# Patient Record
Sex: Male | Born: 1950
Health system: Southern US, Community
[De-identification: ages and names within clinical notes are randomized; demographics above are authoritative.]

## PROBLEM LIST (undated history)

## (undated) DIAGNOSIS — I639 Cerebral infarction, unspecified: Secondary | ICD-10-CM

## (undated) DIAGNOSIS — K635 Polyp of colon: Secondary | ICD-10-CM

## (undated) DIAGNOSIS — J439 Emphysema, unspecified: Secondary | ICD-10-CM

## (undated) DIAGNOSIS — E785 Hyperlipidemia, unspecified: Secondary | ICD-10-CM

## (undated) DIAGNOSIS — T7840XA Allergy, unspecified, initial encounter: Secondary | ICD-10-CM

## (undated) DIAGNOSIS — I10 Essential (primary) hypertension: Secondary | ICD-10-CM

## (undated) HISTORY — DX: Allergy, unspecified, initial encounter: T78.40XA

## (undated) HISTORY — DX: Hyperlipidemia, unspecified: E78.5

## (undated) HISTORY — DX: Cerebral infarction, unspecified: I63.9

## (undated) HISTORY — DX: Emphysema, unspecified: J43.9

## (undated) HISTORY — DX: Polyp of colon: K63.5

---

## 2001-02-10 ENCOUNTER — Emergency Department (HOSPITAL_COMMUNITY): Admission: EM | Admit: 2001-02-10 | Discharge: 2001-02-10 | Payer: Self-pay | Admitting: *Deleted

## 2007-02-08 ENCOUNTER — Emergency Department (HOSPITAL_COMMUNITY): Admission: EM | Admit: 2007-02-08 | Discharge: 2007-02-08 | Payer: Self-pay | Admitting: Emergency Medicine
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
## 2008-06-28 HISTORY — PX: COLONOSCOPY: SHX174

## 2008-10-14 ENCOUNTER — Ambulatory Visit: Payer: Self-pay | Admitting: Gastroenterology

## 2012-02-28 ENCOUNTER — Encounter (HOSPITAL_COMMUNITY): Payer: Self-pay | Admitting: *Deleted

## 2012-02-28 ENCOUNTER — Emergency Department (HOSPITAL_COMMUNITY)
Admission: EM | Admit: 2012-02-28 | Discharge: 2012-02-29 | Disposition: A | Discharge status: Home / Self Care | Payer: 59 | Attending: Emergency Medicine | Admitting: Emergency Medicine

## 2012-02-28 DIAGNOSIS — I10 Essential (primary) hypertension: Secondary | ICD-10-CM | POA: Insufficient documentation

## 2012-02-28 DIAGNOSIS — R040 Epistaxis: Secondary | ICD-10-CM | POA: Insufficient documentation

## 2012-02-28 DIAGNOSIS — F17200 Nicotine dependence, unspecified, uncomplicated: Secondary | ICD-10-CM | POA: Insufficient documentation

## 2012-02-28 DIAGNOSIS — E119 Type 2 diabetes mellitus without complications: Secondary | ICD-10-CM | POA: Insufficient documentation

## 2012-02-28 HISTORY — DX: Essential (primary) hypertension: I10

## 2012-02-28 LAB — COMPREHENSIVE METABOLIC PANEL
ALT: 67 U/L — ABNORMAL HIGH (ref 0–53)
AST: 43 U/L — ABNORMAL HIGH (ref 0–37)
Albumin: 3.7 g/dL (ref 3.5–5.2)
Alkaline Phosphatase: 86 U/L (ref 39–117)
BUN: 16 mg/dL (ref 6–23)
CO2: 25 mEq/L (ref 19–32)
Calcium: 9.2 mg/dL (ref 8.4–10.5)
Chloride: 102 mEq/L (ref 96–112)
Creatinine, Ser: 1.02 mg/dL (ref 0.50–1.35)
GFR calc Af Amer: 90 mL/min — ABNORMAL LOW (ref >90)
GFR calc non Af Amer: 77 mL/min — ABNORMAL LOW (ref >90)
Glucose, Bld: 119 mg/dL — ABNORMAL HIGH (ref 70–99)
Potassium: 4.3 mEq/L (ref 3.5–5.1)
Sodium: 135 mEq/L (ref 135–145)
Total Bilirubin: 0.4 mg/dL (ref 0.3–1.2)
Total Protein: 7.2 g/dL (ref 6.0–8.3)

## 2012-02-28 LAB — CBC WITH DIFFERENTIAL/PLATELET
Basophils Absolute: 0 10*3/uL (ref 0.0–0.1)
Basophils Relative: 0 % (ref 0–1)
Eosinophils Absolute: 0.1 10*3/uL (ref 0.0–0.7)
Eosinophils Relative: 2 % (ref 0–5)
HCT: 45.1 % (ref 39.0–52.0)
Hemoglobin: 14.7 g/dL (ref 13.0–17.0)
Lymphocytes Relative: 32 % (ref 12–46)
Lymphs Abs: 2.2 10*3/uL (ref 0.7–4.0)
MCH: 28.5 pg (ref 26.0–34.0)
MCHC: 32.6 g/dL (ref 30.0–36.0)
MCV: 87.4 fL (ref 78.0–100.0)
Monocytes Absolute: 0.6 10*3/uL (ref 0.1–1.0)
Monocytes Relative: 9 % (ref 3–12)
Neutro Abs: 3.8 10*3/uL (ref 1.7–7.7)
Neutrophils Relative %: 57 % (ref 43–77)
Platelets: 154 10*3/uL (ref 150–400)
RBC: 5.16 MIL/uL (ref 4.22–5.81)
RDW: 13.3 % (ref 11.5–15.5)
WBC: 6.7 10*3/uL (ref 4.0–10.5)

## 2012-02-28 NOTE — ED Notes (Signed)
No epistaxis at this time.

## 2012-02-28 NOTE — ED Notes (Signed)
The pt has had a high a1c in the past.  His blood has not been checked in a long time

## 2012-02-28 NOTE — ED Notes (Signed)
The pt has had intermittent nose bleeds since February.  No bleeding now.

## 2012-02-29 MED ORDER — SALINE NASAL SPRAY 0.65 % NA SOLN: 1.0000 | NASAL | Status: DC | PRN

## 2012-02-29 NOTE — ED Notes (Signed)
Pt c/o intermittent nosebleeds x 2 months.  States he usually gets them 1-2 x week.  Tonight, lasted for 30 minutes and blood was draining down throat, causing him to cough.  No bleeding at this time.  Pt states he does have HTN, not on meds at this time, does not regularly check BP.  Family concerned patient's nosebleeds are related to stress.

## 2012-02-29 NOTE — ED Provider Notes (Signed)
History     CSN: [id number]  Arrival date & time 02/28/12  2150   First MD Initiated Contact with Patient 02/28/12 2328      Chief Complaint  Patient presents with  . Epistaxis    (Consider location/radiation/quality/duration/timing/severity/associated sxs/prior treatment) HPI The patient presents due to concerns of recurrent epistaxis.  He states that over the past 7 months he is to multiple episodes of epistaxis.  Episodes typically last for minutes to hours, resolving spontaneously or with pressure.  Throughout that time.  He notes that these seem to occur frequently while he is traveling, for work, though they occur in all locations. He denies any other events during his, including syncope, chest pain, dyspnea, fever, chills, confusion, visual changes. He has a history of hypertension, for which is not taking medication currently, denies other significant medical issues.  The patient presents today 2 to a prolonged episode that was otherwise characteristically the same.  This episode began in the hours prior to arrival, stopped before my evaluation. Past Medical History  Diagnosis Date  . Hypertension   . Diabetes mellitus     History reviewed. No pertinent past surgical history.  No family history on file.  History  Substance Use Topics  . Smoking status: Current Everyday Smoker  . Smokeless tobacco: Not on file  . Alcohol Use: Yes      Review of Systems  Constitutional:       Per HPI, otherwise negative  HENT:       Per HPI, otherwise negative  Eyes: Negative.   Respiratory:       Per HPI, otherwise negative  Cardiovascular:       Per HPI, otherwise negative  Gastrointestinal: Negative for vomiting.  Genitourinary: Negative.   Musculoskeletal:       Per HPI, otherwise negative  Skin: Negative.   Neurological: Negative for syncope.    Allergies  Review of patient's allergies indicates no known allergies.  Home Medications   Current Outpatient Rx    Name Route Sig Dispense Refill  . ASPIRIN 325 MG PO TABS Oral Take 325 mg by mouth daily.    . OCUVITE PO TABS Oral Take 1 tablet by mouth daily.    [name] KRILL OIL 1000 MG PO CAPS Oral Take 1 capsule by mouth daily.    [name] PLANT STEROLS AND STANOLS 450 MG PO TABS Oral Take 1 tablet by mouth 2 (two) times daily.    [name] SALINE NASAL SPRAY 0.65 % NA SOLN Nasal Place 1 spray into the nose as needed for congestion. 30 mL 12    BP 139/83  Pulse 82  Temp 98.3 F (36.8 C) (Oral)  Resp 18  SpO2 100%  Physical Exam  Nursing note and vitals reviewed. Constitutional: He is oriented to person, place, and time. He appears well-developed. No distress.  HENT:  Head: Normocephalic and atraumatic.  Nose: No mucosal edema, rhinorrhea, sinus tenderness, nasal deformity, septal deviation or nasal septal hematoma. No epistaxis.       Within the nostrils there is no active bleeding.  On the left on the medial distal most visible aspect there is a scabbed over lesion.  On the right on the medial anterior, there is a scant amount of dried blood.  Eyes: Conjunctivae and EOM are normal.  Cardiovascular: Normal rate and regular rhythm.   Pulmonary/Chest: Effort normal. No stridor. No respiratory distress.  Abdominal: He exhibits no distension.  Musculoskeletal: He exhibits no edema.  Neurological: He is alert and  oriented to person, place, and time.  Skin: Skin is warm and dry.  Psychiatric: He has a normal mood and affect.    ED Course  Procedures (including critical care time)  Labs Reviewed  COMPREHENSIVE METABOLIC PANEL - Abnormal; Notable for the following:    Glucose, Bld 119 (*)     AST 43 (*)     ALT 67 (*)     GFR calc non Af Amer 77 (*)     GFR calc Af Amer 90 (*)     All other components within normal limits  CBC WITH DIFFERENTIAL  PROTIME-INR   No results found.   1. Epistaxis       MDM  This generally well-appearing male presents with concern of months of nosebleed, and a new,  prolonged episode just prior to arrival.  On arrival the patient's bleeding has stopped, and on exam he is no active bleeding.  Given the patient's description of chronic bleeds, there is some suspicion of hypertension or coagulopathy, though the patient's labs are largely reassuring.  We discussed at length the necessity for continued management with ENT, preventative methods for epistaxis control, and the patient was discharged in stable condition as he was traveling later this morning.  He was provided saline drops for prophylaxis while flying.     Gerhard Munch, MD 02/29/12 [phone]

## 2012-02-29 NOTE — ED Notes (Signed)
Rx x 1.  Pt voiced understanding to f/u with PCP and ENT and return for worsening condition.

## 2013-01-11 ENCOUNTER — Emergency Department (HOSPITAL_COMMUNITY)
Admission: EM | Admit: 2013-01-11 | Discharge: 2013-01-11 | Disposition: A | Discharge status: Home / Self Care | Payer: 59 | Attending: Emergency Medicine | Admitting: Emergency Medicine

## 2013-01-11 ENCOUNTER — Encounter (HOSPITAL_COMMUNITY): Payer: Self-pay

## 2013-01-11 ENCOUNTER — Ambulatory Visit (INDEPENDENT_AMBULATORY_CARE_PROVIDER_SITE_OTHER): Payer: 59 | Admitting: Emergency Medicine

## 2013-01-11 VITALS — BP 156/100 | HR 94 | Temp 97.8°F | Resp 20 | Ht 68.0 in | Wt 215.4 lb

## 2013-01-11 DIAGNOSIS — R51 Headache: Secondary | ICD-10-CM | POA: Insufficient documentation

## 2013-01-11 DIAGNOSIS — I10 Essential (primary) hypertension: Secondary | ICD-10-CM | POA: Insufficient documentation

## 2013-01-11 DIAGNOSIS — Z79899 Other long term (current) drug therapy: Secondary | ICD-10-CM | POA: Insufficient documentation

## 2013-01-11 DIAGNOSIS — H538 Other visual disturbances: Secondary | ICD-10-CM | POA: Insufficient documentation

## 2013-01-11 DIAGNOSIS — R040 Epistaxis: Secondary | ICD-10-CM

## 2013-01-11 DIAGNOSIS — E669 Obesity, unspecified: Secondary | ICD-10-CM

## 2013-01-11 DIAGNOSIS — F17200 Nicotine dependence, unspecified, uncomplicated: Secondary | ICD-10-CM | POA: Insufficient documentation

## 2013-01-11 DIAGNOSIS — E1169 Type 2 diabetes mellitus with other specified complication: Secondary | ICD-10-CM | POA: Insufficient documentation

## 2013-01-11 DIAGNOSIS — E119 Type 2 diabetes mellitus without complications: Secondary | ICD-10-CM

## 2013-01-11 DIAGNOSIS — Z7982 Long term (current) use of aspirin: Secondary | ICD-10-CM | POA: Insufficient documentation

## 2013-01-11 DIAGNOSIS — E291 Testicular hypofunction: Secondary | ICD-10-CM

## 2013-01-11 DIAGNOSIS — E782 Mixed hyperlipidemia: Secondary | ICD-10-CM

## 2013-01-11 DIAGNOSIS — M7090 Unspecified soft tissue disorder related to use, overuse and pressure of unspecified site: Secondary | ICD-10-CM

## 2013-01-11 DIAGNOSIS — R739 Hyperglycemia, unspecified: Secondary | ICD-10-CM

## 2013-01-11 LAB — LIPID PANEL
Cholesterol: 236 mg/dL — ABNORMAL HIGH (ref 0–200)
HDL: 41 mg/dL (ref >39)
Total CHOL/HDL Ratio: 5.8 Ratio
Triglycerides: 134 mg/dL (ref <150)

## 2013-01-11 LAB — COMPREHENSIVE METABOLIC PANEL
AST: 64 U/L — ABNORMAL HIGH (ref 0–37)
BUN: 12 mg/dL (ref 6–23)
Calcium: 9.5 mg/dL (ref 8.4–10.5)
Chloride: 100 mEq/L (ref 96–112)
Creat: 0.96 mg/dL (ref 0.50–1.35)
Glucose, Bld: 167 mg/dL — ABNORMAL HIGH (ref 70–99)

## 2013-01-11 LAB — POCT CBC
Lymph, poc: 2 (ref 0.6–3.4)
MCH, POC: 28.4 pg (ref 27–31.2)
MCHC: 31.3 g/dL — AB (ref 31.8–35.4)
MID (cbc): 0.5 (ref 0–0.9)
MPV: 11.7 fL (ref 0–99.8)
POC MID %: 8.9 %M (ref 0–12)
Platelet Count, POC: 140 10*3/uL — AB (ref 142–424)
WBC: 6.1 10*3/uL (ref 4.6–10.2)

## 2013-01-11 LAB — POCT URINALYSIS DIPSTICK
Bilirubin, UA: NEGATIVE
Nitrite, UA: NEGATIVE
Spec Grav, UA: <=1.005
Urobilinogen, UA: 1
pH, UA: 5.5

## 2013-01-11 LAB — TESTOSTERONE: Testosterone: 250 ng/dL — ABNORMAL LOW (ref 300–890)

## 2013-01-11 LAB — GLUCOSE, CAPILLARY

## 2013-01-11 MED ORDER — TESTOSTERONE 20.25 MG/1.25GM (1.62%) TD GEL: 4.0000 | Freq: Every day | TRANSDERMAL | Status: DC

## 2013-01-11 MED ORDER — ROSUVASTATIN CALCIUM 20 MG PO TABS: 20.0000 mg | ORAL_TABLET | Freq: Every day | ORAL | Status: DC

## 2013-01-11 MED ORDER — METFORMIN HCL ER 500 MG PO TB24: ORAL_TABLET | ORAL | Status: DC

## 2013-01-11 MED ORDER — LISINOPRIL 20 MG PO TABS: 20.0000 mg | ORAL_TABLET | Freq: Every day | ORAL | Status: DC

## 2013-01-11 NOTE — ED Provider Notes (Signed)
History    CSN: [id number] Arrival date & time 01/11/13  1225  First MD Initiated Contact with Patient 01/11/13 1244     Chief Complaint  Patient presents with  . Dehydration   (Consider location/radiation/quality/duration/timing/severity/associated sxs/prior Treatment) HPI Pt seen in urgent care for epistaxis and started on lisinopril due to elevated BP. Pt states he does have PMD but has not followed up with him in several years and has gained weight. Pt get intermittent R sided HA with blurred vision. Staes HA are mild and had one this afternoon that is all but resolved at this time. No dizziness, N/V, fever chills, CP, sob, focal weakness. Pt took BP med earlier today. Urinating normally.  Past Medical History  Diagnosis Date  . Hypertension   . Diabetes mellitus   . Allergy    History reviewed. No pertinent past surgical history. No family history on file. History  Substance Use Topics  . Smoking status: Current Every Day Smoker -- 0.50 packs/day for 40 years    Types: Cigarettes  . Smokeless tobacco: Not on file  . Alcohol Use: Yes    Review of Systems  Constitutional: Negative for fever and chills.  HENT: Negative for neck pain.   Eyes: Positive for visual disturbance.  Respiratory: Negative for chest tightness and shortness of breath.   Cardiovascular: Negative for chest pain.  Gastrointestinal: Negative for nausea, vomiting, abdominal pain, diarrhea and abdominal distention.  Genitourinary: Negative for dysuria, frequency and difficulty urinating.  Musculoskeletal: Negative for back pain.  Skin: Negative for rash and wound.  Neurological: Positive for headaches. Negative for dizziness, syncope, weakness, light-headedness and numbness.  All other systems reviewed and are negative.    Allergies  Review of patient's allergies indicates no known allergies.  Home Medications   Current Outpatient Rx  Name  Route  Sig  Dispense  Refill  . aspirin 81 MG tablet   Oral   Take 81 mg by mouth daily.         . beta carotene w/minerals (OCUVITE) tablet   Oral   Take 1 tablet by mouth daily.         [name] KRILL OIL 1000 MG CAPS   Oral   Take 1 capsule by mouth daily.         [name] lisinopril (PRINIVIL,ZESTRIL) 20 MG tablet   Oral   Take 1 tablet (20 mg total) by mouth daily.   30 tablet   1   . Plant Sterols and Stanols (CHOLEST OFF) 450 MG TABS   Oral   Take 1 tablet by mouth 2 (two) times daily.         . sodium chloride (OCEAN NASAL SPRAY) 0.65 % nasal spray   Nasal   Place 1 spray into the nose as needed for congestion.   30 mL   12    BP 170/100  Pulse 115  Temp(Src) 98.1 F (36.7 C) (Oral)  Resp 20  SpO2 97% Physical Exam  Nursing note and vitals reviewed. Constitutional: He is oriented to person, place, and time. He appears well-developed and well-nourished. No distress.  HENT:  Head: Normocephalic and atraumatic.  Mouth/Throat: Oropharynx is clear and moist.  Eyes: EOM are normal. Pupils are equal, round, and reactive to light.  Neck: Normal range of motion. Neck supple.  Cardiovascular: Normal rate and regular rhythm.   Pulmonary/Chest: Effort normal and breath sounds normal. No respiratory distress. He has no wheezes. He has no rales.  Abdominal: Soft. Bowel sounds  are normal. He exhibits no distension and no mass. There is no tenderness. There is no rebound and no guarding.  Musculoskeletal: Normal range of motion. He exhibits no edema and no tenderness.  Neurological: He is alert and oriented to person, place, and time.  5/5 motor in all ext, sensation intact, normal gait.   Skin: Skin is warm and dry. No rash noted. No erythema.  Psychiatric: He has a normal mood and affect. His behavior is normal.    ED Course  Procedures (including critical care time) Labs Reviewed  GLUCOSE, CAPILLARY - Abnormal; Notable for the following:    Glucose-Capillary 269 (*)    All other components within normal limits   No  results found. 1. Hypertension   2. Hyperglycemia   3. Headache     MDM  Stressed to pt need for outpt follow up with PMD for blood pressure and blood glucose management. Pt has been given return precautions. SBP in 140's and decreased HR below 100. Pt does not appear to be clinically dehydrated. Encouraged staying well hydrated drinking plenty of fluids, weight loss and exercise. Return precautions given.   Loren Racer, MD 01/11/13 1324

## 2013-01-11 NOTE — Progress Notes (Signed)
  Subjective:    Patient ID: Robert Robertson, male    DOB: May 25, 1951, 62 y.o.   MRN: [id number]  HPI    Review of Systems     Objective:   Physical Exam        Assessment & Plan:

## 2013-01-11 NOTE — Patient Instructions (Signed)
Nosebleed  Nosebleeds can be caused by many conditions including trauma, infections, polyps, foreign bodies, dry mucous membranes or climate, medications and air conditioning. Most nosebleeds occur in the front of the nose. It is because of this location that most nosebleeds can be controlled by pinching the nostrils gently and continuously. Do this for at least 10 to 20 minutes. The reason for this long continuous pressure is that you must hold it long enough for the blood to clot. If during that 10 to 20 minute time period, pressure is released, the process may have to be started again. The nosebleed may stop by itself, quit with pressure, need concentrated heating (cautery) or stop with pressure from packing.  HOME CARE INSTRUCTIONS    If your nose was packed, try to maintain the pack inside until your caregiver removes it. If a gauze pack was used and it starts to fall out, gently replace or cut the end off. Do not cut if a balloon catheter was used to pack the nose. Otherwise, do not remove unless instructed.   Avoid blowing your nose for 12 hours after treatment. This could dislodge the pack or clot and start bleeding again.   If the bleeding starts again, sit up and bending forward, gently pinch the front half of your nose continuously for 20 minutes.   If bleeding was caused by dry mucous membranes, cover the inside of your nose every morning with a petroleum or antibiotic ointment. Use your little fingertip as an applicator. Do this as needed during dry weather. This will keep the mucous membranes moist and allow them to heal.   Maintain humidity in your home by using less air conditioning or using a humidifier.   Do not use aspirin or medications which make bleeding more likely. Your caregiver can give you recommendations on this.   Resume normal activities as able but try to avoid straining, lifting or bending at the waist for several days.   If the nosebleeds become recurrent and the cause is  unknown, your caregiver may suggest laboratory tests.  SEEK IMMEDIATE MEDICAL CARE IF:    Bleeding recurs and cannot be controlled.   There is unusual bleeding from or bruising on other parts of the body.   You have a fever.   Nosebleeds continue.   There is any worsening of the condition which originally brought you in.   You become lightheaded, feel faint, become sweaty or vomit blood.  MAKE SURE YOU:    Understand these instructions.   Will watch your condition.   Will get help right away if you are not doing well or get worse.  Document Released: 03/24/2005 Document Revised: 09/06/2011 Document Reviewed: 05/16/2009  Exit[employer] Patient Information 2014 [employer]  Hypertension  As your heart beats, it forces blood through your arteries. This force is your blood pressure. If the pressure is too high, it is called hypertension (HTN) or high blood pressure. HTN is dangerous because you may have it and not know it. High blood pressure may mean that your heart has to work harder to pump blood. Your arteries may be narrow or stiff. The extra work puts you at risk for heart disease, stroke, and other problems.   Blood pressure consists of two numbers, a higher number over a lower, 110/72, for example. It is stated as "110 over 72." The ideal is below 120 for the top number (systolic) and under 80 for the bottom (diastolic). Write down your blood pressure today.  You   should pay close attention to your blood pressure if you have certain conditions such as:   Heart failure.   Prior heart attack.   Diabetes   Chronic kidney disease.   Prior stroke.   Multiple risk factors for heart disease.  To see if you have HTN, your blood pressure should be measured while you are seated with your arm held at the level of the heart. It should be measured at least twice. A one-time elevated blood pressure reading (especially in the Emergency Department) does not mean that you need treatment. There may be conditions in  which the blood pressure is different between your right and left arms. It is important to see your caregiver soon for a recheck.  Most people have essential hypertension which means that there is not a specific cause. This type of high blood pressure may be lowered by changing lifestyle factors such as:   Stress.   Smoking.   Lack of exercise.   Excessive weight.   Drug/tobacco/alcohol use.   Eating less salt.  Most people do not have symptoms from high blood pressure until it has caused damage to the body. Effective treatment can often prevent, delay or reduce that damage.  TREATMENT   When a cause has been identified, treatment for high blood pressure is directed at the cause. There are a large number of medications to treat HTN. These fall into several categories, and your caregiver will help you select the medicines that are best for you. Medications may have side effects. You should review side effects with your caregiver.  If your blood pressure stays high after you have made lifestyle changes or started on medicines,    Your medication(s) may need to be changed.   Other problems may need to be addressed.   Be certain you understand your prescriptions, and know how and when to take your medicine.   Be sure to follow up with your caregiver within the time frame advised (usually within two weeks) to have your blood pressure rechecked and to review your medications.   If you are taking more than one medicine to lower your blood pressure, make sure you know how and at what times they should be taken. Taking two medicines at the same time can result in blood pressure that is too low.  SEEK IMMEDIATE MEDICAL CARE IF:   You develop a severe headache, blurred or changing vision, or confusion.   You have unusual weakness or numbness, or a faint feeling.   You have severe chest or abdominal pain, vomiting, or breathing problems.  MAKE SURE YOU:    Understand these instructions.   Will watch your  condition.   Will get help right away if you are not doing well or get worse.  Document Released: 06/14/2005 Document Revised: 09/06/2011 Document Reviewed: 02/02/2008  [employer] Patient Information 2014 [employer]

## 2013-01-11 NOTE — ED Notes (Signed)
Pt reports went to urgent care for c/o intermittent h/a, blurred vision, nosebleeds & was Dx with HTN. States took BP med at [facility]. Here in ED because h/a & vision problems continue but are improving

## 2013-01-11 NOTE — ED Notes (Signed)
Pt. Went to Urgent Care this am for nose bleed, which is controlled.  Pt. Was also placed on BP meds today.   When pt. Got home he began having  Rt. Side headache and blurred vision.  Pt. Reports that these symptoms begin when he is dry and if he is rehydrated he feels better.  Pt. Denies any n/v/d.   No weakness noted or facail droop. Speech is clear.

## 2013-01-11 NOTE — Progress Notes (Addendum)
[facility] [address], [place] [address] [phone]  Date:  01/11/2013   Name:  Robert Robertson   DOB:  08/20/1950   MRN:  [id number]  PCP:  Alan Mulder, MD    Chief Complaint: Epistaxis   History of Present Illness:  Robert Robertson is a 62 y.o. very pleasant male patient who presents with the following:  1-2 year long history of recurrent epistaxis on LEFT side predominantly.  Seen at least twice in ER and once by an ENT specialist for same over last year.  Had most bleed today and another earlier in the week.  Most recent two bleeds not associated with manipulation.   Not taking medications and no history of recent coryza, fever or chills.  Smokes.  No history of trauma.  No improvement with over the counter medications or other home remedies. Denies other complaint or health concern today.  Was under treatment for DM, hypertension, and hyperlipidemia in past.  No recent treatment over past two years.    There are no active problems to display for this patient.   Past Medical History  Diagnosis Date  . Hypertension   . Diabetes mellitus   . Allergy     History reviewed. No pertinent past surgical history.  History  Substance Use Topics  . Smoking status: Current Every Day Smoker -- 0.50 packs/day for 40 years    Types: Cigarettes  . Smokeless tobacco: Not on file  . Alcohol Use: Yes    History reviewed. No pertinent family history.  No Known Allergies  Medication list has been reviewed and updated.  Current Outpatient Prescriptions on File Prior to Visit  Medication Sig Dispense Refill  . beta carotene w/minerals (OCUVITE) tablet Take 1 tablet by mouth daily.      [name] KRILL OIL 1000 MG CAPS Take 1 capsule by mouth daily.      . Plant Sterols and Stanols (CHOLEST OFF) 450 MG TABS Take 1 tablet by mouth 2 (two) times daily.      . sodium chloride (OCEAN NASAL SPRAY) 0.65 % nasal spray Place 1 spray into the nose as needed for congestion.   30 mL  12   No current facility-administered medications on file prior to visit.    Review of Systems: As per HPI, otherwise negative.    Physical Examination: Filed Vitals:   01/11/13 0917  BP: 156/100  Pulse: 94  Temp: 97.8 F (36.6 C)  Resp: 20   Filed Vitals:   01/11/13 0917  Height: 5\' 8"  (1.727 m)  Weight: 215 lb 6.4 oz (97.705 kg)   Body mass index is 32.76 kg/(m^2). Ideal Body Weight: Weight in (lb) to have BMI = 25: 164.1   GEN: WDWN, NAD, Non-toxic, A & O x 3 HEENT: Atraumatic, Normocephalic. Neck supple. No masses, No LAD. Ears and Nose: No external deformity.  Right clots.  No active bleeding CV: RRR, No M/G/R. No JVD. No thrill. No extra heart sounds. PULM: CTA B, no wheezes, crackles, rhonchi. No retractions. No resp. distress. No accessory muscle use. ABD: S, NT, ND, +BS. No rebound. No HSM. EXTR: No c/c/e NEURO Normal gait.  PSYCH: Normally interactive. Conversant. Not depressed or anxious appearing.  Calm demeanor.    Assessment and Plan: Hypertension Obesity Nose bleed Labs ENT referral Lipids high Sugar high Testosterone low Meds Follow up in three months   Signed,  Phillips Odor, MD   Results for orders placed in visit on 01/11/13  COMPREHENSIVE METABOLIC PANEL      Result Value Range   Sodium 135  135 - 145 mEq/L   Potassium 4.2  3.5 - 5.3 mEq/L   Chloride 100  96 - 112 mEq/L   CO2 26  19 - 32 mEq/L   Glucose, Bld 167 (*) 70 - 99 mg/dL   BUN 12  6 - 23 mg/dL   Creat 1.61  0.96 - 0.45 mg/dL   Total Bilirubin 0.9  0.3 - 1.2 mg/dL   Alkaline Phosphatase 107  39 - 117 U/L   AST 64 (*) 0 - 37 U/L   ALT 121 (*) 0 - 53 U/L   Total Protein 7.2  6.0 - 8.3 g/dL   Albumin 4.3  3.5 - 5.2 g/dL   Calcium 9.5  8.4 - 40.9 mg/dL  LIPID PANEL      Result Value Range   Cholesterol 236 (*) 0 - 200 mg/dL   Triglycerides 811  <914 mg/dL   HDL 41  >78 mg/dL   Total CHOL/HDL Ratio 5.8     VLDL 27  0 - 40 mg/dL   LDL Cholesterol 295 (*) 0  - 99 mg/dL  TESTOSTERONE      Result Value Range   Testosterone 250 (*) 300 - 890 ng/dL  POCT CBC      Result Value Range   WBC 6.1  4.6 - 10.2 K/uL   Lymph, poc 2.0  0.6 - 3.4   POC LYMPH PERCENT 33.5  10 - 50 %L   MID (cbc) 0.5  0 - 0.9   POC MID % 8.9  0 - 12 %M   POC Granulocyte 3.5  2 - 6.9   Granulocyte percent 57.6  37 - 80 %G   RBC 5.28  4.69 - 6.13 M/uL   Hemoglobin 15.0  14.1 - 18.1 g/dL   HCT, POC 62.1  30.8 - 53.7 %   MCV 90.9  80 - 97 fL   MCH, POC 28.4  27 - 31.2 pg   MCHC 31.3 (*) 31.8 - 35.4 g/dL   RDW, POC 65.7     Platelet Count, POC 140 (*) 142 - 424 K/uL   MPV 11.7  0 - 99.8 fL  POCT URINALYSIS DIPSTICK      Result Value Range   Color, UA yellow     Clarity, UA clear     Glucose, UA neg     Bilirubin, UA neg     Ketones, UA neg     Spec Grav, UA <=1.005     Blood, UA trace-intact     pH, UA 5.5     Protein, UA neg     Urobilinogen, UA 1.0     Nitrite, UA neg     Leukocytes, UA Trace

## 2013-01-11 NOTE — Addendum Note (Signed)
Addended by: Carmelina Dane on: 01/11/2013 03:54 PM   Modules accepted: Orders

## 2013-01-12 ENCOUNTER — Telehealth: Payer: Self-pay | Admitting: Radiology

## 2013-01-12 MED ORDER — ROSUVASTATIN CALCIUM 20 MG PO TABS: 20.0000 mg | ORAL_TABLET | Freq: Every day | ORAL | Status: DC

## 2013-01-12 MED ORDER — LISINOPRIL 20 MG PO TABS: 20.0000 mg | ORAL_TABLET | Freq: Every day | ORAL | Status: DC

## 2013-01-12 MED ORDER — METFORMIN HCL ER 500 MG PO TB24: ORAL_TABLET | ORAL | Status: DC

## 2013-01-12 NOTE — Telephone Encounter (Signed)
Patients BP meds cholesterol meds and diabetes meds have been resubmitted, Dr Dareen Piano DEA is expired, pharmacy has advised. Is it okay for me to call in the Androgel with one of our PA name attached to it, want to ask since this is controlled. Please advise.

## 2013-01-12 NOTE — Telephone Encounter (Signed)
Ok to call in Androgel - can use my name.  Pt will need to follow-up with Dr. Dareen Piano for further refills though.

## 2013-01-13 NOTE — Telephone Encounter (Signed)
Called in new RX with Lanora Manis as the pre scriber. Pt aware.

## 2013-05-08 ENCOUNTER — Encounter: Payer: Self-pay | Admitting: Family Medicine

## 2013-07-04 ENCOUNTER — Telehealth: Payer: Self-pay | Admitting: Radiology

## 2013-07-04 MED ORDER — LISINOPRIL 20 MG PO TABS: 20.0000 mg | ORAL_TABLET | Freq: Every day | ORAL | Status: DC

## 2013-07-04 NOTE — Telephone Encounter (Signed)
Phone call from pharmacy. They can not send request electronically for Lisinopril, I have sent, patient needs follow up

## 2014-04-01 ENCOUNTER — Telehealth: Payer: Self-pay

## 2014-04-01 NOTE — Telephone Encounter (Signed)
The patient called stating his wife is a patient and he is hoping to be a new patient of Dr.Tullo's  Is this okay?

## 2014-04-02 NOTE — Telephone Encounter (Signed)
Yes,  Please be advised that family members of established patients are always accepted by me.

## 2014-05-08 ENCOUNTER — Encounter: Payer: Self-pay | Admitting: Internal Medicine

## 2014-05-08 ENCOUNTER — Ambulatory Visit (INDEPENDENT_AMBULATORY_CARE_PROVIDER_SITE_OTHER): Admitting: *Deleted

## 2014-05-08 ENCOUNTER — Ambulatory Visit (INDEPENDENT_AMBULATORY_CARE_PROVIDER_SITE_OTHER): Admitting: Internal Medicine

## 2014-05-08 VITALS — BP 150/100 | HR 96 | Temp 99.0°F | Resp 16 | Ht 68.25 in | Wt 196.2 lb

## 2014-05-08 DIAGNOSIS — E119 Type 2 diabetes mellitus without complications: Secondary | ICD-10-CM

## 2014-05-08 DIAGNOSIS — Z716 Tobacco abuse counseling: Secondary | ICD-10-CM

## 2014-05-08 DIAGNOSIS — Z23 Encounter for immunization: Secondary | ICD-10-CM

## 2014-05-08 DIAGNOSIS — I10 Essential (primary) hypertension: Secondary | ICD-10-CM

## 2014-05-08 DIAGNOSIS — Z720 Tobacco use: Secondary | ICD-10-CM

## 2014-05-08 DIAGNOSIS — Z125 Encounter for screening for malignant neoplasm of prostate: Secondary | ICD-10-CM

## 2014-05-08 DIAGNOSIS — D126 Benign neoplasm of colon, unspecified: Secondary | ICD-10-CM

## 2014-05-08 NOTE — Progress Notes (Signed)
Patient ID: Robert Robertson, male   DOB: Jul 02, 1950, 62 y.o.   MRN: [id number]   Patient Active Problem List   Diagnosis Date Noted  . Essential hypertension 05/11/2014  . Diabetes mellitus type 2 in nonobese 05/11/2014  . Tubular adenoma of colon 05/11/2014  . Tobacco abuse 05/11/2014  . Tobacco abuse Robertson 05/11/2014    Subjective:  CC:   Chief Complaint  Patient presents with  . Establish Care    HPI:   Robert Robertson a 63 y.o. male who presents for establishment of care and management of the following medical conditions:  Diabetes mellitus type 2. Treated with diet historically.   never used the metfomrin   Diagnosed when he presented with History of dehydration,  Blurred vision /a1c checked at urgent care nov 2014.   Has run out of all meds for months,  Not checking blood sugars.   Last Robertson exam over 2 years ago .  Referral to Robert Robertson    Has L4 AND L5 DISK DISEASE .  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  Discussed referral locally,  Recommended Robert Robertson  Needs PSA with next visit   Foot exam normal except for toe nail fungus .     '     Past Medical History  Diagnosis Date  . Hypertension   . Diabetes mellitus   . Allergy        @ALL @  History reviewed. No pertinent past surgical history.  History   Social History  . Marital Status: Married    Spouse Name: N/A    Number of Children: N/A  . Years of Education: N/A   Occupational History  . Not on file.   Social History Main Topics  . Smoking status: Current Every Day Smoker -- 0.50 packs/day for 40 years    Types: Cigarettes  . Smokeless tobacco: Not on file  . Alcohol Use: Yes  . Drug Use: No  . Sexual Activity: Not on file   Other Topics Concern   . Not on file   Social History Narrative    @FAMH @       Review of Systems:   The rest of the review of systems was negative except those addressed in the HPI.      Objective:  BP 150/100 mmHg  Pulse 96  Temp(Src) 99 F (37.2 C) (Oral)  Resp 16  Ht 5' 8.25" (1.734 m)  Wt 196 lb 4 oz (89.018 kg)  BMI 29.61 kg/m2  SpO2 98%  General appearance: alert, cooperative and appears stated age Ears: normal TM's and external ear canals both ears Throat: lips, mucosa, and tongue normal; teeth and gums normal Neck: no adenopathy, no carotid bruit, supple, symmetrical, trachea midline and thyroid not enlarged, symmetric, no tenderness/mass/nodules Back: symmetric, no curvature. ROM normal. No CVA tenderness. Lungs: clear to auscultation bilaterally Heart: regular rate and rhythm, S1, S2 normal, no murmur, click, rub or gallop Abdomen: soft, non-tender; bowel sounds normal; no masses,  no organomegaly Pulses: 2+ and symmetric Skin: Skin color, texture, turgor normal. No rashes or lesions Lymph nodes: Cervical, supraclavicular, and axillary nodes normal.  Assessment and Plan:  Essential hypertension Given his history of DM, will  resume lisinopril pending evaluation of renal function, potassium and urine.   Tubular adenoma of colon Presumed, based on patient's recall of need for 3 year follow up which was due in 2014.  Referral to Robert Robertson  Diabetes mellitus type 2 in nonobese Historically well-controlled on diet alone .  hemoglobin A1c has not been checked in a year.   Patient is NOT up-to-date on Robertson exams and foot exam is notable for onychomycosis, only.  Patient is due for urine microalbuminuria to creatinine ratio , ACE/ARB for management of concurrent hypertension and mitigation of any anticipated proteinuria.  Current labs are pending  Referral to Robert Robertson Smoking cessation instruction/Robertson given:  counseled patient on the  dangers of tobacco use, advised patient to stop smoking, and reviewed strategies to maximize success    Updated Medication List Outpatient Encounter Prescriptions as of 05/08/2014  Medication Sig  . aspirin 81 MG tablet Take 81 mg by mouth daily.  . beta carotene w/minerals (OCUVITE) tablet Take 1 tablet by mouth daily.  . Garlic 6283 MG TABS Take 1 tablet by mouth daily.  . Ginkgo Biloba 40 MG TABS Take 120 mg by mouth 2 (two) times daily.  [name] KRILL OIL 1000 MG CAPS Take 1 capsule by mouth daily.  . Selenium 200 MCG TABS Take 200 mcg by mouth daily.  [name] lisinopril (PRINIVIL,ZESTRIL) 10 MG tablet Take 1 tablet (10 mg total) by mouth daily.  . Plant Sterols and Stanols (CHOLEST OFF) 450 MG TABS Take 2 tablets by mouth every morning.   . [DISCONTINUED] lisinopril (PRINIVIL,ZESTRIL) 20 MG tablet Take 1 tablet (20 mg total) by mouth daily.  . [DISCONTINUED] metFORMIN (GLUCOPHAGE XR) 500 MG 24 hr tablet 1 tab with evening meal for one week.  Increase weekly until 4 daily  . [DISCONTINUED] rosuvastatin (CRESTOR) 20 MG tablet Take 1 tablet (20 mg total) by mouth daily.  . [DISCONTINUED] Testosterone (ANDROGEL) 20.25 MG/1.25GM (1.62%) GEL Place 4 Squirts onto the skin daily.     Orders Placed This Encounter  Procedures  . Pneumococcal polysaccharide vaccine 23-valent greater than or equal to 2yo subcutaneous/IM  . Comprehensive metabolic panel  . Hemoglobin A1c  . Lipid panel  . Microalbumin / creatinine urine ratio  . PSA  . PSA  . Ambulatory referral to Ophthalmology  . Ambulatory referral to General Surgery    Return in about 3 months (around 08/08/2014) for follow up diabetes.

## 2014-05-08 NOTE — Patient Instructions (Signed)
It was a pleasure meeting  You  Please return for fasting  labs as soon as possible.  Please keep a log  of the next  week of blood sugars .  Check it once daily.  I am interested in fastings and 2 hour post prandials (after a meal)along with a brief description of the meal content related to the particular blood sugars.  You received the flu and the pneumonia vaccines today   Referral to Dr Job Founds for colonoscopy is in process  Referral to [facility] is in process as well

## 2014-05-11 ENCOUNTER — Encounter: Payer: Self-pay | Admitting: Internal Medicine

## 2014-05-11 DIAGNOSIS — Z87891 Personal history of nicotine dependence: Secondary | ICD-10-CM | POA: Insufficient documentation

## 2014-05-11 DIAGNOSIS — E782 Mixed hyperlipidemia: Secondary | ICD-10-CM | POA: Insufficient documentation

## 2014-05-11 DIAGNOSIS — E119 Type 2 diabetes mellitus without complications: Secondary | ICD-10-CM | POA: Insufficient documentation

## 2014-05-11 DIAGNOSIS — I10 Essential (primary) hypertension: Secondary | ICD-10-CM | POA: Insufficient documentation

## 2014-05-11 DIAGNOSIS — D126 Benign neoplasm of colon, unspecified: Secondary | ICD-10-CM | POA: Insufficient documentation

## 2014-05-11 DIAGNOSIS — Z716 Tobacco abuse counseling: Secondary | ICD-10-CM | POA: Insufficient documentation

## 2014-05-11 DIAGNOSIS — E1169 Type 2 diabetes mellitus with other specified complication: Secondary | ICD-10-CM | POA: Insufficient documentation

## 2014-05-11 MED ORDER — LISINOPRIL 10 MG PO TABS: 10.0000 mg | ORAL_TABLET | Freq: Every day | ORAL | Status: DC

## 2014-05-11 NOTE — Assessment & Plan Note (Signed)
Historically well-controlled on diet alone .  hemoglobin A1c has not been checked in a year.   Patient is NOT up-to-date on eye exams and foot exam is notable for onychomycosis, only.  Patient is due for urine microalbuminuria to creatinine ratio , ACE/ARB for management of concurrent hypertension and mitigation of any anticipated proteinuria.  Current labs are pending  Referral to [facility]

## 2014-05-11 NOTE — Assessment & Plan Note (Signed)
Presumed, based on patient's recall of need for 3 year follow up which was due in 2014.  Referral to [facility]

## 2014-05-11 NOTE — Assessment & Plan Note (Signed)
Smoking cessation instruction/counseling given:  counseled patient on the dangers of tobacco use, advised patient to stop smoking, and reviewed strategies to maximize success 

## 2014-05-11 NOTE — Assessment & Plan Note (Signed)
Given his history of DM, will resume lisinopril pending evaluation of renal function, potassium and urine.

## 2014-05-13 ENCOUNTER — Encounter: Payer: Self-pay | Admitting: *Deleted

## 2014-05-14 ENCOUNTER — Other Ambulatory Visit (INDEPENDENT_AMBULATORY_CARE_PROVIDER_SITE_OTHER)

## 2014-05-14 ENCOUNTER — Telehealth: Payer: Self-pay | Admitting: Internal Medicine

## 2014-05-14 DIAGNOSIS — Z125 Encounter for screening for malignant neoplasm of prostate: Secondary | ICD-10-CM

## 2014-05-14 DIAGNOSIS — E119 Type 2 diabetes mellitus without complications: Secondary | ICD-10-CM

## 2014-05-14 LAB — LIPID PANEL
CHOLESTEROL: 248 mg/dL — AB (ref 0–200)
HDL: 43.9 mg/dL (ref >39.00)
LDL CALC: 182 mg/dL — AB (ref 0–99)
NonHDL: 204.1
TRIGLYCERIDES: 109 mg/dL (ref 0.0–149.0)
Total CHOL/HDL Ratio: 6
VLDL: 21.8 mg/dL (ref 0.0–40.0)

## 2014-05-14 LAB — COMPREHENSIVE METABOLIC PANEL
ALK PHOS: 85 U/L (ref 39–117)
ALT: 74 U/L — ABNORMAL HIGH (ref 0–53)
AST: 67 U/L — AB (ref 0–37)
Albumin: 4.1 g/dL (ref 3.5–5.2)
BILIRUBIN TOTAL: 0.5 mg/dL (ref 0.2–1.2)
BUN: 14 mg/dL (ref 6–23)
CO2: 21 mEq/L (ref 19–32)
CREATININE: 0.9 mg/dL (ref 0.4–1.5)
Calcium: 9.1 mg/dL (ref 8.4–10.5)
Chloride: 107 mEq/L (ref 96–112)
GFR: 109.43 mL/min (ref >60.00)
Glucose, Bld: 172 mg/dL — ABNORMAL HIGH (ref 70–99)
Potassium: 4.5 mEq/L (ref 3.5–5.1)
Sodium: 139 mEq/L (ref 135–145)
Total Protein: 7.1 g/dL (ref 6.0–8.3)

## 2014-05-14 LAB — HEMOGLOBIN A1C: Hgb A1c MFr Bld: 10.1 % — ABNORMAL HIGH (ref 4.6–6.5)

## 2014-05-14 LAB — PSA: PSA: 2 ng/mL (ref 0.10–4.00)

## 2014-05-14 LAB — MICROALBUMIN / CREATININE URINE RATIO
CREATININE, U: 198.7 mg/dL
Microalb Creat Ratio: 1.2 mg/g (ref 0.0–30.0)
Microalb, Ur: 2.3 mg/dL — ABNORMAL HIGH (ref 0.0–1.9)

## 2014-05-14 NOTE — Telephone Encounter (Signed)
emmi emailed °

## 2014-05-16 NOTE — Addendum Note (Signed)
Addended by: [name] on: 05/16/2014 08:01 PM   Modules accepted: Orders

## 2014-05-21 ENCOUNTER — Encounter: Payer: Self-pay | Admitting: Endocrinology

## 2014-05-21 ENCOUNTER — Ambulatory Visit (INDEPENDENT_AMBULATORY_CARE_PROVIDER_SITE_OTHER): Admitting: Endocrinology

## 2014-05-21 VITALS — BP 140/92 | HR 89 | Wt 194.8 lb

## 2014-05-21 DIAGNOSIS — I10 Essential (primary) hypertension: Secondary | ICD-10-CM

## 2014-05-21 DIAGNOSIS — E119 Type 2 diabetes mellitus without complications: Secondary | ICD-10-CM

## 2014-05-21 DIAGNOSIS — E785 Hyperlipidemia, unspecified: Secondary | ICD-10-CM | POA: Insufficient documentation

## 2014-05-21 NOTE — Assessment & Plan Note (Addendum)
Discussed at length that he meets criteria for diabetes given his A1c and sugars. He seems to continue to be in denial as of now, but is willing to accept this diagnosis.   We discussed about ideal sugars and goal A1c.  Discussed at length regarding dietary modifications and carbohydrate and portion control.  We discussed referral to a DM educator and he is agreeable- refer to [facility].   Discussed exercise. Discussed that while Selenium could hep in Dm control - it is not enough given his sugars. Discussed long term risks for uncontrolled diabetes at length and encouraged yearly eye exams.  Foot care discussed.   Discussed that per guidelines- he would need atleast 1 if not 2 medications to control his sugars. He is very reluctant to try any medications at this time, and wants to control his sugars with diet and exercise alone.  He will continue to monitor his sugars 2x daily and return in 2 months to assess the effect of the changes that he has made.   RTC 2 months

## 2014-05-21 NOTE — Patient Instructions (Signed)
Check sugars 2 x daily ( before breakfast and before supper).  Record them in a log book and bring that/meter to next appointment.   Refer to dm education at [facility].  Declining any treatment as of now.  Wants to work on diet and exercise   Please come back for a follow-up appointment in 2 months.

## 2014-05-21 NOTE — Assessment & Plan Note (Addendum)
Lipids not controlled. Has been on statin therapy in the past and tolerated them well. Ran out of RF due to irregular medical follow up. Now wants to improve lipids with diet and exercise for now and reassess in a few months the need for statin therapy. Continue fish oil for now.  Of note, has mild elevations in liver tests as well. Lab Results  Component Value Date   AST 67* 05/14/2014   ALT 74* 05/14/2014

## 2014-05-21 NOTE — Progress Notes (Signed)
Reason for visit-  Robert Robertson is a 63 y.o.-year-old male, referred by his PCP,  [name], MD for management of Type 2 diabetes, uncontrolled, without complications. Recently, established care with Dr Derrel Nip. Was previously seeing Dr Ronnald Collum irregularly for his medical care.    HPI- Patient has been diagnosed with pre-diabetes in ~ 2010. Last year had an ER visit for nose bleed and  hyperglycemia was detected. He was prescribed metformin, which he never took.  Has been on lifestyle modifications so far.   he has not been on insulin before.    Pt is currently on a regimen of: -Selenium 200 mcg tablets    Last hemoglobin A1c was: Lab Results  Component Value Date   HGBA1C 10.1* 05/14/2014     Pt checks his sugars 2 a day . Uses One Touch verio glucometer. By meter download they are: 6 readings  PREMEAL Breakfast Lunch Dinner Bedtime Overall  Glucose range: 143-154--225-205   181-179   Mean/median:        POST-MEAL PC Breakfast PC Lunch PC Dinner  Glucose range:     Mean/median:       Hypoglycemia-  No lows or symptoms thereof recently.   Dietary habits- eats three times daily. Not following any particular diet. Has been having more desserts recently.  Not seen nutrition in the past. Exercise- not doing anything. Is considering starting gym at [facility]. Weight - down since last year, denies symptoms of hyperglycemia Wt Readings from Last 3 Encounters:  05/21/14 194 lb 12 oz (88.338 kg)  05/08/14 196 lb 4 oz (89.018 kg)  01/11/13 215 lb 6.4 oz (97.705 kg)    Diabetes Complications-  Nephropathy- No  CKD, last BUN/creatinine-  Lab Results  Component Value Date   BUN 14 05/14/2014   CREATININE 0.9 05/14/2014   Lab Results  Component Value Date   GFR 109.43 05/14/2014   MICRALBCREAT 1.2 05/14/2014    Retinopathy- No, prior going every 2 years, DEE has been scheduled by Dr. Derrel Nip Neuropathy- no numbness and tingling in his feet. No known neuropathy. Has 2  degenerative disks, causing pain occasionally. Associated history - No CAD . No prior stroke. No hypothyroidism. his last TSH was No results found for: TSH  Hyperlipidemia-  his last set of lipids were- Currently on dietary therapy and garlic and OTC supplements. Has been on Crestor and Lipitor in the past for his lipids on and off over the years.  Lab Results  Component Value Date   CHOL 248* 05/14/2014   HDL 43.90 05/14/2014   LDLCALC 182* 05/14/2014   TRIG 109.0 05/14/2014   CHOLHDL 6 05/14/2014    Blood Pressure/HTN- Patient's blood pressure is not well controlled today on current regimen that includes ACE-I ( Lisinopril). Hasn't taken it today. Recently started by PCP.   Pt has FH of DM in Mother and brother, MGF, MGM. He is a smoker.   I have reviewed the patient's past medical history, family and social history, surgical history, medications and allergies.  Past Medical History  Diagnosis Date  . Hypertension   . Diabetes mellitus   . Allergy    No past surgical history on file. Family History  Problem Relation Age of Onset  . Diabetes Mother   . Stroke Mother   . Diabetes Brother   . Diabetes Maternal Grandfather   . Diabetes Maternal Grandmother    History   Social History  . Marital Status: Married    Spouse Name: N/A  Number of Children: N/A  . Years of Education: N/A   Occupational History  . Not on file.   Social History Main Topics  . Smoking status: Current Every Day Smoker -- 0.50 packs/day for 40 years    Types: Cigarettes  . Smokeless tobacco: Not on file  . Alcohol Use: Yes  . Drug Use: No  . Sexual Activity: Not on file   Other Topics Concern  . Not on file   Social History Narrative   Current Outpatient Prescriptions on File Prior to Visit  Medication Sig Dispense Refill  . aspirin 81 MG tablet Take 81 mg by mouth daily.    . beta carotene w/minerals (OCUVITE) tablet Take 1 tablet by mouth daily.    . Garlic 6294 MG TABS Take 1  tablet by mouth daily.    . Ginkgo Biloba 40 MG TABS Take 120 mg by mouth 2 (two) times daily.    [name] KRILL OIL 1000 MG CAPS Take 1 capsule by mouth daily.    [name] lisinopril (PRINIVIL,ZESTRIL) 10 MG tablet Take 1 tablet (10 mg total) by mouth daily. 90 tablet 3  . Plant Sterols and Stanols (CHOLEST OFF) 450 MG TABS Take 2 tablets by mouth every morning.     . Selenium 200 MCG TABS Take 200 mcg by mouth daily.     No current facility-administered medications on file prior to visit.   Allergies  Allergen Reactions  . Pollen Extract Other (See Comments)    Hay-fever. Reaction: Sneezing     Review of Systems: [x]  complains of  [  ] denies General:   [  ] Recent weight change [  ] Fatigue  [  ] Loss of appetite Eyes: [  ]  Vision Difficulty [  ]  Eye pain ENT: [  ]  Hearing difficulty [  ]  Difficulty Swallowing [facility]: [  ] Chest pain [  ]  Palpitations/Irregular Heart beat [  ]  Shortness of breath lying flat [  ] Swelling of legs Resp: [  ] Frequent Cough [  ] Shortness of Breath  [  ]  Wheezing GI: [  ] Heartburn  [  ] Nausea or Vomiting  [  ] Diarrhea [  ] Constipation  [  ] Abdominal Pain GU: [  ]  Polyuria  [  ]  nocturia Bones/joints:  [  ]  Muscle aches  [  ] Joint Pain  [  ] Bone pain Skin/Hair/Nails: [  ]  Rash  [  ] New stretch marks [  ]  Itching [  ] Hair loss [  ]  Excessive hair growth Reproduction: [  ] Low sexual desire , [  ]  Women: Menstrual cycle problems [  ]  Women: Breast Discharge [  ] Men: Difficulty with erections [  ]  Men: Enlarged Breasts CNS: [  ] Frequent Headaches [  ] Blurry vision [  ] Tremors [  ] Seizures [  ] Loss of consciousness [  ] Localized weakness Endocrine: [  ]  Excess thirst [  ]  Feeling excessively hot [  ]  Feeling excessively cold Heme: [  ]  Easy bruising [  ]  Enlarged glands or lumps in neck Allergy: [  ]  Food allergies [  ] Environmental allergies  PE: BP 140/92 mmHg  Pulse 89  Wt 194 lb 12 oz (88.338 kg)  SpO2 97% Wt Readings from  Last 3 Encounters:  05/21/14  194 lb 12 oz (88.338 kg)  05/08/14 196 lb 4 oz (89.018 kg)  01/11/13 215 lb 6.4 oz (97.705 kg)   GENERAL: No acute distress, well developed HEENT:  Eye exam shows normal external appearance. Oral exam shows normal mucosa .  NECK:   Neck exam shows no lymphadenopathy. No Carotids bruits. Thyroid is not enlarged and no nodules felt.  min acanthosis nigricans LUNGS:         Chest is symmetrical. Lungs are clear to auscultation.[name]   HEART:         Heart sounds:  S1 and S2 are normal. No murmurs or clicks heard. ABDOMEN:  No Distention present. Liver and spleen are not palpable. No other mass or tenderness present.  EXTREMITIES:     There is no edema. 2+ DP pulses  NEUROLOGICAL:     Grossly intact.            Diabetic foot exam deferred as recently done by PCP.  MUSCULOSKELETAL:       There is no enlargement or gross deformity of the joints.  SKIN:       No rash  ASSESSMENT AND PLAN: Problem List Items Addressed This Visit      Cardiovascular and Mediastinum   Essential hypertension    BP not well controlled today. Not taken his medications as yet, which were recently started by his PCP.  Asked him to take it daily.  Recent urine MA normal Nov 2015.     Relevant Orders      Amb Referral to Nutrition and Diabetic E     Endocrine   Diabetes mellitus type 2 in nonobese - Primary    Discussed at length that he meets criteria for diabetes given his A1c and sugars. He seems to continue to be in denial as of now, but is willing to accept this diagnosis.   We discussed about ideal sugars and goal A1c.  Discussed at length regarding dietary modifications and carbohydrate and portion control.  We discussed referral to a DM educator and he is agreeable- refer to [facility].   Discussed exercise. Discussed that while Selenium could hep in Dm control - it is not enough given his sugars. Discussed long term risks for uncontrolled diabetes at length and  encouraged yearly eye exams.  Foot care discussed.   Discussed that per guidelines- he would need atleast 1 if not 2 medications to control his sugars. He is very reluctant to try any medications at this time, and wants to control his sugars with diet and exercise alone.  He will continue to monitor his sugars 2x daily and return in 2 months to assess the effect of the changes that he has made.   RTC 2 months        Relevant Orders      Amb Referral to Nutrition and Diabetic E     Other   Hyperlipidemia    Lipids not controlled. Has been on statin therapy in the past and tolerated them well. Ran out of RF due to irregular medical follow up. Now wants to improve lipids with diet and exercise for now and reassess in a few months the need for statin therapy. Continue fish oil for now.  Of note, has mild elevations in liver tests as well. Lab Results  Component Value Date   AST 67* 05/14/2014   ALT 74* 05/14/2014        Relevant Orders      Amb Referral to Nutrition and  Diabetic E         - Return to clinic in 2 mo with sugar log/meter.  Alric Geise [facility] 05/21/2014 10:33 AM

## 2014-05-21 NOTE — Assessment & Plan Note (Signed)
BP not well controlled today. Not taken his medications as yet, which were recently started by his PCP.  Asked him to take it daily.  Recent urine MA normal Nov 2015.

## 2014-05-21 NOTE — Progress Notes (Signed)
Pre visit review using our clinic review tool, if applicable. No additional management support is needed unless otherwise documented below in the visit note. 

## 2014-05-28 ENCOUNTER — Ambulatory Visit: Payer: 59 | Admitting: General Surgery

## 2014-05-29 ENCOUNTER — Other Ambulatory Visit: Payer: Self-pay | Admitting: *Deleted

## 2014-05-29 ENCOUNTER — Telehealth: Payer: Self-pay | Admitting: *Deleted

## 2014-05-29 MED ORDER — GLUCOSE BLOOD VI STRP: ORAL_STRIP | Status: DC

## 2014-05-29 NOTE — Telephone Encounter (Signed)
Pt left VM, needing refill for Verio test strips. Rx sent to pharmacy by escript

## 2014-05-29 NOTE — Telephone Encounter (Signed)
Fax from [facility], needing PA for Verio test strips. Spoke with pt, advised to call his insurance and see what brand glucometer his insurance prefers and to call us back so that we may send in a new Rx.  verbalized understanding

## 2014-06-03 ENCOUNTER — Other Ambulatory Visit: Payer: Self-pay | Admitting: General Surgery

## 2014-06-03 ENCOUNTER — Encounter: Payer: Self-pay | Admitting: General Surgery

## 2014-06-03 ENCOUNTER — Ambulatory Visit (INDEPENDENT_AMBULATORY_CARE_PROVIDER_SITE_OTHER): Admitting: General Surgery

## 2014-06-03 VITALS — BP 140/90 | HR 80 | Resp 14 | Ht 68.0 in | Wt 193.0 lb

## 2014-06-03 DIAGNOSIS — Z1211 Encounter for screening for malignant neoplasm of colon: Secondary | ICD-10-CM

## 2014-06-03 MED ORDER — POLYETHYLENE GLYCOL 3350 17 GM/SCOOP PO POWD
ORAL | Status: DC
Start: 2014-06-03 — End: 2014-07-23

## 2014-06-03 NOTE — Patient Instructions (Addendum)
Colonoscopy A colonoscopy is an exam to look at the entire large intestine (colon). This exam can help find problems such as tumors, polyps, inflammation, and areas of bleeding. The exam takes about 1 hour.  LET [facility] CARE PROVIDER KNOW ABOUT:   Any allergies you have.  All medicines you are taking, including vitamins, herbs, eye drops, creams, and over-the-counter medicines.  Previous problems you or members of your family have had with the use of anesthetics.  Any blood disorders you have.  Previous surgeries you have had.  Medical conditions you have. RISKS AND COMPLICATIONS  Generally, this is a safe procedure. However, as with any procedure, complications can occur. Possible complications include:  Bleeding.  Tearing or rupture of the colon wall.  Reaction to medicines given during the exam.  Infection (rare). BEFORE THE PROCEDURE   Ask your health care provider about changing or stopping your regular medicines.  You may be prescribed an oral bowel prep. This involves drinking a large amount of medicated liquid, starting the day before your procedure. The liquid will cause you to have multiple loose stools until your stool is almost clear or light green. This cleans out your colon in preparation for the procedure.  Do not eat or drink anything else once you have started the bowel prep, unless your health care provider tells you it is safe to do so.  Arrange for someone to drive you home after the procedure. PROCEDURE   You will be given medicine to help you relax (sedative).  You will lie on your side with your knees bent.  A long, flexible tube with a light and camera on the end (colonoscope) will be inserted through the rectum and into the colon. The camera sends video back to a computer screen as it moves through the colon. The colonoscope also releases carbon dioxide gas to inflate the colon. This helps your health care provider see the area better.  During  the exam, your health care provider may take a small tissue sample (biopsy) to be examined under a microscope if any abnormalities are found.  The exam is finished when the entire colon has been viewed. AFTER THE PROCEDURE   Do not drive for 24 hours after the exam.  You may have a small amount of blood in your stool.  You may pass moderate amounts of gas and have mild abdominal cramping or bloating. This is caused by the gas used to inflate your colon during the exam.  Ask when your test results will be ready and how you will get your results. Make sure you get your test results. Document Released: 06/11/2000 Document Revised: 04/04/2013 Document Reviewed: 02/19/2013 [facility] Patient Information 2015 [place], [place]. This information is not intended to replace advice given to you by your health care provider. Make sure you discuss any questions you have with your health care provider.  Patient has been scheduled for a colonoscopy on 06-26-14 at [facility]. This patient has been asked to discontinue fish oil one week prior to procedure.

## 2014-06-03 NOTE — Progress Notes (Signed)
Patient ID: Robert Robertson, male   DOB: March 08, 1951, 63 y.o.   MRN: [id number]  Chief Complaint  Patient presents with  . Other    Tubular adenoma-Colonoscopy discussion    HPI Robert Robertson is a 63 y.o. male who presents for an evaluation of a tubular adenoma of the colon. The patient's last colonoscopy was performed in 2010. One polyp was removed at this time. The patient denies any problems with his GI system.   HPI  Past Medical History  Diagnosis Date  . Hypertension   . Diabetes mellitus   . Allergy   . Hyperlipidemia   . Colon polyp     Past Surgical History  Procedure Laterality Date  . Colonoscopy  2010    10 mm adenomatous polyp in the descending colon without atypia, [name], MD    Family History  Problem Relation Age of Onset  . Diabetes Mother   . Stroke Mother   . Diabetes Brother   . Diabetes Maternal Grandfather   . Diabetes Maternal Grandmother   . Cancer Sister     brain tumor    Social History History  Substance Use Topics  . Smoking status: Current Every Day Smoker -- 1.00 packs/day for 30 years    Types: Cigarettes  . Smokeless tobacco: Never Used  . Alcohol Use: 0.0 oz/week    0 Not specified per week    Allergies  Allergen Reactions  . Pollen Extract Other (See Comments)    Hay-fever. Reaction: Sneezing    Current Outpatient Prescriptions  Medication Sig Dispense Refill  . aspirin 81 MG tablet Take 81 mg by mouth daily.    . beta carotene w/minerals (OCUVITE) tablet Take 1 tablet by mouth daily.    . Garlic 3299 MG TABS Take 1 tablet by mouth daily.    . Ginkgo Biloba 40 MG TABS Take 120 mg by mouth 2 (two) times daily.    Javier Docker Oil 300 MG CAPS Take 1 capsule by mouth daily.    [name] lisinopril (PRINIVIL,ZESTRIL) 10 MG tablet Take 1 tablet (10 mg total) by mouth daily. 90 tablet 3  . Plant Sterols and Stanols (CHOLEST OFF) 450 MG TABS Take 2 tablets by mouth every morning.     . Selenium 200 MCG TABS Take 200 mcg by mouth daily.    .  polyethylene glycol powder (GLYCOLAX/MIRALAX) powder 255 grams one bottle for colonoscopy prep 255 g 0   No current facility-administered medications for this visit.    Review of Systems Review of Systems  Constitutional: Negative.   Respiratory: Negative.   Cardiovascular: Negative.   Gastrointestinal: Negative.     Blood pressure 140/90, pulse 80, resp. rate 14, height 5\' 8"  (1.727 m), weight 193 lb (87.544 kg).  Physical Exam Physical Exam  Constitutional: He is oriented to person, place, and time. He appears well-developed and well-nourished.  Cardiovascular: Normal rate, regular rhythm and normal heart sounds.   No murmur heard. Pulmonary/Chest: Effort normal and breath sounds normal.  Abdominal: Soft. Bowel sounds are normal. There is no tenderness.  Neurological: He is alert and oriented to person, place, and time.  Skin: Skin is warm and dry.    Data Reviewed 2010 endoscopy report and pathology  Assessment    Candidate for follow-up colonoscopy.    Plan    Patient has been scheduled for a colonoscopy on 06-26-14 at [facility]. This patient has been asked to discontinue fish oil one week prior to procedure.  The risk and benefits  of the procedure been reviewed.     PCP:  Wende Mott 06/03/2014, 2:41 PM

## 2014-06-04 NOTE — Telephone Encounter (Signed)
Called in script for  Test strips and gave patient a meter.

## 2014-06-04 NOTE — Telephone Encounter (Signed)
The patient called and stated his insurance supports the Freestyle freedom light.  He is hoping for an rx for test strips for this meter.   Callback - [phone]

## 2014-06-05 ENCOUNTER — Other Ambulatory Visit: Payer: Self-pay | Admitting: *Deleted

## 2014-06-05 DIAGNOSIS — I10 Essential (primary) hypertension: Secondary | ICD-10-CM

## 2014-06-05 MED ORDER — LISINOPRIL 10 MG PO TABS: 10.0000 mg | ORAL_TABLET | Freq: Every day | ORAL | Status: DC

## 2014-06-14 LAB — HM DIABETES EYE EXAM

## 2014-06-17 ENCOUNTER — Other Ambulatory Visit: Payer: Self-pay | Admitting: *Deleted

## 2014-06-17 MED ORDER — GLUCOSE BLOOD VI STRP: ORAL_STRIP | Status: DC

## 2014-06-17 MED ORDER — FREESTYLE LANCETS MISC: Status: DC

## 2014-06-20 ENCOUNTER — Encounter: Payer: Self-pay | Admitting: Internal Medicine

## 2014-06-26 ENCOUNTER — Ambulatory Visit: Payer: Self-pay | Admitting: General Surgery

## 2014-06-26 DIAGNOSIS — D126 Benign neoplasm of colon, unspecified: Secondary | ICD-10-CM

## 2014-07-02 ENCOUNTER — Encounter: Payer: Self-pay | Admitting: General Surgery

## 2014-07-03 ENCOUNTER — Encounter: Payer: Self-pay | Admitting: Internal Medicine

## 2014-07-23 ENCOUNTER — Encounter: Payer: Self-pay | Admitting: Endocrinology

## 2014-07-23 ENCOUNTER — Ambulatory Visit (INDEPENDENT_AMBULATORY_CARE_PROVIDER_SITE_OTHER): Admitting: Endocrinology

## 2014-07-23 VITALS — BP 130/82 | HR 93 | Ht 68.25 in | Wt 194.2 lb

## 2014-07-23 DIAGNOSIS — E785 Hyperlipidemia, unspecified: Secondary | ICD-10-CM

## 2014-07-23 DIAGNOSIS — I10 Essential (primary) hypertension: Secondary | ICD-10-CM

## 2014-07-23 DIAGNOSIS — E119 Type 2 diabetes mellitus without complications: Secondary | ICD-10-CM

## 2014-07-23 NOTE — Assessment & Plan Note (Signed)
BP  well controlled today.  Recent urine MA normal Nov 2015.

## 2014-07-23 NOTE — Progress Notes (Signed)
Pre visit review using our clinic review tool, if applicable. No additional management support is needed unless otherwise documented below in the visit note. 

## 2014-07-23 NOTE — Assessment & Plan Note (Signed)
He has done quite well since last visit, with modifying his diet. His sugars are mostly at goal. Encouraged him to check sugars 2 x daily.   We discussed about ideal sugars and goal A1c. He will have his A1c tested at follow up visit with Dr Derrel Nip in Feb.  We discussed that if his A1c is close to goal of 7% or under, then wouldn't recommend starting any therapy at this time.  If A1c >8% then would recommend starting metformin. If between 7-8% then will discuss starting metformin. His goal is to control his DM, without any medications.   Okay to hold off DM nutrition classes at this stage.   Discussed exercise. He is going to start an exercise program.   He will continue to monitor his sugars 2x daily.  RTC 3-4 months

## 2014-07-23 NOTE — Progress Notes (Signed)
Reason for visit-  Robert Robertson. is a 64 y.o.-year-old male, here for follow up management of Type 2 diabetes, uncontrolled, without complications.    HPI- Patient has been diagnosed with pre-diabetes in ~ 2010. 2015, had an ER visit for nose bleed and  hyperglycemia was detected. He was prescribed metformin, which he never took.  Has been on lifestyle modifications so far.   he has not been on insulin before.    Pt is currently on a regimen of: -Selenium 200 mcg tablets>> now taking Centrum silver daily  Has follow up appt with Dr Derrel Nip 07/29/13  Last hemoglobin A1c was: Lab Results  Component Value Date   HGBA1C 10.1* 05/14/2014     Pt checks his sugars 2 a day . Uses freestyle style glucometer. By meter download they are:   PREMEAL Breakfast Lunch Dinner Bedtime Overall  Glucose range: 114-145 98-119 102-124 123-127 117  Mean/median:        POST-MEAL PC Breakfast PC Lunch PC Dinner  Glucose range:     Mean/median:       Hypoglycemia-  No lows or symptoms thereof recently.   Dietary habits- eats three times daily. Now really watching his diet and controlling portion sizes. Didn't keep follow up with nutrition, and we feel that he doesn't need one at this stage.  Exercise- not doing anything. Is considering starting gym at [facility]. Weight - down since last year, denies symptoms of hyperglycemia-stable since last visit Wt Readings from Last 3 Encounters:  07/23/14 194 lb 4 oz (88.111 kg)  06/03/14 193 lb (87.544 kg)  05/21/14 194 lb 12 oz (88.338 kg)    Diabetes Complications-  Nephropathy- No  CKD, last BUN/creatinine-  Lab Results  Component Value Date   BUN 14 05/14/2014   CREATININE 0.9 05/14/2014   Lab Results  Component Value Date   GFR 109.43 05/14/2014   MICRALBCREAT 1.2 05/14/2014    Retinopathy- No, prior going every 2 years, DEE done Dec 2015- no DR per pt report, but glaucoma suspect Neuropathy- no numbness and tingling in his feet. No known  neuropathy. Has 2 degenerative disks, causing pain occasionally. Associated history - No CAD . No prior stroke. No hypothyroidism. his last TSH was No results found for: TSH  Hyperlipidemia-  his last set of lipids were- Currently on dietary therapy and garlic and OTC supplements. Has been on Crestor and Lipitor in the past for his lipids on and off over the years.  Lab Results  Component Value Date   CHOL 248* 05/14/2014   HDL 43.90 05/14/2014   LDLCALC 182* 05/14/2014   TRIG 109.0 05/14/2014   CHOLHDL 6 05/14/2014    Blood Pressure/HTN- Patient's blood pressure is well controlled today on current regimen that includes ACE-I ( Lisinopril).     I have reviewed the patient's past medical history, medications and allergies.   Current Outpatient Prescriptions on File Prior to Visit  Medication Sig Dispense Refill  . aspirin 81 MG tablet Take 81 mg by mouth daily.    . Garlic 7893 MG TABS Take 1 tablet by mouth daily.    [name] glucose blood (FREESTYLE LITE) test strip Check sugars bid 200 each 1  . Krill Oil 300 MG CAPS Take 1 capsule by mouth daily.    . Lancets (FREESTYLE) lancets Check sugar bid 200 each 1  . lisinopril (PRINIVIL,ZESTRIL) 10 MG tablet Take 1 tablet (10 mg total) by mouth daily. 90 tablet 3  . Plant Sterols and Stanols (  CHOLEST OFF) 450 MG TABS Take 2 tablets by mouth every morning.     . Selenium 200 MCG TABS Take 200 mcg by mouth daily.     No current facility-administered medications on file prior to visit.   Allergies  Allergen Reactions  . Pollen Extract Other (See Comments)    Hay-fever. Reaction: Sneezing    Review of Systems- [ x ]  Complains of    [  ]  denies [  ] Recent weight change [  ]  Fatigue [  ] polydipsia [  ] polyuria [  ]  nocturia [  ]  vision difficulty [  ] chest pain [  ] shortness of breath [  ] leg swelling [  ] cough [  ] nausea/vomiting [  ] diarrhea [  ] constipation [  ] abdominal pain [  ]  tingling/numbness in extremities [  ]   concern with feet ( wounds/sores)   PE: BP 130/82 mmHg  Pulse 93  Ht 5' 8.25" (1.734 m)  Wt 194 lb 4 oz (88.111 kg)  BMI 29.30 kg/m2  SpO2 98% Wt Readings from Last 3 Encounters:  07/23/14 194 lb 4 oz (88.111 kg)  06/03/14 193 lb (87.544 kg)  05/21/14 194 lb 12 oz (88.338 kg)   Exam: deferred  ASSESSMENT AND PLAN: Problem List Items Addressed This Visit      Cardiovascular and Mediastinum   Essential hypertension    BP  well controlled today.  Recent urine MA normal Nov 2015.           Endocrine   Diabetes mellitus type 2 in nonobese - Primary    He has done quite well since last visit, with modifying his diet. His sugars are mostly at goal. Encouraged him to check sugars 2 x daily.   We discussed about ideal sugars and goal A1c. He will have his A1c tested at follow up visit with Dr Derrel Nip in Feb.  We discussed that if his A1c is close to goal of 7% or under, then wouldn't recommend starting any therapy at this time.  If A1c >8% then would recommend starting metformin. If between 7-8% then will discuss starting metformin. His goal is to control his DM, without any medications.   Okay to hold off DM nutrition classes at this stage.   Discussed exercise. He is going to start an exercise program.   He will continue to monitor his sugars 2x daily.  RTC 3-4 months              Other   Hyperlipidemia    Lipids not controlled. Has been on statin therapy in the past and tolerated them well. Ran out of RF due to irregular medical follow up. Now wants to improve lipids with diet and exercise for now and reassess in a few months the need for statin therapy. Continue fish oil for now.  Of note, has mild elevations in liver tests as well. Lab Results  Component Value Date   AST 67* 05/14/2014   ALT 74* 05/14/2014   Has follow up appt with Dr Derrel Nip coming up.                 - Return to clinic in 3-4 mo with sugar log/meter.  Robert Robertson  [facility] 07/23/2014 8:52 AM

## 2014-07-23 NOTE — Patient Instructions (Addendum)
Check sugars 2 x daily ( before breakfast and before supper).  Record them in a log book and bring that/meter to next appointment.   Labs with Dr Derrel Nip at follow up appt in Feb   Further treatment plan based on sugars.   Start exercise program in an effort to lose weight.   Please come back for a follow-up appointment in 3-4 months

## 2014-07-23 NOTE — Assessment & Plan Note (Signed)
Lipids not controlled. Has been on statin therapy in the past and tolerated them well. Ran out of RF due to irregular medical follow up. Now wants to improve lipids with diet and exercise for now and reassess in a few months the need for statin therapy. Continue fish oil for now.  Of note, has mild elevations in liver tests as well. Lab Results  Component Value Date   AST 67* 05/14/2014   ALT 74* 05/14/2014   Has follow up appt with Dr Derrel Nip coming up.

## 2014-08-07 ENCOUNTER — Encounter: Payer: Self-pay | Admitting: Internal Medicine

## 2014-08-07 ENCOUNTER — Ambulatory Visit (INDEPENDENT_AMBULATORY_CARE_PROVIDER_SITE_OTHER): Admitting: Internal Medicine

## 2014-08-07 VITALS — BP 130/78 | HR 98 | Temp 98.7°F | Resp 16 | Ht 68.0 in | Wt 195.0 lb

## 2014-08-07 DIAGNOSIS — Z23 Encounter for immunization: Secondary | ICD-10-CM

## 2014-08-07 DIAGNOSIS — E785 Hyperlipidemia, unspecified: Secondary | ICD-10-CM

## 2014-08-07 DIAGNOSIS — Z716 Tobacco abuse counseling: Secondary | ICD-10-CM

## 2014-08-07 DIAGNOSIS — E663 Overweight: Secondary | ICD-10-CM

## 2014-08-07 DIAGNOSIS — K5730 Diverticulosis of large intestine without perforation or abscess without bleeding: Secondary | ICD-10-CM

## 2014-08-07 DIAGNOSIS — I10 Essential (primary) hypertension: Secondary | ICD-10-CM

## 2014-08-07 DIAGNOSIS — E119 Type 2 diabetes mellitus without complications: Secondary | ICD-10-CM

## 2014-08-07 NOTE — Patient Instructions (Signed)
Return  on or after Feb 18th for fasting labs.   You received the TdaP vaccine today

## 2014-08-07 NOTE — Assessment & Plan Note (Addendum)
Historically balanced by high HDL as a young man , but now requiring  statin therapy.   He will return for fasting labs now that his DM is better controlled.   Lab Results  Component Value Date   CHOL 248* 05/14/2014   HDL 43.90 05/14/2014   LDLCALC 182* 05/14/2014   TRIG 109.0 05/14/2014   CHOLHDL 6 05/14/2014

## 2014-08-07 NOTE — Progress Notes (Signed)
Pre-visit discussion using our clinic review tool. No additional management support is needed unless otherwise documented below in the visit note.  

## 2014-08-07 NOTE — Progress Notes (Signed)
Patient ID: Robert Lat., male   DOB: 1950/11/19, 64 y.o.   MRN: [id number]  Patient Active Problem List   Diagnosis Date Noted  . Diverticulosis of colon without hemorrhage 08/07/2014  . Encounter for screening colonoscopy 06/03/2014  . Hyperlipidemia 05/21/2014  . Essential hypertension 05/11/2014  . Diabetes mellitus type 2 in nonobese 05/11/2014  . Tubular adenoma of colon 05/11/2014  . Tobacco abuse 05/11/2014  . Tobacco abuse counseling 05/11/2014    Subjective:  CC:   Chief Complaint  Patient presents with  . Follow-up    Smoking cessation and cholesterol medication  . Hypertension  . Diabetes    HPI:   Robert Robertson. is a 64 y.o. male who presents for  Follow up on hypertension, hyperlipidemia,  and tobacco abuse .  His DM has been managed by [facility] .  He feels generally well, is exercising several times per week and checking blood sugars once daily at variable times.  BS have been under 130 fasting and < 150 post prandially.  Denies any recent hypoglyemic events.  Taking his medications as directed. Following a carbohydrate modified diet 6 days per week. Denies numbness, burning and tingling of extremities. Appetite is good.    He is motivated to quit smoking and would like to discuss the available therapies.    Past Medical History  Diagnosis Date  . Hypertension   . Diabetes mellitus   . Allergy   . Hyperlipidemia   . Colon polyp     Past Surgical History  Procedure Laterality Date  . Colonoscopy  2010    10 mm adenomatous polyp in the descending colon without atypia, Robert Lame, MD       The following portions of the patient's history were reviewed and updated as appropriate: Allergies, current medications, and problem list.    Review of Systems:   Patient denies headache, fevers, malaise, unintentional weight loss, skin rash, eye pain, sinus congestion and sinus pain, sore throat, dysphagia,  hemoptysis , cough, dyspnea,  wheezing, chest pain, palpitations, orthopnea, edema, abdominal pain, nausea, melena, diarrhea, constipation, flank pain, dysuria, hematuria, urinary  Frequency, nocturia, numbness, tingling, seizures,  Focal weakness, Loss of consciousness,  Tremor, insomnia, depression, anxiety, and suicidal ideation.     History   Social History  . Marital Status: Married    Spouse Name: N/A  . Number of Children: N/A  . Years of Education: N/A   Occupational History  . Not on file.   Social History Main Topics  . Smoking status: Current Every Day Smoker -- 1.00 packs/day for 30 years    Types: Cigarettes  . Smokeless tobacco: Never Used  . Alcohol Use: 0.0 oz/week    0 Standard drinks or equivalent per week  . Drug Use: No  . Sexual Activity: Not on file   Other Topics Concern  . Not on file   Social History Narrative    Objective:  Filed Vitals:   08/07/14 1506  BP: 130/78  Pulse: 98  Temp: 98.7 F (37.1 C)  Resp: 16     General appearance: alert, cooperative and appears stated age Ears: normal TM's and external ear canals both ears Throat: lips, mucosa, and tongue normal; teeth and gums normal Neck: no adenopathy, no carotid bruit, supple, symmetrical, trachea midline and thyroid not enlarged, symmetric, no tenderness/mass/nodules Back: symmetric, no curvature. ROM normal. No CVA tenderness. Lungs: clear to auscultation bilaterally Heart: regular rate and rhythm, S1, S2 normal, no murmur, click, rub  or gallop Abdomen: soft, non-tender; bowel sounds normal; no masses,  no organomegaly Pulses: 2+ and symmetric Skin: Skin color, texture, turgor normal. No rashes or lesions Lymph nodes: Cervical, supraclavicular, and axillary nodes normal.  Assessment and Plan:   Problem List Items Addressed This Visit    Tobacco abuse counseling    The patient was counseled on the dangers of tobacco use, and was advised to quit.  Reviewed strategies to maximize success, including removing  cigarettes and smoking materials from environment.      Patient overweight    I have addressed  BMI and recommended wt loss of 10% of body weigh over the next 6 months using a low glycemic index diet and regular exercise a minimum of 5 days per week.        Hyperlipidemia - Primary    Historically balanced by high HDL as a young man , but now requiring  statin therapy.   He will return for fasting labs now that his DM is better controlled.   Lab Results  Component Value Date   CHOL 248* 05/14/2014   HDL 43.90 05/14/2014   LDLCALC 182* 05/14/2014   TRIG 109.0 05/14/2014   CHOLHDL 6 05/14/2014        Relevant Orders   Lipid panel   Essential hypertension    Well controlled on current regimen. Renal function is due, no changes today.      Diverticulosis of colon without hemorrhage    Other Visit Diagnoses    Diabetes mellitus without complication        Relevant Orders    Comprehensive metabolic panel    Hemoglobin A1c    Need for prophylactic vaccination with combined diphtheria-tetanus-pertussis (DTP) vaccine        Relevant Orders    Tdap vaccine greater than or equal to 7yo IM (Completed)      A total of 25 minutes of face to face time was spent with patient more than half of which was spent in counselling about the above mentioned conditions  and coordination of care

## 2014-08-09 ENCOUNTER — Encounter: Payer: Self-pay | Admitting: Internal Medicine

## 2014-08-09 DIAGNOSIS — E669 Obesity, unspecified: Secondary | ICD-10-CM | POA: Insufficient documentation

## 2014-08-09 NOTE — Assessment & Plan Note (Signed)
Well controlled on current regimen. Renal function is due, no changes today. °

## 2014-08-09 NOTE — Assessment & Plan Note (Signed)
I have addressed  BMI and recommended wt loss of 10% of body weigh over the next 6 months using a low glycemic index diet and regular exercise a minimum of 5 days per week.   

## 2014-08-09 NOTE — Assessment & Plan Note (Signed)
The patient was counseled on the dangers of tobacco use, and was advised to quit.  Reviewed strategies to maximize success, including removing cigarettes and smoking materials from environment. 

## 2014-08-15 ENCOUNTER — Other Ambulatory Visit

## 2014-08-22 ENCOUNTER — Other Ambulatory Visit

## 2014-08-29 ENCOUNTER — Encounter: Payer: Self-pay | Admitting: *Deleted

## 2014-08-29 ENCOUNTER — Other Ambulatory Visit (INDEPENDENT_AMBULATORY_CARE_PROVIDER_SITE_OTHER)

## 2014-08-29 DIAGNOSIS — E119 Type 2 diabetes mellitus without complications: Secondary | ICD-10-CM

## 2014-08-29 DIAGNOSIS — E785 Hyperlipidemia, unspecified: Secondary | ICD-10-CM

## 2014-08-29 LAB — COMPREHENSIVE METABOLIC PANEL
ALT: 47 U/L (ref 0–53)
AST: 40 U/L — AB (ref 0–37)
Albumin: 4.3 g/dL (ref 3.5–5.2)
Alkaline Phosphatase: 71 U/L (ref 39–117)
BILIRUBIN TOTAL: 0.6 mg/dL (ref 0.2–1.2)
BUN: 17 mg/dL (ref 6–23)
CHLORIDE: 104 meq/L (ref 96–112)
CO2: 28 mEq/L (ref 19–32)
CREATININE: 0.99 mg/dL (ref 0.40–1.50)
Calcium: 9.3 mg/dL (ref 8.4–10.5)
GFR: 97.94 mL/min (ref >60.00)
Glucose, Bld: 136 mg/dL — ABNORMAL HIGH (ref 70–99)
Potassium: 4.2 mEq/L (ref 3.5–5.1)
Sodium: 136 mEq/L (ref 135–145)
Total Protein: 7.5 g/dL (ref 6.0–8.3)

## 2014-08-29 LAB — HEMOGLOBIN A1C: Hgb A1c MFr Bld: 7.5 % — ABNORMAL HIGH (ref 4.6–6.5)

## 2014-08-29 LAB — LIPID PANEL
CHOL/HDL RATIO: 5
Cholesterol: 236 mg/dL — ABNORMAL HIGH (ref 0–200)
HDL: 52.4 mg/dL (ref >39.00)
LDL Cholesterol: 167 mg/dL — ABNORMAL HIGH (ref 0–99)
NonHDL: 183.6
Triglycerides: 84 mg/dL (ref 0.0–149.0)
VLDL: 16.8 mg/dL (ref 0.0–40.0)

## 2014-11-05 ENCOUNTER — Ambulatory Visit: Admitting: Endocrinology

## 2014-11-12 ENCOUNTER — Encounter: Payer: Self-pay | Admitting: Endocrinology

## 2014-11-12 ENCOUNTER — Ambulatory Visit (INDEPENDENT_AMBULATORY_CARE_PROVIDER_SITE_OTHER): Admitting: Endocrinology

## 2014-11-12 ENCOUNTER — Other Ambulatory Visit: Payer: Self-pay | Admitting: Internal Medicine

## 2014-11-12 VITALS — BP 128/76 | HR 103 | Resp 14 | Ht 68.0 in | Wt 193.0 lb

## 2014-11-12 DIAGNOSIS — E785 Hyperlipidemia, unspecified: Secondary | ICD-10-CM | POA: Diagnosis not present

## 2014-11-12 DIAGNOSIS — E119 Type 2 diabetes mellitus without complications: Secondary | ICD-10-CM

## 2014-11-12 DIAGNOSIS — I10 Essential (primary) hypertension: Secondary | ICD-10-CM | POA: Diagnosis not present

## 2014-11-12 NOTE — Progress Notes (Signed)
Pre visit review using our clinic review tool, if applicable. No additional management support is needed unless otherwise documented below in the visit note. 

## 2014-11-12 NOTE — Assessment & Plan Note (Signed)
He has done quite well since last visit, with modifying his diet. His sugars are mostly at goal, except few times in the morning. Encouraged him to check sugars 1 x daily.   We discussed about ideal sugars and goal A1c. He will have his A1c tested at follow up visit with Dr Derrel Nip in August.  We discussed that if his A1c is close to goal of 7% or under, then wouldn't recommend starting any therapy at this time.  If A1c >8% then would recommend starting metformin. If between 7-8% then will discuss starting metformin. His goal is to control his DM, without any medications.   Discussed exercise. He is going to start an exercise program.   He will continue to monitor his sugars 1x daily at varying times of the day.

## 2014-11-12 NOTE — Progress Notes (Signed)
Reason for visit-  Robert Robertson. is a 64 y.o.-year-old male, here for follow up management of Type 2 diabetes, uncontrolled, without complications.  Last visit Jan 2016.  HPI- Patient has been diagnosed with pre-diabetes in ~ 2010. 2015, had an ER visit for nose bleed and  hyperglycemia was detected. He was prescribed metformin, which he never took.  Has been on lifestyle modifications so far.   he has not been on insulin before.    Pt is currently on a regimen of: -Selenium 200 mcg tablets>> now taking Centrum silver daily   Last hemoglobin A1c was: Lab Results  Component Value Date   HGBA1C 7.5* 08/29/2014   HGBA1C 10.1* 05/14/2014     Pt checks his sugars 2 a day lately . Uses freestyle style glucometer. By meter download they are:   PREMEAL Breakfast Lunch Dinner Bedtime Overall  Glucose range: 100-186  111-157  144  Mean/median:        POST-MEAL PC Breakfast PC Lunch PC Dinner  Glucose range:     Mean/median:       Hypoglycemia-  No lows or symptoms thereof recently.   Dietary habits- eats three times daily. Now really watching his diet and controlling portion sizes. Didn't keep follow up with nutrition, and we feel that he doesn't need one at this stage.>> Not as good as first but doing okay Exercise- not doing anything. Is considering starting gym at [employer].>>didn't do that last time Weight - down since last year, denies symptoms of hyperglycemia Wt Readings from Last 3 Encounters:  11/12/14 193 lb (87.544 kg)  08/07/14 195 lb (88.451 kg)  07/23/14 194 lb 4 oz (88.111 kg)    Diabetes Complications-  Nephropathy- No  CKD, last BUN/creatinine-  Lab Results  Component Value Date   BUN 17 08/29/2014   CREATININE 0.99 08/29/2014   Lab Results  Component Value Date   GFR 97.94 08/29/2014   MICRALBCREAT 1.2 05/14/2014    Retinopathy- No, prior going every 2 years, DEE done Dec 2015- no DR per pt report, but glaucoma suspect Neuropathy- no numbness  and tingling in his feet. No known neuropathy. Has 2 degenerative disks, causing pain occasionally. Associated history - No CAD . No prior stroke. No hypothyroidism. his last TSH was No results found for: TSH  Hyperlipidemia-  his last set of lipids were- Currently on dietary therapy and garlic and OTC supplements. Has been on Crestor and Lipitor in the past for his lipids on and off over the years. Wants to control it with diet. Lab Results  Component Value Date   CHOL 236* 08/29/2014   HDL 52.40 08/29/2014   LDLCALC 167* 08/29/2014   TRIG 84.0 08/29/2014   CHOLHDL 5 08/29/2014    Blood Pressure/HTN- Patient's blood pressure is well controlled today on current regimen that includes ACE-I ( Lisinopril).     I have reviewed the patient's past medical history, medications and allergies.   Current Outpatient Prescriptions on File Prior to Visit  Medication Sig Dispense Refill  . aspirin 81 MG tablet Take 81 mg by mouth daily.    [name] glucose blood (FREESTYLE LITE) test strip Check sugars bid 200 each 1  . Krill Oil 300 MG CAPS Take 1 capsule by mouth daily.    . Lancets (FREESTYLE) lancets Check sugar bid 200 each 1  . lisinopril (PRINIVIL,ZESTRIL) 10 MG tablet Take 1 tablet (10 mg total) by mouth daily. 90 tablet 3  . Multiple Vitamins-Minerals (CENTRUM SILVER ADULT 50+  PO) Take 1 tablet by mouth daily.     No current facility-administered medications on file prior to visit.   Allergies  Allergen Reactions  . Pollen Extract Other (See Comments)    Hay-fever. Reaction: Sneezing    Review of Systems- [ x ]  Complains of    [  ]  denies [  ] Recent weight change [  ]  Fatigue [  ] polydipsia [  ] polyuria [  ]  nocturia [  ]  vision difficulty [  ] chest pain [  ] shortness of breath [  ] leg swelling [  ] cough [  ] nausea/vomiting [  ] diarrhea [  ] constipation [  ] abdominal pain [  ]  tingling/numbness in extremities [  ]  concern with feet ( wounds/sores)   PE: BP 128/76 mmHg   Pulse 103  Resp 14  Ht 5\' 8"  (1.727 m)  Wt 193 lb (87.544 kg)  BMI 29.35 kg/m2  SpO2 97% Wt Readings from Last 3 Encounters:  11/12/14 193 lb (87.544 kg)  08/07/14 195 lb (88.451 kg)  07/23/14 194 lb 4 oz (88.111 kg)   Exam: deferred  ASSESSMENT AND PLAN: Problem List Items Addressed This Visit      Cardiovascular and Mediastinum   Essential hypertension    BP  well controlled today.  Recent urine MA normal Nov 2015.             Endocrine   Diabetes mellitus type 2 in nonobese - Primary    He has done quite well since last visit, with modifying his diet. His sugars are mostly at goal, except few times in the morning. Encouraged him to check sugars 1 x daily.   We discussed about ideal sugars and goal A1c. He will have his A1c tested at follow up visit with Dr Derrel Nip in August.  We discussed that if his A1c is close to goal of 7% or under, then wouldn't recommend starting any therapy at this time.  If A1c >8% then would recommend starting metformin. If between 7-8% then will discuss starting metformin. His goal is to control his DM, without any medications.   Discussed exercise. He is going to start an exercise program.   He will continue to monitor his sugars 1x daily at varying times of the day.                  Other   Hyperlipidemia    Lipids not controlled. Has been on statin therapy in the past and tolerated them well. Ran out of RF due to irregular medical follow up. Now wants to improve lipids with diet and exercise for now and reassess in a few months the need for statin therapy. Continue fish oil for now.  Of note, has mild elevations in liver tests as well. Lab Results  Component Value Date   AST 40* 08/29/2014   ALT 47 08/29/2014   Has follow up appt with Dr Derrel Nip coming up.                   - Return to clinic in 2 mo with sugar log/meter. Also, keep august appt with Dr Derrel Nip and fasting labs 1 week prior  [facility]  [facility] 11/12/2014 8:49 AM

## 2014-11-12 NOTE — Assessment & Plan Note (Signed)
Lipids not controlled. Has been on statin therapy in the past and tolerated them well. Ran out of RF due to irregular medical follow up. Now wants to improve lipids with diet and exercise for now and reassess in a few months the need for statin therapy. Continue fish oil for now.  Of note, has mild elevations in liver tests as well. Lab Results  Component Value Date   AST 40* 08/29/2014   ALT 47 08/29/2014   Has follow up appt with Dr Derrel Nip coming up.

## 2014-11-12 NOTE — Assessment & Plan Note (Signed)
BP  well controlled today.  Recent urine MA normal Nov 2015.

## 2014-11-12 NOTE — Patient Instructions (Signed)
Check sugars atleast once daily ( please rotate times of the day when you are checking).  Record them in a log book and bring that/meter to next appointment.   Start exercising and watch your diet.   Please come back for a follow-up appointment in 2 months.

## 2014-12-04 ENCOUNTER — Other Ambulatory Visit: Payer: Self-pay | Admitting: Internal Medicine

## 2015-01-09 ENCOUNTER — Other Ambulatory Visit

## 2015-01-13 ENCOUNTER — Ambulatory Visit: Admitting: Internal Medicine

## 2015-01-23 ENCOUNTER — Other Ambulatory Visit (INDEPENDENT_AMBULATORY_CARE_PROVIDER_SITE_OTHER)

## 2015-01-23 DIAGNOSIS — E119 Type 2 diabetes mellitus without complications: Secondary | ICD-10-CM

## 2015-01-23 LAB — COMPREHENSIVE METABOLIC PANEL
ALBUMIN: 4.2 g/dL (ref 3.5–5.2)
ALT: 42 U/L (ref 0–53)
AST: 38 U/L — ABNORMAL HIGH (ref 0–37)
Alkaline Phosphatase: 70 U/L (ref 39–117)
BUN: 11 mg/dL (ref 6–23)
CALCIUM: 9.3 mg/dL (ref 8.4–10.5)
CHLORIDE: 103 meq/L (ref 96–112)
CO2: 26 mEq/L (ref 19–32)
CREATININE: 0.96 mg/dL (ref 0.40–1.50)
GFR: 101.36 mL/min (ref >60.00)
GLUCOSE: 135 mg/dL — AB (ref 70–99)
POTASSIUM: 4.2 meq/L (ref 3.5–5.1)
SODIUM: 136 meq/L (ref 135–145)
Total Bilirubin: 0.9 mg/dL (ref 0.2–1.2)
Total Protein: 7.3 g/dL (ref 6.0–8.3)

## 2015-01-23 LAB — MICROALBUMIN / CREATININE URINE RATIO
Creatinine,U: 236.2 mg/dL
MICROALB/CREAT RATIO: 0.6 mg/g (ref 0.0–30.0)
Microalb, Ur: 1.3 mg/dL (ref 0.0–1.9)

## 2015-01-23 LAB — LIPID PANEL
Cholesterol: 220 mg/dL — ABNORMAL HIGH (ref 0–200)
HDL: 49.4 mg/dL (ref >39.00)
LDL Cholesterol: 155 mg/dL — ABNORMAL HIGH (ref 0–99)
NONHDL: 170.78
TRIGLYCERIDES: 80 mg/dL (ref 0.0–149.0)
Total CHOL/HDL Ratio: 4
VLDL: 16 mg/dL (ref 0.0–40.0)

## 2015-01-23 LAB — HEMOGLOBIN A1C: HEMOGLOBIN A1C: 6.9 % — AB (ref 4.6–6.5)

## 2015-01-27 ENCOUNTER — Encounter: Payer: Self-pay | Admitting: Internal Medicine

## 2015-01-27 ENCOUNTER — Ambulatory Visit (INDEPENDENT_AMBULATORY_CARE_PROVIDER_SITE_OTHER): Admitting: Internal Medicine

## 2015-01-27 VITALS — BP 130/82 | HR 84 | Temp 98.2°F | Resp 14 | Ht 68.0 in | Wt 197.5 lb

## 2015-01-27 DIAGNOSIS — R740 Nonspecific elevation of levels of transaminase and lactic acid dehydrogenase [LDH]: Secondary | ICD-10-CM

## 2015-01-27 DIAGNOSIS — E1169 Type 2 diabetes mellitus with other specified complication: Secondary | ICD-10-CM

## 2015-01-27 DIAGNOSIS — R748 Abnormal levels of other serum enzymes: Secondary | ICD-10-CM

## 2015-01-27 DIAGNOSIS — Z716 Tobacco abuse counseling: Secondary | ICD-10-CM | POA: Diagnosis not present

## 2015-01-27 DIAGNOSIS — E663 Overweight: Secondary | ICD-10-CM

## 2015-01-27 DIAGNOSIS — I10 Essential (primary) hypertension: Secondary | ICD-10-CM

## 2015-01-27 DIAGNOSIS — E785 Hyperlipidemia, unspecified: Secondary | ICD-10-CM | POA: Diagnosis not present

## 2015-01-27 DIAGNOSIS — R7401 Elevation of levels of liver transaminase levels: Secondary | ICD-10-CM

## 2015-01-27 DIAGNOSIS — E669 Obesity, unspecified: Secondary | ICD-10-CM

## 2015-01-27 DIAGNOSIS — E119 Type 2 diabetes mellitus without complications: Secondary | ICD-10-CM

## 2015-01-27 MED ORDER — ROSUVASTATIN CALCIUM 20 MG PO TABS: 20.0000 mg | ORAL_TABLET | Freq: Every day | ORAL | Status: DC

## 2015-01-27 NOTE — Progress Notes (Signed)
Pre-visit discussion using our clinic review tool. No additional management support is needed unless otherwise documented below in the visit note.  

## 2015-01-27 NOTE — Progress Notes (Signed)
Subjective:  Patient ID: Robert Robertson., male    DOB: Feb 03, 1951  Age: 64 y.o. MRN: [id number]  CC: The primary encounter diagnosis was Elevated liver enzymes. Diagnoses of Tobacco abuse counseling, Hyperlipidemia, Essential hypertension, Patient overweight, Elevated AST (SGOT), and Diabetes mellitus type 2 in obese were also pertinent to this visit.  HPI Robert Robertson. presents for folow up on type 2 DM.,  Hyperlipidemia  And tobacco abuse.  He feels generally well, is not exercising or checking blood sugars.  Taking  his medications as directed. Following a carbohydrate modified diet 6 days per week. Denies burning and tingling of extremities. Appetite is good.     Last eye exam Eec 2015 [place]fil[place]  Glaucoma suspect Not exercising.  Drinking > 2 glasses wine per night . Avoiding starches and refined sugar,  Two hypoglycemic episodes ,  Diet controlled DM   Reduced tobacco abuse to 2 cigs per day,   No claudication symptoms with walking but not exercising.  Robert Robertson on legs noted.  Having some chronic neuropathy with numbness whic has not progressed.  Has not seen podiatry for dystrophic toenail vs onychomycosis. Told he had onycho ,  Has had nail changes for 18 years   framingham  Used to calculate CAD risk 63%  Statins advised .    Outpatient Prescriptions Prior to Visit  Medication Sig Dispense Refill  . aspirin 81 MG tablet Take 81 mg by mouth daily.    [name] FREESTYLE LITE test strip CHECK SUGARS TWICE A DAY 200 each 1  . Krill Oil 300 MG CAPS Take 1 capsule by mouth daily.    . Lancets (FREESTYLE) lancets Check sugar bid 200 each 1  . lisinopril (PRINIVIL,ZESTRIL) 10 MG tablet Take 1 tablet (10 mg total) by mouth daily. 90 tablet 3  . Multiple Vitamins-Minerals (CENTRUM SILVER ADULT 50+ PO) Take 1 tablet by mouth daily.     No facility-administered medications prior to visit.    Review of Systems;  Patient denies headache, fevers, malaise, unintentional weight  Robertson, skin rash, eye pain, sinus congestion and sinus pain, sore throat, dysphagia,  hemoptysis , cough, dyspnea, wheezing, chest pain, palpitations, orthopnea, edema, abdominal pain, nausea, melena, diarrhea, constipation, flank pain, dysuria, hematuria, urinary  Frequency, nocturia, numbness, tingling, seizures,  Focal weakness, Robertson of consciousness,  Tremor, insomnia, depression, anxiety, and suicidal ideation.      Objective:  BP 130/82 mmHg  Pulse 84  Temp(Src) 98.2 F (36.8 C) (Oral)  Resp 14  Ht 5\' 8"  (1.727 m)  Wt 197 lb 8 oz (89.585 kg)  BMI 30.04 kg/m2  SpO2 97%  BP Readings from Last 3 Encounters:  01/27/15 130/82  11/12/14 128/76  08/07/14 130/78    Wt Readings from Last 3 Encounters:  01/27/15 197 lb 8 oz (89.585 kg)  11/12/14 193 lb (87.544 kg)  08/07/14 195 lb (88.451 kg)    General appearance: alert, cooperative and appears stated age Ears: normal TM's and external ear canals both ears Throat: lips, mucosa, and tongue normal; teeth and gums normal Neck: no adenopathy, no carotid bruit, supple, symmetrical, trachea midline and thyroid not enlarged, symmetric, no tenderness/mass/nodules Back: symmetric, no curvature. ROM normal. No CVA tenderness. Lungs: clear to auscultation bilaterally Heart: regular rate and rhythm, S1, S2 normal, no murmur, click, rub or gallop Abdomen: soft, non-tender; bowel sounds normal; no masses,  no organomegaly Pulses: 2+ and symmetric. Cap refill sluggish, comparaively  on right foot but < 3 sec  only.  Skin: Skin color, texture, turgor normal. No rashes or lesions.  Legs are hairlesst Lymph nodes: Cervical, supraclavicular, and axillary nodes normal.  Lab Results  Component Value Date   HGBA1C 6.9* 01/23/2015   HGBA1C 7.5* 08/29/2014   HGBA1C 10.1* 05/14/2014    Lab Results  Component Value Date   CREATININE 0.96 01/23/2015   CREATININE 0.99 08/29/2014   CREATININE 0.9 05/14/2014    Lab Results  Component Value Date     WBC 6.1 01/11/2013   HGB 15.0 01/11/2013   HCT 48.0 01/11/2013   PLT 154 02/28/2012   GLUCOSE 135* 01/23/2015   CHOL 220* 01/23/2015   TRIG 80.0 01/23/2015   HDL 49.40 01/23/2015   LDLCALC 155* 01/23/2015   ALT 42 01/23/2015   AST 38* 01/23/2015   NA 136 01/23/2015   K 4.2 01/23/2015   CL 103 01/23/2015   CREATININE 0.96 01/23/2015   BUN 11 01/23/2015   CO2 26 01/23/2015   PSA 2.00 05/14/2014   INR 0.99 02/29/2012   HGBA1C 6.9* 01/23/2015   MICROALBUR 1.3 01/23/2015    No results found.  Assessment & Plan:   Problem List Items Addressed This Visit      Unprioritized   Essential hypertension    Well controlled on current regimen. Renal function stable, no changes today.      Relevant Medications   rosuvastatin (CRESTOR) 20 MG tablet   Diabetes mellitus type 2 in obese    Currently well-controlled on diet alone .  hemoglobin A1c is  At  goal of  of less than 7.0 . Patient is reminded to schedule an annual eye exam and foot exam is uncanged today. Patient has no microalbuminuria. Patient is willing to resume statiin therapy for CAD risk reduction and on ACE/ARB for renal protection and hypertension  .  Lab Results  Component Value Date   HGBA1C 6.9* 01/23/2015   Lab Results  Component Value Date   MICROALBUR 1.3 01/23/2015   Lab Results  Component Value Date   CHOL 220* 01/23/2015   HDL 49.40 01/23/2015   LDLCALC 155* 01/23/2015   TRIG 80.0 01/23/2015   CHOLHDL 4 01/23/2015         Relevant Medications   rosuvastatin (CRESTOR) 20 MG tablet   Tobacco abuse counseling    Smoking cessation instruction/counseling given:  counseled patient on the dangers of tobacco use, advised patient to stop smoking, and reviewed strategies to maximize success      Hyperlipidemia    Based on current lipid profile, the risk of clinically significant CAD is 63% over the next 10 years, using the Framingham risk calculator. The [employer] recommends  starting patients aged 50 or higher on moderate intensity statin therapy for LDL between 70-189 and 10 yr risk of CAD > 7.5% ;  and high intensity therapy for anyone with LDL > 190.   He is willing to start therapy.       Relevant Medications   rosuvastatin (CRESTOR) 20 MG tablet   Patient overweight    Wt Readings from Last 3 Encounters:  01/27/15 197 lb 8 oz (89.585 kg)  11/12/14 193 lb (87.544 kg)  08/07/14 195 lb (88.451 kg)   I have addressed  BMI and recommended wt Robertson of 10% of body weight over the next 6 months using a low glycemic index diet and regular exercise a minimum of 5 days per week.        Elevated AST (SGOT)    Advised  to reduce alcohol to 2 glasses of wine per night.   Will initiate workup for other causes indluign fatty liver if no resolution       Other Visit Diagnoses    Elevated liver enzymes    -  Primary    Relevant Orders    Hepatic function panel      A total of 40 minutes was spent with patient more than half of which was spent in counseling patient on the above mentioned issues , reviewing and explaining recent labs and imaging studies done, and coordination of care.  I am having Mr. Drumwright start on rosuvastatin. I am also having him maintain his aspirin, Krill Oil, lisinopril, freestyle, Multiple Vitamins-Minerals (CENTRUM SILVER ADULT 50+ PO), FREESTYLE LITE, and Cinnamon.  Meds ordered this encounter  Medications  . Cinnamon 500 MG capsule    Sig: Take 500 mg by mouth daily.  . rosuvastatin (CRESTOR) 20 MG tablet    Sig: Take 1 tablet (20 mg total) by mouth daily.    Dispense:  90 tablet    Refill:  1    There are no discontinued medications.  Follow-up: Return in about 3 months (around 04/29/2015) for follow up diabetes.   [name], MD

## 2015-01-27 NOTE — Patient Instructions (Signed)
Your diabetes is under  under excellent control currently on Diet alone.  To continue this control without medications,  I recommend  adding 30 minutes of walking daily .   Based on your fasting cholesterol and your concurrent history of diabetes,  Tobacco abuse  And hypertension, your 10 year risk of having some type of coronary event (including heart attack) is 63% , meaning that 2o out of every 3 men  with the same medical statistics will have a heart attack in the next 10 years.     The [employer] recommends starting patients on moderate intensity statin therapy for people with this risk  to lower your risks of these events.  I would like you to consider starting  crestor  to lower your risk of heart attacks and strokes. We will need to repeat your liver tests in 6 weeks   Referral to podiatry is also in process   Limit alcohol to 2 drinks per night  Quit smoking as soon as possible   .Please return in 3 months for follow up on diabetes and make sure you are seeing your eye doctor  ever December

## 2015-01-28 DIAGNOSIS — K760 Fatty (change of) liver, not elsewhere classified: Secondary | ICD-10-CM | POA: Insufficient documentation

## 2015-01-28 NOTE — Assessment & Plan Note (Signed)
Wt Readings from Last 3 Encounters:  01/27/15 197 lb 8 oz (89.585 kg)  11/12/14 193 lb (87.544 kg)  08/07/14 195 lb (88.451 kg)   I have addressed  BMI and recommended wt loss of 10% of body weight over the next 6 months using a low glycemic index diet and regular exercise a minimum of 5 days per week.

## 2015-01-28 NOTE — Assessment & Plan Note (Addendum)
Currently well-controlled on diet alone .  hemoglobin A1c is  At  goal of  of less than 7.0 . Patient is reminded to schedule an annual eye exam and foot exam is uncanged today. Patient has no microalbuminuria. Patient is willing to resume statiin therapy for CAD risk reduction and on ACE/ARB for renal protection and hypertension  .  Lab Results  Component Value Date   HGBA1C 6.9* 01/23/2015   Lab Results  Component Value Date   MICROALBUR 1.3 01/23/2015   Lab Results  Component Value Date   CHOL 220* 01/23/2015   HDL 49.40 01/23/2015   LDLCALC 155* 01/23/2015   TRIG 80.0 01/23/2015   CHOLHDL 4 01/23/2015

## 2015-01-28 NOTE — Assessment & Plan Note (Addendum)
Advised to reduce alcohol to 2 glasses of wine per night.   Will initiate workup for other causes indluign fatty liver if no resolution

## 2015-01-28 NOTE — Assessment & Plan Note (Signed)
Smoking cessation instruction/counseling given:  counseled patient on the dangers of tobacco use, advised patient to stop smoking, and reviewed strategies to maximize success 

## 2015-01-28 NOTE — Assessment & Plan Note (Signed)
Based on current lipid profile, the risk of clinically significant CAD is 63% over the next 10 years, using the Framingham risk calculator. The [employer] recommends starting patients aged 64 or higher on moderate intensity statin therapy for LDL between 70-189 and 10 yr risk of CAD > 7.5% ;  and high intensity therapy for anyone with LDL > 190.   He is willing to start therapy.

## 2015-01-28 NOTE — Assessment & Plan Note (Signed)
Well controlled on current regimen. Renal function stable, no changes today. 

## 2015-01-29 ENCOUNTER — Telehealth: Payer: Self-pay

## 2015-01-29 NOTE — Telephone Encounter (Signed)
Received PA for Crestor, completed and faxed back.

## 2015-02-18 ENCOUNTER — Telehealth: Payer: Self-pay | Admitting: Internal Medicine

## 2015-02-18 MED ORDER — ATORVASTATIN CALCIUM 80 MG PO TABS: 80.0000 mg | ORAL_TABLET | Freq: Every day | ORAL | Status: DC

## 2015-02-18 NOTE — Telephone Encounter (Signed)
If he would like to try atorvastatin 80 mg I will print and you can call rx to pharmacy

## 2015-02-18 NOTE — Telephone Encounter (Signed)
Patient came into the office stated he had talked to someone here concerning Crestor which has been denied by insurance but will provide alternative of atorvastatin 40 mg or greater , simvastatin 80 mg, or Caduet of 40 mg or more. PA was filed for Crestor on 01/29/15 but no response has been received by office.

## 2015-02-19 NOTE — Telephone Encounter (Signed)
Patient prefers to talk with Insurance company before trying atorvastatin and see where process, is with the PA for Crestor.

## 2015-02-26 NOTE — Telephone Encounter (Signed)
Patient has discussed medication with his insurance and is now in the appeals process and would like to wait until his appeal is processed.

## 2015-03-04 ENCOUNTER — Other Ambulatory Visit (INDEPENDENT_AMBULATORY_CARE_PROVIDER_SITE_OTHER)

## 2015-03-04 DIAGNOSIS — R748 Abnormal levels of other serum enzymes: Secondary | ICD-10-CM

## 2015-03-04 LAB — HEPATIC FUNCTION PANEL
ALT: 47 U/L (ref 0–53)
AST: 42 U/L — ABNORMAL HIGH (ref 0–37)
Albumin: 4.5 g/dL (ref 3.5–5.2)
Alkaline Phosphatase: 63 U/L (ref 39–117)
BILIRUBIN DIRECT: 0.2 mg/dL (ref 0.0–0.3)
BILIRUBIN TOTAL: 0.8 mg/dL (ref 0.2–1.2)
TOTAL PROTEIN: 7.1 g/dL (ref 6.0–8.3)

## 2015-03-05 ENCOUNTER — Encounter: Payer: Self-pay | Admitting: Internal Medicine

## 2015-03-05 NOTE — Addendum Note (Signed)
Addended by: [name] on: 03/05/2015 05:01 PM   Modules accepted: Orders

## 2015-03-07 ENCOUNTER — Telehealth: Payer: Self-pay | Admitting: Internal Medicine

## 2015-03-07 NOTE — Telephone Encounter (Signed)
Printed [place] order, signed by MD and faxing

## 2015-03-07 NOTE — Telephone Encounter (Signed)
Caryl Pina called from  Ultrasound pt has a appt on 09/12 @8m . The order was changed and it needs to be signed off on by Monday. Thank You!

## 2015-03-10 ENCOUNTER — Ambulatory Visit
Admission: RE | Admit: 2015-03-10 | Discharge: 2015-03-10 | Disposition: A | Discharge status: Home / Self Care | Source: Ambulatory Visit | Attending: Internal Medicine | Admitting: Internal Medicine

## 2015-03-10 DIAGNOSIS — R748 Abnormal levels of other serum enzymes: Secondary | ICD-10-CM | POA: Diagnosis not present

## 2015-03-10 DIAGNOSIS — K760 Fatty (change of) liver, not elsewhere classified: Secondary | ICD-10-CM | POA: Diagnosis not present

## 2015-03-11 ENCOUNTER — Encounter: Payer: Self-pay | Admitting: Internal Medicine

## 2015-03-19 NOTE — Telephone Encounter (Signed)
Mailed unread mychart message  To patient

## 2015-03-25 NOTE — Telephone Encounter (Signed)
Mailed unread message to patient.  

## 2015-03-31 ENCOUNTER — Telehealth: Payer: Self-pay | Admitting: Internal Medicine

## 2015-03-31 DIAGNOSIS — K760 Fatty (change of) liver, not elsewhere classified: Secondary | ICD-10-CM

## 2015-03-31 NOTE — Telephone Encounter (Signed)
Referral is in process as requested.  I would still like him to get the labs I ordered.

## 2015-03-31 NOTE — Telephone Encounter (Signed)
Patient came into office after reading My chart message and would like to schedule an appointment [facility] for fatty liver. Please advise .

## 2015-04-01 NOTE — Telephone Encounter (Signed)
Left message for patient to call office.  

## 2015-04-02 ENCOUNTER — Encounter: Payer: Self-pay | Admitting: Internal Medicine

## 2015-04-02 DIAGNOSIS — E119 Type 2 diabetes mellitus without complications: Secondary | ICD-10-CM

## 2015-04-29 ENCOUNTER — Other Ambulatory Visit

## 2015-05-01 ENCOUNTER — Ambulatory Visit: Admitting: Internal Medicine

## 2015-05-05 ENCOUNTER — Other Ambulatory Visit (INDEPENDENT_AMBULATORY_CARE_PROVIDER_SITE_OTHER)

## 2015-05-05 DIAGNOSIS — R748 Abnormal levels of other serum enzymes: Secondary | ICD-10-CM | POA: Diagnosis not present

## 2015-05-05 LAB — FERRITIN: Ferritin: 77.4 ng/mL (ref 22.0–322.0)

## 2015-05-05 LAB — HEPATITIS B SURFACE ANTIBODY,QUALITATIVE: Hep B S Ab: NEGATIVE

## 2015-05-05 LAB — HEPATITIS B SURFACE ANTIGEN: HEP B S AG: NEGATIVE

## 2015-05-05 LAB — HEPATITIS C ANTIBODY: HCV AB: NEGATIVE

## 2015-05-05 LAB — HEPATITIS B CORE ANTIBODY, TOTAL: Hep B Core Total Ab: NONREACTIVE

## 2015-05-06 LAB — IRON AND TIBC
%SAT: 32 % (ref 15–60)
IRON: 129 ug/dL (ref 50–180)
TIBC: 407 ug/dL (ref 250–425)
UIBC: 278 ug/dL (ref 125–400)

## 2015-05-06 LAB — ANA: Anti Nuclear Antibody(ANA): NEGATIVE

## 2015-05-06 LAB — ANTI-SMITH ANTIBODY: ENA SM AB SER-ACNC: NEGATIVE

## 2015-05-07 ENCOUNTER — Encounter: Payer: Self-pay | Admitting: Internal Medicine

## 2015-05-07 ENCOUNTER — Ambulatory Visit (INDEPENDENT_AMBULATORY_CARE_PROVIDER_SITE_OTHER): Admitting: Internal Medicine

## 2015-05-07 VITALS — BP 140/80 | HR 88 | Temp 98.0°F | Resp 12 | Wt 198.0 lb

## 2015-05-07 DIAGNOSIS — E663 Overweight: Secondary | ICD-10-CM

## 2015-05-07 DIAGNOSIS — K760 Fatty (change of) liver, not elsewhere classified: Secondary | ICD-10-CM | POA: Diagnosis not present

## 2015-05-07 DIAGNOSIS — Z716 Tobacco abuse counseling: Secondary | ICD-10-CM | POA: Diagnosis not present

## 2015-05-07 DIAGNOSIS — E669 Obesity, unspecified: Secondary | ICD-10-CM

## 2015-05-07 DIAGNOSIS — E119 Type 2 diabetes mellitus without complications: Secondary | ICD-10-CM | POA: Diagnosis not present

## 2015-05-07 DIAGNOSIS — Z23 Encounter for immunization: Secondary | ICD-10-CM

## 2015-05-07 DIAGNOSIS — E1169 Type 2 diabetes mellitus with other specified complication: Secondary | ICD-10-CM

## 2015-05-07 LAB — COMPREHENSIVE METABOLIC PANEL
ALK PHOS: 77 U/L (ref 39–117)
ALT: 48 U/L (ref 0–53)
AST: 36 U/L (ref 0–37)
Albumin: 4.5 g/dL (ref 3.5–5.2)
BILIRUBIN TOTAL: 0.8 mg/dL (ref 0.2–1.2)
BUN: 12 mg/dL (ref 6–23)
CALCIUM: 9.8 mg/dL (ref 8.4–10.5)
CO2: 26 mEq/L (ref 19–32)
Chloride: 102 mEq/L (ref 96–112)
Creatinine, Ser: 0.96 mg/dL (ref 0.40–1.50)
GFR: 101.26 mL/min (ref >60.00)
GLUCOSE: 116 mg/dL — AB (ref 70–99)
Potassium: 4.2 mEq/L (ref 3.5–5.1)
Sodium: 136 mEq/L (ref 135–145)
TOTAL PROTEIN: 7.6 g/dL (ref 6.0–8.3)

## 2015-05-07 LAB — HEMOGLOBIN A1C: Hgb A1c MFr Bld: 7.1 % — ABNORMAL HIGH (ref 4.6–6.5)

## 2015-05-07 NOTE — Patient Instructions (Signed)
You received the first of 3 vaccines to protect your from Hepatitis A and B  Your second  dose is due in one month (Dec 9th ,  Around)   Final dose 6 months from today  (May 9)   Nonalcoholic Fatty Liver Disease Diet Nonalcoholic fatty liver disease is a condition that causes fat to accumulate in and around the liver. The disease makes it harder for the liver to work the way that it should. Following a healthy diet can help to keep nonalcoholic fatty liver disease under control. It can also help to prevent or improve conditions that are associated with the disease, such as heart disease, diabetes, high blood pressure, and abnormal cholesterol levels. Along with regular exercise, this diet:  Promotes weight loss.  Helps to control blood sugar levels.  Helps to improve the way that the body uses insulin. WHAT DO I NEED TO KNOW ABOUT THIS DIET?  Use the glycemic index (GI) to plan your meals. The index tells you how quickly a food will raise your blood sugar. Choose low-GI foods. These foods take a longer time to raise blood sugar.  Keep track of how many calories you take in. Eating the right amount of calories will help you to achieve a healthy weight.  You may want to follow a Mediterranean diet. This diet includes a lot of vegetables, lean meats or fish, whole grains, fruits, and healthy oils and fats. WHAT FOODS CAN I EAT? Grains Whole grains, such as whole-wheat or whole-grain breads, crackers, tortillas, cereals, and pasta. Stone-ground whole wheat. Pumpernickel bread. Unsweetened oatmeal. Bulgur. Barley. Quinoa. Brown or wild rice. Corn or whole-wheat flour tortillas. Vegetables Lettuce. Spinach. Peas. Beets. Cauliflower. Cabbage. Broccoli. Carrots. Tomatoes. Squash. Eggplant. Herbs. Peppers. Onions. Cucumbers. Brussels sprouts. Yams and sweet potatoes. Beans. Lentils. Fruits Bananas. Apples. Oranges. Grapes. Papaya. Mango. Pomegranate. Kiwi. Grapefruit. Cherries. Meats and Other  Protein Sources Seafood and shellfish. Lean meats. Poultry. Tofu. Dairy Low-fat or fat-free dairy products, such as yogurt, cottage cheese, and cheese. Beverages Water. Sugar-free drinks. Tea. Coffee. Low-fat or skim milk. Milk alternatives, such as soy or almond milk. Real fruit juice. Condiments Mustard. Relish. Low-fat, low-sugar ketchup and barbecue sauce. Low-fat or fat-free mayonnaise. Sweets and Desserts Sugar-free sweets. Fats and Oils Avocado. Canola or olive oil. Nuts and nut butters. Seeds. The items listed above may not be a complete list of recommended foods or beverages. Contact your dietitian for more options.  WHAT FOODS ARE NOT RECOMMENDED? Palm oil and coconut oil. Processed foods. Fried foods. Sweetened drinks, such as sweet tea, milkshakes, snow cones, iced sweet drinks, and sodas. Alcohol. Sweets. Foods that contain a lot of salt or sodium. The items listed above may not be a complete list of foods and beverages to avoid. Contact your dietitian for more information.   This information is not intended to replace advice given to you by your health care provider. Make sure you discuss any questions you have with your health care provider.   Document Released: 10/29/2014 Document Reviewed: 10/29/2014 Elsevier Interactive Patient Education [employer].

## 2015-05-07 NOTE — Progress Notes (Signed)
Subjective:  Patient ID: Robert Lat., male    DOB: [birth year]  Age: 64 y.o. MRN: [id number]  CC: The primary encounter diagnosis was Encounter for immunization. Diagnoses of Diabetes mellitus without complication ([place]), Need for hepatitis A and B vaccination, Diabetes mellitus type 2 in obese ([place]), Hepatic steatosis, Tobacco abuse counseling, and Patient overweight were also pertinent to this visit.  HPI Robert Robertson. presents for follow up on htn, lipids,  Type 2 DM and fatty liver  Has been taking a statin since his last visit.  No side effects experienced.   Fatty Liver: suspected given ultrasound and other non diagnostic labs.  [facility] with [facility].  Hep A and B vaccines discussed. He has reduced his alcohol intake to 2 libations per night  Type 2 DM:  .[name] He has been checking his BS once daily   7 day average 140,.  He is exercising regularly and avoiding high glycemic index foods.    Tobacco abuse:  He reports that he quit smoking a week ago.  Doing fine  ,  No cravings.  Quit cold [place].    Outpatient Prescriptions Prior to Visit  Medication Sig Dispense Refill  . aspirin 81 MG tablet Take 81 mg by mouth daily.    [name] atorvastatin (LIPITOR) 80 MG tablet Take 1 tablet (80 mg total) by mouth daily. 90 tablet 3  . Cinnamon 500 MG capsule Take 1,000 mg by mouth daily.     [name] FREESTYLE LITE test strip CHECK SUGARS TWICE A DAY 200 each 1  . Krill Oil 300 MG CAPS Take 1 capsule by mouth daily.    . Lancets (FREESTYLE) lancets Check sugar bid 200 each 1  . lisinopril (PRINIVIL,ZESTRIL) 10 MG tablet Take 1 tablet (10 mg total) by mouth daily. 90 tablet 3  . Multiple Vitamins-Minerals (CENTRUM SILVER ADULT 50+ PO) Take 1 tablet by mouth daily.     No facility-administered medications prior to visit.    Review of Systems;  Patient denies headache, fevers, malaise, unintentional weight loss, skin rash, eye pain, sinus congestion and sinus pain, sore throat,  dysphagia,  hemoptysis , cough, dyspnea, wheezing, chest pain, palpitations, orthopnea, edema, abdominal pain, nausea, melena, diarrhea, constipation, flank pain, dysuria, hematuria, urinary  Frequency, nocturia, numbness, tingling, seizures,  Focal weakness, Loss of consciousness,  Tremor, insomnia, depression, anxiety, and suicidal ideation.      Objective:  BP 140/80 mmHg  Pulse 88  Temp(Src) 98 F (36.7 C)  Resp 12  Wt 198 lb (89.812 kg)  SpO2 96%  BP Readings from Last 3 Encounters:  05/07/15 140/80  01/27/15 130/82  11/12/14 128/76    Wt Readings from Last 3 Encounters:  05/07/15 198 lb (89.812 kg)  01/27/15 197 lb 8 oz (89.585 kg)  11/12/14 193 lb (87.544 kg)    General appearance: alert, cooperative and appears stated age Ears: normal TM's and external ear canals both ears Throat: lips, mucosa, and tongue normal; teeth and gums normal Neck: no adenopathy, no carotid bruit, supple, symmetrical, trachea midline and thyroid not enlarged, symmetric, no tenderness/mass/nodules Back: symmetric, no curvature. ROM normal. No CVA tenderness. Lungs: clear to auscultation bilaterally Heart: regular rate and rhythm, S1, S2 normal, no murmur, click, rub or gallop Abdomen: soft, non-tender; bowel sounds normal; no masses,  no organomegaly Pulses: 2+ and symmetric Skin: Skin color, texture, turgor normal. No rashes or lesions Lymph nodes: Cervical, supraclavicular, and axillary nodes normal.  Lab Results  Component Value  Date   HGBA1C 7.1* 05/07/2015   HGBA1C 6.9* 01/23/2015   HGBA1C 7.5* 08/29/2014    Lab Results  Component Value Date   CREATININE 0.96 05/07/2015   CREATININE 0.96 01/23/2015   CREATININE 0.99 08/29/2014    Lab Results  Component Value Date   WBC 6.1 01/11/2013   HGB 15.0 01/11/2013   HCT 48.0 01/11/2013   PLT 154 02/28/2012   GLUCOSE 116* 05/07/2015   CHOL 220* 01/23/2015   TRIG 80.0 01/23/2015   HDL 49.40 01/23/2015   LDLCALC 155* 01/23/2015    ALT 48 05/07/2015   AST 36 05/07/2015   NA 136 05/07/2015   K 4.2 05/07/2015   CL 102 05/07/2015   CREATININE 0.96 05/07/2015   BUN 12 05/07/2015   CO2 26 05/07/2015   PSA 2.00 05/14/2014   INR 0.99 02/29/2012   HGBA1C 7.1* 05/07/2015   MICROALBUR 1.3 01/23/2015    US Abdomen Limited Ruq  03/10/2015  CLINICAL DATA:  Elevated liver enzymes EXAM: US ABDOMEN LIMITED - RIGHT UPPER QUADRANT COMPARISON:  None. FINDINGS: Gallbladder: No gallstones or wall thickening visualized. There is no pericholecystic fluid. No sonographic Murphy sign noted. Common bile duct: Diameter: 4 mm. There is no intrahepatic or extrahepatic biliary duct dilatation. Liver: No focal lesion identified. Liver echogenicity is diffusely increased. IMPRESSION: Liver echogenicity is diffusely increased. This finding is most likely indicative of hepatic steatosis. While no focal liver lesions are identified, it must be cautioned that sensitive ultrasound for focal liver lesions is diminished in this circumstance. Study otherwise unremarkable. Electronically Signed   By: Lowella Grip III M.D.   On: 03/10/2015 08:34    Assessment & Plan:   Problem List Items Addressed This Visit    Diabetes mellitus type 2 in obese [facility])    Currently well-controlled on diet alone, although hemoglobin A1c has risen slightly to 7.1. Patient is reminded to schedule an annual eye exam and foot exam is unchanged today. Patient has no microalbuminuria. Patient has resumed statin therapy for CAD risk reduction and on ACE/ARB for renal protection and hypertension  .  Lab Results  Component Value Date   HGBA1C 7.1* 05/07/2015   Lab Results  Component Value Date   MICROALBUR 1.3 01/23/2015   Lab Results  Component Value Date   CHOL 220* 01/23/2015   HDL 49.40 01/23/2015   LDLCALC 155* 01/23/2015   TRIG 80.0 01/23/2015   CHOLHDL 4 01/23/2015           Tobacco abuse counseling    Smoking cessation instruction/counseling given: spent  5 minutes counseling patient;   commended patient for quitting and reviewed strategies for preventing relapses      Patient overweight    I have addressed  BMI and recommended wt loss of 10% of body weight over the next 6 months using a low glycemic index diet and regular exercise a minimum of 5 days per week.        Hepatic steatosis    Suggested by ultrasound in September.  Serologies for iron overload ,  autoimmune and viral infections were  negative. Reminded to limit  alcohol to 2 glasses of wine per night.   Referral to Liver clinic is in process.  Vaccinations for Hepatitis A and B are underway and statin therapy was resumed after last visit,  Current enzymes are normal.   Lab Results  Component Value Date   ALT 48 05/07/2015   AST 36 05/07/2015   ALKPHOS 77 05/07/2015   BILITOT  0.8 05/07/2015            Other Visit Diagnoses    Encounter for immunization    -  Primary    Diabetes mellitus without complication ([place])        Relevant Orders    Comprehensive metabolic panel (Completed)    Hemoglobin A1c (Completed)    Need for hepatitis A and B vaccination        Relevant Orders    Hepatitis A hepatitis B combined vaccine IM (Completed)       I am having Robert Robertson maintain his aspirin, Krill Oil, lisinopril, freestyle, Multiple Vitamins-Minerals (CENTRUM SILVER ADULT 50+ PO), FREESTYLE LITE, Cinnamon, and atorvastatin.  No orders of the defined types were placed in this encounter.    There are no discontinued medications.  Follow-up: Return in about 3 months (around 08/07/2015) for follow up diabetes.   [name], MD

## 2015-05-07 NOTE — Progress Notes (Signed)
Pre visit review using our clinic review tool, if applicable. No additional management support is needed unless otherwise documented below in the visit note. 

## 2015-05-08 ENCOUNTER — Encounter: Payer: Self-pay | Admitting: Internal Medicine

## 2015-05-08 NOTE — Assessment & Plan Note (Signed)
Currently well-controlled on diet alone, although hemoglobin A1c has risen slightly to 7.1. Patient is reminded to schedule an annual eye exam and foot exam is unchanged today. Patient has no microalbuminuria. Patient has resumed statin therapy for CAD risk reduction and on ACE/ARB for renal protection and hypertension  .  Lab Results  Component Value Date   HGBA1C 7.1* 05/07/2015   Lab Results  Component Value Date   MICROALBUR 1.3 01/23/2015   Lab Results  Component Value Date   CHOL 220* 01/23/2015   HDL 49.40 01/23/2015   LDLCALC 155* 01/23/2015   TRIG 80.0 01/23/2015   CHOLHDL 4 01/23/2015

## 2015-05-08 NOTE — Assessment & Plan Note (Addendum)
Smoking cessation instruction/counseling given: spent 5 minutes counseling patient;   commended patient for quitting and reviewed strategies for preventing relapses

## 2015-05-08 NOTE — Assessment & Plan Note (Addendum)
Suggested by ultrasound in September.  Serologies for iron overload ,  autoimmune and viral infections were  negative. Reminded to limit  alcohol to 2 glasses of wine per night.   Referral to Liver clinic is in process.  Vaccinations for Hepatitis A and B are underway and statin therapy was resumed after last visit,  Current enzymes are normal.   Lab Results  Component Value Date   ALT 48 05/07/2015   AST 36 05/07/2015   ALKPHOS 77 05/07/2015   BILITOT 0.8 05/07/2015

## 2015-05-08 NOTE — Assessment & Plan Note (Signed)
I have addressed  BMI and recommended wt loss of 10% of body weight over the next 6 months using a low glycemic index diet and regular exercise a minimum of 5 days per week.   

## 2015-05-09 ENCOUNTER — Encounter: Payer: Self-pay | Admitting: Internal Medicine

## 2015-05-24 IMAGING — US US ABDOMEN LIMITED
1 series · 14 of 25 positions shown · non-contrast
Comparison: None.

CLINICAL DATA: Elevated liver enzymes

EXAM:
US ABDOMEN LIMITED - RIGHT UPPER QUADRANT

[Series 1: us abdomen limited · 0.25mm/px · 14 of 64 slices shown]
[im 1/64]
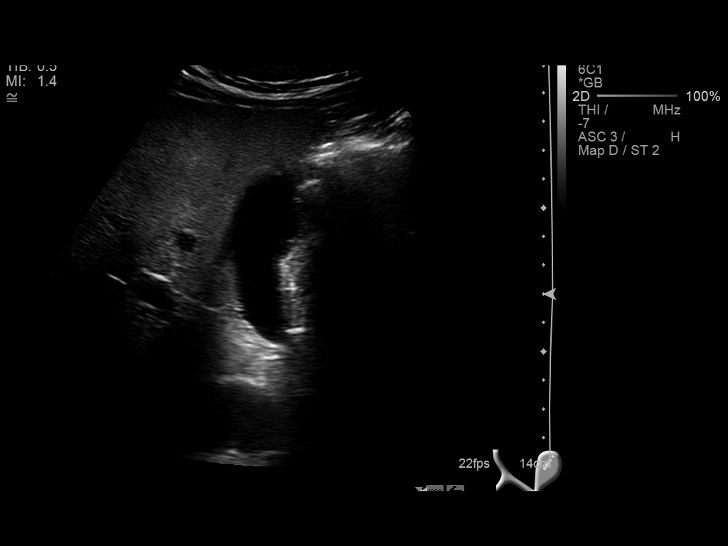
[im 6/64]
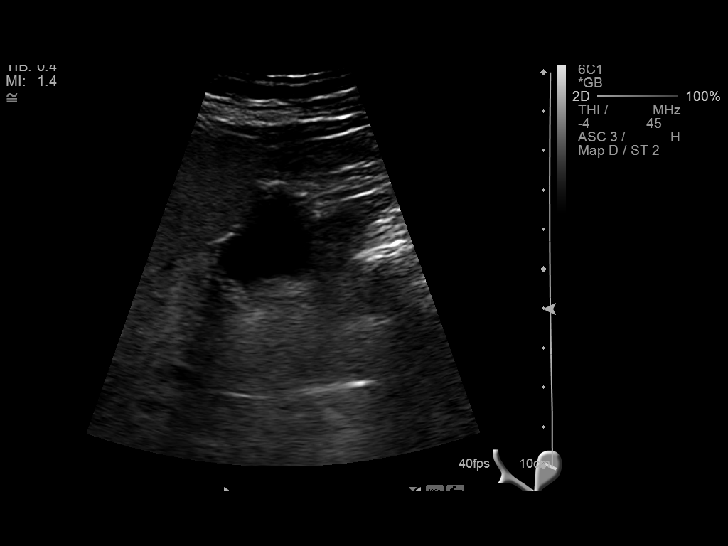
[im 11/64]
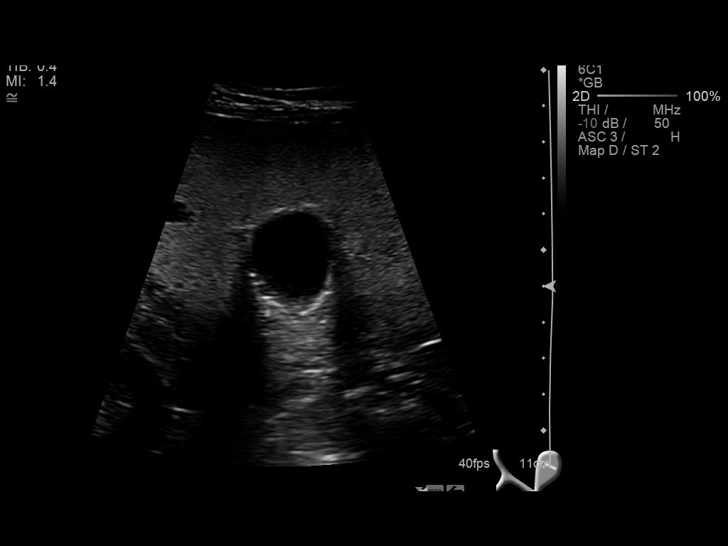
[im 16/64]
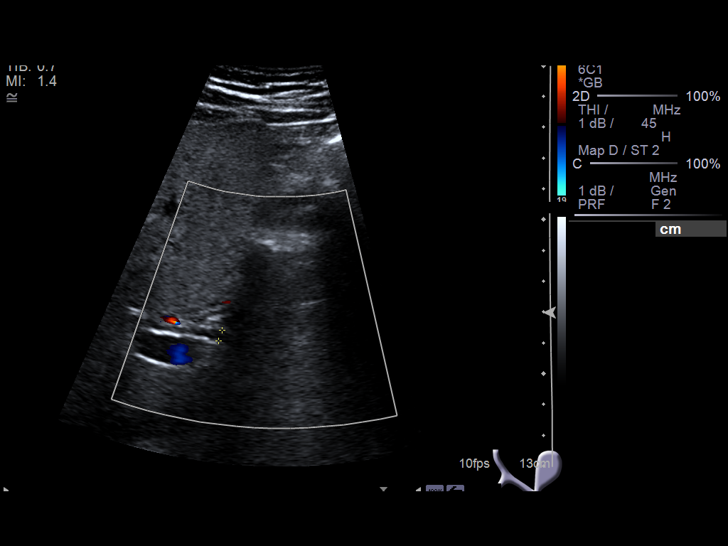
[im 22/64]
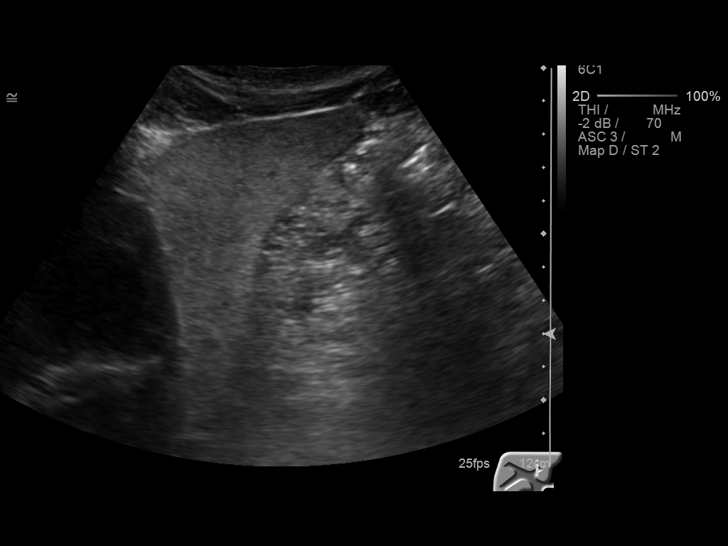
[im 24/64]
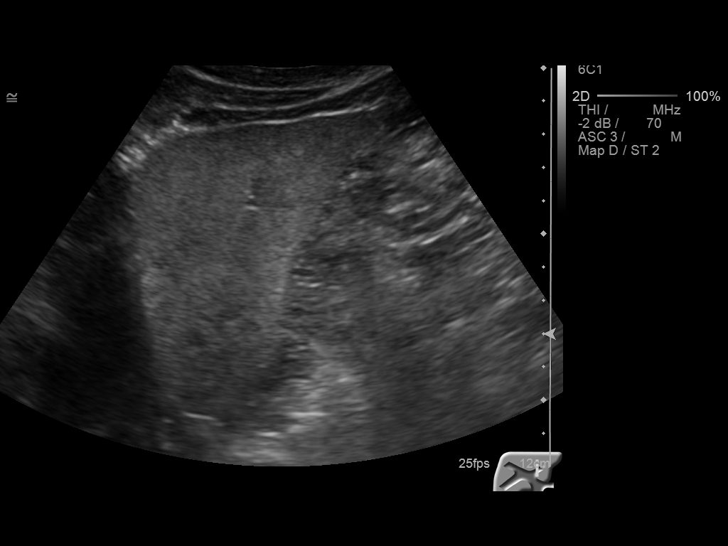
[im 29/64]
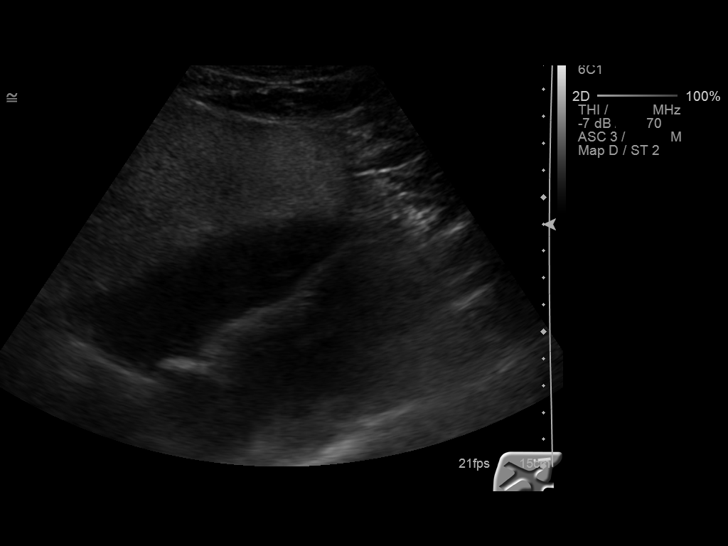
[im 35/64]
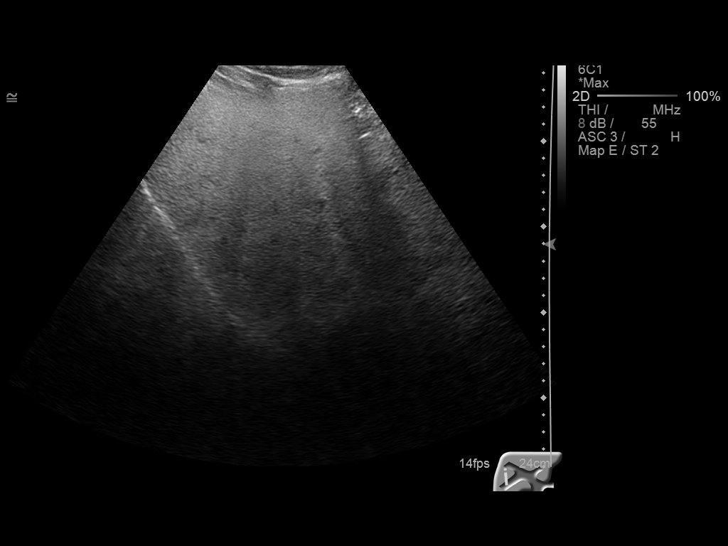
[im 40/64]
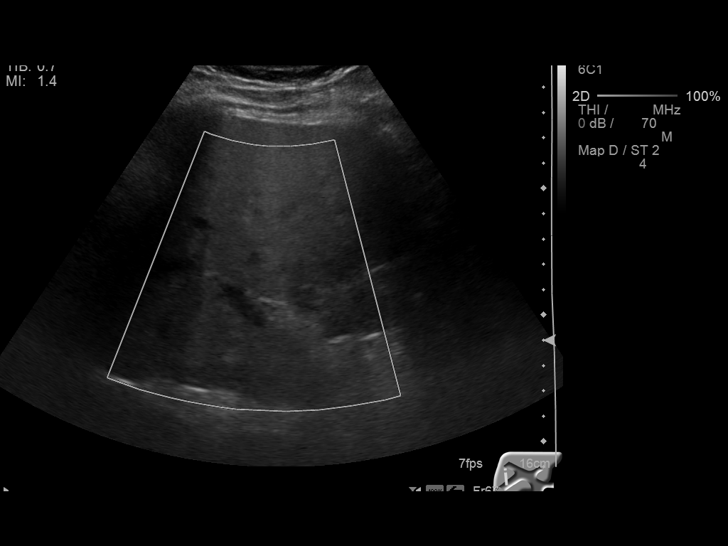
[im 43/64]
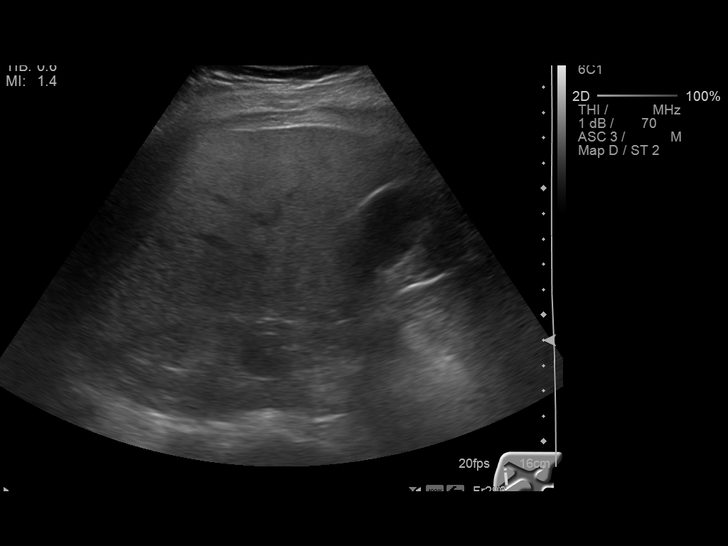
[im 48/64]
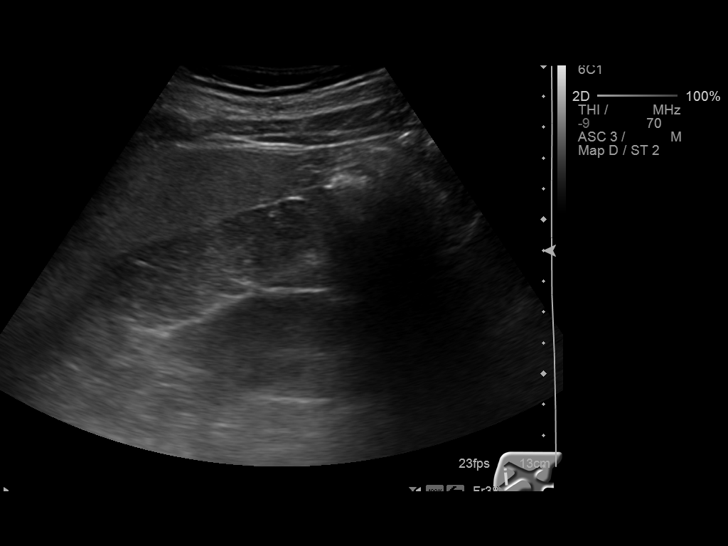
[im 53/64]
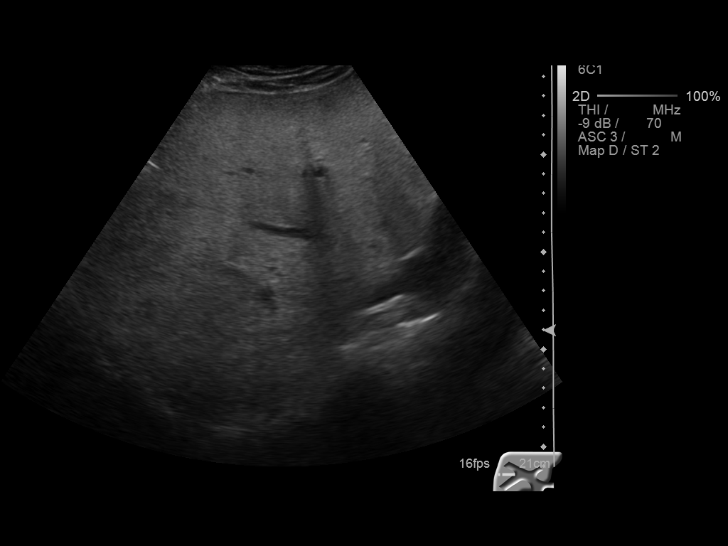
[im 58/64]
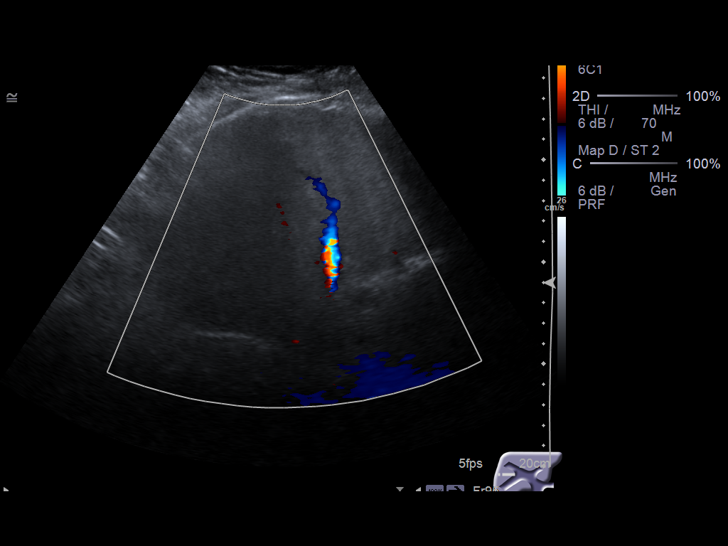
[im 64/64]
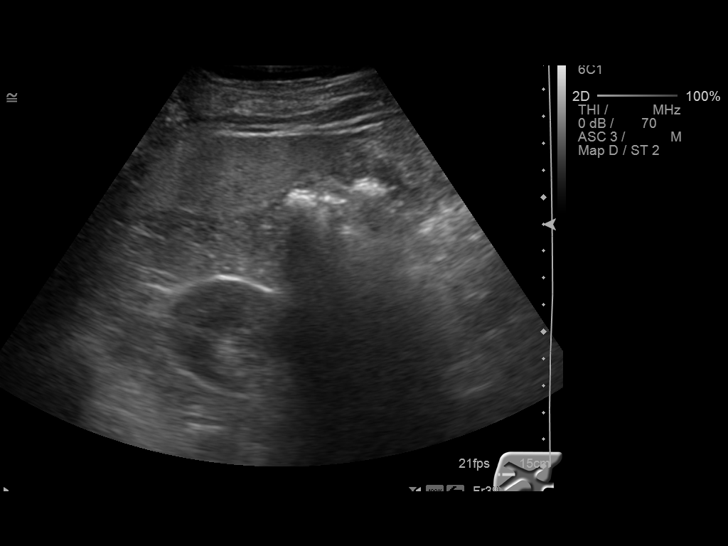

[14 of 25 positions shown; findings below may reference images not displayed]

FINDINGS: Gallbladder:

No gallstones or wall thickening visualized. There is no
pericholecystic fluid. No sonographic Murphy sign noted.

Common bile duct:

Diameter: 4 mm. There is no intrahepatic or extrahepatic biliary
duct dilatation.

Liver:

No focal lesion identified. Liver echogenicity is diffusely
increased.
IMPRESSION: Liver echogenicity is diffusely increased. This finding is most
likely indicative of hepatic steatosis. While no focal liver lesions
are identified, it must be cautioned that sensitive ultrasound for
focal liver lesions is diminished in this circumstance. Study
otherwise unremarkable.

## 2015-06-09 ENCOUNTER — Other Ambulatory Visit: Payer: Self-pay | Admitting: Internal Medicine

## 2015-06-10 ENCOUNTER — Ambulatory Visit (INDEPENDENT_AMBULATORY_CARE_PROVIDER_SITE_OTHER): Admitting: *Deleted

## 2015-06-10 DIAGNOSIS — Z23 Encounter for immunization: Secondary | ICD-10-CM

## 2015-08-01 ENCOUNTER — Other Ambulatory Visit

## 2015-08-04 ENCOUNTER — Ambulatory Visit: Admitting: Internal Medicine

## 2015-08-07 ENCOUNTER — Ambulatory Visit: Admitting: Internal Medicine

## 2015-08-08 ENCOUNTER — Telehealth: Payer: Self-pay | Admitting: *Deleted

## 2015-08-08 ENCOUNTER — Other Ambulatory Visit (INDEPENDENT_AMBULATORY_CARE_PROVIDER_SITE_OTHER)

## 2015-08-08 DIAGNOSIS — E785 Hyperlipidemia, unspecified: Secondary | ICD-10-CM

## 2015-08-08 DIAGNOSIS — E1169 Type 2 diabetes mellitus with other specified complication: Secondary | ICD-10-CM

## 2015-08-08 DIAGNOSIS — E119 Type 2 diabetes mellitus without complications: Secondary | ICD-10-CM | POA: Diagnosis not present

## 2015-08-08 DIAGNOSIS — E669 Obesity, unspecified: Secondary | ICD-10-CM | POA: Diagnosis not present

## 2015-08-08 DIAGNOSIS — D126 Benign neoplasm of colon, unspecified: Secondary | ICD-10-CM

## 2015-08-08 LAB — LIPID PANEL
CHOLESTEROL: 158 mg/dL (ref 0–200)
HDL: 54.5 mg/dL (ref >39.00)
LDL CALC: 93 mg/dL (ref 0–99)
NonHDL: 103.33
TRIGLYCERIDES: 52 mg/dL (ref 0.0–149.0)
Total CHOL/HDL Ratio: 3
VLDL: 10.4 mg/dL (ref 0.0–40.0)

## 2015-08-08 LAB — COMPREHENSIVE METABOLIC PANEL
ALK PHOS: 74 U/L (ref 39–117)
ALT: 44 U/L (ref 0–53)
AST: 35 U/L (ref 0–37)
Albumin: 4.3 g/dL (ref 3.5–5.2)
BILIRUBIN TOTAL: 1.1 mg/dL (ref 0.2–1.2)
BUN: 14 mg/dL (ref 6–23)
CO2: 30 mEq/L (ref 19–32)
CREATININE: 0.87 mg/dL (ref 0.40–1.50)
Calcium: 9.3 mg/dL (ref 8.4–10.5)
Chloride: 103 mEq/L (ref 96–112)
GFR: 113.36 mL/min (ref >60.00)
GLUCOSE: 126 mg/dL — AB (ref 70–99)
Potassium: 4.2 mEq/L (ref 3.5–5.1)
SODIUM: 138 meq/L (ref 135–145)
TOTAL PROTEIN: 7.1 g/dL (ref 6.0–8.3)

## 2015-08-08 LAB — MICROALBUMIN / CREATININE URINE RATIO
Creatinine,U: 149.1 mg/dL
Microalb Creat Ratio: 0.5 mg/g (ref 0.0–30.0)
Microalb, Ur: 0.7 mg/dL (ref 0.0–1.9)

## 2015-08-08 LAB — HEMOGLOBIN A1C: Hgb A1c MFr Bld: 7.5 % — ABNORMAL HIGH (ref 4.6–6.5)

## 2015-08-08 LAB — LDL CHOLESTEROL, DIRECT: LDL DIRECT: 91 mg/dL

## 2015-08-08 NOTE — Telephone Encounter (Signed)
Labs and dx?  

## 2015-08-08 NOTE — Telephone Encounter (Signed)
Thank you :)

## 2015-08-08 NOTE — Telephone Encounter (Signed)
Got a urine sample

## 2015-08-15 ENCOUNTER — Ambulatory Visit (INDEPENDENT_AMBULATORY_CARE_PROVIDER_SITE_OTHER): Admitting: Internal Medicine

## 2015-08-15 ENCOUNTER — Telehealth: Payer: Self-pay | Admitting: *Deleted

## 2015-08-15 ENCOUNTER — Encounter: Payer: Self-pay | Admitting: Internal Medicine

## 2015-08-15 VITALS — BP 130/80 | HR 96 | Temp 98.8°F | Ht 68.0 in | Wt 197.2 lb

## 2015-08-15 DIAGNOSIS — E119 Type 2 diabetes mellitus without complications: Secondary | ICD-10-CM

## 2015-08-15 DIAGNOSIS — Z23 Encounter for immunization: Secondary | ICD-10-CM | POA: Diagnosis not present

## 2015-08-15 DIAGNOSIS — Z716 Tobacco abuse counseling: Secondary | ICD-10-CM | POA: Diagnosis not present

## 2015-08-15 DIAGNOSIS — I10 Essential (primary) hypertension: Secondary | ICD-10-CM

## 2015-08-15 DIAGNOSIS — E785 Hyperlipidemia, unspecified: Secondary | ICD-10-CM

## 2015-08-15 DIAGNOSIS — E1169 Type 2 diabetes mellitus with other specified complication: Secondary | ICD-10-CM

## 2015-08-15 DIAGNOSIS — E669 Obesity, unspecified: Secondary | ICD-10-CM

## 2015-08-15 NOTE — Telephone Encounter (Signed)
That is fine. Thanks 

## 2015-08-15 NOTE — Progress Notes (Signed)
Pre visit review using our clinic review tool, if applicable. No additional management support is needed unless otherwise documented below in the visit note. 

## 2015-08-15 NOTE — Progress Notes (Signed)
Subjective:  Patient ID: Robert Robertson., male    DOB: 1950-07-21  Age: 65 y.o. MRN: [id number]  CC: The primary encounter diagnosis was Need for prophylactic vaccination against Streptococcus pneumoniae (pneumococcus). Diagnoses of Diabetes mellitus type 2 in obese (Robert Robertson), Hyperlipidemia, Essential hypertension, and Tobacco abuse counseling were also pertinent to this visit.  HPI [name]. presents for .3 month follow up on diabetes.  Patient has no complaints today.  Patient is following a low glycemic index diet and taking all prescribed medications regularly without side effects.  Fasting sugars have been under less than 140 most of the time and post prandials have been under 160 except on rare occasions. Patient is exercising about 3 times per week and intentionally trying to lose weight .  Patient has had an eye exam in the last 12 months and checks feet regularly for signs of infection.  Patient does not walk barefoot outside,  And denies an numbness tingling or burning in feet. Patient is up to date on all recommended vaccinations   He quit smoking recently.  His wife continues to smoke..   bs random chekcd high 179,  Low 112   Lab Results  Component Value Date   HGBA1C 7.5* 08/08/2015    Plans to join [employer]   Outpatient Prescriptions Prior to Visit  Medication Sig Dispense Refill  . aspirin 81 MG tablet Take 81 mg by mouth daily.    [name] atorvastatin (LIPITOR) 80 MG tablet Take 1 tablet (80 mg total) by mouth daily. 90 tablet 3  . Cinnamon 500 MG capsule Take 1,000 mg by mouth daily.     [name] FREESTYLE LITE test strip CHECK SUGARS TWICE A DAY 200 each 1  . Krill Oil 300 MG CAPS Take 1 capsule by mouth daily.    . Lancets (FREESTYLE) lancets Check sugar bid 200 each 1  . lisinopril (PRINIVIL,ZESTRIL) 10 MG tablet TAKE 1 TABLET DAILY 90 tablet 3  . Multiple Vitamins-Minerals (CENTRUM SILVER ADULT 50+ PO) Take 1 tablet by mouth daily.     No facility-administered  medications prior to visit.    Review of Systems;  Patient denies headache, fevers, malaise, unintentional weight loss, skin rash, eye pain, sinus congestion and sinus pain, sore throat, dysphagia,  hemoptysis , cough, dyspnea, wheezing, chest pain, palpitations, orthopnea, edema, abdominal pain, nausea, melena, diarrhea, constipation, flank pain, dysuria, hematuria, urinary  Frequency, nocturia, numbness, tingling, seizures,  Focal weakness, Loss of consciousness,  Tremor, insomnia, depression, anxiety, and suicidal ideation.      Objective:  BP 130/80 mmHg  Pulse 96  Temp(Src) 98.8 F (37.1 C) (Oral)  Ht 5\' 8"  (1.727 m)  Wt 197 lb 4 oz (89.472 kg)  BMI 30.00 kg/m2  SpO2 96%  BP Readings from Last 3 Encounters:  08/15/15 130/80  05/07/15 140/80  01/27/15 130/82    Wt Readings from Last 3 Encounters:  08/15/15 197 lb 4 oz (89.472 kg)  05/07/15 198 lb (89.812 kg)  01/27/15 197 lb 8 oz (89.585 kg)    General appearance: alert, cooperative and appears stated age Ears: normal TM's and external ear canals both ears Throat: lips, mucosa, and tongue normal; teeth and gums normal Neck: no adenopathy, no carotid bruit, supple, symmetrical, trachea midline and thyroid not enlarged, symmetric, no tenderness/mass/nodules Back: symmetric, no curvature. ROM normal. No CVA tenderness. Lungs: clear to auscultation bilaterally Heart: regular rate and rhythm, S1, S2 normal, no murmur, click, rub or gallop Abdomen: soft, non-tender; bowel  sounds normal; no masses,  no organomegaly Pulses: 2+ and symmetric Skin: Skin color, texture, turgor normal. No rashes or lesions Lymph nodes: Cervical, supraclavicular, and axillary nodes normal.  Lab Results  Component Value Date   HGBA1C 7.5* 08/08/2015   HGBA1C 7.1* 05/07/2015   HGBA1C 6.9* 01/23/2015    Lab Results  Component Value Date   CREATININE 0.87 08/08/2015   CREATININE 0.96 05/07/2015   CREATININE 0.96 01/23/2015    Lab Results    Component Value Date   WBC 6.1 01/11/2013   HGB 15.0 01/11/2013   HCT 48.0 01/11/2013   PLT 154 02/28/2012   GLUCOSE 126* 08/08/2015   CHOL 158 08/08/2015   TRIG 52.0 08/08/2015   HDL 54.50 08/08/2015   LDLDIRECT 91.0 08/08/2015   LDLCALC 93 08/08/2015   ALT 44 08/08/2015   AST 35 08/08/2015   NA 138 08/08/2015   K 4.2 08/08/2015   CL 103 08/08/2015   CREATININE 0.87 08/08/2015   BUN 14 08/08/2015   CO2 30 08/08/2015   PSA 2.00 05/14/2014   INR 0.99 02/29/2012   HGBA1C 7.5* 08/08/2015   MICROALBUR 0.7 08/08/2015    US Abdomen Limited Ruq  03/10/2015  CLINICAL DATA:  Elevated liver enzymes EXAM: US ABDOMEN LIMITED - RIGHT UPPER QUADRANT COMPARISON:  None. FINDINGS: Gallbladder: No gallstones or wall thickening visualized. There is no pericholecystic fluid. No sonographic Murphy sign noted. Common bile duct: Diameter: 4 mm. There is no intrahepatic or extrahepatic biliary duct dilatation. Liver: No focal lesion identified. Liver echogenicity is diffusely increased. IMPRESSION: Liver echogenicity is diffusely increased. This finding is most likely indicative of hepatic steatosis. While no focal liver lesions are identified, it must be cautioned that sensitive ultrasound for focal liver lesions is diminished in this circumstance. Study otherwise unremarkable. Electronically Signed   By: Lowella Grip III M.D.   On: 03/10/2015 08:34    Assessment & Plan:   Problem List Items Addressed This Visit    Essential hypertension    Well controlled on current regimen. Renal function stable, no changes today.  Lab Results  Component Value Date   CREATININE 0.87 08/08/2015   Lab Results  Component Value Date   NA 138 08/08/2015   K 4.2 08/08/2015   CL 103 08/08/2015   CO2 30 08/08/2015         Diabetes mellitus type 2 in obese (Robert Robertson)    Continued  loss of control on diet alone,  hemoglobin A1c has risen  to 7.5. Patient is referred to [facility] for  an annual eye exam and  foot exam is unchanged today. Patient has no microalbuminuria. Patient has resumed statin therapy for CAD risk reduction and on ACE/ARB for renal protection and hypertension . Dietary changes recommended .  Lab Results  Component Value Date   HGBA1C 7.5* 08/08/2015   Lab Results  Component Value Date   MICROALBUR 0.7 08/08/2015   Lab Results  Component Value Date   CHOL 158 08/08/2015   HDL 54.50 08/08/2015   LDLCALC 93 08/08/2015   LDLDIRECT 91.0 08/08/2015   TRIG 52.0 08/08/2015   CHOLHDL 3 08/08/2015             Tobacco abuse counseling    Smoking cessation instruction/counseling given:  commended patient for quitting and reviewed strategies for preventing relapses      Hyperlipidemia    LDL and triglycerides are at goal on current medications. He has no side effects and liver enzymes are normal. No changes  today  Lab Results  Component Value Date   CHOL 158 08/08/2015   HDL 54.50 08/08/2015   LDLCALC 93 08/08/2015   LDLDIRECT 91.0 08/08/2015   TRIG 52.0 08/08/2015   CHOLHDL 3 08/08/2015   Lab Results  Component Value Date   ALT 44 08/08/2015   AST 35 08/08/2015   ALKPHOS 74 08/08/2015   BILITOT 1.1 08/08/2015          Other Visit Diagnoses    Need for prophylactic vaccination against Streptococcus pneumoniae (pneumococcus)    -  Primary    Relevant Orders    Pneumococcal conjugate vaccine 13-valent (Completed)       I am having Mr. Scheiber maintain his aspirin, Krill Oil, freestyle, Multiple Vitamins-Minerals (CENTRUM SILVER ADULT 50+ PO), FREESTYLE LITE, Cinnamon, atorvastatin, and lisinopril.  No orders of the defined types were placed in this encounter.    There are no discontinued medications.  Follow-up: Return in about 3 months (around 11/12/2015) for follow up diabetes.   [name], MD

## 2015-08-15 NOTE — Telephone Encounter (Signed)
Patient will; be seen on 11/14/15 for a follow up appt. He stated that he will need fasting labs before appt. I scheduled him for 11/07/15 for labs. Please advise if this appt will be appropriate.

## 2015-08-15 NOTE — Patient Instructions (Addendum)
CONGRATULATIONS ON QUITTING SMOKING!!    2) Regarding your diabetes management :  We have decent tasting  substitutions for your potatoes and for rice !!  Try the mashed cauliflower and riced cauliflower dishes instead of rice and mashed potatoes  Mashed turnips are also very low carb!    Instead of ice cream: Try Oikos Triple Zero [place] Yogurt in the salted caramel, and the coffee flavors .  Or the Dannon Lt n fit grekk yogurt in [place] and strawberry cheesecake flavors. Top with  With Whipped Cream for dessert   Use bananas, pineapple and watermelon  in moderation  Because of the sugar.   Berries are better choices for snacking and smoothies bc they are lower carb.   For low carb crunchy chips /crackers:  Try baking the Joseph's pita bread  And Lavash bread at 225 for 90 minutes.  brush with olive oil and spices . Makes  great low carb pita chips  Exercising after your biggest meal will help lower your blood sugars   Eye referral is under way to [place]   2)  You have a viral syndrome .      post nasal drip may also  be contributing to  your  Cough and yoru blood streaked sputum.  If the sputum continues to have blood specks in it after your other symptoms resolve, you should allow me to order a chest  ray.  II also advise use of the following OTC meds to help with your other symptoms.   Take generic OTC benadryl 25 mg  At bedtime for the drainage,,  Delsym for daytime cough. And Milta Deiters Med;s sinus rinse once or twice daily to flush your sinuses  If the coughing has not impoved in  48 hours  OR  if you develop T > 100.4,  Green nasal discharge,  Or facial pain, start the antibiotic.  Please take a probiotic ( Align, Flora[place] or Culturelle) while you are on the antibiotic to prevent a serious antibiotic associated diarrhea  Called clostridium dificile colitis

## 2015-08-17 NOTE — Assessment & Plan Note (Addendum)
Continued  loss of control on diet alone,  hemoglobin A1c has risen  to 7.5. Patient is referred to [facility] for  an annual eye exam and foot exam is unchanged today. Patient has no microalbuminuria. Patient has resumed statin therapy for CAD risk reduction and on ACE/ARB for renal protection and hypertension . Dietary changes recommended .  Lab Results  Component Value Date   HGBA1C 7.5* 08/08/2015   Lab Results  Component Value Date   MICROALBUR 0.7 08/08/2015   Lab Results  Component Value Date   CHOL 158 08/08/2015   HDL 54.50 08/08/2015   LDLCALC 93 08/08/2015   LDLDIRECT 91.0 08/08/2015   TRIG 52.0 08/08/2015   CHOLHDL 3 08/08/2015

## 2015-08-17 NOTE — Assessment & Plan Note (Signed)
Well controlled on current regimen. Renal function stable, no changes today.  Lab Results  Component Value Date   CREATININE 0.87 08/08/2015   Lab Results  Component Value Date   NA 138 08/08/2015   K 4.2 08/08/2015   CL 103 08/08/2015   CO2 30 08/08/2015

## 2015-08-17 NOTE — Assessment & Plan Note (Signed)
LDL and triglycerides are at goal on current medications. He has no side effects and liver enzymes are normal. No changes today  Lab Results  Component Value Date   CHOL 158 08/08/2015   HDL 54.50 08/08/2015   LDLCALC 93 08/08/2015   LDLDIRECT 91.0 08/08/2015   TRIG 52.0 08/08/2015   CHOLHDL 3 08/08/2015   Lab Results  Component Value Date   ALT 44 08/08/2015   AST 35 08/08/2015   ALKPHOS 74 08/08/2015   BILITOT 1.1 08/08/2015

## 2015-08-17 NOTE — Assessment & Plan Note (Signed)
Smoking cessation instruction/counseling given:  commended patient for quitting and reviewed strategies for preventing relapses 

## 2015-11-04 ENCOUNTER — Ambulatory Visit

## 2015-11-04 ENCOUNTER — Ambulatory Visit (INDEPENDENT_AMBULATORY_CARE_PROVIDER_SITE_OTHER): Payer: Medicare Other

## 2015-11-04 DIAGNOSIS — Z23 Encounter for immunization: Secondary | ICD-10-CM | POA: Diagnosis not present

## 2015-11-04 NOTE — Progress Notes (Signed)
Patient came in for 3rd and final HepA/B immunization.  Received in Left deltoid.  Patient tolerated well.

## 2015-11-07 ENCOUNTER — Other Ambulatory Visit (INDEPENDENT_AMBULATORY_CARE_PROVIDER_SITE_OTHER): Payer: Medicare Other

## 2015-11-07 ENCOUNTER — Telehealth: Payer: Self-pay | Admitting: *Deleted

## 2015-11-07 DIAGNOSIS — E669 Obesity, unspecified: Secondary | ICD-10-CM

## 2015-11-07 DIAGNOSIS — E1169 Type 2 diabetes mellitus with other specified complication: Secondary | ICD-10-CM

## 2015-11-07 DIAGNOSIS — E119 Type 2 diabetes mellitus without complications: Secondary | ICD-10-CM | POA: Diagnosis not present

## 2015-11-07 DIAGNOSIS — E785 Hyperlipidemia, unspecified: Secondary | ICD-10-CM | POA: Diagnosis not present

## 2015-11-07 DIAGNOSIS — I10 Essential (primary) hypertension: Secondary | ICD-10-CM

## 2015-11-07 DIAGNOSIS — K760 Fatty (change of) liver, not elsewhere classified: Secondary | ICD-10-CM

## 2015-11-07 LAB — COMPREHENSIVE METABOLIC PANEL
ALT: 43 U/L (ref 0–53)
AST: 29 U/L (ref 0–37)
Albumin: 4.4 g/dL (ref 3.5–5.2)
Alkaline Phosphatase: 74 U/L (ref 39–117)
BILIRUBIN TOTAL: 0.9 mg/dL (ref 0.2–1.2)
BUN: 17 mg/dL (ref 6–23)
CO2: 25 mEq/L (ref 19–32)
CREATININE: 0.87 mg/dL (ref 0.40–1.50)
Calcium: 9.2 mg/dL (ref 8.4–10.5)
Chloride: 103 mEq/L (ref 96–112)
GFR: 113.27 mL/min (ref >60.00)
GLUCOSE: 137 mg/dL — AB (ref 70–99)
Potassium: 4.4 mEq/L (ref 3.5–5.1)
SODIUM: 138 meq/L (ref 135–145)
TOTAL PROTEIN: 7 g/dL (ref 6.0–8.3)

## 2015-11-07 LAB — HEMOGLOBIN A1C: Hgb A1c MFr Bld: 7.5 % — ABNORMAL HIGH (ref 4.6–6.5)

## 2015-11-07 LAB — LIPID PANEL
CHOL/HDL RATIO: 3
Cholesterol: 145 mg/dL (ref 0–200)
HDL: 47.1 mg/dL (ref >39.00)
LDL CALC: 85 mg/dL (ref 0–99)
NonHDL: 98.06
TRIGLYCERIDES: 67 mg/dL (ref 0.0–149.0)
VLDL: 13.4 mg/dL (ref 0.0–40.0)

## 2015-11-07 LAB — LDL CHOLESTEROL, DIRECT: Direct LDL: 81 mg/dL

## 2015-11-07 NOTE — Telephone Encounter (Signed)
Labs and dx?  

## 2015-11-09 ENCOUNTER — Encounter: Payer: Self-pay | Admitting: Internal Medicine

## 2015-11-14 ENCOUNTER — Encounter: Payer: Self-pay | Admitting: Internal Medicine

## 2015-11-14 ENCOUNTER — Ambulatory Visit (INDEPENDENT_AMBULATORY_CARE_PROVIDER_SITE_OTHER): Payer: Medicare Other | Admitting: Internal Medicine

## 2015-11-14 VITALS — BP 120/80 | HR 80 | Temp 98.7°F | Wt 199.0 lb

## 2015-11-14 DIAGNOSIS — E119 Type 2 diabetes mellitus without complications: Secondary | ICD-10-CM | POA: Diagnosis not present

## 2015-11-14 DIAGNOSIS — Z811 Family history of alcohol abuse and dependence: Secondary | ICD-10-CM

## 2015-11-14 DIAGNOSIS — R040 Epistaxis: Secondary | ICD-10-CM | POA: Insufficient documentation

## 2015-11-14 DIAGNOSIS — E669 Obesity, unspecified: Secondary | ICD-10-CM

## 2015-11-14 DIAGNOSIS — E785 Hyperlipidemia, unspecified: Secondary | ICD-10-CM

## 2015-11-14 DIAGNOSIS — Z6372 Alcoholism and drug addiction in family: Secondary | ICD-10-CM

## 2015-11-14 DIAGNOSIS — E1169 Type 2 diabetes mellitus with other specified complication: Secondary | ICD-10-CM

## 2015-11-14 MED ORDER — BACITRACIN-NEOMYCIN-POLYMYXIN 400-5-5000 EX OINT: TOPICAL_OINTMENT | CUTANEOUS | Status: DC

## 2015-11-14 NOTE — Progress Notes (Signed)
Subjective:  Patient ID: Robert Lat., male    DOB: 04/12/1951  Age: 65 y.o. MRN: [id number]  CC: The primary encounter diagnosis was Diabetes mellitus without complication ([place]). Diagnoses of Epistaxis, Obesity (BMI 30-39.9), Hyperlipidemia, Diabetes mellitus type 2 in obese ([place]), and Spouse of alcoholic were also pertinent to this visit.  HPI Robert Robertson. presents for 3 month follow up on diet controlled diabetes.  Patient has no complaints today.  Patient is following a low glycemic index diet and taking all prescribed medications regularly without side effects.  Fasting sugars have been under less than 130 most of the time and post prandials have been under 160 except on rare occasions., one episode of 250.    7 day average is 140 per machine.  30 day is slightly lower.     Patient has not been  Exercising regularly and has gained several pounds but states that he is  jJoining a gym next Monday with intention of going 5 days per week  To intentionally trying to lose weight .  Patient has had an eye exam in the last 12 months and checks feet regularly for signs of infection.  Patient does not walk barefoot outside,  And denies an numbness tingling or burning in feet. Patient is up to date on all recommended vaccinations  2) epistaxis :  He has had several minor spontaneous nose bleeds over the winter has prevented them by applying triple antibiotic ointment to his nares. He has non history of nasal sores or ulcers.  Requesting refill,    Lab Results  Component Value Date   HGBA1C 7.5* 11/07/2015   Lab Results  Component Value Date   MICROALBUR 0.7 08/08/2015    3) worried about wife Robert Robertson.  Per patient his wife has been abusing alcohol for years, by putting vodka in her coffee and he and his daughter have noticed a cognitive decline and loss of short term memory over the last 6 months. . He states that she consumes a  1/4 gallon of liquor per week.  He is  requesting help in getting her help. He has not confronted her yet.  He tried to make her an appointment with me but she cancelled it,    Outpatient Prescriptions Prior to Visit  Medication Sig Dispense Refill  . aspirin 81 MG tablet Take 81 mg by mouth daily.    [name] atorvastatin (LIPITOR) 80 MG tablet Take 1 tablet (80 mg total) by mouth daily. 90 tablet 3  . Cinnamon 500 MG capsule Take 1,000 mg by mouth daily.     [name] FREESTYLE LITE test strip CHECK SUGARS TWICE A DAY (Patient taking differently: CHECK SUGARS TWICE A) 200 each 1  . Krill Oil 300 MG CAPS Take 1 capsule by mouth daily.    . Lancets (FREESTYLE) lancets Check sugar bid 200 each 1  . lisinopril (PRINIVIL,ZESTRIL) 10 MG tablet TAKE 1 TABLET DAILY 90 tablet 3  . Multiple Vitamins-Minerals (CENTRUM SILVER ADULT 50+ PO) Take 1 tablet by mouth daily.     No facility-administered medications prior to visit.    Review of Systems;  Patient denies headache, fevers, malaise, unintentional weight loss, skin rash, eye pain, sinus congestion and sinus pain, sore throat, dysphagia,  hemoptysis , cough, dyspnea, wheezing, chest pain, palpitations, orthopnea, edema, abdominal pain, nausea, melena, diarrhea, constipation, flank pain, dysuria, hematuria, urinary  Frequency, nocturia, numbness, tingling, seizures,  Focal weakness, Loss of consciousness,  Tremor, insomnia, depression, anxiety,  and suicidal ideation.      Objective:  BP 120/80 mmHg  Pulse 80  Temp(Src) 98.7 F (37.1 C) (Oral)  Wt 199 lb (90.266 kg)  BP Readings from Last 3 Encounters:  11/14/15 120/80  08/15/15 130/80  05/07/15 140/80    Wt Readings from Last 3 Encounters:  11/14/15 199 lb (90.266 kg)  08/15/15 197 lb 4 oz (89.472 kg)  05/07/15 198 lb (89.812 kg)    General appearance: alert, cooperative and appears stated age Ears: normal TM's and external ear canals both ears Throat: lips, mucosa, and tongue normal; teeth and gums normal Neck: no adenopathy, no  carotid bruit, supple, symmetrical, trachea midline and thyroid not enlarged, symmetric, no tenderness/mass/nodules Back: symmetric, no curvature. ROM normal. No CVA tenderness. Lungs: clear to auscultation bilaterally Heart: regular rate and rhythm, S1, S2 normal, no murmur, click, rub or gallop Abdomen: soft, non-tender; bowel sounds normal; no masses,  no organomegaly Pulses: 2+ and symmetric Skin: Skin color, texture, turgor normal. No rashes or lesions Lymph nodes: Cervical, supraclavicular, and axillary nodes normal.  Lab Results  Component Value Date   HGBA1C 7.5* 11/07/2015   HGBA1C 7.5* 08/08/2015   HGBA1C 7.1* 05/07/2015    Lab Results  Component Value Date   CREATININE 0.87 11/07/2015   CREATININE 0.87 08/08/2015   CREATININE 0.96 05/07/2015    Lab Results  Component Value Date   WBC 6.1 01/11/2013   HGB 15.0 01/11/2013   HCT 48.0 01/11/2013   PLT 154 02/28/2012   GLUCOSE 137* 11/07/2015   CHOL 145 11/07/2015   TRIG 67.0 11/07/2015   HDL 47.10 11/07/2015   LDLDIRECT 81.0 11/07/2015   LDLCALC 85 11/07/2015   ALT 43 11/07/2015   AST 29 11/07/2015   NA 138 11/07/2015   K 4.4 11/07/2015   CL 103 11/07/2015   CREATININE 0.87 11/07/2015   BUN 17 11/07/2015   CO2 25 11/07/2015   PSA 2.00 05/14/2014   INR 0.99 02/29/2012   HGBA1C 7.5* 11/07/2015   MICROALBUR 0.7 08/08/2015    US Abdomen Limited Ruq  03/10/2015  CLINICAL DATA:  Elevated liver enzymes EXAM: US ABDOMEN LIMITED - RIGHT UPPER QUADRANT COMPARISON:  None. FINDINGS: Gallbladder: No gallstones or wall thickening visualized. There is no pericholecystic fluid. No sonographic Murphy sign noted. Common bile duct: Diameter: 4 mm. There is no intrahepatic or extrahepatic biliary duct dilatation. Liver: No focal lesion identified. Liver echogenicity is diffusely increased. IMPRESSION: Liver echogenicity is diffusely increased. This finding is most likely indicative of hepatic steatosis. While no focal liver  lesions are identified, it must be cautioned that sensitive ultrasound for focal liver lesions is diminished in this circumstance. Study otherwise unremarkable. Electronically Signed   By: Lowella Grip III M.D.   On: 03/10/2015 08:34    Assessment & Plan:   Problem List Items Addressed This Visit    Diabetes mellitus type 2 in obese ([place])    Continued  loss of control on diet alone,  hemoglobin A1c has remained  7.5. Patient is [facility] to [facility] for  an annual eye exam and foot exam is unchanged today. Patient has no microalbuminuria. Patient is tolerating statin therapy for CAD risk reduction and on ACE/ARB for renal protection and hypertension . Dietary changes dicussed and need for regular exercise .  He is wiling to start medication in  3 months if he has not been ablt to lower a1c < 7.0 by next visit. .  Lab Results  Component Value Date  HGBA1C 7.5* 11/07/2015   Lab Results  Component Value Date   MICROALBUR 0.7 08/08/2015   Lab Results  Component Value Date   CHOL 145 11/07/2015   HDL 47.10 11/07/2015   LDLCALC 85 11/07/2015   LDLDIRECT 81.0 11/07/2015   TRIG 67.0 11/07/2015   CHOLHDL 3 11/07/2015               Hyperlipidemia    LDL and triglycerides are at goal on current medications. He has no side effects and liver enzymes are normal. No changes today  Lab Results  Component Value Date   CHOL 145 11/07/2015   HDL 47.10 11/07/2015   LDLCALC 85 11/07/2015   LDLDIRECT 81.0 11/07/2015   TRIG 67.0 11/07/2015   CHOLHDL 3 11/07/2015   Lab Results  Component Value Date   ALT 43 11/07/2015   AST 29 11/07/2015   ALKPHOS 74 11/07/2015   BILITOT 0.9 11/07/2015           Obesity (BMI 30-39.9)    I have addressed  BMI and recommended a low glycemic index diet utilizing smaller more frequent meals to increase metabolism.  I have also recommended that patient start exercising with a goal of 30 minutes of aerobic exercise a minimum of 5 days per week.        Epistaxis    He has no ulcers or crusting to suggest MRSA.  Trial of vaseline to moisturize nares.       Spouse of alcoholic    Need for family/intervention discussed and encouraged  As first step, along with regular attendance to [employer].         Other Visit Diagnoses    Diabetes mellitus without complication ([place])    -  Primary    Relevant Orders    Ambulatory referral to Ophthalmology      A total of 25 minutes of face to face time was spent with patient more than half of which was spent in counselling and coordination of care    I am having Mr. Filak start on neomycin-bacitracin-polymyxin. I am also having him maintain his aspirin, Krill Oil, freestyle, Multiple Vitamins-Minerals (CENTRUM SILVER ADULT 50+ PO), FREESTYLE LITE, Cinnamon, atorvastatin, and lisinopril.  Meds ordered this encounter  Medications  . neomycin-bacitracin-polymyxin (NEOSPORIN) ointment    Sig: apply to inside of nose twice daily as needed    Dispense:  15 g    Refill:  0    There are no discontinued medications.  Follow-up: Return in about 3 months (around 02/14/2016) for follow up diabetes.   [name], MD

## 2015-11-14 NOTE — Patient Instructions (Addendum)
Your diabetes cotrol is about the same as last time not quite at goal  We wil give exercise 3 months to make a difference, and if a1c is still > 7.0, we'll discuss medication next visit   You can try using Vaseline to line the  inside of your nose and prevent nosebleeds.  I have given you a prescription for the antibiotic ointment in case this doesn't work.    For your allergies ,  You can use Benadryl but you should also consider using  one of these newer second generation antihistamines that are longer acting, non sedating and  available OTC:  Generic  Zyrtec, which is cetirizine.    generic Allegra , available generically as fexofenadine ; comes in 60 mg and 180 mg once daily strengths.    Generic Claritin :  also available as loratidine .

## 2015-11-14 NOTE — Progress Notes (Signed)
Pre visit review using our clinic review tool, if applicable. No additional management support is needed unless otherwise documented below in the visit note. 

## 2015-11-15 DIAGNOSIS — Z6372 Alcoholism and drug addiction in family: Secondary | ICD-10-CM | POA: Insufficient documentation

## 2015-11-15 NOTE — Assessment & Plan Note (Signed)
Need for family/intervention discussed and encouraged  As first step, along with regular attendance to [employer].

## 2015-11-15 NOTE — Assessment & Plan Note (Signed)
I have addressed  BMI and recommended a low glycemic index diet utilizing smaller more frequent meals to increase metabolism.  I have also recommended that patient start exercising with a goal of 30 minutes of aerobic exercise a minimum of 5 days per week.  

## 2015-11-15 NOTE — Assessment & Plan Note (Signed)
He has no ulcers or crusting to suggest MRSA.  Trial of vaseline to moisturize nares.

## 2015-11-15 NOTE — Assessment & Plan Note (Addendum)
Continued  loss of control on diet alone,  hemoglobin A1c has remained  7.5. Patient is referred to [facility] for  an annual eye exam and foot exam is unchanged today. Patient has no microalbuminuria. Patient is tolerating statin therapy for CAD risk reduction and on ACE/ARB for renal protection and hypertension . Dietary changes dicussed and need for regular exercise .  He is wiling to start medication in  3 months if he has not been ablt to lower a1c < 7.0 by next visit. .  Lab Results  Component Value Date   HGBA1C 7.5* 11/07/2015   Lab Results  Component Value Date   MICROALBUR 0.7 08/08/2015   Lab Results  Component Value Date   CHOL 145 11/07/2015   HDL 47.10 11/07/2015   LDLCALC 85 11/07/2015   LDLDIRECT 81.0 11/07/2015   TRIG 67.0 11/07/2015   CHOLHDL 3 11/07/2015

## 2015-11-15 NOTE — Assessment & Plan Note (Signed)
LDL and triglycerides are at goal on current medications. He has no side effects and liver enzymes are normal. No changes today  Lab Results  Component Value Date   CHOL 145 11/07/2015   HDL 47.10 11/07/2015   LDLCALC 85 11/07/2015   LDLDIRECT 81.0 11/07/2015   TRIG 67.0 11/07/2015   CHOLHDL 3 11/07/2015   Lab Results  Component Value Date   ALT 43 11/07/2015   AST 29 11/07/2015   ALKPHOS 74 11/07/2015   BILITOT 0.9 11/07/2015

## 2015-11-26 DIAGNOSIS — K760 Fatty (change of) liver, not elsewhere classified: Secondary | ICD-10-CM | POA: Diagnosis not present

## 2015-11-28 ENCOUNTER — Other Ambulatory Visit: Payer: Self-pay | Admitting: Nurse Practitioner

## 2015-11-28 DIAGNOSIS — K760 Fatty (change of) liver, not elsewhere classified: Secondary | ICD-10-CM

## 2015-12-05 ENCOUNTER — Ambulatory Visit
Admission: RE | Admit: 2015-12-05 | Discharge: 2015-12-05 | Disposition: A | Discharge status: Home / Self Care | Payer: Medicare Other | Source: Ambulatory Visit | Attending: Nurse Practitioner | Admitting: Nurse Practitioner

## 2015-12-05 DIAGNOSIS — K760 Fatty (change of) liver, not elsewhere classified: Secondary | ICD-10-CM | POA: Diagnosis not present

## 2015-12-05 IMAGING — US US ABDOMEN LIMITED
1 series · 14 of 25 positions shown · non-contrast
Comparison: Ultrasound of the liver [DATE]

CLINICAL DATA: Follow-up hepatic steatosis. No pain or other
symptoms

EXAM:
US ABDOMEN LIMITED - RIGHT UPPER QUADRANT

[Series 1: us abdomen limited · 0.22mm/px · 14 of 51 slices shown]
[im 1/51]
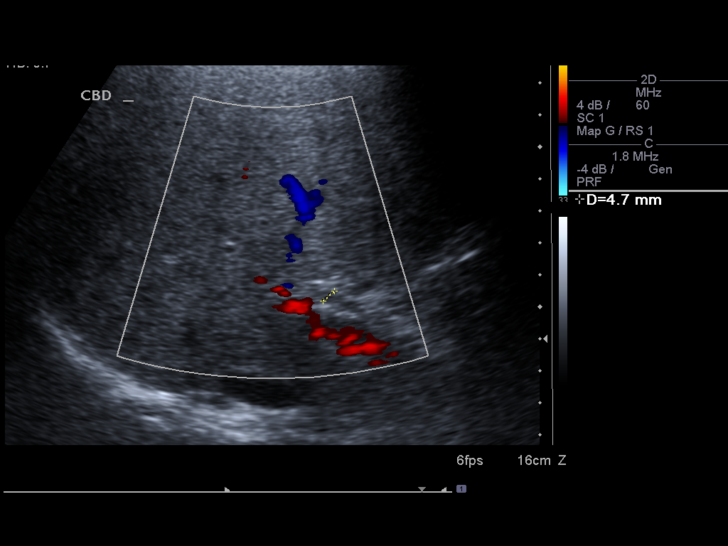
[im 5/51]
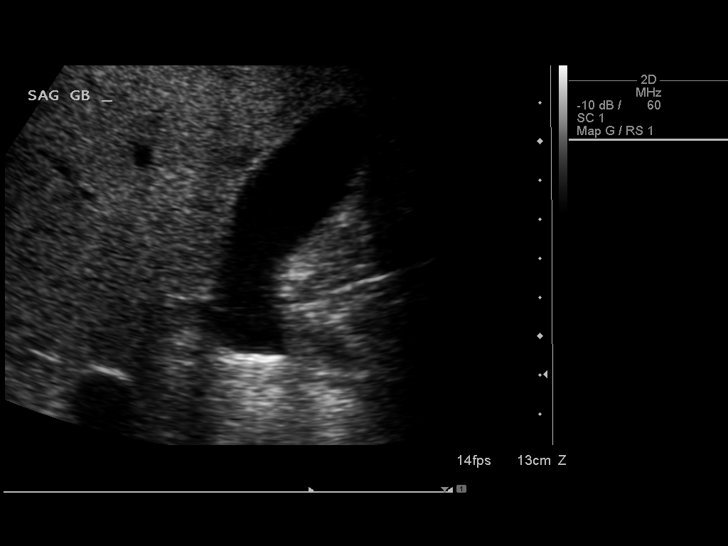
[im 9/51]
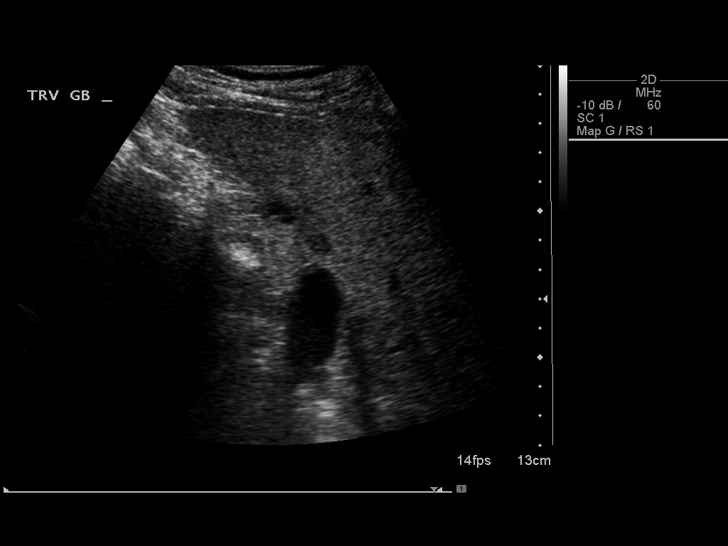
[im 13/51]
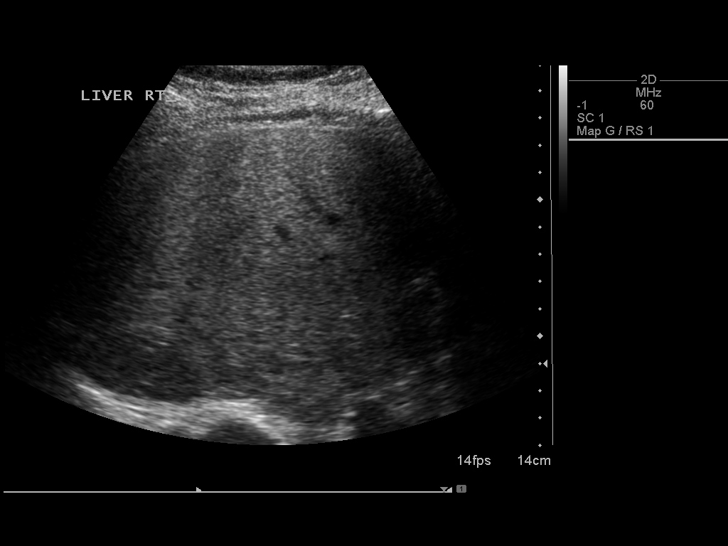
[im 17/51]
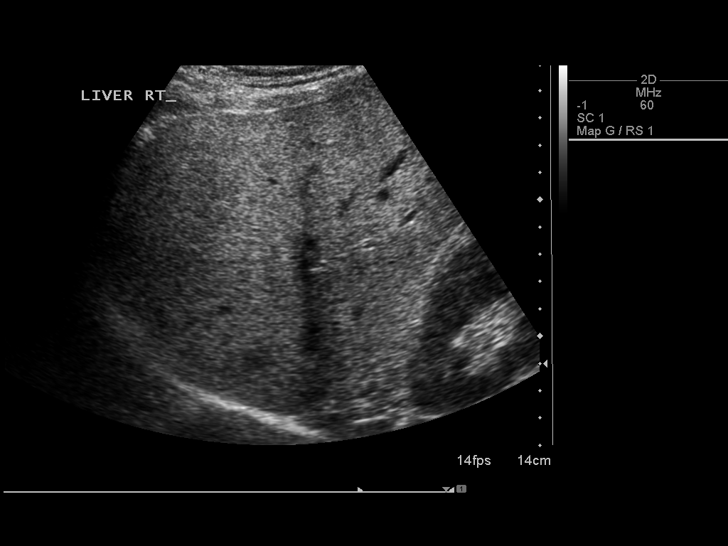
[im 19/51]
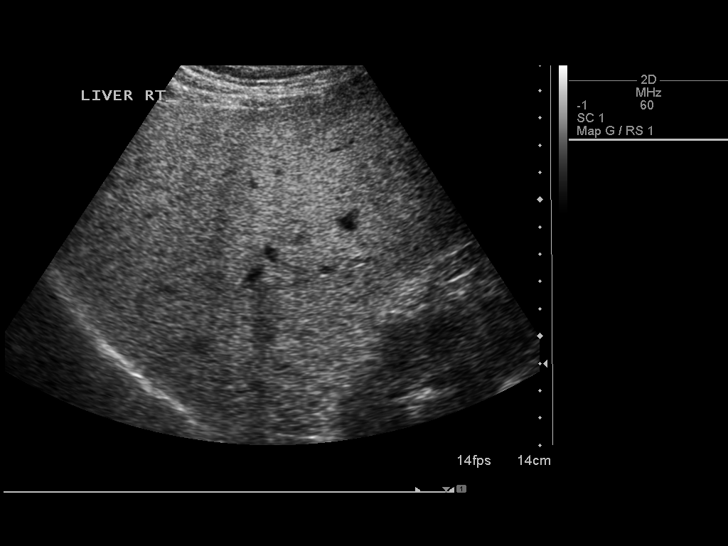
[im 23/51]
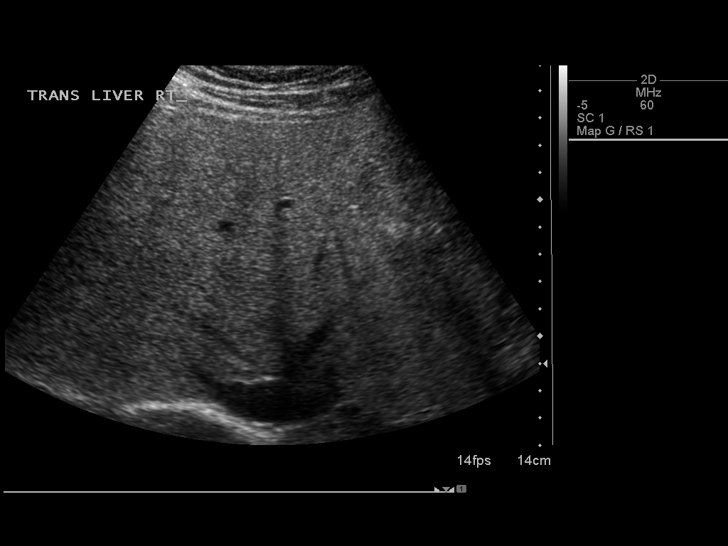
[im 28/51]
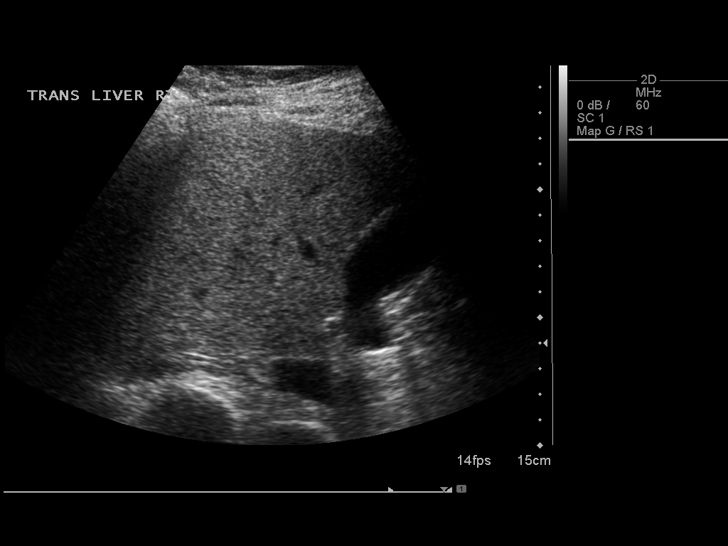
[im 32/51]
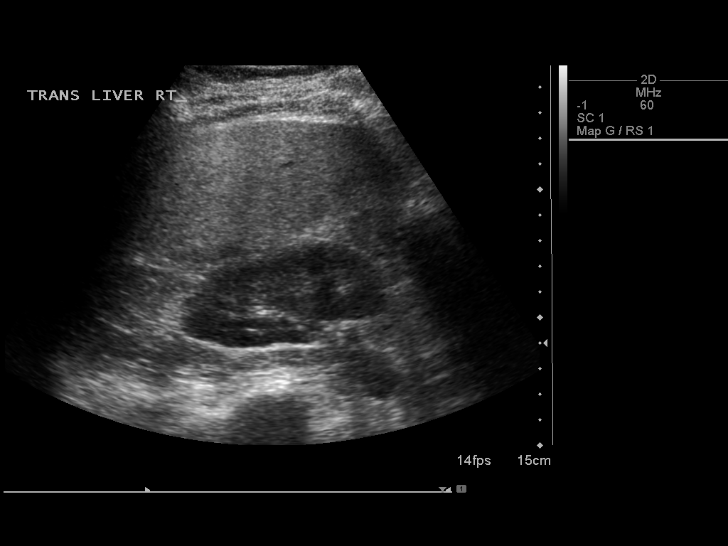
[im 34/51]
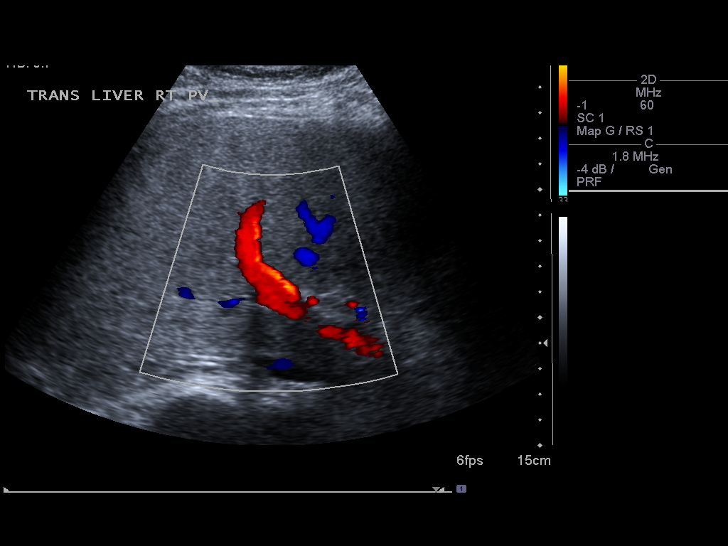
[im 38/51]
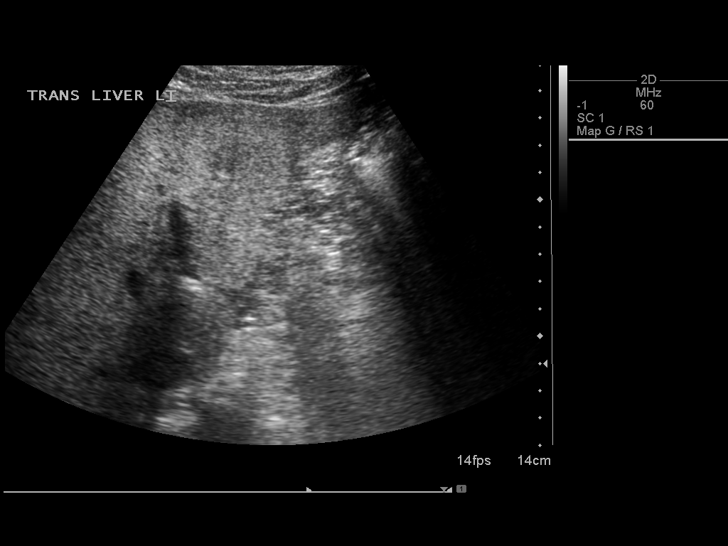
[im 42/51]
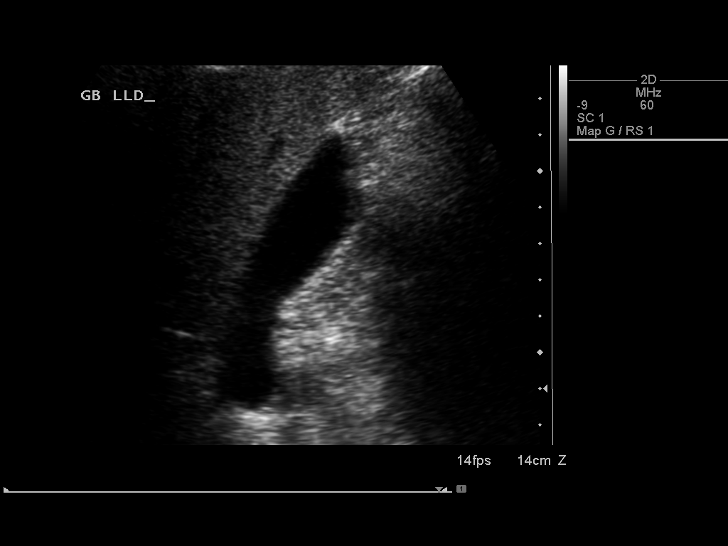
[im 46/51]
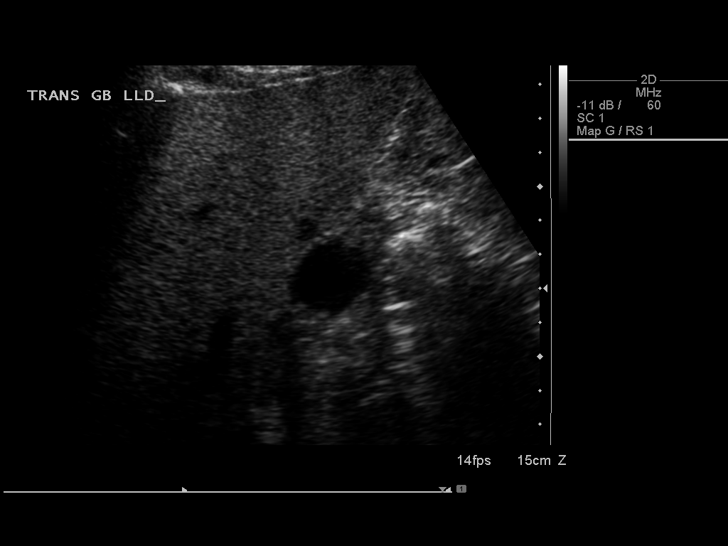
[im 51/51]
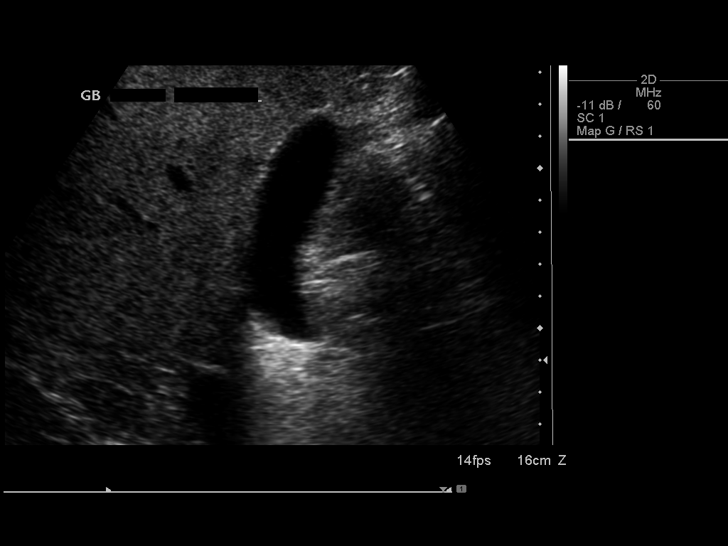

[14 of 25 positions shown; findings below may reference images not displayed]

FINDINGS: Gallbladder:

No gallstones or wall thickening visualized. No sonographic Murphy
sign noted by sonographer.

Common bile duct:

Diameter: 4.7 mm

Liver:

The hepatic echotexture is heterogeneously increased. There is no
focal mass nor ductal dilation. The surface contour of the liver is
normal.
IMPRESSION: Heterogeneously increased hepatic echotexture likely reflecting
fatty infiltrative change. No gallstones or other acute
hepatobiliary abnormality.

## 2015-12-12 ENCOUNTER — Ambulatory Visit: Payer: Medicare Other

## 2015-12-12 ENCOUNTER — Ambulatory Visit (INDEPENDENT_AMBULATORY_CARE_PROVIDER_SITE_OTHER): Payer: Medicare Other | Admitting: Urgent Care

## 2015-12-12 ENCOUNTER — Ambulatory Visit (INDEPENDENT_AMBULATORY_CARE_PROVIDER_SITE_OTHER): Payer: Medicare Other

## 2015-12-12 VITALS — BP 132/82 | HR 96 | Temp 99.6°F | Resp 17 | Ht 68.5 in | Wt 196.0 lb

## 2015-12-12 DIAGNOSIS — M25569 Pain in unspecified knee: Secondary | ICD-10-CM | POA: Diagnosis not present

## 2015-12-12 DIAGNOSIS — M7989 Other specified soft tissue disorders: Secondary | ICD-10-CM

## 2015-12-12 DIAGNOSIS — M79644 Pain in right finger(s): Secondary | ICD-10-CM | POA: Diagnosis not present

## 2015-12-12 DIAGNOSIS — M25561 Pain in right knee: Secondary | ICD-10-CM

## 2015-12-12 DIAGNOSIS — M79641 Pain in right hand: Secondary | ICD-10-CM | POA: Diagnosis not present

## 2015-12-12 DIAGNOSIS — M79609 Pain in unspecified limb: Secondary | ICD-10-CM | POA: Diagnosis not present

## 2015-12-12 DIAGNOSIS — L539 Erythematous condition, unspecified: Secondary | ICD-10-CM | POA: Diagnosis not present

## 2015-12-12 LAB — POCT CBC
Granulocyte percent: 70.5 %G (ref 37–80)
HEMATOCRIT: 41.7 % — AB (ref 43.5–53.7)
Hemoglobin: 14.6 g/dL (ref 14.1–18.1)
LYMPH, POC: 1.4 (ref 0.6–3.4)
MCH, POC: 28.8 pg (ref 27–31.2)
MCHC: 35 g/dL (ref 31.8–35.4)
MCV: 82.4 fL (ref 80–97)
MID (cbc): 0.4 (ref 0–0.9)
MPV: 8.4 fL (ref 0–99.8)
POC GRANULOCYTE: 4.4 (ref 2–6.9)
POC LYMPH PERCENT: 22.7 %L (ref 10–50)
POC MID %: 6.8 %M (ref 0–12)
Platelet Count, POC: 118 10*3/uL — AB (ref 142–424)
RBC: 5.05 M/uL (ref 4.69–6.13)
RDW, POC: 13.4 %
WBC: 6.2 10*3/uL (ref 4.6–10.2)

## 2015-12-12 IMAGING — DX DG HAND COMPLETE 3+V*R*
3 series · 3 of 3 positions shown · non-contrast
Comparison: None.

CLINICAL DATA: Patient with right hand and pointer finger pain for
3 days. No known injury. Initial encounter.

EXAM:
RIGHT HAND - COMPLETE 3+ VIEW

[hand pa]
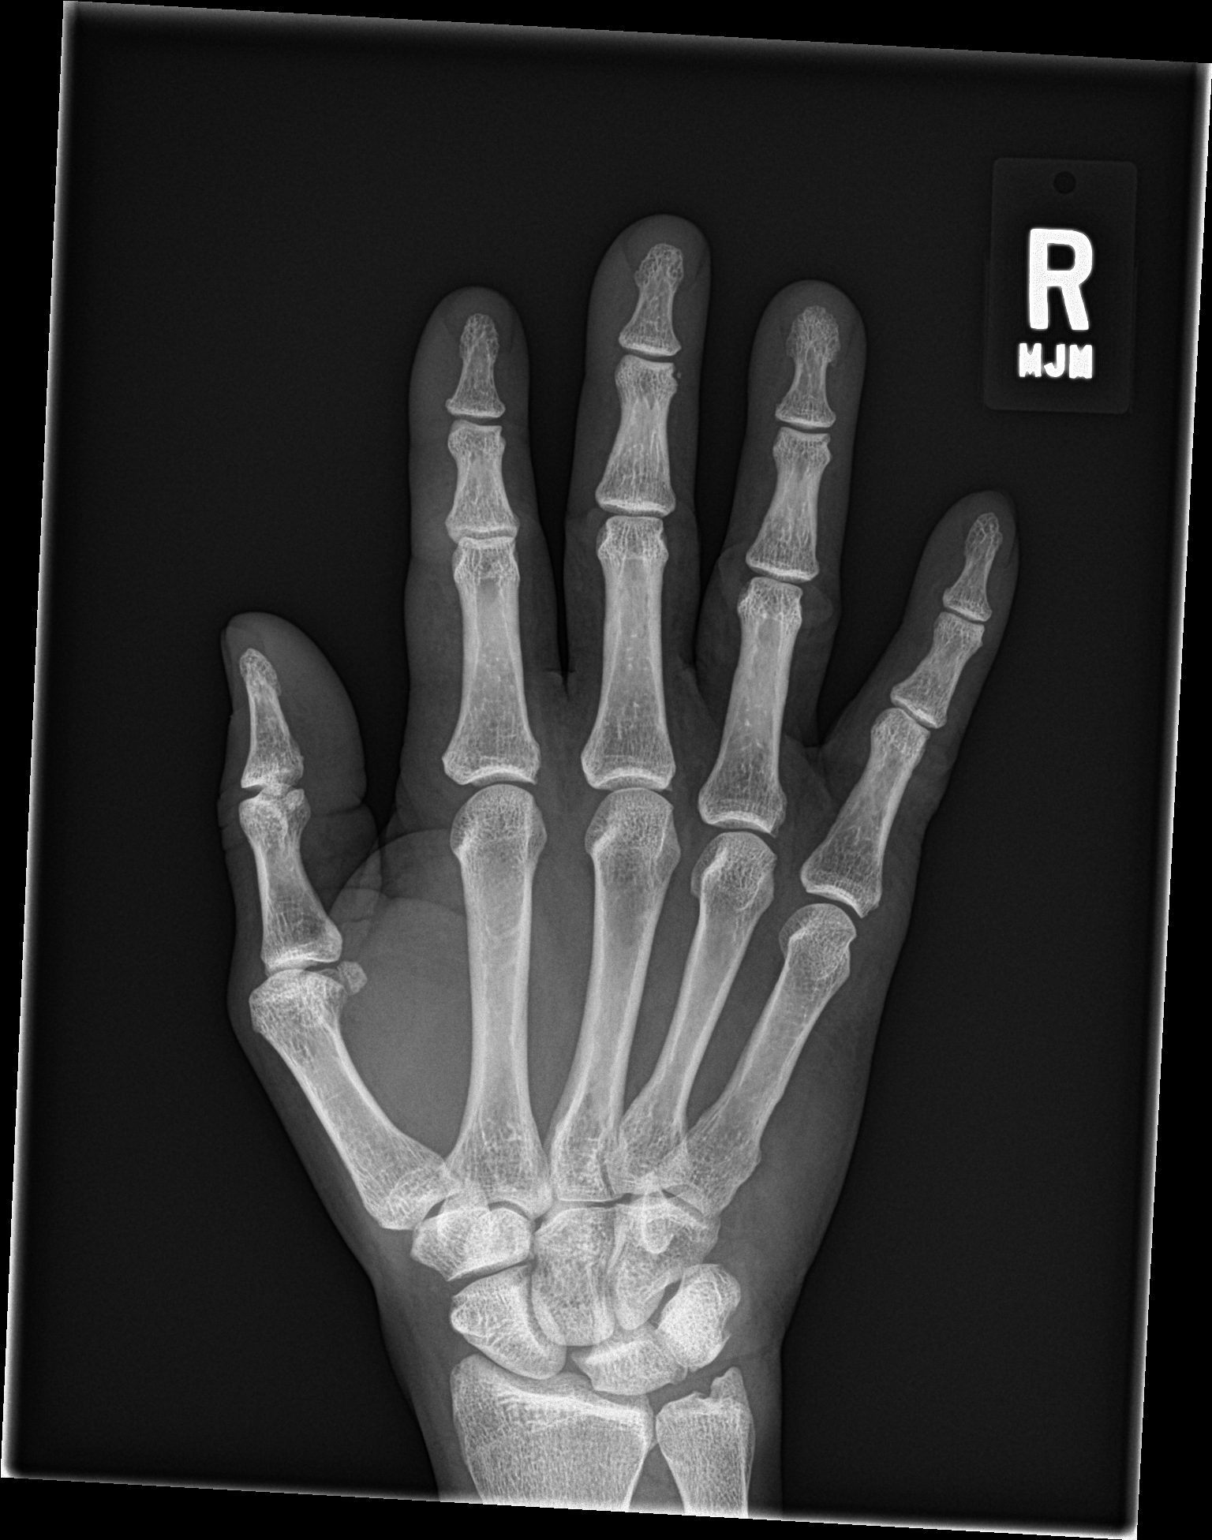

[hand obl]
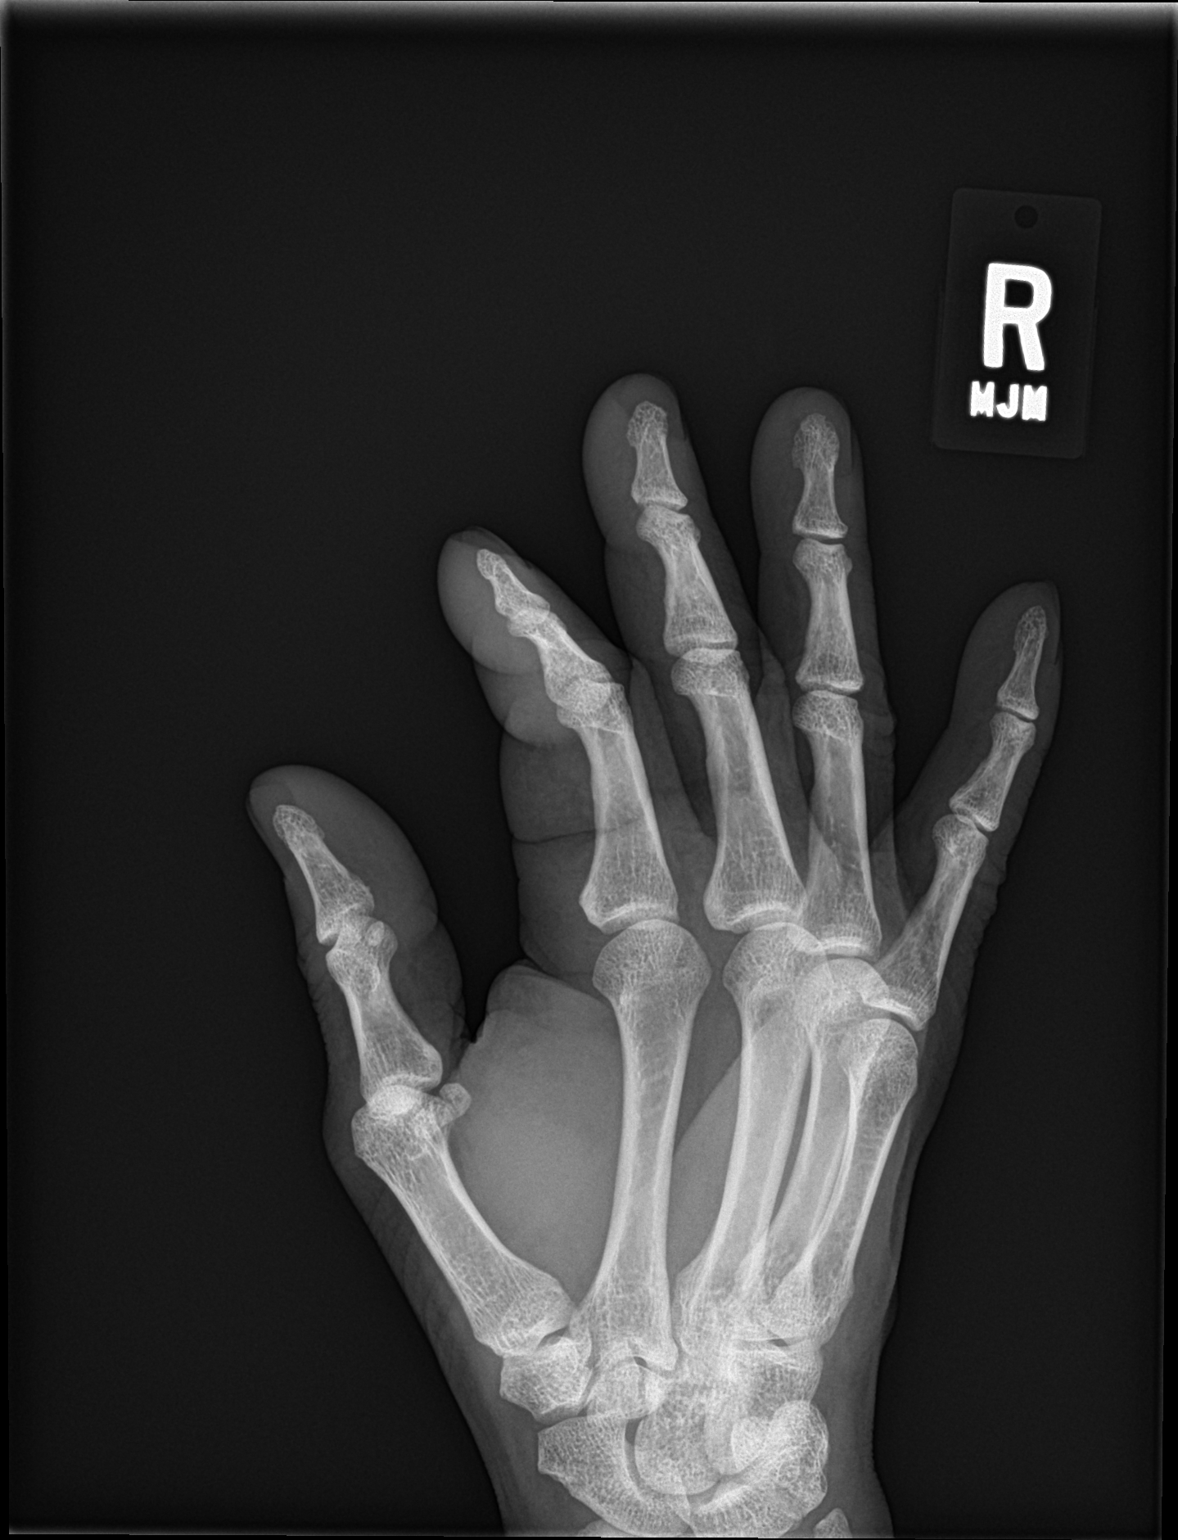

[hand lat]
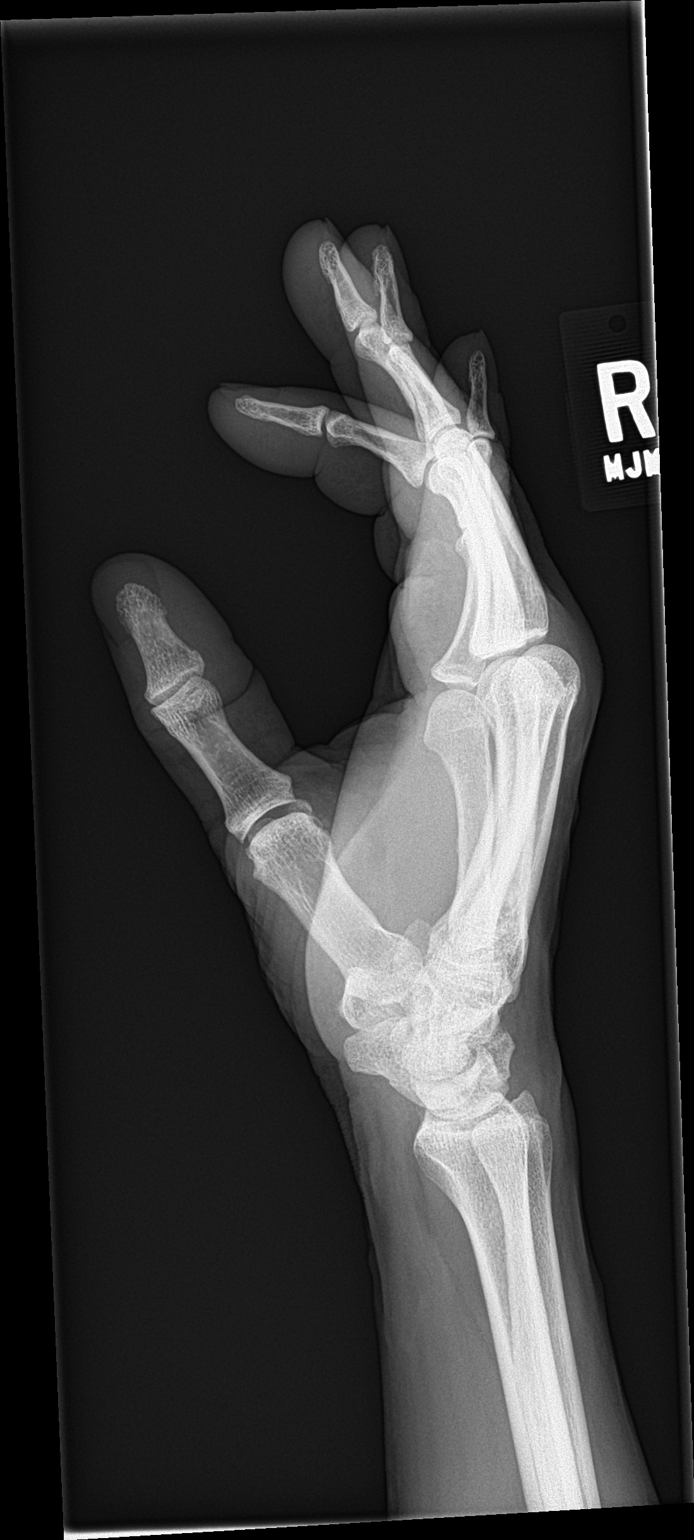

[3 of 3 positions shown; findings below may reference images not displayed]

FINDINGS: There is no evidence of fracture or dislocation. There is no
evidence of arthropathy or other focal bone abnormality. Soft
tissues are unremarkable.
IMPRESSION: Negative.

## 2015-12-12 IMAGING — DX DG KNEE COMPLETE 4+V*R*
4 series · 4 of 4 positions shown · non-contrast
Comparison: None.

CLINICAL DATA: Patient with lateral right knee pain for 3 days.

EXAM:
RIGHT KNEE - COMPLETE 4+ VIEW

[knee ap]
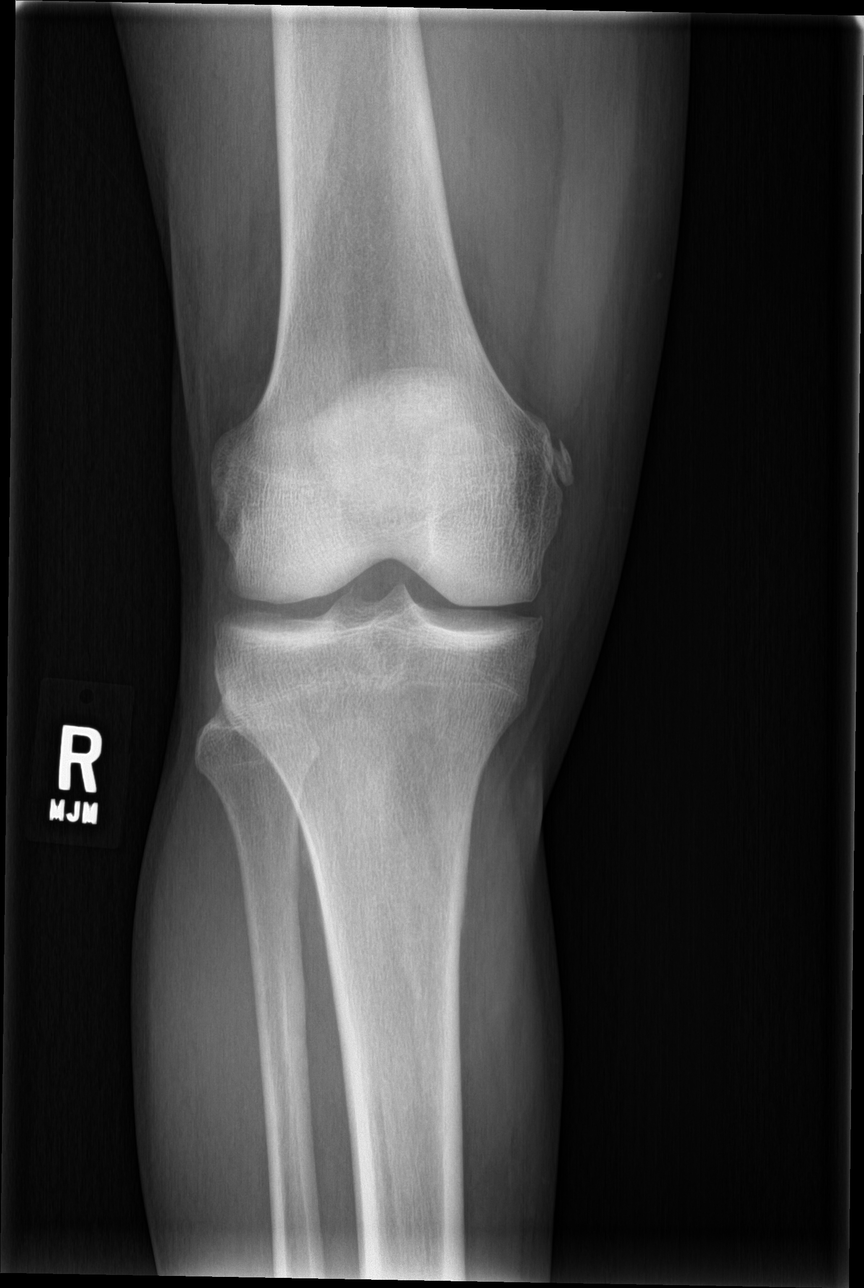

[knee obl (1 of 2)]
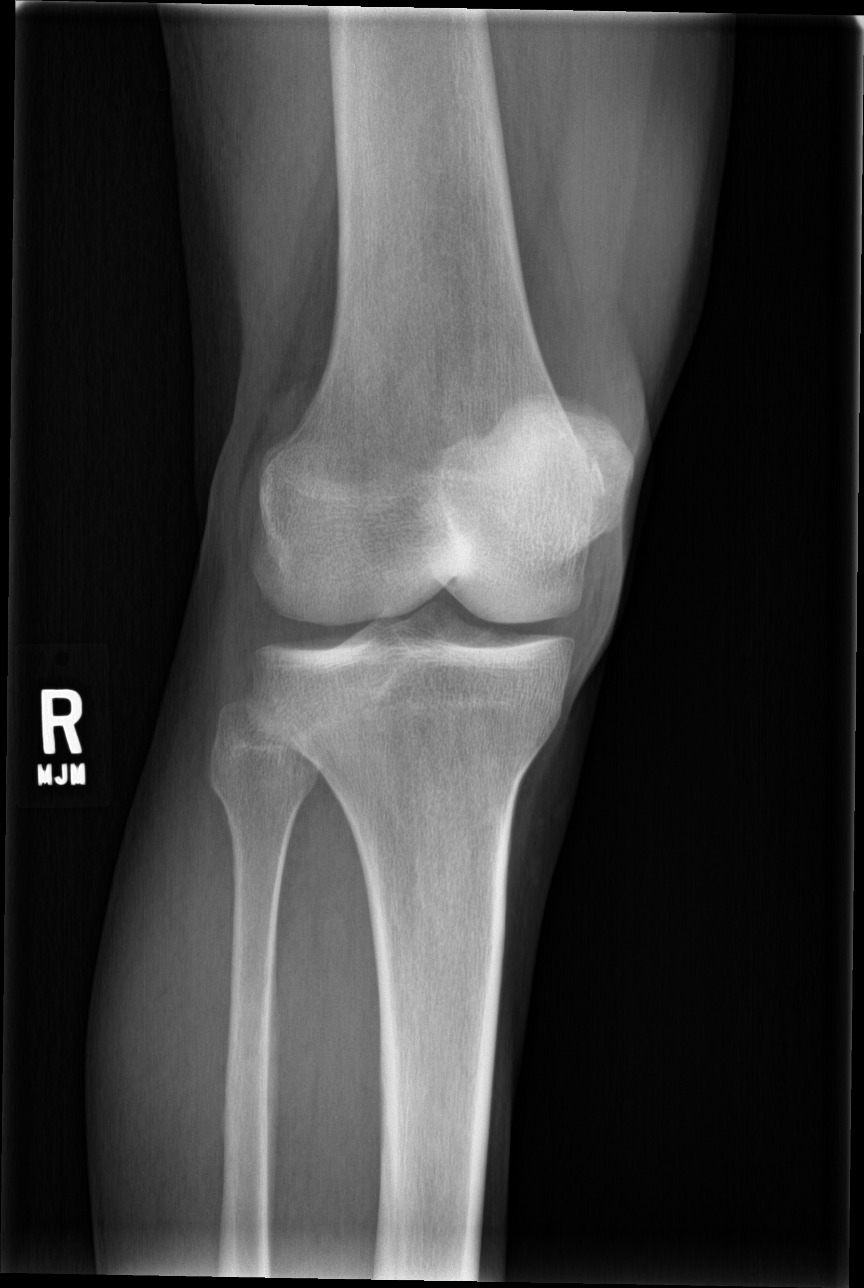

[knee obl (2 of 2)]
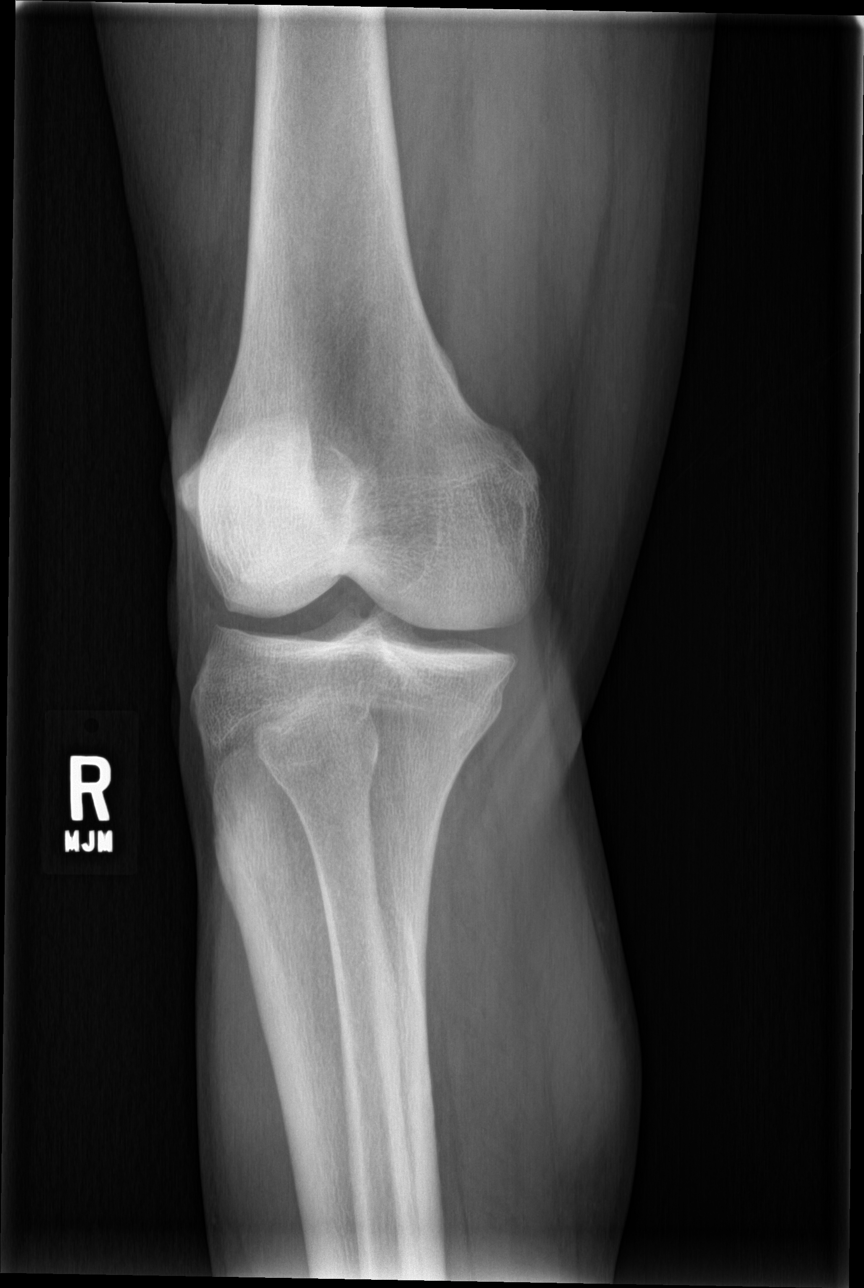

[knee lat]
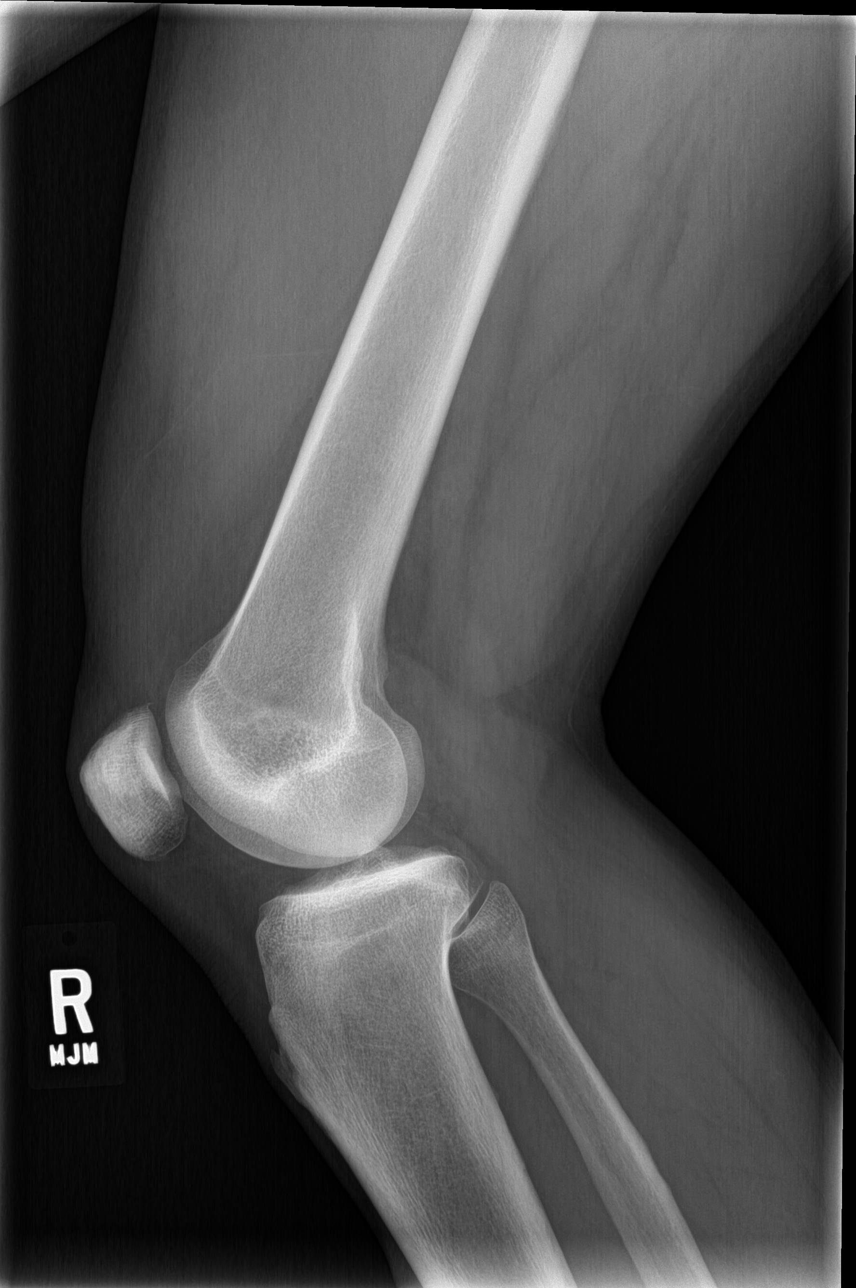

[4 of 4 positions shown; findings below may reference images not displayed]

FINDINGS: Normal anatomic alignment. No evidence for acute fracture or
dislocation. No joint effusion. Ossification near the medial femoral
collateral ligament, adjacent to the medial femoral condyle.
IMPRESSION: No acute osseous abnormality.

Pellegrini-Stieda lesion, potentially sequelae of prior avulsion
injury.

## 2015-12-12 MED ORDER — PREDNISONE 20 MG PO TABS: ORAL_TABLET | ORAL | Status: DC

## 2015-12-12 MED ORDER — PREDNISONE 10 MG PO TABS: ORAL_TABLET | ORAL | Status: DC

## 2015-12-12 NOTE — Progress Notes (Signed)
MRN: [id number] DOB: Apr 02, 1951  Subjective:   Robert Robertson. is a 65 y.o. male presenting for chief complaint of Knee Pain  Reports 2 day history of right knee pain, right hand swelling, redness and pain. Also admits 2 week history of rash over his forearms from coming into contact with poison ivy. This resolved with otc creams. Denies fever, cough, chest pain, trauma to knee or hand, swelling or redness of knee, headache, red eyes, n/v, abdominal pain. Denies history of gout.   Margarita has a current medication list which includes the following prescription(s): aspirin, atorvastatin, cinnamon, freestyle lite, krill oil, freestyle, lisinopril, and multiple vitamins-minerals. Also is allergic to pollen extract.  Caroline  has a past medical history of Hypertension; Diabetes mellitus; Allergy; Hyperlipidemia; and Colon polyp. Also  has past surgical history that includes Colonoscopy (2010).  His family history includes Cancer in his sister; Diabetes in his brother, maternal grandfather, maternal grandmother, and mother; Stroke in his mother.   Objective:   Vitals: BP 132/82 mmHg  Pulse 111  Temp(Src) 101.9 F (38.8 C) (Oral)  Resp 17  Ht 5' 8.5" (1.74 m)  Wt 196 lb (88.905 kg)  BMI 29.36 kg/m2  SpO2 95%  Temp was 100.27F on recheck by PA-Sourish Allender.  Physical Exam  Constitutional: He is oriented to person, place, and time. He appears well-developed and well-nourished.  HENT:  Mouth/Throat: Oropharynx is clear and moist.  Eyes: Right eye exhibits no discharge. Left eye exhibits no discharge. No scleral icterus.  Neck: Normal range of motion. Neck supple.  Cardiovascular: Normal rate, regular rhythm and intact distal pulses.  Exam reveals no gallop and no friction rub.   No murmur heard. Pulmonary/Chest: No respiratory distress. He has no wheezes. He has no rales.  Musculoskeletal:       Right knee: He exhibits normal range of motion, no swelling, no effusion, no ecchymosis, no  deformity, no laceration, no erythema and normal patellar mobility. No tenderness found.       Right hand: He exhibits decreased range of motion (flexion, extension of right index finger), tenderness (right index finger as well as DIP, PIP and MCP of index finger) and swelling (over area outlined with associated erythema). He exhibits normal two-point discrimination, normal capillary refill and no deformity. Normal sensation noted. Normal strength noted.       Hands: Lymphadenopathy:    He has no cervical adenopathy.  Neurological: He is alert and oriented to person, place, and time.  Skin: Skin is warm and dry. Rash (multiple erythematous exoriations in liner distribution over his forearms bilaterally; also has ~0.5cm macular lesions over his torso and back) noted.   Results for orders placed or performed in visit on 12/12/15 (from the past 24 hour(s))  POCT CBC     Status: Abnormal   Collection Time: 12/12/15  6:26 PM  Result Value Ref Range   WBC 6.2 4.6 - 10.2 K/uL   Lymph, poc 1.4 0.6 - 3.4   POC LYMPH PERCENT 22.7 10 - 50 %L   MID (cbc) 0.4 0 - 0.9   POC MID % 6.8 0 - 12 %M   POC Granulocyte 4.4 2 - 6.9   Granulocyte percent 70.5 37 - 80 %G   RBC 5.05 4.69 - 6.13 M/uL   Hemoglobin 14.6 14.1 - 18.1 g/dL   HCT, POC 41.7 (A) 43.5 - 53.7 %   MCV 82.4 80 - 97 fL   MCH, POC 28.8 27 - 31.2 pg  MCHC 35.0 31.8 - 35.4 g/dL   RDW, POC 13.4 %   Platelet Count, POC 118 (A) 142 - 424 K/uL   MPV 8.4 0 - 99.8 fL   Dg Knee Complete 4 Views Right  12/12/2015  CLINICAL DATA:  Patient with lateral right knee pain for 3 days. EXAM: RIGHT KNEE - COMPLETE 4+ VIEW COMPARISON:  None. FINDINGS: Normal anatomic alignment. No evidence for acute fracture or dislocation. No joint effusion. Ossification near the medial femoral collateral ligament, adjacent to the medial femoral condyle. IMPRESSION: No acute osseous abnormality. Pellegrini-Stieda lesion, potentially sequelae of prior avulsion injury.  Electronically Signed   By: Lovey Newcomer M.D.   On: 12/12/2015 18:48   Dg Hand Complete Right  12/12/2015  CLINICAL DATA:  Patient with right hand and pointer finger pain for 3 days. No known injury. Initial encounter. EXAM: RIGHT HAND - COMPLETE 3+ VIEW COMPARISON:  None. FINDINGS: There is no evidence of fracture or dislocation. There is no evidence of arthropathy or other focal bone abnormality. Soft tissues are unremarkable. IMPRESSION: Negative. Electronically Signed   By: Lovey Newcomer M.D.   On: 12/12/2015 18:44   Assessment and Plan :   1. Finger pain, right 2. Swelling of right hand 3. Erythema of hand 4. Right knee pain - Will manage as inflammatory process possibly related to poison ivy contact. Patient is to use short steroid course. He will f/u in clinic on 12/15/2015.  Jaynee Eagles, PA-C [facility] [phone] 12/12/2015 6:05 PM

## 2015-12-12 NOTE — Patient Instructions (Signed)
Days 1-5: Take 2 tablets (40mg ) daily with breakfast. Days 6-10: Take 1 tablet (20mg ) daily with breakfast. Days 11-15: Take 1 tablet (10mg ) daily with breakfast.

## 2015-12-13 LAB — COMPREHENSIVE METABOLIC PANEL
ALK PHOS: 79 U/L (ref 40–115)
ALT: 62 U/L — AB (ref 9–46)
AST: 45 U/L — AB (ref 10–35)
Albumin: 4.3 g/dL (ref 3.6–5.1)
BILIRUBIN TOTAL: 1.1 mg/dL (ref 0.2–1.2)
BUN: 16 mg/dL (ref 7–25)
CALCIUM: 9.7 mg/dL (ref 8.6–10.3)
CO2: 21 mmol/L (ref 20–31)
Chloride: 103 mmol/L (ref 98–110)
Creat: 1.1 mg/dL (ref 0.70–1.25)
GLUCOSE: 129 mg/dL — AB (ref 65–99)
Potassium: 4.2 mmol/L (ref 3.5–5.3)
Sodium: 137 mmol/L (ref 135–146)
TOTAL PROTEIN: 7.3 g/dL (ref 6.1–8.1)

## 2015-12-13 LAB — URIC ACID: URIC ACID, SERUM: 5.7 mg/dL (ref 4.0–8.0)

## 2015-12-15 ENCOUNTER — Ambulatory Visit (INDEPENDENT_AMBULATORY_CARE_PROVIDER_SITE_OTHER): Payer: Medicare Other | Admitting: Urgent Care

## 2015-12-15 VITALS — BP 116/70 | HR 89 | Temp 99.0°F | Resp 18 | Ht 68.5 in | Wt 196.0 lb

## 2015-12-15 DIAGNOSIS — M7989 Other specified soft tissue disorders: Secondary | ICD-10-CM | POA: Diagnosis not present

## 2015-12-15 DIAGNOSIS — E119 Type 2 diabetes mellitus without complications: Secondary | ICD-10-CM | POA: Diagnosis not present

## 2015-12-15 DIAGNOSIS — K760 Fatty (change of) liver, not elsewhere classified: Secondary | ICD-10-CM | POA: Diagnosis not present

## 2015-12-15 DIAGNOSIS — L539 Erythematous condition, unspecified: Secondary | ICD-10-CM | POA: Diagnosis not present

## 2015-12-15 DIAGNOSIS — I10 Essential (primary) hypertension: Secondary | ICD-10-CM | POA: Diagnosis not present

## 2015-12-15 DIAGNOSIS — L255 Unspecified contact dermatitis due to plants, except food: Secondary | ICD-10-CM | POA: Diagnosis not present

## 2015-12-15 NOTE — Patient Instructions (Addendum)
Poison [employer] ivy is a inflammation of the skin (contact dermatitis) caused by touching the allergens on the leaves of the ivy plant following previous exposure to the plant. The rash usually appears 48 hours after exposure. The rash is usually bumps (papules) or blisters (vesicles) in a linear pattern. Depending on your own sensitivity, the rash may simply cause redness and itching, or it may also progress to blisters which may break open. These must be well cared for to prevent secondary bacterial (germ) infection, followed by scarring. Keep any open areas dry, clean, dressed, and covered with an antibacterial ointment if needed. The eyes may also get puffy. The puffiness is worst in the morning and gets better as the day progresses. This dermatitis usually heals without scarring, within 2 to 3 weeks without treatment. HOME CARE INSTRUCTIONS  Thoroughly wash with soap and water as soon as you have been exposed to poison ivy. You have about one half hour to remove the plant resin before it will cause the rash. This washing will destroy the oil or antigen on the skin that is causing, or will cause, the rash. Be sure to wash under your fingernails as any plant resin there will continue to spread the rash. Do not rub skin vigorously when washing affected area. Poison ivy cannot spread if no oil from the plant remains on your body. A rash that has progressed to weeping sores will not spread the rash unless you have not washed thoroughly. It is also important to wash any clothes you have been wearing as these may carry active allergens. The rash will return if you wear the unwashed clothing, even several days later. Avoidance of the plant in the future is the best measure. Poison ivy plant can be recognized by the number of leaves. Generally, poison ivy has three leaves with flowering branches on a single stem. Diphenhydramine may be purchased over the counter and used as needed for itching. Do not drive with  this medication if it makes you drowsy.Ask your caregiver about medication for children. SEEK MEDICAL CARE IF:  Open sores develop.  Redness spreads beyond area of rash.  You notice purulent (pus-like) discharge.  You have increased pain.  Other signs of infection develop (such as fever).   This information is not intended to replace advice given to you by your health care provider. Make sure you discuss any questions you have with your health care provider.   Document Released: 06/11/2000 Document Revised: 09/06/2011 Document Reviewed: 11/20/2014 Elsevier Interactive Patient Education 2016 [employer].     IF you received an x-ray today, you will receive an invoice from [facility]. Please contact [facility] at [phone] with questions or concerns regarding your invoice.   IF you received labwork today, you will receive an invoice from [employer]. Please contact [facility] at [phone] with questions or concerns regarding your invoice.   Our billing staff will not be able to assist you with questions regarding bills from these companies.  You will be contacted with the lab results as soon as they are available. The fastest way to get your results is to activate your My Chart account. Instructions are located on the last page of this paperwork. If you have not heard from [place] regarding the results in 2 weeks, please contact this office.

## 2015-12-15 NOTE — Progress Notes (Signed)
    MRN: [id number] DOB: August 09, 1950  Subjective:   Robert Robertson. is a 65 y.o. male presenting for follow up on right hand swelling, contact dermatitis.   Patient was seen initially on 12/12/2015, started on prednisone for suspected contact dermatitis. Today, he reports significant improvement in his rash, hand swelling, redness and pain. Denies fever, itching, facial swelling, wheezing, n/v, abdominal pain. He is happy with the progress and will continue to take prednisone. His blood sugar today was 129. Labs reviewed and were mostly normal, except for elevated LFTs. See plan below.  Fishel has a current medication list which includes the following prescription(s): aspirin, atorvastatin, cinnamon, freestyle lite, krill oil, freestyle, lisinopril, multiple vitamins-minerals, prednisone, and prednisone. Also is allergic to pollen extract.  Chozen  has a past medical history of Hypertension; Diabetes mellitus; Allergy; Hyperlipidemia; and Colon polyp. Also  has past surgical history that includes Colonoscopy (2010).  Objective:   Vitals: BP 116/70 mmHg  Pulse 89  Temp(Src) 99 F (37.2 C) (Oral)  Resp 18  Ht 5' 8.5" (1.74 m)  Wt 196 lb (88.905 kg)  BMI 29.36 kg/m2  SpO2 97%  Physical Exam  Constitutional: He is oriented to person, place, and time. He appears well-developed and well-nourished.  HENT:  Mouth/Throat: Oropharynx is clear and moist.  Eyes: Pupils are equal, round, and reactive to light. Right eye exhibits no discharge. Left eye exhibits no discharge.  Neck: Normal range of motion. Neck supple.  Cardiovascular: Normal rate, regular rhythm and intact distal pulses.  Exam reveals no gallop and no friction rub.   No murmur heard. Pulmonary/Chest: No respiratory distress. He has no wheezes. He has no rales.  Abdominal: Soft. Bowel sounds are normal. He exhibits no distension and no mass. There is no tenderness.  Musculoskeletal: Normal range of motion. He exhibits no edema  or tenderness.  Lymphadenopathy:    He has no cervical adenopathy.  Neurological: He is alert and oriented to person, place, and time.  Skin: Skin is warm and dry. No rash noted. No erythema. No pallor.  Psychiatric: He has a normal mood and affect.   Assessment and Plan :   1. Hepatic steatosis - Labs reviewed with patient, drinks 2+ glasses of wine per night. He agreed to cut back on this.  2. Essential hypertension 3. Diabetes mellitus type 2 in nonobese (HCC) - Stable, continue current regimen.  4. Erythema of hand 5. Swelling of right hand 6. Contact dermatitis due to plant - Significantly improved, anticipatory guidance provided. Call in 2 weeks if rash, redness returns, okay to extend steroid course at that point.  Jaynee Eagles, PA-C [facility] [phone] 12/15/2015 5:55 PM

## 2016-02-12 DIAGNOSIS — H2513 Age-related nuclear cataract, bilateral: Secondary | ICD-10-CM | POA: Diagnosis not present

## 2016-02-12 DIAGNOSIS — H40003 Preglaucoma, unspecified, bilateral: Secondary | ICD-10-CM | POA: Diagnosis not present

## 2016-02-17 LAB — HM DIABETES EYE EXAM

## 2016-02-18 ENCOUNTER — Telehealth: Payer: Self-pay

## 2016-02-18 DIAGNOSIS — E1169 Type 2 diabetes mellitus with other specified complication: Secondary | ICD-10-CM

## 2016-02-18 DIAGNOSIS — E119 Type 2 diabetes mellitus without complications: Secondary | ICD-10-CM

## 2016-02-18 DIAGNOSIS — E785 Hyperlipidemia, unspecified: Principal | ICD-10-CM

## 2016-02-18 NOTE — Telephone Encounter (Signed)
Pt coming for labs 02/19/16. Please place future orders. Thank you.

## 2016-02-19 ENCOUNTER — Other Ambulatory Visit (INDEPENDENT_AMBULATORY_CARE_PROVIDER_SITE_OTHER): Payer: Medicare Other

## 2016-02-19 DIAGNOSIS — E1169 Type 2 diabetes mellitus with other specified complication: Secondary | ICD-10-CM | POA: Diagnosis not present

## 2016-02-19 DIAGNOSIS — E119 Type 2 diabetes mellitus without complications: Secondary | ICD-10-CM | POA: Diagnosis not present

## 2016-02-19 DIAGNOSIS — E785 Hyperlipidemia, unspecified: Secondary | ICD-10-CM

## 2016-02-19 LAB — MICROALBUMIN / CREATININE URINE RATIO
Creatinine,U: 191 mg/dL
MICROALB/CREAT RATIO: 0.4 mg/g (ref 0.0–30.0)
Microalb, Ur: <0.7 mg/dL (ref 0.0–1.9)

## 2016-02-19 LAB — HEMOGLOBIN A1C: HEMOGLOBIN A1C: 7.4 % — AB (ref 4.6–6.5)

## 2016-02-19 LAB — LIPID PANEL
CHOL/HDL RATIO: 2
Cholesterol: 118 mg/dL (ref 0–200)
HDL: 54.6 mg/dL (ref >39.00)
LDL CALC: 49 mg/dL (ref 0–99)
NONHDL: 63.85
Triglycerides: 72 mg/dL (ref 0.0–149.0)
VLDL: 14.4 mg/dL (ref 0.0–40.0)

## 2016-02-19 LAB — COMPREHENSIVE METABOLIC PANEL
ALK PHOS: 74 U/L (ref 39–117)
ALT: 39 U/L (ref 0–53)
AST: 32 U/L (ref 0–37)
Albumin: 4.3 g/dL (ref 3.5–5.2)
BILIRUBIN TOTAL: 0.9 mg/dL (ref 0.2–1.2)
BUN: 12 mg/dL (ref 6–23)
CO2: 29 meq/L (ref 19–32)
CREATININE: 0.86 mg/dL (ref 0.40–1.50)
Calcium: 8.9 mg/dL (ref 8.4–10.5)
Chloride: 103 mEq/L (ref 96–112)
GFR: 114.69 mL/min (ref >60.00)
GLUCOSE: 106 mg/dL — AB (ref 70–99)
Potassium: 4.2 mEq/L (ref 3.5–5.1)
SODIUM: 137 meq/L (ref 135–145)
TOTAL PROTEIN: 7.2 g/dL (ref 6.0–8.3)

## 2016-02-20 ENCOUNTER — Other Ambulatory Visit: Payer: Medicare Other

## 2016-02-22 ENCOUNTER — Encounter: Payer: Self-pay | Admitting: Internal Medicine

## 2016-02-27 ENCOUNTER — Ambulatory Visit (INDEPENDENT_AMBULATORY_CARE_PROVIDER_SITE_OTHER): Payer: Medicare Other | Admitting: Internal Medicine

## 2016-02-27 ENCOUNTER — Encounter: Payer: Self-pay | Admitting: Internal Medicine

## 2016-02-27 VITALS — BP 118/76 | HR 76 | Temp 98.4°F | Resp 12 | Ht 69.0 in | Wt 194.0 lb

## 2016-02-27 DIAGNOSIS — E785 Hyperlipidemia, unspecified: Secondary | ICD-10-CM

## 2016-02-27 DIAGNOSIS — E669 Obesity, unspecified: Secondary | ICD-10-CM | POA: Diagnosis not present

## 2016-02-27 DIAGNOSIS — L603 Nail dystrophy: Secondary | ICD-10-CM

## 2016-02-27 DIAGNOSIS — B351 Tinea unguium: Secondary | ICD-10-CM

## 2016-02-27 DIAGNOSIS — E119 Type 2 diabetes mellitus without complications: Secondary | ICD-10-CM

## 2016-02-27 DIAGNOSIS — K760 Fatty (change of) liver, not elsewhere classified: Secondary | ICD-10-CM

## 2016-02-27 DIAGNOSIS — Z23 Encounter for immunization: Secondary | ICD-10-CM | POA: Diagnosis not present

## 2016-02-27 DIAGNOSIS — E1169 Type 2 diabetes mellitus with other specified complication: Secondary | ICD-10-CM

## 2016-02-27 LAB — HM DIABETES FOOT EXAM: HM Diabetic Foot Exam: NORMAL

## 2016-02-27 MED ORDER — METFORMIN HCL ER 500 MG PO TB24: 500.0000 mg | ORAL_TABLET | Freq: Every day | ORAL | 2 refills | Status: DC

## 2016-02-27 MED ORDER — ZOSTER VACCINE LIVE 19400 UNT/0.65ML ~~LOC~~ SUSR: 0.6500 mL | Freq: Once | SUBCUTANEOUS | 0 refills | Status: AC

## 2016-02-27 NOTE — Patient Instructions (Signed)
I am starting your on Metformin ER,  A medicatio nto help lower your blood sugars gradually  Take it once daily with breakfast  It may cause transient nausea  or loose stools,.  If this does not resolve after a week or two,  Let me know   .  You may be interested in getting the Shingles  vaccine  but you can get it for less $$$ at a local pharmacy with the script I have provided you.   Returni n 3 months   Referral to Podiatry is in process  You can work on your toenail fungal infection . Try treating your toenail fungus naturally with vinegar or tea tree oil dabbed on the top of the nail daily after a few strokes with a disposable emery board

## 2016-02-27 NOTE — Progress Notes (Signed)
Subjective:  Patient ID: Robert Lat., male    DOB: [birth year]  Age: 65 y.o. MRN: [id number]  CC: The primary encounter diagnosis was Onychomycosis. Diagnoses of Dystrophic nail, Diabetes mellitus type 2 in obese ([place]), Obesity (BMI 30-39.9), Hepatic steatosis, and Hyperlipidemia were also pertinent to this visit.  HPI Robert Robertson. presents for follow up on diabetes  Type 2 , hypertension, hyperlipidemia and obesity  He feels generally well, is exercising several times per week and checking blood sugars once daily at variable times.  BS have been under 130 fasting and < 150 post prandially.  Denies any recent hypoglyemic events.  Taking his medications as directed. Following a carbohydrate modified diet 6 days per week. Denies numbness, burning and tingling of extremities. Appetite is good.   7 day average 147 14 day 129 .  Fasting  Only    Due for foot check  Done today  Eye exam was done 2 weeks ago  chronic Calf asymmetry left calf is larger than  right . First noticed after L4 back surgery,  2008. On todays exam , he has edema noted left calf.  Left leg occasionally feels weak in the ankle and he will frequently evert It    fungla infeciton by last podiatry [facility] Oct 2016,  treatment deferred   Lab Results  Component Value Date   ALT 39 02/19/2016   AST 32 02/19/2016   ALKPHOS 74 02/19/2016   BILITOT 0.9 02/19/2016    Foot exam normal, dystorphic nails.  Prior podiatry  Lab Results  Component Value Date   HGBA1C 7.4 (H) 02/19/2016     Outpatient Medications Prior to Visit  Medication Sig Dispense Refill  . aspirin 81 MG tablet Take 81 mg by mouth daily.    [name] atorvastatin (LIPITOR) 80 MG tablet Take 1 tablet (80 mg total) by mouth daily. 90 tablet 3  . Cinnamon 500 MG capsule Take 1,000 mg by mouth daily.     [name] FREESTYLE LITE test strip CHECK SUGARS TWICE A DAY (Patient taking differently: CHECK SUGARS TWICE A) 200 each 1  . Krill Oil 300 MG CAPS Take  1 capsule by mouth daily.    . Lancets (FREESTYLE) lancets Check sugar bid 200 each 1  . lisinopril (PRINIVIL,ZESTRIL) 10 MG tablet TAKE 1 TABLET DAILY 90 tablet 3  . Multiple Vitamins-Minerals (CENTRUM SILVER ADULT 50+ PO) Take 1 tablet by mouth daily.    . predniSONE (DELTASONE) 10 MG tablet Take 1 tablet daily for the last 5 days. 21 tablet 0  . predniSONE (DELTASONE) 20 MG tablet Take 2 tablets daily with breakfast for 5 days. Then take 1 tablet daily for another 5 days. 15 tablet 0   No facility-administered medications prior to visit.     Review of Systems;  Patient denies headache, fevers, malaise, unintentional weight loss, skin rash, eye pain, sinus congestion and sinus pain, sore throat, dysphagia,  hemoptysis , cough, dyspnea, wheezing, chest pain, palpitations, orthopnea, edema, abdominal pain, nausea, melena, diarrhea, constipation, flank pain, dysuria, hematuria, urinary  Frequency, nocturia, numbness, tingling, seizures,  Focal weakness, Loss of consciousness,  Tremor, insomnia, depression, anxiety, and suicidal ideation.      Objective:  BP 118/76   Pulse 76   Temp 98.4 F (36.9 C) (Oral)   Resp 12   Ht 5\' 9"  (1.753 m)   Wt 194 lb (88 kg)   SpO2 98%   BMI 28.65 kg/m   BP Readings from Last 3  Encounters:  02/27/16 118/76  12/15/15 116/70  12/12/15 132/82    Wt Readings from Last 3 Encounters:  02/27/16 194 lb (88 kg)  12/15/15 196 lb (88.9 kg)  12/12/15 196 lb (88.9 kg)    General appearance: alert, cooperative and appears stated age Ears: normal TM's and external ear canals both ears Throat: lips, mucosa, and tongue normal; teeth and gums normal Neck: no adenopathy, no carotid bruit, supple, symmetrical, trachea midline and thyroid not enlarged, symmetric, no tenderness/mass/nodules Back: symmetric, no curvature. ROM normal. No CVA tenderness. Lungs: clear to auscultation bilaterally Heart: regular rate and rhythm, S1, S2 normal, no murmur, click, rub or  gallop Abdomen: soft, non-tender; bowel sounds normal; no masses,  no organomegaly Pulses: 2+ and symmetric Skin: Skin color, texture, turgor normal. No rashes or lesions. Trace pitting edema left lower leg to mid tibia.  Lymph nodes: Cervical, supraclavicular, and axillary nodes normal.  Lab Results  Component Value Date   HGBA1C 7.4 (H) 02/19/2016   HGBA1C 7.5 (H) 11/07/2015   HGBA1C 7.5 (H) 08/08/2015    Lab Results  Component Value Date   CREATININE 0.86 02/19/2016   CREATININE 1.10 12/12/2015   CREATININE 0.87 11/07/2015    Lab Results  Component Value Date   WBC 6.2 12/12/2015   HGB 14.6 12/12/2015   HCT 41.7 (A) 12/12/2015   PLT 154 02/28/2012   GLUCOSE 106 (H) 02/19/2016   CHOL 118 02/19/2016   TRIG 72.0 02/19/2016   HDL 54.60 02/19/2016   LDLDIRECT 81.0 11/07/2015   LDLCALC 49 02/19/2016   ALT 39 02/19/2016   AST 32 02/19/2016   NA 137 02/19/2016   K 4.2 02/19/2016   CL 103 02/19/2016   CREATININE 0.86 02/19/2016   BUN 12 02/19/2016   CO2 29 02/19/2016   PSA 2.00 05/14/2014   INR 0.99 02/29/2012   HGBA1C 7.4 (H) 02/19/2016   MICROALBUR <0.7 02/19/2016    US Abdomen Limited Ruq  Result Date: 12/05/2015 CLINICAL DATA:  Follow-up hepatic steatosis. No pain or other symptoms EXAM: US ABDOMEN LIMITED - RIGHT UPPER QUADRANT COMPARISON:  Ultrasound of the liver March 10, 2015 FINDINGS: Gallbladder: No gallstones or wall thickening visualized. No sonographic Murphy sign noted by sonographer. Common bile duct: Diameter: 4.7 mm Liver: The hepatic echotexture is heterogeneously increased. There is no focal mass nor ductal dilation. The surface contour of the liver is normal. IMPRESSION: Heterogeneously increased hepatic echotexture likely reflecting fatty infiltrative change. No gallstones or other acute hepatobiliary abnormality. Electronically Signed   By: David  Martinique M.D.   On: 12/05/2015 09:51    Assessment & Plan:   Problem List Items Addressed This Visit     Diabetes mellitus type 2 in obese ([place])    Starting metformin for a1c 7.4 and obesity,   Up to date on eye and foot exam,s  podiatry referral in process.   Lab Results  Component Value Date   HGBA1C 7.4 (H) 02/19/2016   Lab Results  Component Value Date   MICROALBUR <0.7 02/19/2016   Lab Results  Component Value Date   LDLCALC 49 02/19/2016         Relevant Medications   metFORMIN (GLUCOPHAGE-XR) 500 MG 24 hr tablet   Hyperlipidemia    LDL and triglycerides are at goal on current medications. He has no side effects and liver enzymes are normal. No changes today  Lab Results  Component Value Date   CHOL 118 02/19/2016   HDL 54.60 02/19/2016   LDLCALC 49 02/19/2016  LDLDIRECT 81.0 11/07/2015   TRIG 72.0 02/19/2016   CHOLHDL 2 02/19/2016   Lab Results  Component Value Date   ALT 39 02/19/2016   AST 32 02/19/2016   ALKPHOS 74 02/19/2016   BILITOT 0.9 02/19/2016           Obesity (BMI 30-39.9)    I have addressed  BMI and recommended wt loss of 10% of body weight over the next 6 months using a low fat, low starch, high protein  fruit/vegetable based Mediterranean diet and 30 minutes of aerobic exercise a minimum of 5 days per week.        Relevant Medications   metFORMIN (GLUCOPHAGE-XR) 500 MG 24 hr tablet   Hepatic steatosis    Suggested by ultrasound in September.  Serologies for iron overload ,  autoimmune and viral infections were  negative. Reminded to limit  alcohol to 1 glass  of wine per night.     Vaccinations for Hepatitis A and B have been completed and statin therapy was resumed ,  Current enymes are normal.   Lab Results  Component Value Date   ALT 39 02/19/2016   AST 32 02/19/2016   ALKPHOS 74 02/19/2016   BILITOT 0.9 02/19/2016            Other Visit Diagnoses    Onychomycosis    -  Primary   Relevant Orders   Ambulatory referral to Podiatry   Dystrophic nail       Relevant Orders   Ambulatory referral to Podiatry     A total  of 25 minutes of face to face time was spent with patient more than half of which was spent in counselling about the above mentioned conditions  and coordination of care  I have discontinued Robert Robertson's predniSONE and predniSONE. I am also having him start on metFORMIN and Zoster Vaccine Live (PF). Additionally, I am having him maintain his aspirin, Krill Oil, freestyle, Multiple Vitamins-Minerals (CENTRUM SILVER ADULT 50+ PO), FREESTYLE LITE, Cinnamon, atorvastatin, and lisinopril.  Meds ordered this encounter  Medications  . metFORMIN (GLUCOPHAGE-XR) 500 MG 24 hr tablet    Sig: Take 1 tablet (500 mg total) by mouth daily with breakfast.    Dispense:  30 tablet    Refill:  2  . Zoster Vaccine Live, PF, (ZOSTAVAX) [id number] UNT/0.65ML injection    Sig: Inject 19,400 Units into the skin once.    Dispense:  1 each    Refill:  0    Medications Discontinued During This Encounter  Medication Reason  . predniSONE (DELTASONE) 10 MG tablet Completed Course  . predniSONE (DELTASONE) 20 MG tablet Completed Course    Follow-up: Return in about 3 months (around 05/28/2016) for follow up diabetes, labs on or after Nov 24.   [name], MD

## 2016-02-27 NOTE — Progress Notes (Signed)
Pre-visit discussion using our clinic review tool. No additional management support is needed unless otherwise documented below in the visit note.  

## 2016-03-01 ENCOUNTER — Encounter: Payer: Self-pay | Admitting: Internal Medicine

## 2016-03-01 NOTE — Assessment & Plan Note (Signed)
LDL and triglycerides are at goal on current medications. He has no side effects and liver enzymes are normal. No changes today  Lab Results  Component Value Date   CHOL 118 02/19/2016   HDL 54.60 02/19/2016   LDLCALC 49 02/19/2016   LDLDIRECT 81.0 11/07/2015   TRIG 72.0 02/19/2016   CHOLHDL 2 02/19/2016   Lab Results  Component Value Date   ALT 39 02/19/2016   AST 32 02/19/2016   ALKPHOS 74 02/19/2016   BILITOT 0.9 02/19/2016

## 2016-03-01 NOTE — Assessment & Plan Note (Signed)
Suggested by ultrasound in September.  Serologies for iron overload ,  autoimmune and viral infections were  negative. Reminded to limit  alcohol to 1 glass  of wine per night.     Vaccinations for Hepatitis A and B have been completed and statin therapy was resumed ,  Current enymes are normal.   Lab Results  Component Value Date   ALT 39 02/19/2016   AST 32 02/19/2016   ALKPHOS 74 02/19/2016   BILITOT 0.9 02/19/2016

## 2016-03-01 NOTE — Assessment & Plan Note (Signed)
Starting metformin for a1c 7.4 and obesity,   Up to date on eye and foot exam,s  podiatry referral in process.   Lab Results  Component Value Date   HGBA1C 7.4 (H) 02/19/2016   Lab Results  Component Value Date   MICROALBUR <0.7 02/19/2016   Lab Results  Component Value Date   LDLCALC 49 02/19/2016

## 2016-03-01 NOTE — Assessment & Plan Note (Signed)
I have addressed  BMI and recommended wt loss of 10% of body weight over the next 6 months using a low fat, low starch, high protein  fruit/vegetable based Mediterranean diet and 30 minutes of aerobic exercise a minimum of 5 days per week.   

## 2016-03-02 ENCOUNTER — Encounter: Payer: Self-pay | Admitting: Internal Medicine

## 2016-03-30 ENCOUNTER — Telehealth: Payer: Self-pay | Admitting: Internal Medicine

## 2016-03-30 NOTE — Telephone Encounter (Signed)
Pt came in to drop off a copy of his immunization record. It's in Dr Conchita Paris folder up front. Thank you!

## 2016-04-01 ENCOUNTER — Encounter: Payer: Self-pay | Admitting: Internal Medicine

## 2016-04-05 NOTE — Telephone Encounter (Signed)
Chart updated

## 2016-04-19 ENCOUNTER — Other Ambulatory Visit: Payer: Self-pay | Admitting: Internal Medicine

## 2016-05-04 ENCOUNTER — Other Ambulatory Visit: Payer: Self-pay | Admitting: Internal Medicine

## 2016-05-04 MED ORDER — FREESTYLE LANCETS MISC: 2 refills | Status: DC

## 2016-05-12 DIAGNOSIS — B351 Tinea unguium: Secondary | ICD-10-CM | POA: Diagnosis not present

## 2016-05-12 DIAGNOSIS — E1142 Type 2 diabetes mellitus with diabetic polyneuropathy: Secondary | ICD-10-CM | POA: Diagnosis not present

## 2016-05-24 ENCOUNTER — Telehealth: Payer: Self-pay

## 2016-05-24 DIAGNOSIS — E1169 Type 2 diabetes mellitus with other specified complication: Secondary | ICD-10-CM

## 2016-05-24 DIAGNOSIS — E7800 Pure hypercholesterolemia, unspecified: Secondary | ICD-10-CM

## 2016-05-24 DIAGNOSIS — E669 Obesity, unspecified: Secondary | ICD-10-CM

## 2016-05-24 NOTE — Telephone Encounter (Signed)
Pt coming for fasting labs 05/25/16. Please place future orders. Thank you.

## 2016-05-25 ENCOUNTER — Other Ambulatory Visit (INDEPENDENT_AMBULATORY_CARE_PROVIDER_SITE_OTHER): Payer: Medicare Other

## 2016-05-25 DIAGNOSIS — E669 Obesity, unspecified: Secondary | ICD-10-CM

## 2016-05-25 DIAGNOSIS — E7800 Pure hypercholesterolemia, unspecified: Secondary | ICD-10-CM | POA: Diagnosis not present

## 2016-05-25 DIAGNOSIS — E1169 Type 2 diabetes mellitus with other specified complication: Secondary | ICD-10-CM | POA: Diagnosis not present

## 2016-05-25 LAB — COMPREHENSIVE METABOLIC PANEL
ALK PHOS: 70 U/L (ref 39–117)
ALT: 39 U/L (ref 0–53)
AST: 30 U/L (ref 0–37)
Albumin: 4.2 g/dL (ref 3.5–5.2)
BILIRUBIN TOTAL: 0.7 mg/dL (ref 0.2–1.2)
BUN: 12 mg/dL (ref 6–23)
CALCIUM: 9.5 mg/dL (ref 8.4–10.5)
CO2: 29 meq/L (ref 19–32)
CREATININE: 0.86 mg/dL (ref 0.40–1.50)
Chloride: 103 mEq/L (ref 96–112)
GFR: 114.59 mL/min (ref >60.00)
GLUCOSE: 120 mg/dL — AB (ref 70–99)
Potassium: 4.8 mEq/L (ref 3.5–5.1)
Sodium: 139 mEq/L (ref 135–145)
TOTAL PROTEIN: 6.8 g/dL (ref 6.0–8.3)

## 2016-05-25 LAB — LIPID PANEL
Cholesterol: 134 mg/dL (ref 0–200)
HDL: 54.5 mg/dL (ref >39.00)
LDL CALC: 70 mg/dL (ref 0–99)
NONHDL: 79.62
Total CHOL/HDL Ratio: 2
Triglycerides: 49 mg/dL (ref 0.0–149.0)
VLDL: 9.8 mg/dL (ref 0.0–40.0)

## 2016-05-25 LAB — HEMOGLOBIN A1C: Hgb A1c MFr Bld: 7.2 % — ABNORMAL HIGH (ref 4.6–6.5)

## 2016-05-25 LAB — LDL CHOLESTEROL, DIRECT: LDL DIRECT: 68 mg/dL

## 2016-05-28 ENCOUNTER — Encounter: Payer: Self-pay | Admitting: Internal Medicine

## 2016-05-28 ENCOUNTER — Ambulatory Visit (INDEPENDENT_AMBULATORY_CARE_PROVIDER_SITE_OTHER): Payer: Medicare Other | Admitting: Internal Medicine

## 2016-05-28 VITALS — BP 142/78 | HR 81 | Temp 97.8°F | Resp 12 | Ht 69.0 in | Wt 198.5 lb

## 2016-05-28 DIAGNOSIS — E1169 Type 2 diabetes mellitus with other specified complication: Secondary | ICD-10-CM | POA: Diagnosis not present

## 2016-05-28 DIAGNOSIS — E7800 Pure hypercholesterolemia, unspecified: Secondary | ICD-10-CM

## 2016-05-28 DIAGNOSIS — I10 Essential (primary) hypertension: Secondary | ICD-10-CM

## 2016-05-28 DIAGNOSIS — E669 Obesity, unspecified: Secondary | ICD-10-CM

## 2016-05-28 LAB — HM DIABETES FOOT EXAM

## 2016-05-28 NOTE — Progress Notes (Signed)
Pre-visit discussion using our clinic review tool. No additional management support is needed unless otherwise documented below in the visit note.  

## 2016-05-28 NOTE — Progress Notes (Signed)
Subjective:  Patient ID: Robert Lat., male    DOB: 08-Jan-1951  Age: 65 y.o. MRN: [id number]  CC: There were no encounter diagnoses.  HPI Robert Robertson. presents for 3 month follow up on diabetes.  Patient has no complaints today.  Patient is following a low glycemic index diet and taking all prescribed medications regularly without side effects.  Fasting sugars  And post prandial  been averaging 120  and post prandials have been under 160 except on rare occasions. Patient is exercising about 3 times per week and intentionally trying to lose weight .  Patient has had an eye exam in the last 12 months and checks feet regularly for signs of infection.  Patient does not walk barefoot outside,  And denies an numbness tingling or burning in feet. Patient is up to date on all recommended vaccinations  Had an episode of Light headedness that caused him to fall.  Improved after eating candy.  Occurred in the morning  before breakfast .  Had not taken merformin yet.  takes metformin 500 mg daily in the morning with breakfast    Saw Podiatry Dr Vickki Muff for diabetic neuropathy  And nail trimming   Has neuropathy e very 6 months .     Cc:    Lab Results  Component Value Date   HGBA1C 7.2 (H) 05/25/2016   Lab Results  Component Value Date   CHOL 134 05/25/2016   HDL 54.50 05/25/2016   LDLCALC 70 05/25/2016   LDLDIRECT 68.0 05/25/2016   TRIG 49.0 05/25/2016   CHOLHDL 2 05/25/2016   Lab Results  Component Value Date   MICROALBUR <0.7 02/19/2016     Outpatient Medications Prior to Visit  Medication Sig Dispense Refill  . aspirin 81 MG tablet Take 81 mg by mouth daily.    [name] atorvastatin (LIPITOR) 80 MG tablet TAKE 1 TABLET DAILY 90 tablet 3  . FREESTYLE LITE test strip CHECK SUGARS TWICE A DAY (Patient taking differently: CHECK SUGARS TWICE A) 200 each 1  . Krill Oil 300 MG CAPS Take 1 capsule by mouth daily.    . Lancets (FREESTYLE) lancets Check sugar bid 200 each 2  .  lisinopril (PRINIVIL,ZESTRIL) 10 MG tablet TAKE 1 TABLET DAILY 90 tablet 3  . metFORMIN (GLUCOPHAGE-XR) 500 MG 24 hr tablet Take 1 tablet (500 mg total) by mouth daily with breakfast. 30 tablet 2  . Multiple Vitamins-Minerals (CENTRUM SILVER ADULT 50+ PO) Take 1 tablet by mouth daily.    . Cinnamon 500 MG capsule Take 1,000 mg by mouth daily.      No facility-administered medications prior to visit.     Review of Systems;  Patient denies headache, fevers, malaise, unintentional weight loss, skin rash, eye pain, sinus congestion and sinus pain, sore throat, dysphagia,  hemoptysis , cough, dyspnea, wheezing, chest pain, palpitations, orthopnea, edema, abdominal pain, nausea, melena, diarrhea, constipation, flank pain, dysuria, hematuria, urinary  Frequency, nocturia, numbness, tingling, seizures,  Focal weakness, Loss of consciousness,  Tremor, insomnia, depression, anxiety, and suicidal ideation.      Objective:  BP (!) 142/78   Pulse 81   Temp 97.8 F (36.6 C) (Oral)   Resp 12   Ht 5\' 9"  (1.753 m)   Wt 198 lb 8 oz (90 kg)   SpO2 95%   BMI 29.31 kg/m   BP Readings from Last 3 Encounters:  05/28/16 (!) 142/78  02/27/16 118/76  12/15/15 116/70    Wt Readings from Last  3 Encounters:  05/28/16 198 lb 8 oz (90 kg)  02/27/16 194 lb (88 kg)  12/15/15 196 lb (88.9 kg)    General appearance: alert, cooperative and appears stated age Ears: normal TM's and external ear canals both ears Throat: lips, mucosa, and tongue normal; teeth and gums normal Neck: no adenopathy, no carotid bruit, supple, symmetrical, trachea midline and thyroid not enlarged, symmetric, no tenderness/mass/nodules Back: symmetric, no curvature. ROM normal. No CVA tenderness. Lungs: clear to auscultation bilaterally Heart: regular rate and rhythm, S1, S2 normal, no murmur, click, rub or gallop Abdomen: soft, non-tender; bowel sounds normal; no masses,  no organomegaly Pulses: 2+ and symmetric Skin: Skin color,  texture, turgor normal. No rashes or lesions Lymph nodes: Cervical, supraclavicular, and axillary nodes normal.  Lab Results  Component Value Date   HGBA1C 7.2 (H) 05/25/2016   HGBA1C 7.4 (H) 02/19/2016   HGBA1C 7.5 (H) 11/07/2015    Lab Results  Component Value Date   CREATININE 0.86 05/25/2016   CREATININE 0.86 02/19/2016   CREATININE 1.10 12/12/2015    Lab Results  Component Value Date   WBC 6.2 12/12/2015   HGB 14.6 12/12/2015   HCT 41.7 (A) 12/12/2015   PLT 154 02/28/2012   GLUCOSE 120 (H) 05/25/2016   CHOL 134 05/25/2016   TRIG 49.0 05/25/2016   HDL 54.50 05/25/2016   LDLDIRECT 68.0 05/25/2016   LDLCALC 70 05/25/2016   ALT 39 05/25/2016   AST 30 05/25/2016   NA 139 05/25/2016   K 4.8 05/25/2016   CL 103 05/25/2016   CREATININE 0.86 05/25/2016   BUN 12 05/25/2016   CO2 29 05/25/2016   PSA 2.00 05/14/2014   INR 0.99 02/29/2012   HGBA1C 7.2 (H) 05/25/2016   MICROALBUR <0.7 02/19/2016    US Abdomen Limited Ruq  Result Date: 12/05/2015 CLINICAL DATA:  Follow-up hepatic steatosis. No pain or other symptoms EXAM: US ABDOMEN LIMITED - RIGHT UPPER QUADRANT COMPARISON:  Ultrasound of the liver March 10, 2015 FINDINGS: Gallbladder: No gallstones or wall thickening visualized. No sonographic Murphy sign noted by sonographer. Common bile duct: Diameter: 4.7 mm Liver: The hepatic echotexture is heterogeneously increased. There is no focal mass nor ductal dilation. The surface contour of the liver is normal. IMPRESSION: Heterogeneously increased hepatic echotexture likely reflecting fatty infiltrative change. No gallstones or other acute hepatobiliary abnormality. Electronically Signed   By: David  Martinique M.D.   On: 12/05/2015 09:51    Assessment & Plan:   Problem List Items Addressed This Visit    None      I have discontinued [name]'s Cinnamon. I am also having him maintain his aspirin, Krill Oil, Multiple Vitamins-Minerals (CENTRUM SILVER ADULT 50+ PO),  FREESTYLE LITE, lisinopril, metFORMIN, atorvastatin, and freestyle.  No orders of the defined types were placed in this encounter.   Medications Discontinued During This Encounter  Medication Reason  . Cinnamon 500 MG capsule Change in therapy    Follow-up: No Follow-up on file.   [name], MD

## 2016-05-28 NOTE — Patient Instructions (Signed)
Your diabetes remains under excellent control  And your cholesterol and other labs are also normal. Please continue your current medications. If your episode recurs,  Try suspending the metformin.    Please return in 3 months for follow up on diabetes and make sure you are seeing your eye doctor at least once a year.

## 2016-05-29 ENCOUNTER — Encounter: Payer: Self-pay | Admitting: Internal Medicine

## 2016-05-30 NOTE — Assessment & Plan Note (Signed)
Improved with metformin  But recent episode of Light headedness that improved with eating candy was discussed.  Symptoms  did NOT include tachycardia, diaphoresis . Advised to check pulse, blood sugar and bp next occurrence.   Lab Results  Component Value Date   HGBA1C 7.2 (H) 05/25/2016   Lab Results  Component Value Date   MICROALBUR <0.7 02/19/2016   Lab Results  Component Value Date   LDLCALC 70 05/25/2016

## 2016-05-30 NOTE — Assessment & Plan Note (Signed)
LDL and triglycerides are at goal on current medications. He has no side effects and liver enzymes are normal. No changes today  Lab Results  Component Value Date   CHOL 134 05/25/2016   HDL 54.50 05/25/2016   LDLCALC 70 05/25/2016   LDLDIRECT 68.0 05/25/2016   TRIG 49.0 05/25/2016   CHOLHDL 2 05/25/2016   Lab Results  Component Value Date   ALT 39 05/25/2016   AST 30 05/25/2016   ALKPHOS 70 05/25/2016   BILITOT 0.7 05/25/2016

## 2016-05-30 NOTE — Assessment & Plan Note (Signed)
Well controlled on current regimen. Renal function stable, no changes today.  Lab Results  Component Value Date   CREATININE 0.86 05/25/2016   Lab Results  Component Value Date   NA 139 05/25/2016   K 4.8 05/25/2016   CL 103 05/25/2016   CO2 29 05/25/2016

## 2016-06-06 ENCOUNTER — Other Ambulatory Visit: Payer: Self-pay | Admitting: Internal Medicine

## 2016-08-31 ENCOUNTER — Other Ambulatory Visit (INDEPENDENT_AMBULATORY_CARE_PROVIDER_SITE_OTHER): Payer: Medicare Other

## 2016-08-31 DIAGNOSIS — E7800 Pure hypercholesterolemia, unspecified: Secondary | ICD-10-CM

## 2016-08-31 DIAGNOSIS — E669 Obesity, unspecified: Secondary | ICD-10-CM | POA: Diagnosis not present

## 2016-08-31 DIAGNOSIS — E1169 Type 2 diabetes mellitus with other specified complication: Secondary | ICD-10-CM

## 2016-08-31 LAB — COMPREHENSIVE METABOLIC PANEL
ALT: 38 U/L (ref 0–53)
AST: 30 U/L (ref 0–37)
Albumin: 4.3 g/dL (ref 3.5–5.2)
Alkaline Phosphatase: 70 U/L (ref 39–117)
BUN: 12 mg/dL (ref 6–23)
CALCIUM: 9.2 mg/dL (ref 8.4–10.5)
CHLORIDE: 104 meq/L (ref 96–112)
CO2: 29 meq/L (ref 19–32)
Creatinine, Ser: 0.85 mg/dL (ref 0.40–1.50)
GFR: 116.05 mL/min (ref >60.00)
GLUCOSE: 117 mg/dL — AB (ref 70–99)
POTASSIUM: 4.3 meq/L (ref 3.5–5.1)
Sodium: 138 mEq/L (ref 135–145)
Total Bilirubin: 1.1 mg/dL (ref 0.2–1.2)
Total Protein: 6.9 g/dL (ref 6.0–8.3)

## 2016-08-31 LAB — LIPID PANEL
CHOLESTEROL: 126 mg/dL (ref 0–200)
HDL: 50.6 mg/dL (ref >39.00)
LDL CALC: 62 mg/dL (ref 0–99)
NonHDL: 75.06
TRIGLYCERIDES: 67 mg/dL (ref 0.0–149.0)
Total CHOL/HDL Ratio: 2
VLDL: 13.4 mg/dL (ref 0.0–40.0)

## 2016-08-31 LAB — LDL CHOLESTEROL, DIRECT: LDL DIRECT: 59 mg/dL

## 2016-08-31 LAB — HEMOGLOBIN A1C: Hgb A1c MFr Bld: 7.3 % — ABNORMAL HIGH (ref 4.6–6.5)

## 2016-09-03 ENCOUNTER — Encounter: Payer: Self-pay | Admitting: Internal Medicine

## 2016-09-03 ENCOUNTER — Ambulatory Visit (INDEPENDENT_AMBULATORY_CARE_PROVIDER_SITE_OTHER): Payer: Medicare Other | Admitting: Internal Medicine

## 2016-09-03 DIAGNOSIS — K760 Fatty (change of) liver, not elsewhere classified: Secondary | ICD-10-CM

## 2016-09-03 DIAGNOSIS — E1169 Type 2 diabetes mellitus with other specified complication: Secondary | ICD-10-CM

## 2016-09-03 DIAGNOSIS — R040 Epistaxis: Secondary | ICD-10-CM

## 2016-09-03 DIAGNOSIS — E7800 Pure hypercholesterolemia, unspecified: Secondary | ICD-10-CM | POA: Diagnosis not present

## 2016-09-03 DIAGNOSIS — E669 Obesity, unspecified: Secondary | ICD-10-CM

## 2016-09-03 NOTE — Patient Instructions (Addendum)
Your diabetes is still under good control on current regimen, but I recommend getting 30 minutes of daily exercise to help lower your sugars  A little bit more for goal A1c < 7.0  (fastings should be < 130 and 2 hr post prandials < 160)  Your cholesterol is well controlled on current statin therapy and your liver enzymes are normal.  The new goals for optimal blood pressure management are 120/70.  Please check your blood pressure a few times at home and send me the readings so I can determine if you need an increase in your lisinopril  See you in 3 months  The ShingRx vaccine will be available in about 6 months and  Is advised  for all interested adults over 50 to prevent shingles

## 2016-09-03 NOTE — Progress Notes (Signed)
Subjective:  Patient ID: Robert Lat., male    DOB: 08-Apr-1951  Age: 66 y.o. MRN: [id number]  CC: Diagnoses of Obesity (BMI 30-39.9), Pure hypercholesterolemia, Diabetes mellitus type 2 in obese [facility]), Hepatic steatosis, and Recurrent epistaxis were pertinent to this visit.  HPI Robert Robertson. presents for diabetes follow up.  Last a1c was 7.2. Last LDL was 68, now 59 by direct  3 month follow up on diabetes.  Patient has no complaints today.  Patient is following a low glycemic index diet and taking all prescribed medications regularly without side effects.   Patient is exercising about 3 times per week and intentionally trying to lose weight .  Patient has had an eye exam in the last 12 months and checks feet regularly for signs of infection.  Patient does not walk barefoot outside,  And denies an numbness tingling or burning in feet. Patient is up to date on all recommended vaccinations.  He is Not checking sugars,  Has to get  new meter  Has had 2 nosebleeds  Since his last visit, both occurred out of the left nare. One occur whil hunting one while cleaning car .  Both in cold weather  Discussed  [facility] referral  In [place]    [facility] . Going 3 days per week and staying about an hour, doing cardio and weights.    Sister Robert Robertson ,  9 having GB out today in [place]    Lab Results  Component Value Date   HGBA1C 7.3 (H) 08/31/2016   Lab Results  Component Value Date   LDLCALC 62 08/31/2016     Outpatient Medications Prior to Visit  Medication Sig Dispense Refill  . aspirin 81 MG tablet Take 81 mg by mouth daily.    [name] atorvastatin (LIPITOR) 80 MG tablet TAKE 1 TABLET DAILY 90 tablet 3  . FREESTYLE LITE test strip CHECK SUGARS TWICE A DAY (Patient taking differently: CHECK SUGARS TWICE A) 200 each 1  . Krill Oil 300 MG CAPS Take 1 capsule by mouth daily.    . Lancets (FREESTYLE) lancets Check sugar bid 200 each 2  . lisinopril  (PRINIVIL,ZESTRIL) 10 MG tablet TAKE 1 TABLET DAILY 90 tablet 3  . metFORMIN (GLUCOPHAGE-XR) 500 MG 24 hr tablet TAKE 1 TABLET (500 MG TOTAL) BY MOUTH DAILY WITH BREAKFAST. 30 tablet 2  . Multiple Vitamins-Minerals (CENTRUM SILVER ADULT 50+ PO) Take 1 tablet by mouth daily.     No facility-administered medications prior to visit.     Review of Systems;  Patient denies headache, fevers, malaise, unintentional weight loss, skin rash, eye pain, sinus congestion and sinus pain, sore throat, dysphagia,  hemoptysis , cough, dyspnea, wheezing, chest pain, palpitations, orthopnea, edema, abdominal pain, nausea, melena, diarrhea, constipation, flank pain, dysuria, hematuria, urinary  Frequency, nocturia, numbness, tingling, seizures,  Focal weakness, Loss of consciousness,  Tremor, insomnia, depression, anxiety, and suicidal ideation.      Objective:  BP 128/72 (BP Location: Left Arm, Patient Position: Sitting, Cuff Size: Large)   Pulse 88   Resp 15   Ht 5\' 9"  (1.753 m)   Wt 202 lb (91.6 kg)   SpO2 95%   BMI 29.83 kg/m   BP Readings from Last 3 Encounters:  09/03/16 128/72  05/28/16 (!) 142/78  02/27/16 118/76    Wt Readings from Last 3 Encounters:  09/03/16 202 lb (91.6 kg)  05/28/16 198 lb 8 oz (90 kg)  02/27/16 194  lb (88 kg)    General appearance: alert, cooperative and appears stated age Ears: normal TM's and external ear canals both ears Throat: lips, mucosa, and tongue normal; teeth and gums normal Neck: no adenopathy, no carotid bruit, supple, symmetrical, trachea midline and thyroid not enlarged, symmetric, no tenderness/mass/nodules Back: symmetric, no curvature. ROM normal. No CVA tenderness. Lungs: clear to auscultation bilaterally Heart: regular rate and rhythm, S1, S2 normal, no murmur, click, rub or gallop Abdomen: soft, non-tender; bowel sounds normal; no masses,  no organomegaly Pulses: 2+ and symmetric Skin: Skin color, texture, turgor normal. No rashes or  lesions Lymph nodes: Cervical, supraclavicular, and axillary nodes normal.  Lab Results  Component Value Date   HGBA1C 7.3 (H) 08/31/2016   HGBA1C 7.2 (H) 05/25/2016   HGBA1C 7.4 (H) 02/19/2016    Lab Results  Component Value Date   CREATININE 0.85 08/31/2016   CREATININE 0.86 05/25/2016   CREATININE 0.86 02/19/2016    Lab Results  Component Value Date   WBC 6.2 12/12/2015   HGB 14.6 12/12/2015   HCT 41.7 (A) 12/12/2015   PLT 154 02/28/2012   GLUCOSE 117 (H) 08/31/2016   CHOL 126 08/31/2016   TRIG 67.0 08/31/2016   HDL 50.60 08/31/2016   LDLDIRECT 59.0 08/31/2016   LDLCALC 62 08/31/2016   ALT 38 08/31/2016   AST 30 08/31/2016   NA 138 08/31/2016   K 4.3 08/31/2016   CL 104 08/31/2016   CREATININE 0.85 08/31/2016   BUN 12 08/31/2016   CO2 29 08/31/2016   PSA 2.00 05/14/2014   INR 0.99 02/29/2012   HGBA1C 7.3 (H) 08/31/2016   MICROALBUR <0.7 02/19/2016    US Abdomen Limited Ruq  Result Date: 12/05/2015 CLINICAL DATA:  Follow-up hepatic steatosis. No pain or other symptoms EXAM: US ABDOMEN LIMITED - RIGHT UPPER QUADRANT COMPARISON:  Ultrasound of the liver March 10, 2015 FINDINGS: Gallbladder: No gallstones or wall thickening visualized. No sonographic Murphy sign noted by sonographer. Common bile duct: Diameter: 4.7 mm Liver: The hepatic echotexture is heterogeneously increased. There is no focal mass nor ductal dilation. The surface contour of the liver is normal. IMPRESSION: Heterogeneously increased hepatic echotexture likely reflecting fatty infiltrative change. No gallstones or other acute hepatobiliary abnormality. Electronically Signed   By: David  Martinique M.D.   On: 12/05/2015 09:51    Assessment & Plan:   Problem List Items Addressed This Visit    Diabetes mellitus type 2 in obese ([place])    Slight loss of control on metformin alone .  Will have patient check sugars once daily and subit log for evaluation .adding Jardiance will be a consideration   Lab  Results  Component Value Date   HGBA1C 7.3 (H) 08/31/2016   Lab Results  Component Value Date   MICROALBUR <0.7 02/19/2016   Lab Results  Component Value Date   LDLCALC 62 08/31/2016         Hepatic steatosis    Suggested by ultrasound in September 2016.  Serologies for iron overload ,  autoimmune and viral infections were  negative. Reminded to limit  alcohol to 1 glass  of wine per night.     Vaccinations for Hepatitis A and B have been completed and statin therapy was resumed ,  Current enzymes are normal.   Lab Results  Component Value Date   ALT 38 08/31/2016   AST 30 08/31/2016   ALKPHOS 70 08/31/2016   BILITOT 1.1 08/31/2016  Hyperlipidemia    LDL and triglycerides are at goal on current medications. He has no side effects and liver enzymes are normal. No changes today  Lab Results  Component Value Date   CHOL 126 08/31/2016   HDL 50.60 08/31/2016   LDLCALC 62 08/31/2016   LDLDIRECT 59.0 08/31/2016   TRIG 67.0 08/31/2016   CHOLHDL 2 08/31/2016   Lab Results  Component Value Date   ALT 38 08/31/2016   AST 30 08/31/2016   ALKPHOS 70 08/31/2016   BILITOT 1.1 08/31/2016           Obesity (BMI 30-39.9)    I have addressed  BMI and recommended wt loss of 10% of body weigh over the next 6 months using a low glycemic index diet and regular exercise a minimum of 5 days per week.        Recurrent epistaxis      2 recent episodes, left sided,  Previous episodes last May . Referring to ENT for evaluation to rule out mass,  Polyp, etc . .         I am having Mr. Middlesworth maintain his aspirin, Astrid Drafts, Multiple Vitamins-Minerals (CENTRUM SILVER ADULT 50+ PO), FREESTYLE LITE, lisinopril, atorvastatin, freestyle, and metFORMIN.  No orders of the defined types were placed in this encounter.   There are no discontinued medications.  Follow-up: Return in about 3 months (around 12/04/2016) for follow up diabetes.   [name], MD

## 2016-09-03 NOTE — Progress Notes (Signed)
Pre visit review using our clinic review tool, if applicable. No additional management support is needed unless otherwise documented below in the visit note. 

## 2016-09-04 ENCOUNTER — Encounter: Payer: Self-pay | Admitting: Internal Medicine

## 2016-09-05 NOTE — Assessment & Plan Note (Addendum)
Suggested by ultrasound in September 2016.  Serologies for iron overload ,  autoimmune and viral infections were  negative. Reminded to limit  alcohol to 1 glass  of wine per night.     Vaccinations for Hepatitis A and B have been completed and statin therapy was resumed ,  Current enzymes are normal.   Lab Results  Component Value Date   ALT 38 08/31/2016   AST 30 08/31/2016   ALKPHOS 70 08/31/2016   BILITOT 1.1 08/31/2016

## 2016-09-05 NOTE — Assessment & Plan Note (Addendum)
Slight loss of control on metformin alone .  Will have patient check sugars once daily and subit log for evaluation .adding Jardiance will be a consideration   Lab Results  Component Value Date   HGBA1C 7.3 (H) 08/31/2016   Lab Results  Component Value Date   MICROALBUR <0.7 02/19/2016   Lab Results  Component Value Date   LDLCALC 62 08/31/2016

## 2016-09-05 NOTE — Assessment & Plan Note (Signed)
2 recent episodes, left sided,  Previous episodes last May . Referring to ENT for evaluation to rule out mass,  Polyp, etc . .

## 2016-09-05 NOTE — Assessment & Plan Note (Signed)
LDL and triglycerides are at goal on current medications. He has no side effects and liver enzymes are normal. No changes today  Lab Results  Component Value Date   CHOL 126 08/31/2016   HDL 50.60 08/31/2016   LDLCALC 62 08/31/2016   LDLDIRECT 59.0 08/31/2016   TRIG 67.0 08/31/2016   CHOLHDL 2 08/31/2016   Lab Results  Component Value Date   ALT 38 08/31/2016   AST 30 08/31/2016   ALKPHOS 70 08/31/2016   BILITOT 1.1 08/31/2016

## 2016-09-05 NOTE — Assessment & Plan Note (Signed)
I have addressed  BMI and recommended wt loss of 10% of body weigh over the next 6 months using a low glycemic index diet and regular exercise a minimum of 5 days per week.   

## 2016-09-11 ENCOUNTER — Other Ambulatory Visit: Payer: Self-pay | Admitting: Internal Medicine

## 2016-09-16 ENCOUNTER — Telehealth: Payer: Self-pay | Admitting: Internal Medicine

## 2016-09-16 MED ORDER — FREESTYLE FREEDOM LITE W/DEVICE KIT: 1.0000 "application " | PACK | Freq: Two times a day (BID) | 0 refills | Status: DC

## 2016-09-16 NOTE — Telephone Encounter (Signed)
Pt called stating he needs a new glucose meter called Free style freedom light this is one his insurance will pay for. Call pt when everything is completed @ [phone]. Thank you!  Pharmacy is [id number] - [place], [place]

## 2016-09-16 NOTE — Telephone Encounter (Signed)
LMTCB. Need to let pt know that his [facility] [place].

## 2016-09-17 NOTE — Telephone Encounter (Signed)
Spoke with pt and informed him that he rx [facility] [place].

## 2016-10-01 ENCOUNTER — Other Ambulatory Visit: Payer: Self-pay | Admitting: Internal Medicine

## 2016-10-06 ENCOUNTER — Telehealth: Payer: Self-pay | Admitting: Internal Medicine

## 2016-10-06 MED ORDER — GLUCOSE BLOOD VI STRP: ORAL_STRIP | 1 refills | Status: DC

## 2016-10-06 NOTE — Telephone Encounter (Signed)
Rx has been sent  

## 2016-10-06 NOTE — Telephone Encounter (Signed)
Pt called requesting his FREESTYLE LITE test strip. Please advise, thank you!  Call pt @ [place], [place]

## 2016-10-08 DIAGNOSIS — R040 Epistaxis: Secondary | ICD-10-CM | POA: Diagnosis not present

## 2016-10-08 DIAGNOSIS — J301 Allergic rhinitis due to pollen: Secondary | ICD-10-CM | POA: Diagnosis not present

## 2016-10-13 DIAGNOSIS — E1142 Type 2 diabetes mellitus with diabetic polyneuropathy: Secondary | ICD-10-CM | POA: Diagnosis not present

## 2016-10-13 DIAGNOSIS — B351 Tinea unguium: Secondary | ICD-10-CM | POA: Diagnosis not present

## 2016-10-13 DIAGNOSIS — M79672 Pain in left foot: Secondary | ICD-10-CM | POA: Diagnosis not present

## 2016-10-13 DIAGNOSIS — M79671 Pain in right foot: Secondary | ICD-10-CM | POA: Diagnosis not present

## 2016-10-13 DIAGNOSIS — M2042 Other hammer toe(s) (acquired), left foot: Secondary | ICD-10-CM | POA: Diagnosis not present

## 2016-11-03 ENCOUNTER — Telehealth: Payer: Self-pay | Admitting: *Deleted

## 2016-11-03 MED ORDER — METFORMIN HCL ER 500 MG PO TB24: 500.0000 mg | ORAL_TABLET | Freq: Every day | ORAL | 0 refills | Status: DC

## 2016-11-03 NOTE — Telephone Encounter (Signed)
Pt was notified that rx was sent to [facility].

## 2016-11-03 NOTE — Telephone Encounter (Signed)
Medication Refill requested for :metformin  Ph[facility] -[facility] supply  Return Contact : [phone]

## 2016-11-24 ENCOUNTER — Telehealth: Payer: Self-pay | Admitting: *Deleted

## 2016-11-24 MED ORDER — ATORVASTATIN CALCIUM 80 MG PO TABS: 80.0000 mg | ORAL_TABLET | Freq: Every day | ORAL | 0 refills | Status: DC

## 2016-11-24 NOTE — Telephone Encounter (Signed)
Refilled. Pt has been notified.

## 2016-11-24 NOTE — Telephone Encounter (Signed)
Medication Refill requested for : atorvastatin -10 pills only, [facility], this may take a week, pt is requested a partial refill.  Pharmacy:[facility]  Return Contact : [phone]

## 2016-12-10 ENCOUNTER — Ambulatory Visit: Payer: Medicare Other

## 2016-12-24 ENCOUNTER — Ambulatory Visit: Payer: Medicare Other

## 2016-12-28 ENCOUNTER — Ambulatory Visit (INDEPENDENT_AMBULATORY_CARE_PROVIDER_SITE_OTHER): Payer: Medicare Other

## 2016-12-28 VITALS — BP 130/70 | HR 79 | Temp 98.8°F | Resp 14 | Ht 68.0 in | Wt 204.4 lb

## 2016-12-28 DIAGNOSIS — Z0000 Encounter for general adult medical examination without abnormal findings: Secondary | ICD-10-CM

## 2016-12-28 NOTE — Progress Notes (Signed)
Care was provided under my supervision. I agree with the management as indicated in the note.  Trei Schoch DO  

## 2016-12-28 NOTE — Patient Instructions (Addendum)
  [name] , Thank you for taking time to come for your Medicare Wellness Visit. I appreciate your ongoing commitment to your health goals. Please review the following plan we discussed and let me know if I can assist you in the future.   Follow up with Dr. Derrel Nip as needed.    Bring a copy of your [place] and/or Living Will to be scanned into chart once completed.  Have a great day!  These are the goals we discussed: Goals    . Healthy Lifestyle          Low carb foods Stay active and continue exercise regimen Stay hydrated       This is a list of the screening recommended for you and due dates:  Health Maintenance  Topic Date Due  . Flu Shot  01/26/2017  . Eye exam for diabetics  02/16/2017  . Hemoglobin A1C  03/03/2017  . Complete foot exam   05/28/2017  . Pneumonia vaccines (2 of 2 - PPSV23) 05/09/2019  . Colon Cancer Screening  06/26/2024  . Tetanus Vaccine  08/07/2024  .  Hepatitis C: One time screening is recommended by Center for Disease Control  (CDC) for  adults born from [birth year] through 1965.   Completed

## 2016-12-28 NOTE — Progress Notes (Signed)
Subjective:   Robert Engen. is a 66 y.o. male who presents for an Initial Medicare Annual Wellness Visit.  Review of Systems  No ROS.  Medicare Wellness Visit. Additional risk factors are reflected in the social history.  Cardiac Risk Factors include: advanced age (>9mn, >>29women);hypertension;diabetes mellitus;obesity (BMI >30kg/m2)    Objective:    Today's Vitals   12/28/16 0919  BP: 130/70  Pulse: 79  Resp: 14  Temp: 98.8 F (37.1 C)  TempSrc: Oral  SpO2: 95%  Weight: 204 lb 6.4 oz (92.7 kg)  Height: 5' 8"  (1.727 m)   Body mass index is 31.08 kg/m.  Current Medications (verified) Outpatient Encounter Prescriptions as of 12/28/2016  Medication Sig  . aspirin 81 MG tablet Take 81 mg by mouth daily.  .Marland Kitchenatorvastatin (LIPITOR) 80 MG tablet Take 1 tablet (80 mg total) by mouth daily.  . Blood Glucose Monitoring Suppl (FREESTYLE FREEDOM LITE) w/Device KIT 1 application by Does not apply route 2 (two) times daily.  .Marland Kitchenglucose blood (FREESTYLE LITE) test strip CHECK SUGARS TWICE A DAY  . Krill Oil 300 MG CAPS Take 1 capsule by mouth daily.  . Lancets (FREESTYLE) lancets Check sugar bid  . lisinopril (PRINIVIL,ZESTRIL) 10 MG tablet TAKE 1 TABLET DAILY  . metFORMIN (GLUCOPHAGE-XR) 500 MG 24 hr tablet Take 1 tablet (500 mg total) by mouth daily with breakfast.  . Multiple Vitamins-Minerals (CENTRUM SILVER ADULT 50+ PO) Take 1 tablet by mouth daily.  . Cinnamon 500 MG capsule Take by mouth.  . RA KRILL OIL 500 MG CAPS Take by mouth.   No facility-administered encounter medications on file as of 12/28/2016.     Allergies (verified) Pollen extract   History: Past Medical History:  Diagnosis Date  . Allergy   . Colon polyp   . Diabetes mellitus   . Hyperlipidemia   . Hypertension    Past Surgical History:  Procedure Laterality Date  . COLONOSCOPY  2010   10 mm adenomatous polyp in the descending colon without atypia, DLucilla Lame MD   Family History    Problem Relation Age of Onset  . Diabetes Mother   . Stroke Mother   . Diabetes Brother   . Diabetes Maternal Grandfather   . Diabetes Maternal Grandmother   . Cancer Sister        brain tumor   Social History   Occupational History  . Not on file.   Social History Main Topics  . Smoking status: Former Smoker    Packs/day: 1.00    Years: 30.00    Types: Cigarettes  . Smokeless tobacco: Never Used  . Alcohol use 0.6 oz/week    1 Glasses of wine per week     Comment: daily  . Drug use: No  . Sexual activity: Not on file   Tobacco Counseling Counseling given: Not Answered   Activities of Daily Living In your present state of health, do you have any difficulty performing the following activities: 12/28/2016  Hearing? N  Vision? N  Difficulty concentrating or making decisions? N  Walking or climbing stairs? N  Dressing or bathing? N  Doing errands, shopping? N  Preparing Food and eating ? N  Using the Toilet? N  In the past six months, have you accidently leaked urine? N  Do you have problems with loss of bowel control? N  Managing your Medications? N  Managing your Finances? N  Housekeeping or managing your Housekeeping? N  Some recent data might  be hidden    Immunizations and Health Maintenance Immunization History  Administered Date(s) Administered  . Hep A / Hep B 05/07/2015, 06/10/2015, 11/04/2015  . Influenza Split 04/28/2013  . Influenza, High Dose Seasonal PF 02/27/2016  . Influenza,inj,Quad PF,36+ Mos 05/08/2014, 05/07/2015  . Pneumococcal Conjugate-13 08/15/2015  . Pneumococcal Polysaccharide-23 05/08/2014  . Tdap 08/07/2014  . Zoster 02/27/2016   There are no preventive care reminders to display for this patient.  Patient Care Team: [name], MD as PCP - General (Internal Medicine) [name], MD (Internal Medicine) Bary Castilla Forest Gleason, MD (General Surgery)  Indicate any recent Medical Services you may have received from other than  [facility] providers in the past year (date may be approximate).    Assessment:   This is a routine wellness examination for Robert Robertson. The goal of the wellness visit is to assist the patient how to close the gaps in care and create a preventative care plan for the patient.   The roster of all physicians providing medical care to patient is listed in the Snapshot section of the chart.  Osteoporosis risk reviewed.    Safety issues reviewed; Smoke and carbon monoxide detectors in the home. No firearms in the home.  Firearms locked up in the home. Wears seatbelts when driving or riding with others. Patient does wear sunscreen or protective clothing when in direct sunlight. No violence in the home.  Patient is alert, normal appearance, oriented to person/place/and time.  Correctly identified the president of the [place], recall of 3/3 words, and performing simple calculations. Displays appropriate judgement and can read correct time from watch face.   No new identified risk were noted.  No failures at ADL's or IADL's.   BMI- discussed the importance of a healthy diet, water intake and the benefits of aerobic exercise. Educational material provided.   Daily fluid intake: 2 cups of caffeine, 4 cups of water  Dental- every 12  months.  Dr. Gerlene Burdock and Gerlene Burdock [facility])  Sleep patterns- Sleeps 6-7 hours at night.  Wakes feeling rested.  Health maintenance gaps- closed.  Patient Concerns: None at this time. Follow up with PCP as needed.  Hearing/Vision screen Hearing Screening Comments: Patient is able to hear conversational tones without difficulty.  No issues reported.   Vision Screening Comments: Followed by [facility] Wears corrective lenses Last OV 2017 Visual acuity not assessed per patient preference since they have regular follow up with the ophthalmologist  Dietary issues and exercise activities discussed: Current Exercise Habits: Home exercise routine, Type of exercise:  walking;strength training/weights (walks 1 mile 3 days weekly), Time (Minutes): 50, Frequency (Times/Week): 3, Weekly Exercise (Minutes/Week): 150, Intensity: Moderate  Goals    . Healthy Lifestyle          Low carb foods Stay active and continue exercise regimen Stay hydrated      Depression Screen PHQ 2/9 Scores 12/28/2016 12/15/2015 12/12/2015  PHQ - 2 Score 0 0 0    Fall Risk Fall Risk  12/28/2016 12/15/2015 12/12/2015  Falls in the past year? No No No    Cognitive Function: MMSE - Mini Mental State Exam 12/28/2016  Orientation to time 5  Orientation to Place 5  Registration 3  Attention/ Calculation 5  Recall 3  Language- name 2 objects 2  Language- repeat 1  Language- follow 3 step command 3  Language- read & follow direction 1  Write a sentence 1  Copy design 1  Total score 30  Screening Tests Health Maintenance  Topic Date Due  . INFLUENZA VACCINE  01/26/2017  . OPHTHALMOLOGY EXAM  02/16/2017  . HEMOGLOBIN A1C  03/03/2017  . FOOT EXAM  05/28/2017  . PNA vac Low Risk Adult (2 of 2 - PPSV23) 05/09/2019  . COLONOSCOPY  06/26/2024  . TETANUS/TDAP  08/07/2024  . Hepatitis C Screening  Completed        Plan:   End of life planning; Advanced aging; Advanced directives discussed.  No HCPOA/Living Will.  Additional information provided to help them start the conversation with family.  Copy of HCPOA/Living Will requested upon completion. Time spent on this topic is 16 minutes.  I have personally reviewed and noted the following in the patient's chart:   . Medical and social history . Use of alcohol, tobacco or illicit drugs  . Current medications and supplements . Functional ability and status . Nutritional status . Physical activity . Advanced directives . List of other physicians . Hospitalizations, surgeries, and ER visits in previous 12 months . Vitals . Screenings to include cognitive, depression, and falls . Referrals and appointments  In  addition, I have reviewed and discussed with patient certain preventive protocols, quality metrics, and best practice recommendations. A written personalized care plan for preventive services as well as general preventive health recommendations were provided to patient.     Varney Biles, LPN   08/06/8335

## 2017-02-08 ENCOUNTER — Other Ambulatory Visit: Payer: Self-pay | Admitting: Internal Medicine

## 2017-02-25 DIAGNOSIS — K760 Fatty (change of) liver, not elsewhere classified: Secondary | ICD-10-CM | POA: Diagnosis not present

## 2017-03-02 ENCOUNTER — Other Ambulatory Visit: Payer: Self-pay | Admitting: Nurse Practitioner

## 2017-03-02 DIAGNOSIS — K7581 Nonalcoholic steatohepatitis (NASH): Secondary | ICD-10-CM

## 2017-03-11 ENCOUNTER — Ambulatory Visit
Admission: RE | Admit: 2017-03-11 | Discharge: 2017-03-11 | Disposition: A | Discharge status: Home / Self Care | Payer: Medicare Other | Source: Ambulatory Visit | Attending: Nurse Practitioner | Admitting: Nurse Practitioner

## 2017-03-11 DIAGNOSIS — R760 Raised antibody titer: Secondary | ICD-10-CM | POA: Diagnosis not present

## 2017-03-11 DIAGNOSIS — K7581 Nonalcoholic steatohepatitis (NASH): Secondary | ICD-10-CM

## 2017-03-27 ENCOUNTER — Other Ambulatory Visit: Payer: Self-pay | Admitting: Internal Medicine

## 2017-03-28 NOTE — Telephone Encounter (Signed)
Patient had appt with Lacinda Axon on 9/19, has upcoming with Pharmacist in October, please advise as I know his meds have changed with being in the hospital.  thanks

## 2017-05-23 ENCOUNTER — Other Ambulatory Visit: Payer: Self-pay | Admitting: Internal Medicine

## 2017-05-23 ENCOUNTER — Other Ambulatory Visit: Payer: Self-pay | Admitting: Family Medicine

## 2017-05-25 IMAGING — US US ABDOMEN COMPLETE W/ ELASTOGRAPHY
1 series · 13 of 25 positions shown · non-contrast
Comparison: [DATE]

CLINICAL DATA: [place]



[Series 1: us abdomen complete w/ elastography · 0.22mm/px · 13 of 82 slices shown]
[im 1/82]
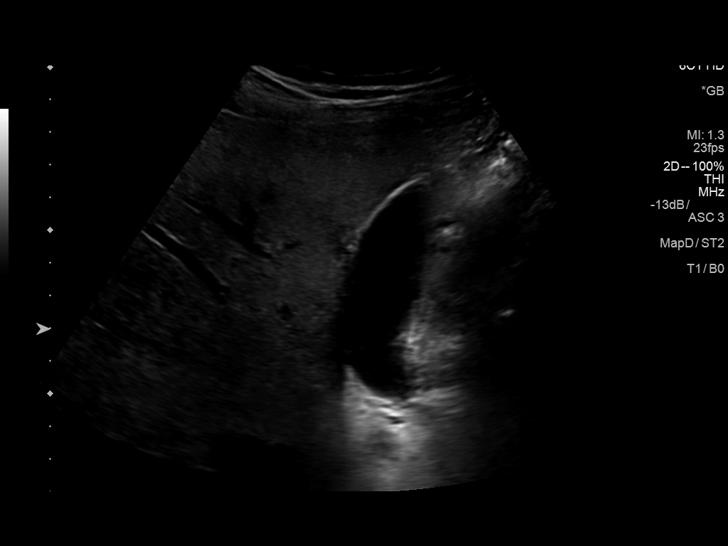
[im 7/82]
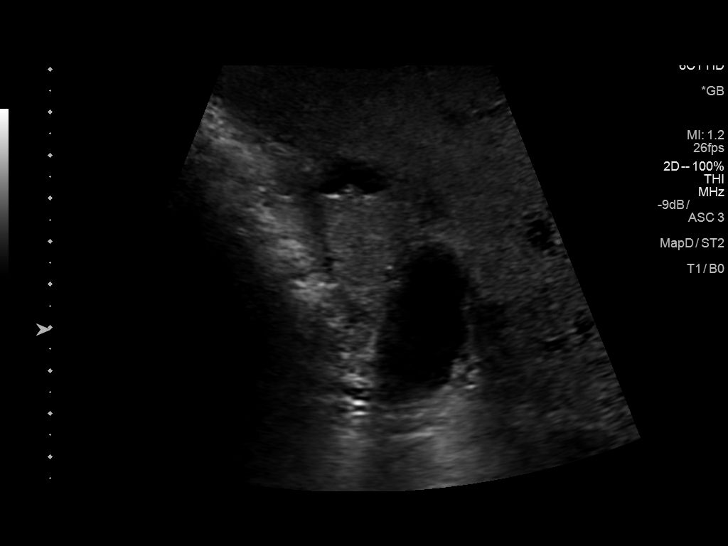
[im 14/82]
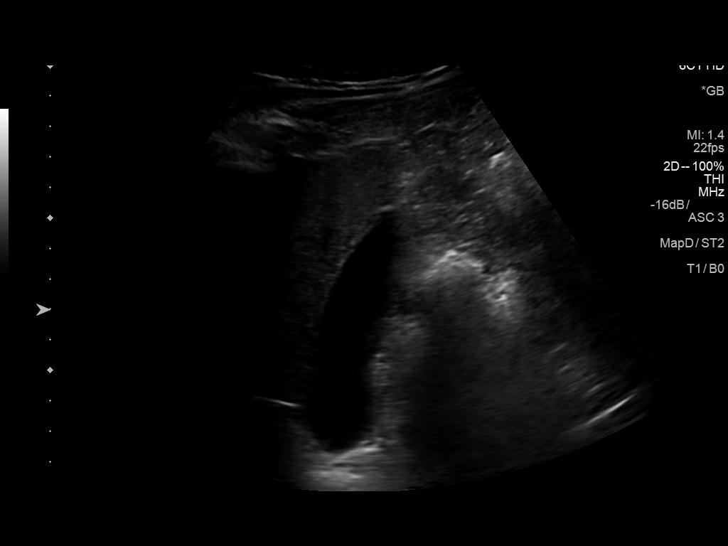
[im 21/82]
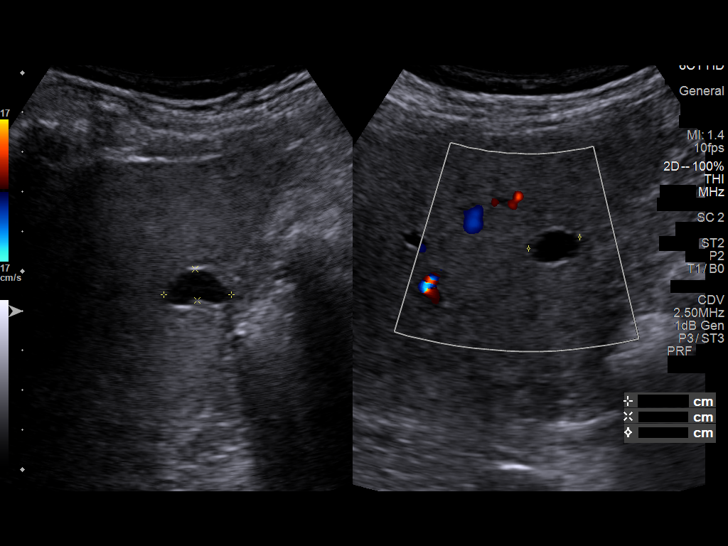
[im 28/82]
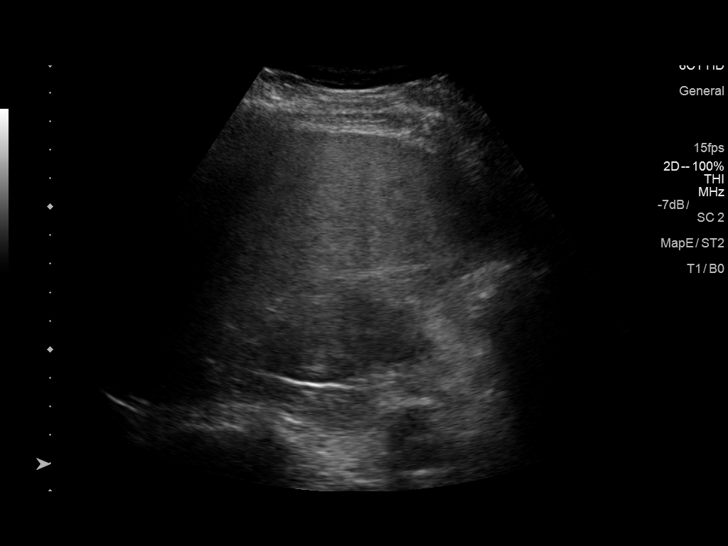
[im 34/82]
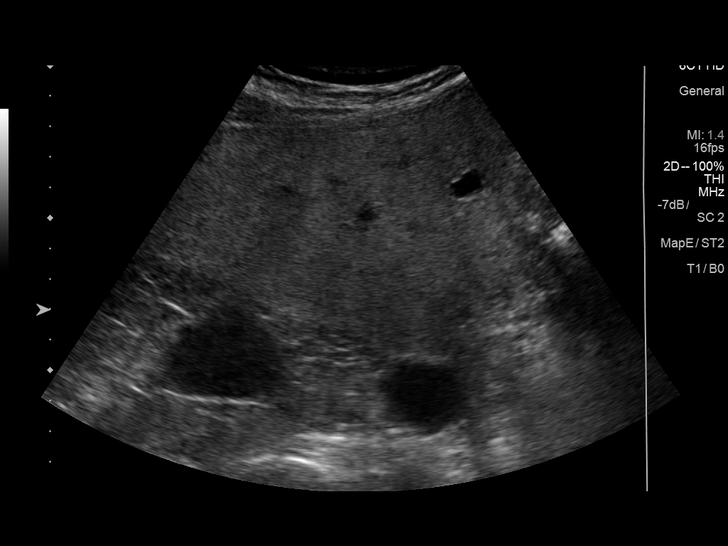
[im 41/82]
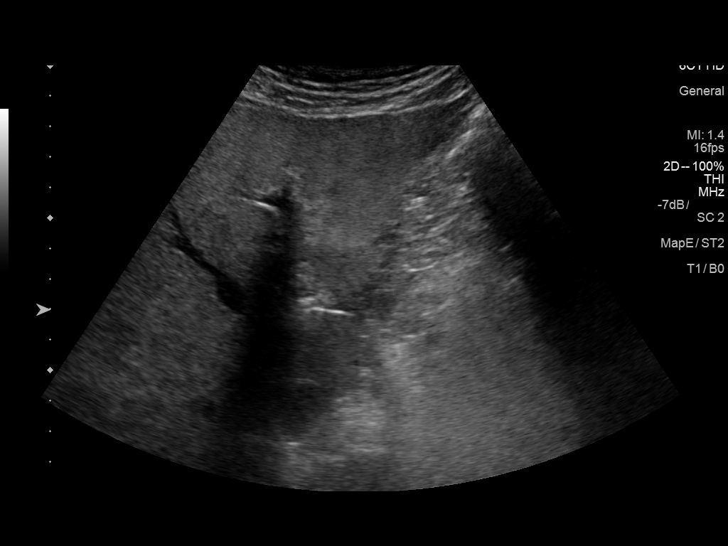
[im 48/82]
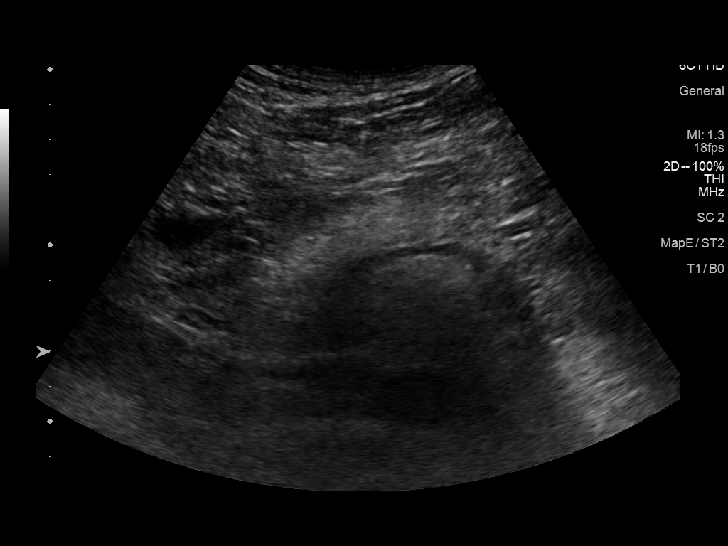
[im 55/82]
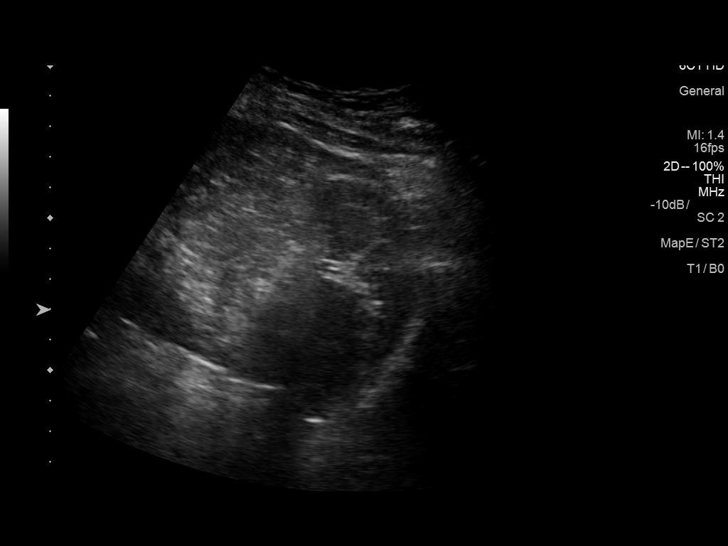
[im 61/82]
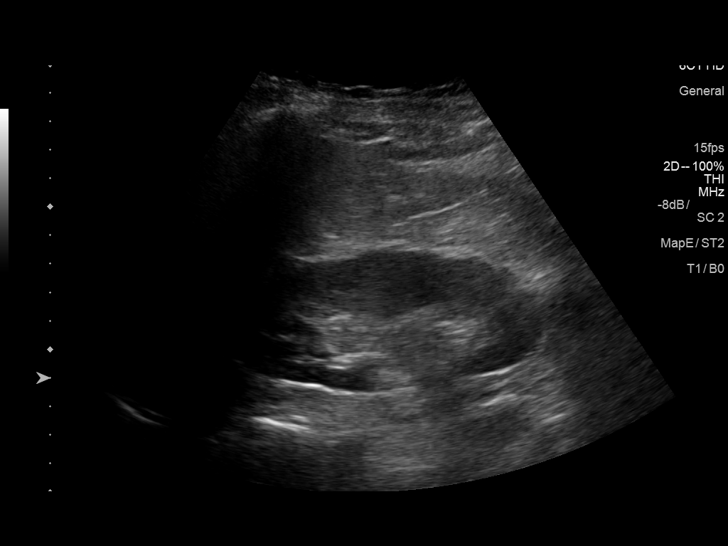
[im 68/82]
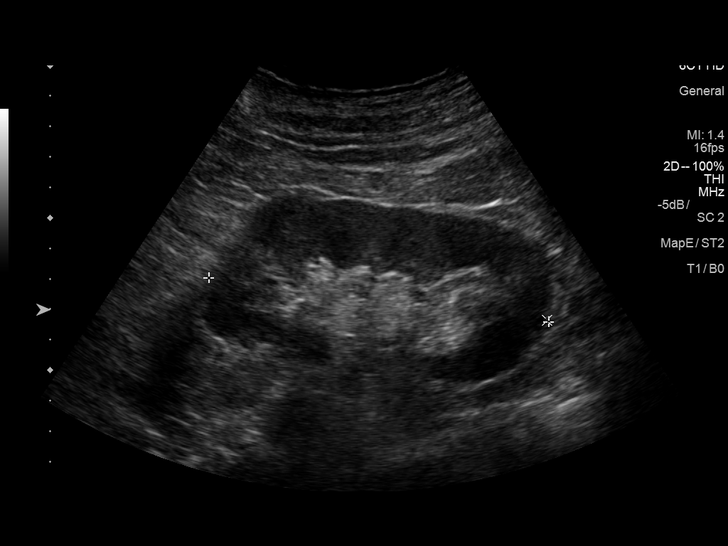
[im 75/82]
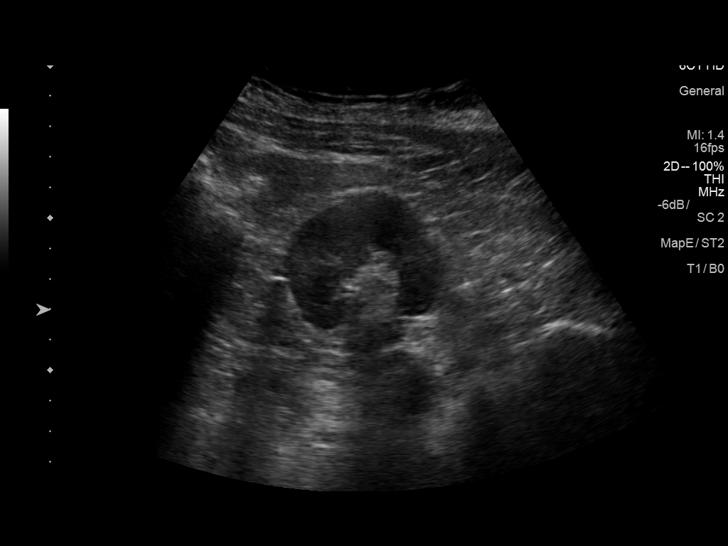
[im 82/82]
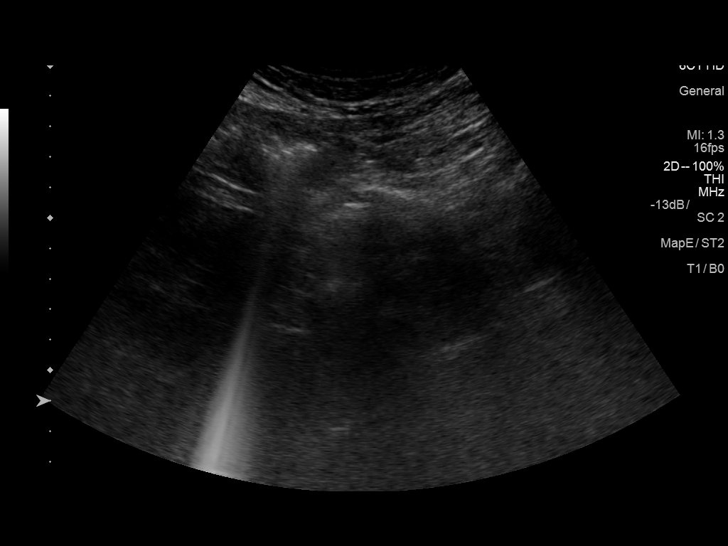

[13 of 25 positions shown; findings below may reference images not displayed]

FINDINGS: ULTRASOUND ABDOMEN

Gallbladder: No gallstones or wall thickening visualized. No
sonographic Murphy sign noted by sonographer.

Common bile duct: Diameter: Normal caliber, 4 mm

Liver: Heterogeneous, increased echotexture throughout the liver
compatible with fatty infiltration or intrinsic liver disease.
cm right hepatic cyst which appears benign. No suspicious focal bony
scratched at no suspicious focal abnormality. Portal vein is patent
on color Doppler imaging with normal direction of blood flow towards
the liver.

IVC: No abnormality visualized.

Pancreas: Visualized portion unremarkable.

Spleen: Size and appearance within normal limits.

Right Kidney: Length: 12.6 cm. Echogenicity within normal limits. No
mass or hydronephrosis visualized.

Left Kidney: Length: 11.8 cm. Echogenicity within normal limits. No
mass or hydronephrosis visualized.

Abdominal aorta: No aneurysm visualized.

Other findings: None.

ULTRASOUND HEPATIC ELASTOGRAPHY

Device: Siemens Helix VTQ

Patient position: Oblique

Transducer 6C1

Number of measurements: 10

Hepatic segment:  8

Median velocity:   0.92  m/sec

IQR:

IQR/Median velocity ratio:

Corresponding Metavir fibrosis score:  F0/F1

Risk of fibrosis: Minimal

Limitations of exam: None

Pertinent findings noted on other imaging exams:  None

Please note that abnormal shear wave velocities may also be
identified in clinical settings other than with hepatic fibrosis,
such as: acute hepatitis, elevated right heart and central venous
pressures including use of beta blockers, NOWICKI disease
(NOWICKI), infiltrative processes such as
mastocytosis/amyloidosis/infiltrative tumor, extrahepatic
cholestasis, in the post-prandial state, and liver transplantation.
Correlation with patient history, laboratory data, and clinical
condition recommended.
IMPRESSION: ULTRASOUND ABDOMEN:
Increased, heterogeneous echotexture throughout the liver compatible
with fatty infiltration or intrinsic liver disease.

ULTRASOUND HEPATIC ELASTOGRAPHY:

Median hepatic shear wave velocity is calculated at 0.92 m/sec.

Corresponding Metavir fibrosis score is  F0/F1.

Risk of fibrosis is Minimal.

Follow-up: None required

## 2017-07-07 ENCOUNTER — Encounter: Payer: Self-pay | Admitting: Internal Medicine

## 2017-07-07 ENCOUNTER — Ambulatory Visit (INDEPENDENT_AMBULATORY_CARE_PROVIDER_SITE_OTHER): Payer: Medicare Other | Admitting: Internal Medicine

## 2017-07-07 VITALS — BP 130/82 | HR 99 | Temp 98.7°F | Resp 15 | Ht 68.0 in | Wt 206.0 lb

## 2017-07-07 DIAGNOSIS — E669 Obesity, unspecified: Secondary | ICD-10-CM

## 2017-07-07 DIAGNOSIS — Z6372 Alcoholism and drug addiction in family: Secondary | ICD-10-CM

## 2017-07-07 DIAGNOSIS — E1169 Type 2 diabetes mellitus with other specified complication: Secondary | ICD-10-CM

## 2017-07-07 DIAGNOSIS — I10 Essential (primary) hypertension: Secondary | ICD-10-CM

## 2017-07-07 DIAGNOSIS — Z638 Other specified problems related to primary support group: Secondary | ICD-10-CM

## 2017-07-07 DIAGNOSIS — Z23 Encounter for immunization: Secondary | ICD-10-CM

## 2017-07-07 DIAGNOSIS — E1121 Type 2 diabetes mellitus with diabetic nephropathy: Secondary | ICD-10-CM | POA: Diagnosis not present

## 2017-07-07 DIAGNOSIS — L989 Disorder of the skin and subcutaneous tissue, unspecified: Secondary | ICD-10-CM | POA: Diagnosis not present

## 2017-07-07 LAB — COMPREHENSIVE METABOLIC PANEL
ALBUMIN: 4.4 g/dL (ref 3.5–5.2)
ALK PHOS: 71 U/L (ref 39–117)
ALT: 43 U/L (ref 0–53)
AST: 28 U/L (ref 0–37)
BUN: 12 mg/dL (ref 6–23)
CALCIUM: 9.4 mg/dL (ref 8.4–10.5)
CO2: 29 mEq/L (ref 19–32)
Chloride: 102 mEq/L (ref 96–112)
Creatinine, Ser: 0.92 mg/dL (ref 0.40–1.50)
GFR: 105.65 mL/min (ref >60.00)
Glucose, Bld: 176 mg/dL — ABNORMAL HIGH (ref 70–99)
Potassium: 4.3 mEq/L (ref 3.5–5.1)
SODIUM: 137 meq/L (ref 135–145)
TOTAL PROTEIN: 7.4 g/dL (ref 6.0–8.3)
Total Bilirubin: 0.8 mg/dL (ref 0.2–1.2)

## 2017-07-07 LAB — MICROALBUMIN / CREATININE URINE RATIO
Creatinine,U: 138.8 mg/dL
MICROALB UR: 2.1 mg/dL — AB (ref 0.0–1.9)
Microalb Creat Ratio: 1.5 mg/g (ref 0.0–30.0)

## 2017-07-07 LAB — HEMOGLOBIN A1C: Hgb A1c MFr Bld: 8.2 % — ABNORMAL HIGH (ref 4.6–6.5)

## 2017-07-07 MED ORDER — ZOSTER VAC RECOMB ADJUVANTED 50 MCG/0.5ML IM SUSR: 0.5000 mL | Freq: Once | INTRAMUSCULAR | 1 refills | Status: AC

## 2017-07-07 NOTE — Patient Instructions (Addendum)
Omran bloood pressure machiens are th emost accurate fo rhe money in consumer division   Goal  For  BP is not below 120/70    Check with [facility] about pirior vaccinations  You nay have received there..  If not return here for Pneumovax vaccine in the next few months    The ShingRx vaccine is now available in local pharmacies and is much more protective thant Zostavaxs,  It is therefore ADVISED for all interested adults over 50 to prevent shingles

## 2017-07-07 NOTE — Progress Notes (Signed)
Subjective:  Patient ID: Robert Lat., male    DOB: 07-20-1950  Age: 67 y.o. MRN: [id number]  CC: The primary encounter diagnosis was Diabetes mellitus type 2 in obese ([place]). Diagnoses of Encounter for immunization, Skin lesion, Essential hypertension, Spouse of alcoholic, Diabetic nephropathy associated with type 2 diabetes mellitus ([place]), and Facial lesion were also pertinent to this visit.  HPI Robert Robertson. presents for 6 month follow up on diabetes.  Patient has no complaints today.  Patient is following a low glycemic index diet and taking all prescribed medications regularly without side effects.  Fasting sugars have been averaging 135.  Patient is exercising about 3 times per week and intentionally trying to lose weight .  Patient has had an eye exam in the last 12 months and checks feet regularly for signs of infection.  Patient does not walk barefoot outside,  And denies an numbness tingling or burning in feet. Patient is up to date on all recommended vaccinations  Wants dermatolgy referral for evaluation of several facial skin lesions  Wife Robert Robertson refuses to return for follow up care due to a fixed delusion about something in her chart related to her mother's health history .  She is no longer abusing alcohol due to progression of her dementia.  Discussed the need for her to have medical follow up with ANY provider,  With transference to Robert Robertson location if there is a provider who will see her.   Lab Results  Component Value Date   HGBA1C 8.2 (H) 07/07/2017   Lab Results  Component Value Date   MICROALBUR 2.1 (H) 07/07/2017   Lab Results  Component Value Date   CHOL 126 08/31/2016   HDL 50.60 08/31/2016   LDLCALC 62 08/31/2016   LDLDIRECT 59.0 08/31/2016   TRIG 67.0 08/31/2016   CHOLHDL 2 08/31/2016     Outpatient Medications Prior to Visit  Medication Sig Dispense Refill  . aspirin 81 MG tablet Take 81 mg by mouth daily.    [name] atorvastatin  (LIPITOR) 80 MG tablet TAKE 1 TABLET DAILY 90 tablet 0  . Blood Glucose Monitoring Suppl (FREESTYLE FREEDOM LITE) w/Device KIT 1 application by Does not apply route 2 (two) times daily. 1 each 0  . Cinnamon 500 MG capsule Take by mouth.    [name] glucose blood (FREESTYLE LITE) test strip CHECK SUGARS TWICE A DAY 200 each 1  . Krill Oil 300 MG CAPS Take 1 capsule by mouth daily.    . Lancets (FREESTYLE) lancets CHECK SUGAR TWICE A DAY 200 each 0  . metFORMIN (GLUCOPHAGE-XR) 500 MG 24 hr tablet TAKE 1 TABLET DAILY WITH BREAKFAST 90 tablet 0  . Multiple Vitamins-Minerals (CENTRUM SILVER ADULT 50+ PO) Take 1 tablet by mouth daily.    [name] RA KRILL OIL 500 MG CAPS Take by mouth.    [name] lisinopril (PRINIVIL,ZESTRIL) 10 MG tablet TAKE 1 TABLET DAILY 90 tablet 1   No facility-administered medications prior to visit.     Review of Systems;  Patient denies headache, fevers, malaise, unintentional weight loss, skin rash, eye pain, sinus congestion and sinus pain, sore throat, dysphagia,  hemoptysis , cough, dyspnea, wheezing, chest pain, palpitations, orthopnea, edema, abdominal pain, nausea, melena, diarrhea, constipation, flank pain, dysuria, hematuria, urinary  Frequency, nocturia, numbness, tingling, seizures,  Focal weakness, Loss of consciousness,  Tremor, insomnia, depression, anxiety, and suicidal ideation.      Objective:  BP 130/82 (BP Location: Left Arm, Patient Position: Sitting, Cuff Size:  Normal)   Pulse 99   Temp 98.7 F (37.1 C) (Oral)   Resp 15   Ht 5' 8"  (1.727 m)   Wt 206 lb (93.4 kg)   SpO2 97%   BMI 31.32 kg/m   BP Readings from Last 3 Encounters:  07/07/17 130/82  12/28/16 130/70  09/03/16 128/72    Wt Readings from Last 3 Encounters:  07/07/17 206 lb (93.4 kg)  12/28/16 204 lb 6.4 oz (92.7 kg)  09/03/16 202 lb (91.6 kg)    General appearance: alert, cooperative and appears stated age Ears: normal TM's and external ear canals both ears Throat: lips, mucosa, and tongue  normal; teeth and gums normal Neck: no adenopathy, no carotid bruit, supple, symmetrical, trachea midline and thyroid not enlarged, symmetric, no tenderness/mass/nodules Back: symmetric, no curvature. ROM normal. No CVA tenderness. Lungs: clear to auscultation bilaterally Heart: regular rate and rhythm, S1, S2 normal, no murmur, click, rub or gallop Abdomen: soft, non-tender; bowel sounds normal; no masses,  no organomegaly Pulses: 2+ and symmetric Skin: Skin color, texture, turgor normal. No rashes or lesions Lymph nodes: Cervical, supraclavicular, and axillary nodes normal.  Lab Results  Component Value Date   HGBA1C 8.2 (H) 07/07/2017   HGBA1C 7.3 (H) 08/31/2016   HGBA1C 7.2 (H) 05/25/2016    Lab Results  Component Value Date   CREATININE 0.92 07/07/2017   CREATININE 0.85 08/31/2016   CREATININE 0.86 05/25/2016    Lab Results  Component Value Date   WBC 6.2 12/12/2015   HGB 14.6 12/12/2015   HCT 41.7 (A) 12/12/2015   PLT 154 02/28/2012   GLUCOSE 176 (H) 07/07/2017   CHOL 126 08/31/2016   TRIG 67.0 08/31/2016   HDL 50.60 08/31/2016   LDLDIRECT 59.0 08/31/2016   LDLCALC 62 08/31/2016   ALT 43 07/07/2017   AST 28 07/07/2017   NA 137 07/07/2017   K 4.3 07/07/2017   CL 102 07/07/2017   CREATININE 0.92 07/07/2017   BUN 12 07/07/2017   CO2 29 07/07/2017   PSA 2.00 05/14/2014   INR 0.99 02/29/2012   HGBA1C 8.2 (H) 07/07/2017   MICROALBUR 2.1 (H) 07/07/2017    US Abdomen Complete W/elastography  Result Date: 03/11/2017 CLINICAL DATA:  Robert Robertson EXAM: ULTRASOUND ABDOMEN ULTRASOUND HEPATIC ELASTOGRAPHY TECHNIQUE: Sonography of the upper abdomen was performed. In addition, ultrasound elastography evaluation of the liver was performed. A region of interest was placed within the right lobe of the liver. Following application of a compressive sonographic pulse, shear waves were detected in the adjacent hepatic tissue and the shear wave velocity was calculated. Multiple assessments  were performed at the selected site. Median shear wave velocity is correlated to a Metavir fibrosis score. COMPARISON:  12/05/2015 FINDINGS: ULTRASOUND ABDOMEN Gallbladder: No gallstones or wall thickening visualized. No sonographic Murphy sign noted by sonographer. Common bile duct: Diameter: Normal caliber, 4 mm Liver: Heterogeneous, increased echotexture throughout the liver compatible with fatty infiltration or intrinsic liver disease. 1.7 cm right hepatic cyst which appears benign. No suspicious focal bony scratched at no suspicious focal abnormality. Portal vein is patent on color Doppler imaging with normal direction of blood flow towards the liver. IVC: No abnormality visualized. Pancreas: Visualized portion unremarkable. Spleen: Size and appearance within normal limits. Right Kidney: Length: 12.6 cm. Echogenicity within normal limits. No mass or hydronephrosis visualized. Left Kidney: Length: 11.8 cm. Echogenicity within normal limits. No mass or hydronephrosis visualized. Abdominal aorta: No aneurysm visualized. Other findings: None. ULTRASOUND HEPATIC ELASTOGRAPHY Device: Siemens Helix VTQ Patient position:  Oblique Transducer 6C1 Number of measurements: 10 Hepatic segment:  8 Median velocity:   0.92  m/sec IQR: 0.17 IQR/Median velocity ratio: 0.18 Corresponding Metavir fibrosis score:  F0/F1 Risk of fibrosis: Minimal Limitations of exam: None Pertinent findings noted on other imaging exams:  None Please note that abnormal shear wave velocities may also be identified in clinical settings other than with hepatic fibrosis, such as: acute hepatitis, elevated right heart and central venous pressures including use of beta blockers, veno-occlusive disease ([place]udd[place]Chiari), infiltrative processes such as mastocytosis/amyloidosis/infiltrative tumor, extrahepatic cholestasis, in the post-prandial state, and liver transplantation. Correlation with patient history, laboratory data, and clinical condition recommended.  IMPRESSION: ULTRASOUND ABDOMEN: Increased, heterogeneous echotexture throughout the liver compatible with fatty infiltration or intrinsic liver disease. ULTRASOUND HEPATIC ELASTOGRAPHY: Median hepatic shear wave velocity is calculated at 0.92 m/sec. Corresponding Metavir fibrosis score is  F0/F1. Risk of fibrosis is Minimal. Follow-up: None required Electronically Signed   By: Rolm Baptise M.D.   On: 03/11/2017 10:14    Assessment & Plan:   Problem List Items Addressed This Visit    Diabetic nephropathy associated with type 2 diabetes mellitus ([place])    Minimal microalbuminura noted today.  Will increase lisinopril to 20 mg daily       Relevant Medications   lisinopril (PRINIVIL,ZESTRIL) 20 MG tablet   sitaGLIPtin (JANUVIA) 50 MG tablet   DM type 2, not at goal Robert Robertson) - Primary    Continued loss of control on metformin alone, and now with early microalbuminuria.   Will recommend adding Januvia, since Jardiance is not on formulary . increase dose of lisinopril to 20 mg daily, and continue  Asa and atorvastatin   Lab Results  Component Value Date   HGBA1C 8.2 (H) 07/07/2017   Lab Results  Component Value Date   MICROALBUR 2.1 (H) 07/07/2017   Lab Results  Component Value Date   LDLCALC 62 08/31/2016         Relevant Medications   lisinopril (PRINIVIL,ZESTRIL) 20 MG tablet   sitaGLIPtin (JANUVIA) 50 MG tablet   Essential hypertension    Well controlled on current regimen. Renal function stable, no changes today.  Lab Results  Component Value Date   CREATININE 0.92 07/07/2017   Lab Results  Component Value Date   NA 137 07/07/2017   K 4.3 07/07/2017   CL 102 07/07/2017   CO2 29 07/07/2017         Relevant Medications   lisinopril (PRINIVIL,ZESTRIL) 20 MG tablet   Facial lesion    Dermatology referral requested and in progress      Spouse of alcoholic    His wife is no longer actively drinking due to family intervention, but her dementia now includes delusional  thoughts which have resulted in her refusal to see me.  Agree with transfer of care to [facility] for [place] to mange her care.        Other Visit Diagnoses    Encounter for immunization       Relevant Orders   Flu vaccine HIGH DOSE PF (Completed)   Skin lesion       Relevant Orders   Ambulatory referral to Dermatology      I have changed Robert Raring Jr.'s lisinopril. I am also having him start on Zoster Vaccine Adjuvanted and sitaGLIPtin. Additionally, I am having him maintain his aspirin, Krill Oil, Multiple Vitamins-Minerals (CENTRUM SILVER ADULT 50+ PO), FREESTYLE FREEDOM LITE, glucose blood, Cinnamon, RA KRILL OIL, freestyle, atorvastatin, and metFORMIN.  Meds  ordered this encounter  Medications  . Zoster Vaccine Adjuvanted [facility]) injection    Sig: Inject 0.5 mLs into the muscle once for 1 dose.    Dispense:  1 each    Refill:  1  . lisinopril (PRINIVIL,ZESTRIL) 20 MG tablet    Sig: Take 1 tablet (20 mg total) by mouth daily.    Dispense:  90 tablet    Refill:  1    NOTE DOSE INCREASE  . sitaGLIPtin (JANUVIA) 50 MG tablet    Sig: Take 1 tablet (50 mg total) by mouth daily.    Dispense:  90 tablet    Refill:  0    Medications Discontinued During This Encounter  Medication Reason  . lisinopril (PRINIVIL,ZESTRIL) 10 MG tablet     Follow-up: No Follow-up on file.   [name], MD

## 2017-07-10 DIAGNOSIS — L989 Disorder of the skin and subcutaneous tissue, unspecified: Secondary | ICD-10-CM | POA: Insufficient documentation

## 2017-07-10 DIAGNOSIS — E1121 Type 2 diabetes mellitus with diabetic nephropathy: Secondary | ICD-10-CM | POA: Insufficient documentation

## 2017-07-10 MED ORDER — SITAGLIPTIN PHOSPHATE 50 MG PO TABS: 50.0000 mg | ORAL_TABLET | Freq: Every day | ORAL | 0 refills | Status: DC

## 2017-07-10 MED ORDER — LISINOPRIL 20 MG PO TABS: 20.0000 mg | ORAL_TABLET | Freq: Every day | ORAL | 1 refills | Status: DC

## 2017-07-10 NOTE — Assessment & Plan Note (Signed)
Well controlled on current regimen. Renal function stable, no changes today.  Lab Results  Component Value Date   CREATININE 0.92 07/07/2017   Lab Results  Component Value Date   NA 137 07/07/2017   K 4.3 07/07/2017   CL 102 07/07/2017   CO2 29 07/07/2017

## 2017-07-10 NOTE — Assessment & Plan Note (Addendum)
Continued loss of control on metformin alone, and now with early microalbuminuria.   Will recommend adding Januvia, since Jardiance is not on formulary . increase dose of lisinopril to 20 mg daily, and continue  Asa and atorvastatin   Lab Results  Component Value Date   HGBA1C 8.2 (H) 07/07/2017   Lab Results  Component Value Date   MICROALBUR 2.1 (H) 07/07/2017   Lab Results  Component Value Date   LDLCALC 62 08/31/2016

## 2017-07-10 NOTE — Assessment & Plan Note (Signed)
Minimal microalbuminura noted today.  Will increase lisinopril to 20 mg daily

## 2017-07-10 NOTE — Assessment & Plan Note (Signed)
Dermatology referral requested and in progress

## 2017-07-10 NOTE — Assessment & Plan Note (Signed)
His wife is no longer actively drinking due to family intervention, but her dementia now includes delusional thoughts which have resulted in her refusal to see me.  Agree with transfer of care to [facility] for [place] to mange her care.

## 2017-08-05 ENCOUNTER — Ambulatory Visit (INDEPENDENT_AMBULATORY_CARE_PROVIDER_SITE_OTHER): Payer: Medicare Other | Admitting: Internal Medicine

## 2017-08-05 DIAGNOSIS — Z638 Other specified problems related to primary support group: Secondary | ICD-10-CM | POA: Diagnosis not present

## 2017-08-05 DIAGNOSIS — Z6372 Alcoholism and drug addiction in family: Secondary | ICD-10-CM

## 2017-08-05 NOTE — Progress Notes (Signed)
Subjective:  Patient ID: Robert Lat., male    DOB: Jul 31, 1950  Age: 67 y.o. MRN: [id number]  CC: The encounter diagnosis was Spouse of alcoholic.  HPI Robert Robertson. presents for increasing concern about his wife's declining mental status.  Patient's wife , Robert Robertson , has a history of alcohol abuse and under treated hypothyroidism vascular dementia and hypertension.  Per family she has stopped taking all of her medications  As of two  weeks ago.  Daughter Robert Robertson has been giving them to her daily .  Recently patient took the medications from daughter s room and they have not been seen since.  She was last seen in office in July and TSH was 52.   Husband and daughter are her today .Robert Robertson who has moved in to help Robert Robertson.  .    Patient's wife is  refusing to leave the house,  Has not seen her neurologist either. Has not showered or bathed since Jan 10.  will only eat one meal per day . She has lost considerable weight.  Has not fallen . Not walking except to go to rest room.   Daughter has noticed that [name] is mumbling a lot , and has been increasingly angry toward patient.  Not violent. Lots of emotional outbursts. Has threatened to call the police.  Not recognizing him several times per week.  Having visual hallucinations and delusions .     [name]    Outpatient Medications Prior to Visit  Medication Sig Dispense Refill  . aspirin 81 MG tablet Take 81 mg by mouth daily.    [name] atorvastatin (LIPITOR) 80 MG tablet TAKE 1 TABLET DAILY 90 tablet 0  . Blood Glucose Monitoring Suppl (FREESTYLE FREEDOM LITE) w/Device KIT 1 application by Does not apply route 2 (two) times daily. 1 each 0  . Cinnamon 500 MG capsule Take by mouth.    [name] glucose blood (FREESTYLE LITE) test strip CHECK SUGARS TWICE A DAY 200 each 1  . Krill Oil 300 MG CAPS Take 1 capsule by mouth daily.    . Lancets (FREESTYLE) lancets CHECK SUGAR TWICE A DAY 200 each 0  . lisinopril (PRINIVIL,ZESTRIL) 20 MG tablet  Take 1 tablet (20 mg total) by mouth daily. 90 tablet 1  . metFORMIN (GLUCOPHAGE-XR) 500 MG 24 hr tablet TAKE 1 TABLET DAILY WITH BREAKFAST 90 tablet 0  . Multiple Vitamins-Minerals (CENTRUM SILVER ADULT 50+ PO) Take 1 tablet by mouth daily.    [name] RA KRILL OIL 500 MG CAPS Take by mouth.    . sitaGLIPtin (JANUVIA) 50 MG tablet Take 1 tablet (50 mg total) by mouth daily. 90 tablet 0   No facility-administered medications prior to visit.     Review of Systems;  Patient denies headache, fevers, malaise, unintentional weight loss, skin rash, eye pain, sinus congestion and sinus pain, sore throat, dysphagia,  hemoptysis , cough, dyspnea, wheezing, chest pain, palpitations, orthopnea, edema, abdominal pain, nausea, melena, diarrhea, constipation, flank pain, dysuria, hematuria, urinary  Frequency, nocturia, numbness, tingling, seizures,  Focal weakness, Loss of consciousness,  Tremor, insomnia, depression, anxiety, and suicidal ideation.      Objective:  There were no vitals taken for this visit.   Assessment & Plan:   Problem List Items Addressed This Visit    Spouse of alcoholic    A total of 25 minutes of face to face time was spent with patient  and his daughter,  All of  which was spent in  counselling about the need to have his wife admitted through ER when she is agitated for evaluation and management of her dementia with delusions complicated by untreated hypothyroidisim .         I am having Robert Robertson. maintain his aspirin, Robert Robertson, Multiple Vitamins-Minerals (CENTRUM SILVER ADULT 50+ PO), FREESTYLE FREEDOM LITE, glucose blood, Cinnamon, RA KRILL OIL, freestyle, atorvastatin, metFORMIN, lisinopril, and sitaGLIPtin.  No orders of the defined types were placed in this encounter.   There are no discontinued medications.  Follow-up: No Follow-up on file.   [name], MD

## 2017-08-07 NOTE — Assessment & Plan Note (Signed)
A total of 25 minutes of face to face time was spent with patient  and his daughter,  All of  which was spent in counselling about the need to have his wife admitted through ER when she is agitated for evaluation and management of her dementia with delusions complicated by untreated hypothyroidisim .

## 2017-08-25 DIAGNOSIS — L821 Other seborrheic keratosis: Secondary | ICD-10-CM | POA: Diagnosis not present

## 2017-09-05 DIAGNOSIS — H40003 Preglaucoma, unspecified, bilateral: Secondary | ICD-10-CM | POA: Diagnosis not present

## 2017-09-05 DIAGNOSIS — E119 Type 2 diabetes mellitus without complications: Secondary | ICD-10-CM | POA: Diagnosis not present

## 2017-09-05 LAB — HM DIABETES EYE EXAM

## 2017-09-08 ENCOUNTER — Other Ambulatory Visit: Payer: Self-pay | Admitting: Internal Medicine

## 2017-10-05 ENCOUNTER — Other Ambulatory Visit (INDEPENDENT_AMBULATORY_CARE_PROVIDER_SITE_OTHER): Payer: Medicare Other

## 2017-10-05 DIAGNOSIS — E1169 Type 2 diabetes mellitus with other specified complication: Secondary | ICD-10-CM

## 2017-10-05 DIAGNOSIS — E669 Obesity, unspecified: Secondary | ICD-10-CM

## 2017-10-05 LAB — LIPID PANEL
CHOLESTEROL: 142 mg/dL (ref 0–200)
HDL: 50.3 mg/dL (ref >39.00)
LDL Cholesterol: 76 mg/dL (ref 0–99)
NonHDL: 91.88
TRIGLYCERIDES: 79 mg/dL (ref 0.0–149.0)
Total CHOL/HDL Ratio: 3
VLDL: 15.8 mg/dL (ref 0.0–40.0)

## 2017-10-05 LAB — COMPREHENSIVE METABOLIC PANEL
ALBUMIN: 4.4 g/dL (ref 3.5–5.2)
ALK PHOS: 62 U/L (ref 39–117)
ALT: 38 U/L (ref 0–53)
AST: 33 U/L (ref 0–37)
BILIRUBIN TOTAL: 1.1 mg/dL (ref 0.2–1.2)
BUN: 11 mg/dL (ref 6–23)
CALCIUM: 9.4 mg/dL (ref 8.4–10.5)
CO2: 28 meq/L (ref 19–32)
CREATININE: 0.91 mg/dL (ref 0.40–1.50)
Chloride: 103 mEq/L (ref 96–112)
GFR: 106.91 mL/min (ref >60.00)
Glucose, Bld: 137 mg/dL — ABNORMAL HIGH (ref 70–99)
Potassium: 4.2 mEq/L (ref 3.5–5.1)
Sodium: 138 mEq/L (ref 135–145)
Total Protein: 7.5 g/dL (ref 6.0–8.3)

## 2017-10-05 LAB — HEMOGLOBIN A1C: Hgb A1c MFr Bld: 7.5 % — ABNORMAL HIGH (ref 4.6–6.5)

## 2017-10-07 ENCOUNTER — Ambulatory Visit (INDEPENDENT_AMBULATORY_CARE_PROVIDER_SITE_OTHER): Payer: Medicare Other | Admitting: Internal Medicine

## 2017-10-07 ENCOUNTER — Other Ambulatory Visit: Payer: Self-pay

## 2017-10-07 ENCOUNTER — Encounter: Payer: Self-pay | Admitting: Internal Medicine

## 2017-10-07 DIAGNOSIS — E119 Type 2 diabetes mellitus without complications: Secondary | ICD-10-CM | POA: Diagnosis not present

## 2017-10-07 DIAGNOSIS — E1121 Type 2 diabetes mellitus with diabetic nephropathy: Secondary | ICD-10-CM | POA: Diagnosis not present

## 2017-10-07 DIAGNOSIS — I10 Essential (primary) hypertension: Secondary | ICD-10-CM | POA: Diagnosis not present

## 2017-10-07 MED ORDER — SITAGLIPTIN PHOSPHATE 100 MG PO TABS: 100.0000 mg | ORAL_TABLET | Freq: Every day | ORAL | 1 refills | Status: DC

## 2017-10-07 NOTE — Progress Notes (Signed)
Subjective:  Patient ID: Robert Lat., male    DOB: [birth year]  Age: 67 y.o. MRN: [id number]  CC: Diagnoses of DM type 2, not at goal [facility]), Essential hypertension, and Diabetic nephropathy associated with type 2 diabetes mellitus ([place]) were pertinent to this visit.  HPI Robert Robertson. presents for 3 month follow up on diabetes and hypertension .  Patient has no complaints today.  Patient is following a low glycemic index diet and taking all prescribed medications regularly without side effects.  Fasting sugars have been under less than 120 most of the time and post prandials have been under 160 except on rare occasions. Patient is  Not exercising about 3 times per week and not intentionally trying to lose weight .  Patient has had an eye exam in the last 12 months and checks feet regularly for signs of infection.  Patient does not walk barefoot outside,  And denies anynumbness tingling or burning in feet. Patient is up to date on all recommended vaccinations  Hypertension: patient checks blood pressure daily at  home.  Readings have been for the most part > 140/80 at rest . Patient is following a reduce salt diet most days and is taking medications as prescribed.  Not exercising regularly     Foot eam done  Lab Results  Component Value Date   HGBA1C 7.5 (H) 10/05/2017    Outpatient Medications Prior to Visit  Medication Sig Dispense Refill  . aspirin 81 MG tablet Take 81 mg by mouth daily.    [name] atorvastatin (LIPITOR) 80 MG tablet TAKE 1 TABLET DAILY 90 tablet 0  . Blood Glucose Monitoring Suppl (FREESTYLE FREEDOM LITE) w/Device KIT 1 application by Does not apply route 2 (two) times daily. 1 each 0  . glucose blood (FREESTYLE LITE) test strip CHECK SUGARS TWICE A DAY 200 each 1  . Krill Oil 300 MG CAPS Take 1 capsule by mouth daily.    . Lancets (FREESTYLE) lancets CHECK SUGAR TWICE A DAY 200 each 0  . lisinopril (PRINIVIL,ZESTRIL) 20 MG tablet Take 1 tablet (20 mg  total) by mouth daily. 90 tablet 1  . metFORMIN (GLUCOPHAGE-XR) 500 MG 24 hr tablet TAKE 1 TABLET DAILY WITH BREAKFAST 90 tablet 0  . Multiple Vitamins-Minerals (CENTRUM SILVER ADULT 50+ PO) Take 1 tablet by mouth daily.    [name] RA KRILL OIL 500 MG CAPS Take by mouth.    . sitaGLIPtin (JANUVIA) 50 MG tablet Take 1 tablet (50 mg total) by mouth daily. 90 tablet 0  . Cinnamon 500 MG capsule Take by mouth.     No facility-administered medications prior to visit.     Review of Systems;  Patient denies headache, fevers, malaise, unintentional weight loss, skin rash, eye pain, sinus congestion and sinus pain, sore throat, dysphagia,  hemoptysis , cough, dyspnea, wheezing, chest pain, palpitations, orthopnea, edema, abdominal pain, nausea, melena, diarrhea, constipation, flank pain, dysuria, hematuria, urinary  Frequency, nocturia, numbness, tingling, seizures,  Focal weakness, Loss of consciousness,  Tremor, insomnia, depression, anxiety, and suicidal ideation.      Objective:  BP (!) 148/88 (BP Location: Left Arm, Patient Position: Sitting, Cuff Size: Normal)   Pulse 88   Temp 98.2 F (36.8 C)   Wt 197 lb 12.8 oz (89.7 kg)   SpO2 96%   BMI 30.08 kg/m   BP Readings from Last 3 Encounters:  10/07/17 (!) 148/88  07/07/17 130/82  12/28/16 130/70    Wt Readings from Last 3  Encounters:  10/07/17 197 lb 12.8 oz (89.7 kg)  07/07/17 206 lb (93.4 kg)  12/28/16 204 lb 6.4 oz (92.7 kg)    General appearance: alert, cooperative and appears stated age Ears: normal TM's and external ear canals both ears Throat: lips, mucosa, and tongue normal; teeth and gums normal Neck: no adenopathy, no carotid bruit, supple, symmetrical, trachea midline and thyroid not enlarged, symmetric, no tenderness/mass/nodules Back: symmetric, no curvature. ROM normal. No CVA tenderness. Lungs: clear to auscultation bilaterally Heart: regular rate and rhythm, S1, S2 normal, no murmur, click, rub or gallop Abdomen:  soft, non-tender; bowel sounds normal; no masses,  no organomegaly Pulses: 2+ and symmetric Skin: Skin color, texture, turgor normal. No rashes or lesions Lymph nodes: Cervical, supraclavicular, and axillary nodes normal.  Lab Results  Component Value Date   HGBA1C 7.5 (H) 10/05/2017   HGBA1C 8.2 (H) 07/07/2017   HGBA1C 7.3 (H) 08/31/2016    Lab Results  Component Value Date   CREATININE 0.91 10/05/2017   CREATININE 0.92 07/07/2017   CREATININE 0.85 08/31/2016    Lab Results  Component Value Date   WBC 6.2 12/12/2015   HGB 14.6 12/12/2015   HCT 41.7 (A) 12/12/2015   PLT 154 02/28/2012   GLUCOSE 137 (H) 10/05/2017   CHOL 142 10/05/2017   TRIG 79.0 10/05/2017   HDL 50.30 10/05/2017   LDLDIRECT 59.0 08/31/2016   LDLCALC 76 10/05/2017   ALT 38 10/05/2017   AST 33 10/05/2017   NA 138 10/05/2017   K 4.2 10/05/2017   CL 103 10/05/2017   CREATININE 0.91 10/05/2017   BUN 11 10/05/2017   CO2 28 10/05/2017   PSA 2.00 05/14/2014   INR 0.99 02/29/2012   HGBA1C 7.5 (H) 10/05/2017   MICROALBUR 2.1 (H) 07/07/2017    US Abdomen Complete W/elastography  Result Date: 03/11/2017 CLINICAL DATA:  Robert Robertson EXAM: ULTRASOUND ABDOMEN ULTRASOUND HEPATIC ELASTOGRAPHY TECHNIQUE: Sonography of the upper abdomen was performed. In addition, ultrasound elastography evaluation of the liver was performed. A region of interest was placed within the right lobe of the liver. Following application of a compressive sonographic pulse, shear waves were detected in the adjacent hepatic tissue and the shear wave velocity was calculated. Multiple assessments were performed at the selected site. Median shear wave velocity is correlated to a Metavir fibrosis score. COMPARISON:  12/05/2015 FINDINGS: ULTRASOUND ABDOMEN Gallbladder: No gallstones or wall thickening visualized. No sonographic Murphy sign noted by sonographer. Common bile duct: Diameter: Normal caliber, 4 mm Liver: Heterogeneous, increased echotexture  throughout the liver compatible with fatty infiltration or intrinsic liver disease. 1.7 cm right hepatic cyst which appears benign. No suspicious focal bony scratched at no suspicious focal abnormality. Portal vein is patent on color Doppler imaging with normal direction of blood flow towards the liver. IVC: No abnormality visualized. Pancreas: Visualized portion unremarkable. Spleen: Size and appearance within normal limits. Right Kidney: Length: 12.6 cm. Echogenicity within normal limits. No mass or hydronephrosis visualized. Left Kidney: Length: 11.8 cm. Echogenicity within normal limits. No mass or hydronephrosis visualized. Abdominal aorta: No aneurysm visualized. Other findings: None. ULTRASOUND HEPATIC ELASTOGRAPHY Device: Siemens Helix VTQ Patient position: Oblique Transducer 6C1 Number of measurements: 10 Hepatic segment:  8 Median velocity:   0.92  m/sec IQR: 0.17 IQR/Median velocity ratio: 0.18 Corresponding Metavir fibrosis score:  F0/F1 Risk of fibrosis: Minimal Limitations of exam: None Pertinent findings noted on other imaging exams:  None Please note that abnormal shear wave velocities may also be identified in clinical settings other than  with hepatic fibrosis, such as: acute hepatitis, elevated right heart and central venous pressures including use of beta blockers, veno-occlusive disease ([place]udd[place]Chiari), infiltrative processes such as mastocytosis/amyloidosis/infiltrative tumor, extrahepatic cholestasis, in the post-prandial state, and liver transplantation. Correlation with patient history, laboratory data, and clinical condition recommended. IMPRESSION: ULTRASOUND ABDOMEN: Increased, heterogeneous echotexture throughout the liver compatible with fatty infiltration or intrinsic liver disease. ULTRASOUND HEPATIC ELASTOGRAPHY: Median hepatic shear wave velocity is calculated at 0.92 m/sec. Corresponding Metavir fibrosis score is  F0/F1. Risk of fibrosis is Minimal. Follow-up: None required  Electronically Signed   By: Rolm Baptise M.D.   On: 03/11/2017 10:14    Assessment & Plan:   Problem List Items Addressed This Visit    Essential hypertension    Not at goal. He has stopped exercising but plans to resume his program and check readings at home for improvement.  Will consider adding hctz   Lab Results  Component Value Date   CREATININE 0.91 10/05/2017   Lab Results  Component Value Date   NA 138 10/05/2017   K 4.2 10/05/2017   CL 103 10/05/2017   CO2 28 10/05/2017         DM type 2, not at goal [facility])    A1c has improved since last visit, but is not at goal . Advised her to increase his dose of Januvia to 100 mg daily   Lab Results  Component Value Date   HGBA1C 7.5 (H) 10/05/2017         Relevant Medications   sitaGLIPtin (JANUVIA) 100 MG tablet   Diabetic nephropathy associated with type 2 diabetes mellitus ([place])    Minimal microalbuminura. .  Will incr tolerating increase in lisinopril to 20 mg daily .  Lab Results  Component Value Date   CREATININE 0.91 10/05/2017   Lab Results  Component Value Date   NA 138 10/05/2017   K 4.2 10/05/2017   CL 103 10/05/2017   CO2 28 10/05/2017         Relevant Medications   sitaGLIPtin (JANUVIA) 100 MG tablet    A total of 25 minutes of face to face time was spent with patient more than half of which was spent in counselling about the above mentioned conditions  and coordination of care   I have changed Robert Raring Jr.'s sitaGLIPtin. I am also having him maintain his aspirin, Krill Oil, Multiple Vitamins-Minerals (CENTRUM SILVER ADULT 50+ PO), FREESTYLE FREEDOM LITE, glucose blood, Cinnamon, RA KRILL OIL, freestyle, lisinopril, metFORMIN, and atorvastatin.  Meds ordered this encounter  Medications  . sitaGLIPtin (JANUVIA) 100 MG tablet    Sig: Take 1 tablet (100 mg total) by mouth daily.    Dispense:  90 tablet    Refill:  1    Medications Discontinued During This Encounter  Medication Reason    . sitaGLIPtin (JANUVIA) 50 MG tablet     Follow-up: Return in about 3 months (around 01/06/2018) for follow up diabetes.   [name], MD

## 2017-10-07 NOTE — Patient Instructions (Addendum)
Your A1c has improved to 7.6!  Our goal is > 7.0,  So I recommend the following  Increase your  januvia dose to 100 mg daily.  Continue  metfomrin as well     Your blood pressure readings are a little high  Goal is < 130/80.  If after resuming regular exercise,  You do not see a significant change,  Let me know and we will add a mild  Diuretic (HCTZ  In a combination pill ) (give exercise a month)   To flush out the pollen from your sinuses after outside work/play:  You can try [employer]'s Sinus rinse ;  It is a stong sinus "flush" using water and medicated salts.  Do it over the sink because it can be a bit messy

## 2017-10-09 NOTE — Assessment & Plan Note (Addendum)
A1c has improved since last visit, but is not at goal . Advised her to increase his dose of Januvia to 100 mg daily   Lab Results  Component Value Date   HGBA1C 7.5 (H) 10/05/2017

## 2017-10-09 NOTE — Assessment & Plan Note (Addendum)
Not at goal. He has stopped exercising but plans to resume his program and check readings at home for improvement.  Will consider adding hctz   Lab Results  Component Value Date   CREATININE 0.91 10/05/2017   Lab Results  Component Value Date   NA 138 10/05/2017   K 4.2 10/05/2017   CL 103 10/05/2017   CO2 28 10/05/2017

## 2017-10-09 NOTE — Assessment & Plan Note (Signed)
Minimal microalbuminura. .  Will incr tolerating increase in lisinopril to 20 mg daily .  Lab Results  Component Value Date   CREATININE 0.91 10/05/2017   Lab Results  Component Value Date   NA 138 10/05/2017   K 4.2 10/05/2017   CL 103 10/05/2017   CO2 28 10/05/2017

## 2017-10-20 ENCOUNTER — Encounter: Payer: Self-pay | Admitting: Internal Medicine

## 2017-12-12 ENCOUNTER — Other Ambulatory Visit: Payer: Self-pay | Admitting: Internal Medicine

## 2017-12-30 ENCOUNTER — Ambulatory Visit: Payer: Medicare Other

## 2018-01-03 ENCOUNTER — Other Ambulatory Visit: Payer: Medicare Other

## 2018-01-06 ENCOUNTER — Ambulatory Visit: Payer: Medicare Other | Admitting: Internal Medicine

## 2018-01-18 ENCOUNTER — Telehealth: Payer: Self-pay

## 2018-01-18 MED ORDER — SITAGLIPTIN PHOSPHATE 100 MG PO TABS: 100.0000 mg | ORAL_TABLET | Freq: Every day | ORAL | 0 refills | Status: DC

## 2018-01-18 NOTE — Addendum Note (Signed)
Addended by: Adair Laundry on: 01/18/2018 03:46 PM   Modules accepted: Orders

## 2018-01-18 NOTE — Telephone Encounter (Signed)
One month supply has been sent to [facility] on [address]. Pt is aware rx was sent in.

## 2018-01-18 NOTE — Telephone Encounter (Signed)
Copied from [place] [phone]. Topic: Quick Communication - See Telephone Encounter >> Jan 18, 2018 10:58 AM Alfredia Ferguson R wrote: Pt called in stating that he refilled his Januvia through the mail order pharmacy and it wont arrive until late is there anyway can get 10 tablets sent to [place], [place] [phone] (Phone) [phone] (Fax)    Please call pt with information [phone]

## 2018-01-26 ENCOUNTER — Ambulatory Visit (INDEPENDENT_AMBULATORY_CARE_PROVIDER_SITE_OTHER): Payer: Medicare Other

## 2018-01-26 ENCOUNTER — Telehealth: Payer: Self-pay

## 2018-01-26 VITALS — BP 128/68 | HR 95 | Temp 98.8°F | Resp 15 | Ht 68.0 in | Wt 200.4 lb

## 2018-01-26 DIAGNOSIS — Z0000 Encounter for general adult medical examination without abnormal findings: Secondary | ICD-10-CM

## 2018-01-26 DIAGNOSIS — I10 Essential (primary) hypertension: Secondary | ICD-10-CM

## 2018-01-26 DIAGNOSIS — E119 Type 2 diabetes mellitus without complications: Secondary | ICD-10-CM

## 2018-01-26 DIAGNOSIS — E7800 Pure hypercholesterolemia, unspecified: Secondary | ICD-10-CM

## 2018-01-26 NOTE — Patient Instructions (Addendum)
  Mr. Cinque , Thank you for taking time to come for your Medicare Wellness Visit. I appreciate your ongoing commitment to your health goals. Please review the following plan we discussed and let me know if I can assist you in the future.   Follow up as needed.    Bring a copy of your [place] and/or Living Will to be scanned into chart once completed.   Return for fasting labs.   Have a great day!  These are the goals we discussed: Goals    . Increase physical activity     150 minutes of exercise per week (5 days, 30 minutes)       This is a list of the screening recommended for you and due dates:  Health Maintenance  Topic Date Due  . Complete foot exam   05/28/2017  . Flu Shot  01/26/2018  . Hemoglobin A1C  04/06/2018  . Eye exam for diabetics  09/06/2018  . Pneumonia vaccines (2 of 2 - PPSV23) 05/09/2019  . Colon Cancer Screening  06/26/2024  . Tetanus Vaccine  08/07/2024  .  Hepatitis C: One time screening is recommended by Center for Disease Control  (CDC) for  adults born from [birth year] through 1965.   Completed

## 2018-01-26 NOTE — Telephone Encounter (Signed)
Labs have been ordered for lab appt.  

## 2018-01-26 NOTE — Progress Notes (Addendum)
Subjective:   Robert Hands. is a 67 y.o. male who presents for Medicare Annual/Subsequent preventive examination.  Review of Systems:  No ROS.  Medicare Wellness Visit.  Additional risk factors are reflected in the social history. Cardiac Risk Factors include: advanced age (>71mn, >>67women);male gender;hypertension;diabetes mellitus;obesity (BMI >30kg/m2)     Objective:    Vitals: BP 128/68 (BP Location: Left Arm, Patient Position: Sitting, Cuff Size: Normal)   Pulse 95   Temp 98.8 F (37.1 C) (Oral)   Resp 15   Ht '5\' 8"'$  (1.727 m)   Wt 200 lb 6.4 oz (90.9 kg)   SpO2 95%   BMI 30.47 kg/m   Body mass index is 30.47 kg/m.  Advanced Directives 01/26/2018 12/28/2016  Does Patient Have a Medical Advance Directive? No No  Would patient like information on creating a medical advance directive? No - Patient declined Yes (MAU/Ambulatory/Procedural Areas - Information given)    Tobacco Social History   Tobacco Use  Smoking Status Former Smoker  . Packs/day: 1.00  . Years: 30.00  . Pack years: 30.00  . Types: Cigarettes  Smokeless Tobacco Never Used     Counseling given: Not Answered   Clinical Intake:  Pre-visit preparation completed: Yes  Pain : No/denies pain     Nutritional Status: BMI > 30  Obese Diabetes: No  How often do you need to have someone help you when you read instructions, pamphlets, or other written materials from your doctor or pharmacy?: 1 - Never  Interpreter Needed?: No     Past Medical History:  Diagnosis Date  . Allergy   . Colon polyp   . Diabetes mellitus   . Hyperlipidemia   . Hypertension    Past Surgical History:  Procedure Laterality Date  . COLONOSCOPY  2010   10 mm adenomatous polyp in the descending colon without atypia, DLucilla Lame MD   Family History  Problem Relation Age of Onset  . Diabetes Mother   . Stroke Mother   . Diabetes Brother   . Diabetes Maternal Grandfather   . Diabetes Maternal Grandmother    . Cancer Sister        brain tumor   Social History   Socioeconomic History  . Marital status: Married    Spouse name: Not on file  . Number of children: Not on file  . Years of education: Not on file  . Highest education level: Not on file  Occupational History  . Not on file  Social Needs  . Financial resource strain: Not hard at all  . Food insecurity:    Worry: Never true    Inability: Never true  . Transportation needs:    Medical: No    Non-medical: No  Tobacco Use  . Smoking status: Former Smoker    Packs/day: 1.00    Years: 30.00    Pack years: 30.00    Types: Cigarettes  . Smokeless tobacco: Never Used  Substance and Sexual Activity  . Alcohol use: Yes    Alcohol/week: 0.6 oz    Types: 1 Glasses of wine per week    Comment: daily  . Drug use: No  . Sexual activity: Not on file  Lifestyle  . Physical activity:    Days per week: 0 days    Minutes per session: Not on file  . Stress: Not at all  Relationships  . Social connections:    Talks on phone: Not on file    Gets together: Not  on file    Attends religious service: Not on file    Active member of club or organization: Not on file    Attends meetings of clubs or organizations: Not on file    Relationship status: Not on file  Other Topics Concern  . Not on file  Social History Narrative  . Not on file    Outpatient Encounter Medications as of 01/26/2018  Medication Sig  . aspirin 81 MG tablet Take 81 mg by mouth daily.  [name] atorvastatin (LIPITOR) 80 MG tablet TAKE 1 TABLET DAILY  . Blood Glucose Monitoring Suppl (FREESTYLE FREEDOM LITE) w/Device KIT 1 application by Does not apply route 2 (two) times daily.  . Cinnamon 500 MG capsule Take by mouth.  [name] glucose blood (FREESTYLE LITE) test strip USE TO CHECK SUGARS TWICE A DAY  . Krill Oil 300 MG CAPS Take 1 capsule by mouth daily.  . Lancets (FREESTYLE) lancets CHECK SUGAR TWICE A DAY  . lisinopril (PRINIVIL,ZESTRIL) 20 MG tablet Take 1 tablet (20  mg total) by mouth daily.  . metFORMIN (GLUCOPHAGE-XR) 500 MG 24 hr tablet TAKE 1 TABLET DAILY WITH BREAKFAST  . Multiple Vitamins-Minerals (CENTRUM SILVER ADULT 50+ PO) Take 1 tablet by mouth daily.  [name] RA KRILL OIL 500 MG CAPS Take by mouth.  . sitaGLIPtin (JANUVIA) 100 MG tablet Take 1 tablet (100 mg total) by mouth daily.   No facility-administered encounter medications on file as of 01/26/2018.     Activities of Daily Living In your present state of health, do you have any difficulty performing the following activities: 01/26/2018  Hearing? N  Vision? N  Difficulty concentrating or making decisions? N  Walking or climbing stairs? N  Dressing or bathing? N  Doing errands, shopping? N  Preparing Food and eating ? N  Using the Toilet? N  In the past six months, have you accidently leaked urine? N  Do you have problems with loss of bowel control? N  Managing your Medications? N  Managing your Finances? N  Housekeeping or managing your Housekeeping? N  Some recent data might be hidden    Patient Care Team: [name], MD as PCP - General (Internal Medicine) [name], MD (Internal Medicine) Bary Castilla Forest Gleason, MD (General Surgery)   Assessment:   This is a routine wellness examination for Robert Robertson.  The goal of the wellness visit is to assist the patient how to close the gaps in care and create a preventative care plan for the patient.   The roster of all physicians providing medical care to patient is listed in the Snapshot section of the chart.  Osteoporosis risk reviewed.    Safety issues reviewed; Primary caretaker of his grandchildren and wife who has dementia. Smoke and carbon monoxide detectors in the home. No firearms in the home. Wears seatbelts when driving or riding with others. No violence in the home.  They do not have excessive sun exposure.  Discussed the need for sun protection: hats, long sleeves and the use of sunscreen if there is significant sun  exposure.   Patient is alert, normal appearance, oriented to person/place/and time. Correctly identified the president of the [place] and recalls of 3/3 words.Performs simple calculations and can read correct time from watch face. Displays appropriate judgement.  No new identified risk were noted.  No failures at ADL's or IADL's.    BMI- discussed the importance of a healthy diet, water intake and the benefits of aerobic exercise. He plans to  increase his physical activity, has adequate water intake and tries to have a healthy diet.  24 hour diet recall: Moderate diet, monitoring sugar and cholesterol intake. Currently partnering with his daughter in seeing a nutrionist.   Dental- every 6 months.  Andre Lefort.   Sleep patterns- Sleeps 6 hours at night.  Wakes feeling rested.   HTN- followed by pcp.  He plans to bring home recorded log to upcoming visit.  Taking medication as prescribed. Denies headache, chest pain, blurred vision, dizziness.   Diabetes- followed by pcp.  Reports averaging blood sugar less than 120, monitors 1-2x daily. Taking medication as directed although he hopes to be able to manage with diet and exercise.  Glucerna  provided as a meal or snack. Additional information on managing blood sugar provided. Reports no wounds or concerns with feet.  No longer seeing the podiatrist but he is monitoring closely.    Exercise Activities and Dietary recommendations Current Exercise Habits: The patient does not participate in regular exercise at present  Goals    . Increase physical activity     150 minutes of exercise per week (5 days, 30 minutes)       Fall Risk Fall Risk  01/26/2018 12/28/2016 12/15/2015 12/12/2015  Falls in the past year? No No No No   Depression Screen PHQ 2/9 Scores 01/26/2018 12/28/2016 12/15/2015 12/12/2015  PHQ - 2 Score 0 0 0 0    Cognitive Function MMSE - Mini Mental State Exam 01/26/2018 12/28/2016  Orientation to time 5 5  Orientation to Place 5 5    Registration 3 3  Attention/ Calculation 5 5  Recall 3 3  Language- name 2 objects 2 2  Language- repeat 1 1  Language- follow 3 step command 3 3  Language- read & follow direction 1 1  Write a sentence 1 1  Copy design 1 1  Total score 30 30        Immunization History  Administered Date(s) Administered  . Hep A / Hep B 05/07/2015, 06/10/2015, 11/04/2015  . Influenza Split 04/28/2013  . Influenza, High Dose Seasonal PF 02/27/2016, 07/07/2017  . Influenza,inj,Quad PF,6+ Mos 05/08/2014, 05/07/2015  . Pneumococcal Conjugate-13 08/15/2015  . Pneumococcal Polysaccharide-23 05/08/2014  . Tdap 08/07/2014  . Zoster 02/27/2016   Screening Tests Health Maintenance  Topic Date Due  . FOOT EXAM  05/28/2017  . INFLUENZA VACCINE  01/26/2018  . HEMOGLOBIN A1C  04/06/2018  . OPHTHALMOLOGY EXAM  09/06/2018  . PNA vac Low Risk Adult (2 of 2 - PPSV23) 05/09/2019  . COLONOSCOPY  06/26/2024  . TETANUS/TDAP  08/07/2024  . Hepatitis C Screening  Completed      Plan:    End of life planning; Advance aging; Advanced directives discussed. Copy of current HCPOA/Living Will requested once completed.    I have personally reviewed and noted the following in the patient's chart:   . Medical and social history . Use of alcohol, tobacco or illicit drugs  . Current medications and supplements . Functional ability and status . Nutritional status . Physical activity . Advanced directives . List of other physicians . Hospitalizations, surgeries, and ER visits in previous 12 months . Vitals . Screenings to include cognitive, depression, and falls . Referrals and appointments  In addition, I have reviewed and discussed with patient certain preventive protocols, quality metrics, and best practice recommendations. A written personalized care plan for preventive services as well as general preventive health recommendations were provided to patient.  OBrien-Blaney, Diamante Rubin L,  LPN  01/29/3824   I have reviewed the above information and agree with above.   Deborra Medina, MD

## 2018-01-31 ENCOUNTER — Other Ambulatory Visit (INDEPENDENT_AMBULATORY_CARE_PROVIDER_SITE_OTHER): Payer: Medicare Other

## 2018-01-31 DIAGNOSIS — I10 Essential (primary) hypertension: Secondary | ICD-10-CM

## 2018-01-31 DIAGNOSIS — E119 Type 2 diabetes mellitus without complications: Secondary | ICD-10-CM | POA: Diagnosis not present

## 2018-01-31 DIAGNOSIS — E7800 Pure hypercholesterolemia, unspecified: Secondary | ICD-10-CM

## 2018-01-31 LAB — LIPID PANEL
Cholesterol: 142 mg/dL (ref 0–200)
HDL: 59.1 mg/dL (ref >39.00)
LDL CALC: 70 mg/dL (ref 0–99)
NonHDL: 83.37
Total CHOL/HDL Ratio: 2
Triglycerides: 69 mg/dL (ref 0.0–149.0)
VLDL: 13.8 mg/dL (ref 0.0–40.0)

## 2018-01-31 LAB — COMPREHENSIVE METABOLIC PANEL
ALBUMIN: 4.4 g/dL (ref 3.5–5.2)
ALK PHOS: 62 U/L (ref 39–117)
ALT: 35 U/L (ref 0–53)
AST: 25 U/L (ref 0–37)
BUN: 13 mg/dL (ref 6–23)
CHLORIDE: 103 meq/L (ref 96–112)
CO2: 27 mEq/L (ref 19–32)
CREATININE: 0.98 mg/dL (ref 0.40–1.50)
Calcium: 9.7 mg/dL (ref 8.4–10.5)
GFR: 98.05 mL/min (ref >60.00)
Glucose, Bld: 135 mg/dL — ABNORMAL HIGH (ref 70–99)
POTASSIUM: 4.3 meq/L (ref 3.5–5.1)
Sodium: 137 mEq/L (ref 135–145)
TOTAL PROTEIN: 7.3 g/dL (ref 6.0–8.3)
Total Bilirubin: 0.9 mg/dL (ref 0.2–1.2)

## 2018-01-31 LAB — HEMOGLOBIN A1C: HEMOGLOBIN A1C: 7.2 % — AB (ref 4.6–6.5)

## 2018-02-03 ENCOUNTER — Ambulatory Visit (INDEPENDENT_AMBULATORY_CARE_PROVIDER_SITE_OTHER): Payer: Medicare Other | Admitting: Internal Medicine

## 2018-02-03 ENCOUNTER — Encounter: Payer: Self-pay | Admitting: Internal Medicine

## 2018-02-03 DIAGNOSIS — E7800 Pure hypercholesterolemia, unspecified: Secondary | ICD-10-CM | POA: Diagnosis not present

## 2018-02-03 DIAGNOSIS — E1169 Type 2 diabetes mellitus with other specified complication: Secondary | ICD-10-CM | POA: Diagnosis not present

## 2018-02-03 DIAGNOSIS — E1121 Type 2 diabetes mellitus with diabetic nephropathy: Secondary | ICD-10-CM | POA: Diagnosis not present

## 2018-02-03 DIAGNOSIS — E119 Type 2 diabetes mellitus without complications: Secondary | ICD-10-CM | POA: Diagnosis not present

## 2018-02-03 DIAGNOSIS — B351 Tinea unguium: Secondary | ICD-10-CM | POA: Diagnosis not present

## 2018-02-03 DIAGNOSIS — I10 Essential (primary) hypertension: Secondary | ICD-10-CM | POA: Diagnosis not present

## 2018-02-03 MED ORDER — ZOSTER VAC RECOMB ADJUVANTED 50 MCG/0.5ML IM SUSR: 0.5000 mL | Freq: Once | INTRAMUSCULAR | 1 refills | Status: AC

## 2018-02-03 MED ORDER — LISINOPRIL-HYDROCHLOROTHIAZIDE 20-25 MG PO TABS: 1.0000 | ORAL_TABLET | Freq: Every day | ORAL | 3 refills | Status: DC

## 2018-02-03 NOTE — Progress Notes (Signed)
Subjective:  Patient ID: Robert Lat., male    DOB: November 29, 1950  Age: 67 y.o. MRN: [id number]  CC: Diagnoses of Essential hypertension, DM type 2, not at goal [facility]), Pure hypercholesterolemia, Diabetic nephropathy associated with type 2 diabetes mellitus ([place]), and Onychomycosis of multiple toenails with type 2 diabetes mellitus ([place]) were pertinent to this visit.  HPI Robert Robertson. presents for 3 month follow up on diabetes and hypertension .  Patient has no complaints today.  Patient is following a low glycemic index diet and taking all prescribed medications regularly without side effects.  Fasting sugars have been under less than 120 most of the time and post prandials have been under 100 except on rare occasions. Patient is gardening but not exercising  Once or twice week and intentionally trying to lose weight .  Patient has had an eye exam in the last 12 months and checks feet regularly for signs of infection.  Patient does not walk barefoot outside,  And denies an numbness tingling or burning in feet. Patient is up to date on all recommended vaccinations  BP : checking regularly.  Takes lisinopril in the am.  136/90 pulse 80-90 . Notes that after gardening his blood pressure is  is higher and pulse is elevated as well   Foot exam done.  Using tea tree oil for fungus     Outpatient Medications Prior to Visit  Medication Sig Dispense Refill  . aspirin 81 MG tablet Take 81 mg by mouth daily.    [name] atorvastatin (LIPITOR) 80 MG tablet TAKE 1 TABLET DAILY 90 tablet 1  . Blood Glucose Monitoring Suppl (FREESTYLE FREEDOM LITE) w/Device KIT 1 application by Does not apply route 2 (two) times daily. 1 each 0  . glucose blood (FREESTYLE LITE) test strip USE TO CHECK SUGARS TWICE A DAY 200 each 1  . Krill Oil 300 MG CAPS Take 1 capsule by mouth daily.    . Lancets (FREESTYLE) lancets CHECK SUGAR TWICE A DAY 200 each 0  . metFORMIN (GLUCOPHAGE-XR) 500 MG 24 hr tablet TAKE 1  TABLET DAILY WITH BREAKFAST 90 tablet 1  . Multiple Vitamins-Minerals (CENTRUM SILVER ADULT 50+ PO) Take 1 tablet by mouth daily.    . sitaGLIPtin (JANUVIA) 100 MG tablet Take 1 tablet (100 mg total) by mouth daily. 30 tablet 0  . lisinopril (PRINIVIL,ZESTRIL) 20 MG tablet Take 1 tablet (20 mg total) by mouth daily. 90 tablet 1  . Cinnamon 500 MG capsule Take by mouth.    . RA KRILL OIL 500 MG CAPS Take by mouth.     No facility-administered medications prior to visit.     Review of Systems;  Patient denies headache, fevers, malaise, unintentional weight loss, skin rash, eye pain, sinus congestion and sinus pain, sore throat, dysphagia,  hemoptysis , cough, dyspnea, wheezing, chest pain, palpitations, orthopnea, edema, abdominal pain, nausea, melena, diarrhea, constipation, flank pain, dysuria, hematuria, urinary  Frequency, nocturia, numbness, tingling, seizures,  Focal weakness, Loss of consciousness,  Tremor, insomnia, depression, anxiety, and suicidal ideation.      Objective:  BP (!) 142/84 (BP Location: Left Arm, Patient Position: Sitting, Cuff Size: Normal)   Pulse (!) 106   Temp 98.7 F (37.1 C) (Oral)   Resp 15   Ht 5' 8"  (1.727 m)   Wt 194 lb 12.8 oz (88.4 kg)   SpO2 96%   BMI 29.62 kg/m   BP Readings from Last 3 Encounters:  02/03/18 (!) 142/84  01/26/18  128/68  10/07/17 (!) 148/88    Wt Readings from Last 3 Encounters:  02/03/18 194 lb 12.8 oz (88.4 kg)  01/26/18 200 lb 6.4 oz (90.9 kg)  10/07/17 197 lb 12.8 oz (89.7 kg)    General appearance: alert, cooperative and appears stated age Ears: normal TM's and external ear canals both ears Throat: lips, mucosa, and tongue normal; teeth and gums normal Neck: no adenopathy, no carotid bruit, supple, symmetrical, trachea midline and thyroid not enlarged, symmetric, no tenderness/mass/nodules Back: symmetric, no curvature. ROM normal. No CVA tenderness. Lungs: clear to auscultation bilaterally Heart: regular rate and  rhythm, S1, S2 normal, no murmur, click, rub or gallop Abdomen: soft, non-tender; bowel sounds normal; no masses,  no organomegaly Pulses: 2+ and symmetric Skin: Skin color, texture, turgor normal. No rashes or lesions Lymph nodes: Cervical, supraclavicular, and axillary nodes normal.  Lab Results  Component Value Date   HGBA1C 7.2 (H) 01/31/2018   HGBA1C 7.5 (H) 10/05/2017   HGBA1C 8.2 (H) 07/07/2017    Lab Results  Component Value Date   CREATININE 0.98 01/31/2018   CREATININE 0.91 10/05/2017   CREATININE 0.92 07/07/2017    Lab Results  Component Value Date   WBC 6.2 12/12/2015   HGB 14.6 12/12/2015   HCT 41.7 (A) 12/12/2015   PLT 154 02/28/2012   GLUCOSE 135 (H) 01/31/2018   CHOL 142 01/31/2018   TRIG 69.0 01/31/2018   HDL 59.10 01/31/2018   LDLDIRECT 59.0 08/31/2016   LDLCALC 70 01/31/2018   ALT 35 01/31/2018   AST 25 01/31/2018   NA 137 01/31/2018   K 4.3 01/31/2018   CL 103 01/31/2018   CREATININE 0.98 01/31/2018   BUN 13 01/31/2018   CO2 27 01/31/2018   PSA 2.00 05/14/2014   INR 0.99 02/29/2012   HGBA1C 7.2 (H) 01/31/2018   MICROALBUR 2.1 (H) 07/07/2017    US Abdomen Complete W/elastography  Result Date: 03/11/2017 CLINICAL DATA:  [name] EXAM: ULTRASOUND ABDOMEN ULTRASOUND HEPATIC ELASTOGRAPHY TECHNIQUE: Sonography of the upper abdomen was performed. In addition, ultrasound elastography evaluation of the liver was performed. A region of interest was placed within the right lobe of the liver. Following application of a compressive sonographic pulse, shear waves were detected in the adjacent hepatic tissue and the shear wave velocity was calculated. Multiple assessments were performed at the selected site. Median shear wave velocity is correlated to a Metavir fibrosis score. COMPARISON:  12/05/2015 FINDINGS: ULTRASOUND ABDOMEN Gallbladder: No gallstones or wall thickening visualized. No sonographic Murphy sign noted by sonographer. Common bile duct: Diameter: Normal  caliber, 4 mm Liver: Heterogeneous, increased echotexture throughout the liver compatible with fatty infiltration or intrinsic liver disease. 1.7 cm right hepatic cyst which appears benign. No suspicious focal bony scratched at no suspicious focal abnormality. Portal vein is patent on color Doppler imaging with normal direction of blood flow towards the liver. IVC: No abnormality visualized. Pancreas: Visualized portion unremarkable. Spleen: Size and appearance within normal limits. Right Kidney: Length: 12.6 cm. Echogenicity within normal limits. No mass or hydronephrosis visualized. Left Kidney: Length: 11.8 cm. Echogenicity within normal limits. No mass or hydronephrosis visualized. Abdominal aorta: No aneurysm visualized. Other findings: None. ULTRASOUND HEPATIC ELASTOGRAPHY Device: Siemens Helix VTQ Patient position: Oblique Transducer 6C1 Number of measurements: 10 Hepatic segment:  8 Median velocity:   0.92  m/sec IQR: 0.17 IQR/Median velocity ratio: 0.18 Corresponding Metavir fibrosis score:  F0/F1 Risk of fibrosis: Minimal Limitations of exam: None Pertinent findings noted on other imaging exams:  None Please  note that abnormal shear wave velocities may also be identified in clinical settings other than with hepatic fibrosis, such as: acute hepatitis, elevated right heart and central venous pressures including use of beta blockers, veno-occlusive disease (Budd-Chiari), infiltrative processes such as mastocytosis/amyloidosis/infiltrative tumor, extrahepatic cholestasis, in the post-prandial state, and liver transplantation. Correlation with patient history, laboratory data, and clinical condition recommended. IMPRESSION: ULTRASOUND ABDOMEN: Increased, heterogeneous echotexture throughout the liver compatible with fatty infiltration or intrinsic liver disease. ULTRASOUND HEPATIC ELASTOGRAPHY: Median hepatic shear wave velocity is calculated at 0.92 m/sec. Corresponding Metavir fibrosis score is  F0/F1. Risk  of fibrosis is Minimal. Follow-up: None required Electronically Signed   By: Rolm Baptise M.D.   On: 03/11/2017 10:14    Assessment & Plan:   Problem List Items Addressed This Visit    Essential hypertension    Not at goal on losartan alone..  Adding hctz Lab Results  Component Value Date   CREATININE 0.98 01/31/2018   Lab Results  Component Value Date   NA 137 01/31/2018   K 4.3 01/31/2018   CL 103 01/31/2018   CO2 27 01/31/2018         Relevant Medications   lisinopril-hydrochlorothiazide (PRINZIDE,ZESTORETIC) 20-25 MG tablet   DM type 2, not at goal [facility])    A1c has improved since last visit with increase in  Januvia to 100 mg daily   Lab Results  Component Value Date   HGBA1C 7.2 (H) 01/31/2018   Lab Results  Component Value Date   MICROALBUR 2.1 (H) 07/07/2017         Relevant Medications   lisinopril-hydrochlorothiazide (PRINZIDE,ZESTORETIC) 20-25 MG tablet   Hyperlipidemia    LDL and triglycerides are at goal on current medications. He has no side effects and liver enzymes are normal. No changes today  Lab Results  Component Value Date   CHOL 142 01/31/2018   HDL 59.10 01/31/2018   LDLCALC 70 01/31/2018   LDLDIRECT 59.0 08/31/2016   TRIG 69.0 01/31/2018   CHOLHDL 2 01/31/2018   Lab Results  Component Value Date   ALT 35 01/31/2018   AST 25 01/31/2018   ALKPHOS 62 01/31/2018   BILITOT 0.9 01/31/2018           Relevant Medications   lisinopril-hydrochlorothiazide (PRINZIDE,ZESTORETIC) 20-25 MG tablet   Diabetic nephropathy associated with type 2 diabetes mellitus (H[facility])    Continue losartan.   Lab Results  Component Value Date   MICROALBUR 2.1 (H) 07/07/2017         Relevant Medications   lisinopril-hydrochlorothiazide (PRINZIDE,ZESTORETIC) 20-25 MG tablet   Onychomycosis of multiple toenails with type 2 diabetes mellitus ([place])    Using tea tree oil rather than stopping statin to use antifungal.       Relevant Medications    lisinopril-hydrochlorothiazide (PRINZIDE,ZESTORETIC) 20-25 MG tablet      I have discontinued Rochele Raring Jr.'s Cinnamon, RA KRILL OIL, and lisinopril. I am also having him start on lisinopril-hydrochlorothiazide and Zoster Vaccine Adjuvanted. Additionally, I am having him maintain his aspirin, Krill Oil, Multiple Vitamins-Minerals (CENTRUM SILVER ADULT 50+ PO), FREESTYLE FREEDOM LITE, freestyle, metFORMIN, atorvastatin, glucose blood, and sitaGLIPtin.  Meds ordered this encounter  Medications  . lisinopril-hydrochlorothiazide (PRINZIDE,ZESTORETIC) 20-25 MG tablet    Sig: Take 1 tablet by mouth daily.    Dispense:  90 tablet    Refill:  3  . Zoster Vaccine Adjuvanted [facility]) injection    Sig: Inject 0.5 mLs into the muscle once for 1 dose.  Dispense:  1 each    Refill:  1    Medications Discontinued During This Encounter  Medication Reason  . Cinnamon 500 MG capsule Patient Preference  . RA KRILL OIL 500 MG CAPS Patient Preference  . lisinopril (PRINIVIL,ZESTRIL) 20 MG tablet     Follow-up: Return in about 4 months (around 06/05/2018) for follow up diabetes.   [name], MD

## 2018-02-03 NOTE — Patient Instructions (Signed)
  The new goals for optimal blood pressure management are 130/80 or less.   I have changed your blood pressure medication to "losartan hct>"  This is a combination pill that has a mild diuretic that lowers blood pressure ,  After you have ben aking it for at least a week,   Please check your blood pressure a few times at home and send me the readings so I can determine if you need another change in dose of medication    Your diabetes is  under very good  control  And your cholesterol and other labs are also normal. Please continue your current medications. return in 4 months for follow up on diabetes

## 2018-02-05 DIAGNOSIS — E1169 Type 2 diabetes mellitus with other specified complication: Secondary | ICD-10-CM

## 2018-02-05 DIAGNOSIS — B351 Tinea unguium: Secondary | ICD-10-CM | POA: Insufficient documentation

## 2018-02-05 NOTE — Assessment & Plan Note (Signed)
Using tea tree oil rather than stopping statin to use antifungal.

## 2018-02-05 NOTE — Assessment & Plan Note (Signed)
A1c has improved since last visit with increase in  Januvia to 100 mg daily   Lab Results  Component Value Date   HGBA1C 7.2 (H) 01/31/2018   Lab Results  Component Value Date   MICROALBUR 2.1 (H) 07/07/2017

## 2018-02-05 NOTE — Assessment & Plan Note (Signed)
Continue losartan.   Lab Results  Component Value Date   MICROALBUR 2.1 (H) 07/07/2017

## 2018-02-05 NOTE — Assessment & Plan Note (Signed)
LDL and triglycerides are at goal on current medications. He has no side effects and liver enzymes are normal. No changes today  Lab Results  Component Value Date   CHOL 142 01/31/2018   HDL 59.10 01/31/2018   LDLCALC 70 01/31/2018   LDLDIRECT 59.0 08/31/2016   TRIG 69.0 01/31/2018   CHOLHDL 2 01/31/2018   Lab Results  Component Value Date   ALT 35 01/31/2018   AST 25 01/31/2018   ALKPHOS 62 01/31/2018   BILITOT 0.9 01/31/2018

## 2018-02-05 NOTE — Assessment & Plan Note (Addendum)
Not at goal on losartan alone..  Adding hctz Lab Results  Component Value Date   CREATININE 0.98 01/31/2018   Lab Results  Component Value Date   NA 137 01/31/2018   K 4.3 01/31/2018   CL 103 01/31/2018   CO2 27 01/31/2018

## 2018-02-10 ENCOUNTER — Ambulatory Visit (INDEPENDENT_AMBULATORY_CARE_PROVIDER_SITE_OTHER): Payer: Medicare Other | Admitting: Physician Assistant

## 2018-02-10 ENCOUNTER — Encounter: Payer: Self-pay | Admitting: Physician Assistant

## 2018-02-10 ENCOUNTER — Other Ambulatory Visit: Payer: Self-pay

## 2018-02-10 VITALS — BP 130/81 | HR 79 | Temp 97.9°F | Resp 16 | Ht 68.0 in | Wt 199.8 lb

## 2018-02-10 DIAGNOSIS — R040 Epistaxis: Secondary | ICD-10-CM

## 2018-02-10 NOTE — Progress Notes (Signed)
02/10/2018 1:44 PM   DOB: 04/20/1951 / MRN: [id number]  SUBJECTIVE:  Robert Robertson. is a 67 y.o. male presenting for epistaxis. Symptoms present for about 4 hours.  Denies dizziness.  The problem is improving. He has tried a nose clip. No trauma.  He has a history of same and has seen ENT who recommended ablation. No using antihistamines or flonase.   He is allergic to pollen extract.   He  has a past medical history of Allergy, Colon polyp, Diabetes mellitus, Hyperlipidemia, and Hypertension.    He  reports that he has quit smoking. His smoking use included cigarettes. He has a 30.00 pack-year smoking history. He has never used smokeless tobacco. He reports that he drinks about 1.0 standard drinks of alcohol per week. He reports that he does not use drugs. He  has no sexual activity history on file. The patient  has a past surgical history that includes Colonoscopy (2010).  His family history includes Cancer in his sister; Diabetes in his brother, maternal grandfather, maternal grandmother, and mother; Stroke in his mother.  Review of Systems  Constitutional: Negative for chills, diaphoresis and fever.  Gastrointestinal: Negative for nausea.  Skin: Negative for rash.  Neurological: Negative for dizziness.  Endo/Heme/Allergies: Bruises/bleeds easily.    The problem list and medications were reviewed and updated by myself where necessary and exist elsewhere in the encounter.   OBJECTIVE:  BP 130/81   Pulse 79   Temp 97.9 F (36.6 C)   Resp 16   Ht 5\' 8"  (1.727 m)   Wt 199 lb 12.8 oz (90.6 kg)   SpO2 97%   BMI 30.38 kg/m   Wt Readings from Last 3 Encounters:  02/10/18 199 lb 12.8 oz (90.6 kg)  02/03/18 194 lb 12.8 oz (88.4 kg)  01/26/18 200 lb 6.4 oz (90.9 kg)   Temp Readings from Last 3 Encounters:  02/10/18 97.9 F (36.6 C)  02/03/18 98.7 F (37.1 C) (Oral)  01/26/18 98.8 F (37.1 C) (Oral)   BP Readings from Last 3 Encounters:  02/10/18 130/81  02/03/18  (!) 142/84  01/26/18 128/68   Pulse Readings from Last 3 Encounters:  02/10/18 79  02/03/18 (!) 106  01/26/18 95    Physical Exam  Constitutional: He is oriented to person, place, and time. He appears well-developed. He does not appear ill.  HENT:  Head:    Eyes: Pupils are equal, round, and reactive to light. Conjunctivae and EOM are normal.  Cardiovascular: Normal rate.  Pulmonary/Chest: Effort normal.  Abdominal: He exhibits no distension.  Musculoskeletal: Normal range of motion.  Neurological: He is alert and oriented to person, place, and time. No cranial nerve deficit. Coordination normal.  Skin: Skin is warm and dry. He is not diaphoretic.  Psychiatric: He has a normal mood and affect.  Nursing note and vitals reviewed.   Lab Results  Component Value Date   HGBA1C 7.2 (H) 01/31/2018    Lab Results  Component Value Date   WBC 6.2 12/12/2015   HGB 14.6 12/12/2015   HCT 41.7 (A) 12/12/2015   MCV 82.4 12/12/2015   PLT 154 02/28/2012    Lab Results  Component Value Date   CREATININE 0.98 01/31/2018   BUN 13 01/31/2018   NA 137 01/31/2018   K 4.3 01/31/2018   CL 103 01/31/2018   CO2 27 01/31/2018    Lab Results  Component Value Date   ALT 35 01/31/2018   AST 25 01/31/2018  ALKPHOS 62 01/31/2018   BILITOT 0.9 01/31/2018    No results found for: TSH  Lab Results  Component Value Date   CHOL 142 01/31/2018   HDL 59.10 01/31/2018   LDLCALC 70 01/31/2018   LDLDIRECT 59.0 08/31/2016   TRIG 69.0 01/31/2018   CHOLHDL 2 01/31/2018     ASSESSMENT AND PLAN:  Robert Robertson was seen today for epistaxis.  Diagnoses and all orders for this visit:  Epistaxis Comments: Stopped with pressure and ice.  Patient advised to pick up some afrin. Stable at the time of discharge.     The patient is advised to call or return to clinic if he does not see an improvement in symptoms, or to seek the care of the closest emergency department if he worsens with the above  plan.   Philis Fendt, MHS, PA-C [facility] 02/10/2018 1:44 PM

## 2018-02-10 NOTE — Patient Instructions (Signed)
° ° ° °  If you have lab work done today you will be contacted with your lab results within the next 2 weeks.  If you have not heard from us then please contact us. The fastest way to get your results is to register for My Chart. ° ° °IF you received an x-ray today, you will receive an invoice from [facility]. Please contact [facility] at [phone] with questions or concerns regarding your invoice.  ° °IF you received labwork today, you will receive an invoice from [facility]. Please contact [facility] at [phone] with questions or concerns regarding your invoice.  ° °Our billing staff will not be able to assist you with questions regarding bills from these companies. ° °You will be contacted with the lab results as soon as they are available. The fastest way to get your results is to activate your My Chart account. Instructions are located on the last page of this paperwork. If you have not heard from us regarding the results in 2 weeks, please contact this office. °  ° ° ° °

## 2018-03-02 DIAGNOSIS — K760 Fatty (change of) liver, not elsewhere classified: Secondary | ICD-10-CM | POA: Diagnosis not present

## 2018-03-03 ENCOUNTER — Other Ambulatory Visit: Payer: Self-pay | Admitting: Nurse Practitioner

## 2018-03-03 DIAGNOSIS — K760 Fatty (change of) liver, not elsewhere classified: Secondary | ICD-10-CM

## 2018-03-06 DIAGNOSIS — H40003 Preglaucoma, unspecified, bilateral: Secondary | ICD-10-CM | POA: Diagnosis not present

## 2018-03-24 ENCOUNTER — Other Ambulatory Visit: Payer: Self-pay

## 2018-03-24 MED ORDER — SITAGLIPTIN PHOSPHATE 100 MG PO TABS: 100.0000 mg | ORAL_TABLET | Freq: Every day | ORAL | 1 refills | Status: DC

## 2018-03-27 ENCOUNTER — Telehealth: Payer: Self-pay

## 2018-03-27 MED ORDER — LISINOPRIL-HYDROCHLOROTHIAZIDE 20-25 MG PO TABS: 1.0000 | ORAL_TABLET | Freq: Every day | ORAL | 0 refills | Status: DC

## 2018-03-27 NOTE — Addendum Note (Signed)
Addended by: Adair Laundry on: 03/27/2018 03:56 PM   Modules accepted: Orders

## 2018-03-27 NOTE — Telephone Encounter (Signed)
Copied from [place] [id number]. Topic: General - Other >> Mar 27, 2018 10:34 AM Yvette Rack wrote: Reason for CRM: Pt states he lost his lisinopril-hydrochlorothiazide (PRINZIDE,ZESTORETIC) 20-25 MG tablet and he needs an emergency refill until he can get it refilled through [facility] mail delivery. Pt requests Rx be sent to [place], [place] [id number]

## 2018-03-27 NOTE — Telephone Encounter (Signed)
Spoke with pt to let him know that a 30 day supply has been sent in to [facility] on [address]. Pt gave a verbal understanding.

## 2018-06-06 ENCOUNTER — Telehealth: Payer: Self-pay | Admitting: Radiology

## 2018-06-06 DIAGNOSIS — E119 Type 2 diabetes mellitus without complications: Secondary | ICD-10-CM

## 2018-06-06 DIAGNOSIS — I10 Essential (primary) hypertension: Secondary | ICD-10-CM

## 2018-06-06 DIAGNOSIS — R040 Epistaxis: Secondary | ICD-10-CM

## 2018-06-06 DIAGNOSIS — K760 Fatty (change of) liver, not elsewhere classified: Secondary | ICD-10-CM

## 2018-06-06 NOTE — Telephone Encounter (Signed)
Pt coming in for labs tomorrow, please place future orders. Thank you.  

## 2018-06-06 NOTE — Addendum Note (Signed)
Addended by: [name] on: 06/06/2018 08:55 AM   Modules accepted: Orders

## 2018-06-07 ENCOUNTER — Telehealth: Payer: Self-pay | Admitting: Radiology

## 2018-06-07 ENCOUNTER — Other Ambulatory Visit (INDEPENDENT_AMBULATORY_CARE_PROVIDER_SITE_OTHER): Payer: Medicare Other

## 2018-06-07 DIAGNOSIS — R040 Epistaxis: Secondary | ICD-10-CM | POA: Diagnosis not present

## 2018-06-07 DIAGNOSIS — K760 Fatty (change of) liver, not elsewhere classified: Secondary | ICD-10-CM | POA: Diagnosis not present

## 2018-06-07 DIAGNOSIS — E119 Type 2 diabetes mellitus without complications: Secondary | ICD-10-CM | POA: Diagnosis not present

## 2018-06-07 LAB — COMPREHENSIVE METABOLIC PANEL
ALBUMIN: 4.4 g/dL (ref 3.5–5.2)
ALK PHOS: 61 U/L (ref 39–117)
ALT: 38 U/L (ref 0–53)
AST: 28 U/L (ref 0–37)
BILIRUBIN TOTAL: 0.7 mg/dL (ref 0.2–1.2)
BUN: 15 mg/dL (ref 6–23)
CHLORIDE: 99 meq/L (ref 96–112)
CO2: 30 meq/L (ref 19–32)
Calcium: 9.6 mg/dL (ref 8.4–10.5)
Creatinine, Ser: 0.88 mg/dL (ref 0.40–1.50)
GFR: 110.9 mL/min (ref >60.00)
GLUCOSE: 135 mg/dL — AB (ref 70–99)
POTASSIUM: 4 meq/L (ref 3.5–5.1)
SODIUM: 136 meq/L (ref 135–145)
TOTAL PROTEIN: 7 g/dL (ref 6.0–8.3)

## 2018-06-07 LAB — LIPID PANEL
CHOLESTEROL: 147 mg/dL (ref 0–200)
HDL: 55.1 mg/dL (ref >39.00)
LDL CALC: 78 mg/dL (ref 0–99)
NonHDL: 91.86
TRIGLYCERIDES: 67 mg/dL (ref 0.0–149.0)
Total CHOL/HDL Ratio: 3
VLDL: 13.4 mg/dL (ref 0.0–40.0)

## 2018-06-07 LAB — CBC WITH DIFFERENTIAL/PLATELET
BASOS ABS: 0.1 10*3/uL (ref 0.0–0.1)
Basophils Relative: 2 % (ref 0.0–3.0)
EOS ABS: 0.1 10*3/uL (ref 0.0–0.7)
Eosinophils Relative: 2.2 % (ref 0.0–5.0)
HCT: 43.6 % (ref 39.0–52.0)
HEMOGLOBIN: 14.2 g/dL (ref 13.0–17.0)
Lymphocytes Relative: 29.2 % (ref 12.0–46.0)
Lymphs Abs: 1.6 10*3/uL (ref 0.7–4.0)
MCHC: 32.6 g/dL (ref 30.0–36.0)
MCV: 87 fl (ref 78.0–100.0)
Monocytes Absolute: 0.6 10*3/uL (ref 0.1–1.0)
Monocytes Relative: 10.6 % (ref 3.0–12.0)
Neutro Abs: 3 10*3/uL (ref 1.4–7.7)
Neutrophils Relative %: 56 % (ref 43.0–77.0)
PLATELETS: 170 10*3/uL (ref 150.0–400.0)
RBC: 5.01 Mil/uL (ref 4.22–5.81)
RDW: 13.7 % (ref 11.5–15.5)
WBC: 5.4 10*3/uL (ref 4.0–10.5)

## 2018-06-07 LAB — MICROALBUMIN / CREATININE URINE RATIO
CREATININE, U: 27.8 mg/dL
Microalb Creat Ratio: 2.5 mg/g (ref 0.0–30.0)
Microalb, Ur: <0.7 mg/dL (ref 0.0–1.9)

## 2018-06-07 LAB — HEMOGLOBIN A1C: HEMOGLOBIN A1C: 7.3 % — AB (ref 4.6–6.5)

## 2018-06-07 NOTE — Telephone Encounter (Signed)
Pt came in for labs today and forgot to draw pt INR. Does pt need to return for INR draw? My apologies for this mistake.

## 2018-06-07 NOTE — Telephone Encounter (Signed)
Ok. Thank you and again my apologies for my overlook.

## 2018-06-07 NOTE — Telephone Encounter (Signed)
No ma'm .  We can do it at his visit

## 2018-06-07 NOTE — Addendum Note (Signed)
Addended by: Arby Barrette on: 06/07/2018 09:13 AM   Modules accepted: Orders

## 2018-06-08 ENCOUNTER — Encounter: Payer: Self-pay | Admitting: Internal Medicine

## 2018-06-08 ENCOUNTER — Ambulatory Visit (INDEPENDENT_AMBULATORY_CARE_PROVIDER_SITE_OTHER): Payer: Medicare Other | Admitting: Internal Medicine

## 2018-06-08 VITALS — BP 110/78 | HR 80 | Temp 98.3°F | Resp 15 | Ht 68.0 in | Wt 195.2 lb

## 2018-06-08 DIAGNOSIS — E119 Type 2 diabetes mellitus without complications: Secondary | ICD-10-CM

## 2018-06-08 DIAGNOSIS — E7800 Pure hypercholesterolemia, unspecified: Secondary | ICD-10-CM

## 2018-06-08 DIAGNOSIS — I10 Essential (primary) hypertension: Secondary | ICD-10-CM | POA: Diagnosis not present

## 2018-06-08 DIAGNOSIS — Z23 Encounter for immunization: Secondary | ICD-10-CM

## 2018-06-08 NOTE — Patient Instructions (Signed)
Your diabetes is still under good control on current regimen, butto get your A1c under 7.0 you need to add exercise .  I recommend going in the morning afer you drop your grandchildren off at work.   Your cholesterol is at goal on your current medication and your urinr is negative for proteinuria   I recommend that you continue to work on gettign some regular exercise,  Continue your current medications ,  follow a Low carb diet/lifestyle (most days!)  And follow up in  4  months .     I will place an order for Home health RN to see [name].

## 2018-06-08 NOTE — Progress Notes (Signed)
Subjective:  Patient ID: Robert Robertson., male    DOB: August 01, 1950  Age: 67 y.o. MRN: [id number]  CC: The primary encounter diagnosis was DM type 2, not at goal [facility]). Diagnoses of Encounter for immunization, Essential hypertension, and Pure hypercholesterolemia were also pertinent to this visit.  HPI [name]. presents for 4 month follow up on diabetes.  Patient has no complaints today.  Patient is following a low glycemic index diet and taking all prescribed medications regularly without side effects.  Fasting sugars have been under less than 140 most of the time and post prandials have been under 160 except on rare occasions. Patient is not exercising about 3 times per week and intentionally trying to lose weight .  Patient has had an eye exam in the last 12 months and checks feet regularly for signs of infection.  Patient does not walk barefoot outside,  And denies any numbness tingling or burning in feet. Patient is up to date on all recommended vaccinations  Lab Results  Component Value Date   HGBA1C 7.3 (H) 06/07/2018    Needs flu vaccine   Wife [name] is a challenge due to lack of personal hygiene and refusal to take meds .  Hiding  thm in her coffee. Outpatient Medications Prior to Visit  Medication Sig Dispense Refill  . aspirin 81 MG tablet Take 81 mg by mouth daily.    [name] atorvastatin (LIPITOR) 80 MG tablet TAKE 1 TABLET DAILY 90 tablet 1  . Blood Glucose Monitoring Suppl (FREESTYLE FREEDOM LITE) w/Device KIT 1 application by Does not apply route 2 (two) times daily. 1 each 0  . glucose blood (FREESTYLE LITE) test strip USE TO CHECK SUGARS TWICE A DAY 200 each 1  . Krill Oil 300 MG CAPS Take 1 capsule by mouth daily.    . Lancets (FREESTYLE) lancets CHECK SUGAR TWICE A DAY 200 each 0  . lisinopril-hydrochlorothiazide (PRINZIDE,ZESTORETIC) 20-25 MG tablet Take 1 tablet by mouth daily. 30 tablet 0  . metFORMIN (GLUCOPHAGE-XR) 500 MG 24 hr tablet TAKE 1 TABLET  DAILY WITH BREAKFAST 90 tablet 1  . Multiple Vitamins-Minerals (CENTRUM SILVER ADULT 50+ PO) Take 1 tablet by mouth daily.    . sitaGLIPtin (JANUVIA) 100 MG tablet Take 1 tablet (100 mg total) by mouth daily. 90 tablet 1   No facility-administered medications prior to visit.     Review of Systems;  Patient denies headache, fevers, malaise, unintentional weight loss, skin rash, eye pain, sinus congestion and sinus pain, sore throat, dysphagia,  hemoptysis , cough, dyspnea, wheezing, chest pain, palpitations, orthopnea, edema, abdominal pain, nausea, melena, diarrhea, constipation, flank pain, dysuria, hematuria, urinary  Frequency, nocturia, numbness, tingling, seizures,  Focal weakness, Loss of consciousness,  Tremor, insomnia, depression, anxiety, and suicidal ideation.      Objective:  BP 110/78 (BP Location: Left Arm, Patient Position: Sitting, Cuff Size: Large)   Pulse 80   Temp 98.3 F (36.8 C) (Oral)   Resp 15   Ht 5' 8"  (1.727 m)   Wt 195 lb 3.2 oz (88.5 kg)   SpO2 97%   BMI 29.68 kg/m   BP Readings from Last 3 Encounters:  06/08/18 110/78  02/10/18 130/81  02/03/18 (!) 142/84    Wt Readings from Last 3 Encounters:  06/08/18 195 lb 3.2 oz (88.5 kg)  02/10/18 199 lb 12.8 oz (90.6 kg)  02/03/18 194 lb 12.8 oz (88.4 kg)    General appearance: alert, cooperative and appears stated age  Ears: normal TM's and external ear canals both ears Throat: lips, mucosa, and tongue normal; teeth and gums normal Neck: no adenopathy, no carotid bruit, supple, symmetrical, trachea midline and thyroid not enlarged, symmetric, no tenderness/mass/nodules Back: symmetric, no curvature. ROM normal. No CVA tenderness. Lungs: clear to auscultation bilaterally Heart: regular rate and rhythm, S1, S2 normal, no murmur, click, rub or gallop Abdomen: soft, non-tender; bowel sounds normal; no masses,  no organomegaly Pulses: 2+ and symmetric Skin: Skin color, texture, turgor normal. No rashes or  lesions Lymph nodes: Cervical, supraclavicular, and axillary nodes normal.  Lab Results  Component Value Date   HGBA1C 7.3 (H) 06/07/2018   HGBA1C 7.2 (H) 01/31/2018   HGBA1C 7.5 (H) 10/05/2017    Lab Results  Component Value Date   CREATININE 0.88 06/07/2018   CREATININE 0.98 01/31/2018   CREATININE 0.91 10/05/2017    Lab Results  Component Value Date   WBC 5.4 06/07/2018   HGB 14.2 06/07/2018   HCT 43.6 06/07/2018   PLT 170.0 06/07/2018   GLUCOSE 135 (H) 06/07/2018   CHOL 147 06/07/2018   TRIG 67.0 06/07/2018   HDL 55.10 06/07/2018   LDLDIRECT 59.0 08/31/2016   LDLCALC 78 06/07/2018   ALT 38 06/07/2018   AST 28 06/07/2018   NA 136 06/07/2018   K 4.0 06/07/2018   CL 99 06/07/2018   CREATININE 0.88 06/07/2018   BUN 15 06/07/2018   CO2 30 06/07/2018   PSA 2.00 05/14/2014   INR 0.99 02/29/2012   HGBA1C 7.3 (H) 06/07/2018   MICROALBUR <0.7 06/07/2018    US Abdomen Complete W/elastography  Result Date: 03/11/2017 CLINICAL DATA:  [name] EXAM: ULTRASOUND ABDOMEN ULTRASOUND HEPATIC ELASTOGRAPHY TECHNIQUE: Sonography of the upper abdomen was performed. In addition, ultrasound elastography evaluation of the liver was performed. A region of interest was placed within the right lobe of the liver. Following application of a compressive sonographic pulse, shear waves were detected in the adjacent hepatic tissue and the shear wave velocity was calculated. Multiple assessments were performed at the selected site. Median shear wave velocity is correlated to a Metavir fibrosis score. COMPARISON:  12/05/2015 FINDINGS: ULTRASOUND ABDOMEN Gallbladder: No gallstones or wall thickening visualized. No sonographic Murphy sign noted by sonographer. Common bile duct: Diameter: Normal caliber, 4 mm Liver: Heterogeneous, increased echotexture throughout the liver compatible with fatty infiltration or intrinsic liver disease. 1.7 cm right hepatic cyst which appears benign. No suspicious focal bony  scratched at no suspicious focal abnormality. Portal vein is patent on color Doppler imaging with normal direction of blood flow towards the liver. IVC: No abnormality visualized. Pancreas: Visualized portion unremarkable. Spleen: Size and appearance within normal limits. Right Kidney: Length: 12.6 cm. Echogenicity within normal limits. No mass or hydronephrosis visualized. Left Kidney: Length: 11.8 cm. Echogenicity within normal limits. No mass or hydronephrosis visualized. Abdominal aorta: No aneurysm visualized. Other findings: None. ULTRASOUND HEPATIC ELASTOGRAPHY Device: Siemens Helix VTQ Patient position: Oblique Transducer 6C1 Number of measurements: 10 Hepatic segment:  8 Median velocity:   0.92  m/sec IQR: 0.17 IQR/Median velocity ratio: 0.18 Corresponding Metavir fibrosis score:  F0/F1 Risk of fibrosis: Minimal Limitations of exam: None Pertinent findings noted on other imaging exams:  None Please note that abnormal shear wave velocities may also be identified in clinical settings other than with hepatic fibrosis, such as: acute hepatitis, elevated right heart and central venous pressures including use of beta blockers, veno-occlusive disease (Budd-Chiari), infiltrative processes such as mastocytosis/amyloidosis/infiltrative tumor, extrahepatic cholestasis, in the post-prandial state, and liver  transplantation. Correlation with patient history, laboratory data, and clinical condition recommended. IMPRESSION: ULTRASOUND ABDOMEN: Increased, heterogeneous echotexture throughout the liver compatible with fatty infiltration or intrinsic liver disease. ULTRASOUND HEPATIC ELASTOGRAPHY: Median hepatic shear wave velocity is calculated at 0.92 m/sec. Corresponding Metavir fibrosis score is  F0/F1. Risk of fibrosis is Minimal. Follow-up: None required Electronically Signed   By: Rolm Baptise M.D.   On: 03/11/2017 10:14    Assessment & Plan:   Problem List Items Addressed This Visit    DM type 2, not at goal  [facility]) - Primary    A1c has improved since last visit with increase in  Januvia to 100 mg daily .  Advised to start going for a walk daily   Lab Results  Component Value Date   HGBA1C 7.3 (H) 06/07/2018   Lab Results  Component Value Date   MICROALBUR <0.7 06/07/2018         Relevant Orders   Comprehensive metabolic panel   Hemoglobin A1c   Essential hypertension    Well controlled on current regimen. Renal function stable, no changes today.  Lab Results  Component Value Date   CREATININE 0.88 06/07/2018   Lab Results  Component Value Date   NA 136 06/07/2018   K 4.0 06/07/2018   CL 99 06/07/2018   CO2 30 06/07/2018         Hyperlipidemia    LDL and triglycerides are at goal on current medications. He has no side effects and liver enzymes are normal. No changes today  Lab Results  Component Value Date   CHOL 147 06/07/2018   HDL 55.10 06/07/2018   LDLCALC 78 06/07/2018   LDLDIRECT 59.0 08/31/2016   TRIG 67.0 06/07/2018   CHOLHDL 3 06/07/2018   Lab Results  Component Value Date   ALT 38 06/07/2018   AST 28 06/07/2018   ALKPHOS 61 06/07/2018   BILITOT 0.7 06/07/2018            Other Visit Diagnoses    Encounter for immunization       Relevant Orders   Flu vaccine HIGH DOSE PF (Completed)    A total of 25 minutes of face to face time was spent with patient more than half of which was spent in counselling about the above mentioned conditions  and coordination of care   I am having Lynnette Caffey. maintain his aspirin, Astrid Drafts, Multiple Vitamins-Minerals (CENTRUM SILVER ADULT 50+ PO), FREESTYLE FREEDOM LITE, freestyle, metFORMIN, atorvastatin, glucose blood, sitaGLIPtin, and lisinopril-hydrochlorothiazide.  No orders of the defined types were placed in this encounter.   There are no discontinued medications.  Follow-up: Return in about 4 months (around 10/08/2018) for follow up diabetes.   [name], MD

## 2018-06-10 ENCOUNTER — Encounter: Payer: Self-pay | Admitting: Internal Medicine

## 2018-06-10 NOTE — Assessment & Plan Note (Signed)
Well controlled on current regimen. Renal function stable, no changes today.  Lab Results  Component Value Date   CREATININE 0.88 06/07/2018   Lab Results  Component Value Date   NA 136 06/07/2018   K 4.0 06/07/2018   CL 99 06/07/2018   CO2 30 06/07/2018

## 2018-06-10 NOTE — Assessment & Plan Note (Signed)
A1c has improved since last visit with increase in  Januvia to 100 mg daily .  Advised to start going for a walk daily   Lab Results  Component Value Date   HGBA1C 7.3 (H) 06/07/2018   Lab Results  Component Value Date   MICROALBUR <0.7 06/07/2018

## 2018-06-10 NOTE — Assessment & Plan Note (Signed)
LDL and triglycerides are at goal on current medications. He has no side effects and liver enzymes are normal. No changes today  Lab Results  Component Value Date   CHOL 147 06/07/2018   HDL 55.10 06/07/2018   LDLCALC 78 06/07/2018   LDLDIRECT 59.0 08/31/2016   TRIG 67.0 06/07/2018   CHOLHDL 3 06/07/2018   Lab Results  Component Value Date   ALT 38 06/07/2018   AST 28 06/07/2018   ALKPHOS 61 06/07/2018   BILITOT 0.7 06/07/2018

## 2018-06-12 NOTE — Telephone Encounter (Signed)
Sorry, I just saw this. I have sent this to [facility]. Please ignore the message I sent you earlier regarding her referral. Thanks! Melissa

## 2018-06-15 ENCOUNTER — Telehealth: Payer: Self-pay | Admitting: Internal Medicine

## 2018-06-16 NOTE — Telephone Encounter (Signed)
Pt stated that he would reach out to [facility] today.

## 2018-06-30 ENCOUNTER — Other Ambulatory Visit: Payer: Self-pay | Admitting: Internal Medicine

## 2018-06-30 ENCOUNTER — Telehealth: Payer: Self-pay | Admitting: Internal Medicine

## 2018-06-30 MED ORDER — ATORVASTATIN CALCIUM 80 MG PO TABS: 80.0000 mg | ORAL_TABLET | Freq: Every day | ORAL | 0 refills | Status: DC

## 2018-06-30 MED ORDER — ATORVASTATIN CALCIUM 80 MG PO TABS: 80.0000 mg | ORAL_TABLET | Freq: Every day | ORAL | 1 refills | Status: DC

## 2018-06-30 NOTE — Telephone Encounter (Signed)
Was trying to send 30 day supply of atorvastatin to local pharmacy and a [facility]. / Medication discontinued in error.

## 2018-06-30 NOTE — Telephone Encounter (Signed)
See previous encounter for today / Patient wanted 30 day supply of Atorvastatin sent in to his local [facility] and the 90 day supply [facility] / Medication discontinued in error /  Disp Refills Start End   atorvastatin (LIPITOR) 80 MG tablet 90 tablet 1 06/30/2018    Sig - Route: Take 1 tablet (80 mg total) by mouth daily. - Oral   Sent to pharmacy as: atorvastatin (LIPITOR) 80 MG tablet   E-Prescribing Status: Receipt confirmed by pharmacy (06/30/2018 2:37 PM EST)    [facility] /  Disp Refills Start End   atorvastatin (LIPITOR) 80 MG tablet 30 tablet 0 06/30/2018    Sig - Route: Take 1 tablet (80 mg total) by mouth daily. - Oral   Sent to pharmacy as: atorvastatin (LIPITOR) 80 MG tablet   E-Prescribing Status: Receipt confirmed by pharmacy (06/30/2018 2:43 PM EST)   Pharmacy   [facility], [place]

## 2018-06-30 NOTE — Telephone Encounter (Signed)
Copied from [place] [phone]. Topic: Quick Communication - Rx Refill/Question >> Jun 30, 2018 11:21 AM Reyne Dumas L wrote: Medication: atorvastatin (LIPITOR) 80 MG tablet  Has the patient contacted their pharmacy? Yes - states he let this mediation get to low (pt only has 2 pills left).  Pt would like a 30 day supply called into local pharmacy so he can get it and he also needs a 90 day supply sent in to mail order so he can go ahead and reorder (Agent: If no, request that the patient contact the pharmacy for the refill.) (Agent: If yes, when and what did the pharmacy advise?)  Preferred Pharmacy (with phone number or street name):  [facility] [address] [place] - [place] [phone] (Phone) [phone] (Fax) [place], [place] [phone] (Phone) [phone] (Fax)  Agent: Please be advised that RX refills may take up to 3 business days. We ask that you follow-up with your pharmacy.

## 2018-07-12 ENCOUNTER — Other Ambulatory Visit: Payer: Self-pay | Admitting: Internal Medicine

## 2018-07-19 ENCOUNTER — Emergency Department
Admission: EM | Admit: 2018-07-19 | Discharge: 2018-07-20 | Disposition: A | Discharge status: Home / Self Care | Payer: Medicare Other | Attending: Emergency Medicine | Admitting: Emergency Medicine

## 2018-07-19 ENCOUNTER — Encounter: Payer: Self-pay | Admitting: Emergency Medicine

## 2018-07-19 ENCOUNTER — Other Ambulatory Visit: Payer: Self-pay

## 2018-07-19 DIAGNOSIS — E1121 Type 2 diabetes mellitus with diabetic nephropathy: Secondary | ICD-10-CM | POA: Insufficient documentation

## 2018-07-19 DIAGNOSIS — Z7984 Long term (current) use of oral hypoglycemic drugs: Secondary | ICD-10-CM | POA: Diagnosis not present

## 2018-07-19 DIAGNOSIS — Z7982 Long term (current) use of aspirin: Secondary | ICD-10-CM | POA: Diagnosis not present

## 2018-07-19 DIAGNOSIS — Z87891 Personal history of nicotine dependence: Secondary | ICD-10-CM | POA: Insufficient documentation

## 2018-07-19 DIAGNOSIS — I10 Essential (primary) hypertension: Secondary | ICD-10-CM | POA: Diagnosis not present

## 2018-07-19 DIAGNOSIS — Z79899 Other long term (current) drug therapy: Secondary | ICD-10-CM | POA: Diagnosis not present

## 2018-07-19 DIAGNOSIS — R040 Epistaxis: Secondary | ICD-10-CM | POA: Insufficient documentation

## 2018-07-19 MED ORDER — OXYMETAZOLINE HCL 0.05 % NA SOLN
1.0000 | Freq: Once | NASAL | Status: AC
  Administered 2018-07-19: 1 via NASAL

## 2018-07-19 NOTE — ED Provider Notes (Signed)
[facility] Emergency Department Provider Note  ____________________________________________   First MD Initiated Contact with Patient 07/19/18 2305     (approximate)  I have reviewed the triage vital signs and the nursing notes.   HISTORY  Chief Complaint Epistaxis   HPI Robert Robertson. is a 68 y.o. male who self presents to the emergency department with 1 hour of bleeding from his right nare.  He takes aspirin daily but no other blood thinning medications.  His symptoms came on suddenly were moderate severity and seem to have stopped with pressure.  He said he pinched his nose and tilted his head back which made him somewhat nauseated and coughing he felt like he had to vomit.  He denies trauma.  He said he did not pick his nose.    Past Medical History:  Diagnosis Date  . Allergy   . Colon polyp   . Diabetes mellitus   . Hyperlipidemia   . Hypertension     Patient Active Problem List   Diagnosis Date Noted  . Onychomycosis of multiple toenails with type 2 diabetes mellitus ([place]) 02/05/2018  . Diabetic nephropathy associated with type 2 diabetes mellitus ([place]) 07/10/2017  . Facial lesion 07/10/2017  . Spouse of alcoholic 12/22/9483  . Recurrent epistaxis 11/14/2015  . Hepatic steatosis 01/28/2015  . Obesity (BMI 30-39.9) 08/09/2014  . Diverticulosis of colon without hemorrhage 08/07/2014  . Encounter for screening colonoscopy 06/03/2014  . Hyperlipidemia 05/21/2014  . Essential hypertension 05/11/2014  . DM type 2, not at goal [facility]) 05/11/2014  . Tubular adenoma of colon 05/11/2014  . History of tobacco abuse 05/11/2014    Past Surgical History:  Procedure Laterality Date  . COLONOSCOPY  2010   10 mm adenomatous polyp in the descending colon without atypia, [name], MD    Prior to Admission medications   Medication Sig Start Date End Date Taking? Authorizing Provider  aspirin 81 MG tablet Take 81 mg by mouth daily.     [provider]  atorvastatin (LIPITOR) 80 MG tablet Take 1 tablet (80 mg total) by mouth daily. 06/30/18   [name], MD  Blood Glucose Monitoring Suppl (FREESTYLE FREEDOM LITE) w/Device KIT 1 application by Does not apply route 2 (two) times daily. 09/16/16   [name], MD  glucose blood (FREESTYLE LITE) test strip USE TO CHECK SUGARS TWICE A DAY 12/12/17   [name], MD  Krill Oil 300 MG CAPS Take 1 capsule by mouth daily.    [provider]  Lancets (FREESTYLE) lancets CHECK SUGAR TWICE A DAY 05/23/17   [name], MD  lisinopril-hydrochlorothiazide (PRINZIDE,ZESTORETIC) 20-25 MG tablet Take 1 tablet by mouth daily. 03/27/18   [name], MD  metFORMIN (GLUCOPHAGE-XR) 500 MG 24 hr tablet TAKE 1 TABLET DAILY WITH BREAKFAST 07/12/18   [name], MD  Multiple Vitamins-Minerals (CENTRUM SILVER ADULT 50+ PO) Take 1 tablet by mouth daily.    [provider]  sitaGLIPtin (JANUVIA) 100 MG tablet Take 1 tablet (100 mg total) by mouth daily. 03/24/18   [name], MD    Allergies Pollen extract  Family History  Problem Relation Age of Onset  . Diabetes Mother   . Stroke Mother   . Diabetes Brother   . Diabetes Maternal Grandfather   . Diabetes Maternal Grandmother   . Cancer Sister        brain tumor    Social History Social History   Tobacco  Use  . Smoking status: Former Smoker    Packs/day: 1.00    Years: 30.00    Pack years: 30.00    Types: Cigarettes  . Smokeless tobacco: Never Used  Substance Use Topics  . Alcohol use: Yes    Alcohol/week: 1.0 standard drinks    Types: 1 Glasses of wine per week    Comment: daily  . Drug use: No    Review of Systems Constitutional: No fever/chills ENT: Positive for epistaxis Cardiovascular: Denies chest pain. Respiratory: Denies shortness of breath. Gastrointestinal: No abdominal pain.  Positive for nausea, no vomiting.   Neurological: Negative for  headaches   ____________________________________________   PHYSICAL EXAM:  VITAL SIGNS: ED Triage Vitals  Enc Vitals Group     BP 07/19/18 2302 (!) 155/90     Pulse Rate 07/19/18 2302 94     Resp 07/19/18 2302 20     Temp 07/19/18 2302 97.8 F (36.6 C)     Temp Source 07/19/18 2302 Oral     SpO2 07/19/18 2302 99 %     Weight 07/19/18 2259 195 lb (88.5 kg)     Height 07/19/18 2259 5' 9"  (1.753 m)     Head Circumference --      Peak Flow --      Pain Score --      Pain Loc --      Pain Edu? --      Excl. in [place]? --     Constitutional: Alert and oriented x4 nontoxic no diaphoresis speaks in full clear sentences Head: Atraumatic. Nose: Evidence of recent anterior bleed on the right Mouth/Throat: No trismus Neck: No stridor.   Cardiovascular: Regular rate and rhythm Respiratory: Normal respiratory effort.  No retractions. Gastrointestinal: Soft nontender Neurologic:  Normal speech and language. No gross focal neurologic deficits are appreciated.  Skin:  Skin is warm, dry and intact. No rash noted.    ____________________________________________  LABS (all labs ordered are listed, but only abnormal results are displayed)  Labs Reviewed - No data to display   __________________________________________  EKG   ____________________________________________  RADIOLOGY   ____________________________________________   DIFFERENTIAL includes but not limited to  Anterior epistaxis, posterior epistaxis   PROCEDURES  Procedure(s) performed: o  Procedures  Critical Care performed: no  ____________________________________________   INITIAL IMPRESSION / ASSESSMENT AND PLAN / ED COURSE  Pertinent labs & imaging results that were available during my care of the patient were reviewed by me and considered in my medical decision making (see chart for details).   As part of my medical decision making, I reviewed the following data within the electronic medical  record:  History obtained from family if available, nursing notes, old chart and ekg, as well as notes from prior ED visits.  By the time I saw the patient is bleeding and stopped.  We discussed the proper way to stop epistaxis.  Given Afrin for home.  Strict return precautions have been given.  No evidence of posterior bleed.      ____________________________________________   FINAL CLINICAL IMPRESSION(S) / ED DIAGNOSES  Final diagnoses:  Acute anterior epistaxis      NEW MEDICATIONS STARTED DURING THIS VISIT:  Discharge Medication List as of 07/20/2018 12:09 AM       Note:  This document was prepared using Dragon voice recognition software and may include unintentional dictation errors.     Darel Hong, MD 07/23/18 [phone]

## 2018-07-19 NOTE — ED Triage Notes (Signed)
Patient ambulatory to triage with steady gait, without difficulty or distress noted; pt with bleeding from right nare x hour; denies any accomp symptoms; st hx of same with "dry nose"

## 2018-07-20 NOTE — Discharge Instructions (Signed)
It was a pleasure to take care of you today, and thank you for coming to our emergency department.  If you have any questions or concerns before leaving please ask the nurse to grab me and I'm more than happy to go through your aftercare instructions again. ° °If you have any concerns once you are home that you are not improving or are in fact getting worse before you can make it to your follow-up appointment, please do not hesitate to call 911 and come back for further evaluation. ° °Kelii Chittum, MD ° ° ° °

## 2018-10-09 ENCOUNTER — Other Ambulatory Visit (INDEPENDENT_AMBULATORY_CARE_PROVIDER_SITE_OTHER): Payer: Medicare Other

## 2018-10-09 ENCOUNTER — Other Ambulatory Visit: Payer: Self-pay

## 2018-10-09 DIAGNOSIS — R040 Epistaxis: Secondary | ICD-10-CM | POA: Diagnosis not present

## 2018-10-09 DIAGNOSIS — E119 Type 2 diabetes mellitus without complications: Secondary | ICD-10-CM

## 2018-10-09 DIAGNOSIS — K760 Fatty (change of) liver, not elsewhere classified: Secondary | ICD-10-CM

## 2018-10-09 LAB — COMPREHENSIVE METABOLIC PANEL
ALT: 35 U/L (ref 0–53)
AST: 27 U/L (ref 0–37)
Albumin: 4.4 g/dL (ref 3.5–5.2)
Alkaline Phosphatase: 65 U/L (ref 39–117)
BUN: 15 mg/dL (ref 6–23)
CO2: 30 mEq/L (ref 19–32)
Calcium: 9.2 mg/dL (ref 8.4–10.5)
Chloride: 100 mEq/L (ref 96–112)
Creatinine, Ser: 0.89 mg/dL (ref 0.40–1.50)
GFR: 102.89 mL/min (ref >60.00)
Glucose, Bld: 131 mg/dL — ABNORMAL HIGH (ref 70–99)
Potassium: 4.1 mEq/L (ref 3.5–5.1)
Sodium: 138 mEq/L (ref 135–145)
Total Bilirubin: 0.9 mg/dL (ref 0.2–1.2)
Total Protein: 7 g/dL (ref 6.0–8.3)

## 2018-10-09 LAB — PROTIME-INR
INR: 1.1 ratio — ABNORMAL HIGH (ref 0.8–1.0)
Prothrombin Time: 12.4 s (ref 9.6–13.1)

## 2018-10-09 LAB — HEMOGLOBIN A1C: Hgb A1c MFr Bld: 7.5 % — ABNORMAL HIGH (ref 4.6–6.5)

## 2018-10-11 ENCOUNTER — Ambulatory Visit (INDEPENDENT_AMBULATORY_CARE_PROVIDER_SITE_OTHER): Payer: Medicare Other | Admitting: Internal Medicine

## 2018-10-11 DIAGNOSIS — Z7189 Other specified counseling: Secondary | ICD-10-CM | POA: Diagnosis not present

## 2018-10-11 DIAGNOSIS — E7800 Pure hypercholesterolemia, unspecified: Secondary | ICD-10-CM | POA: Diagnosis not present

## 2018-10-11 DIAGNOSIS — K760 Fatty (change of) liver, not elsewhere classified: Secondary | ICD-10-CM

## 2018-10-11 DIAGNOSIS — E119 Type 2 diabetes mellitus without complications: Secondary | ICD-10-CM

## 2018-10-11 DIAGNOSIS — I10 Essential (primary) hypertension: Secondary | ICD-10-CM

## 2018-10-11 DIAGNOSIS — E1121 Type 2 diabetes mellitus with diabetic nephropathy: Secondary | ICD-10-CM | POA: Diagnosis not present

## 2018-10-11 MED ORDER — TELMISARTAN 40 MG PO TABS: 40.0000 mg | ORAL_TABLET | Freq: Every day | ORAL | 1 refills | Status: DC

## 2018-10-11 MED ORDER — HYDROCHLOROTHIAZIDE 25 MG PO TABS: 25.0000 mg | ORAL_TABLET | Freq: Every day | ORAL | 3 refills | Status: DC

## 2018-10-11 NOTE — Progress Notes (Signed)
Virtual Visit via [email or web address] Note  This visit type was conducted due to national recommendations for restrictions regarding the COVID-19 pandemic (e.g. social distancing).  This format is felt to be most appropriate for this patient at this time.  All issues noted in this document were discussed and addressed.  No physical exam was performed (except for noted visual exam findings with Video Visits).   I connected with@ on 10/13/18 at  9:30 AM EDT by a video enabled telemedicine application or telephone and verified that I am speaking with the correct person using two identifiers. Location patient: home Location provider: work or home office Persons participating in the virtual visit: patient, provider  I discussed the limitations, risks, security and privacy concerns of performing an evaluation and management service by telephone and the availability of in person appointments. I also discussed with the patient that there may be a patient responsible charge related to this service. The patient expressed understanding and agreed to proceed.   Reason for visit: follow up  On type 2 DM,  With fatty liver, obesity hypertension and hyperlipidemia   HPI:  4 month follow up on diabetes.  Patient has no complaints today. Had an ER visit in January for nosebleed.  Bleeding had stopped by  time of evaluation,   He was educated on proper way to tamponade bleeding and given Afrin . He has been moisturizing his nose daily since then and has had no subsequent bleeds.   Recent Labs reviewed,  a1c slightly higher at 7.3 . Attributes the increase to altered lifestyle over the past 3  months due to mother's decline and death.  He and his siblings had been keeping a bedside vigil  24/7 for their mother after she suffered a cerebral hemorrhage after a fall.  She was  1 and died on  09/24/22.  He Has not been exercising or following low GI diet due to the vigil he kept. Patient  has had an eye exam in the last 12 months  and checks feet regularly for signs of infection.  Patient does not walk barefoot outside,  And denies any numbness tingling or burning in feet. Patient is up to date on all recommended vaccinations  Hypertension: patient checks blood pressure twice weekly at home.  Readings have been for the most part < 130/80 at rest . Patient is following a reduced salt diet most days and is taking medications as prescribed  COVID-19 Education: The signs and symptoms of COVID-19 were discussed with the patient and how to seek care for testing (follow up with PCP or arrange E-visit).  The importance of social distancing was discussed today.  He has been sheltering at home and wearing a mask when he does leave the house.    ROS: See pertinent positives and negatives per HPI.  Past Medical History:  Diagnosis Date  . Allergy   . Colon polyp   . Diabetes mellitus   . Hyperlipidemia   . Hypertension     Past Surgical History:  Procedure Laterality Date  . COLONOSCOPY  2010   10 mm adenomatous polyp in the descending colon without atypia, [name], MD    Family History  Problem Relation Age of Onset  . Diabetes Mother   . Stroke Mother   . Diabetes Brother   . Diabetes Maternal Grandfather   . Diabetes Maternal Grandmother   . 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  IADL  Current Outpatient Medications:  .  aspirin 81 MG tablet, Take 81 mg by mouth daily., Disp: , Rfl:  .  Blood Glucose Monitoring Suppl (FREESTYLE FREEDOM LITE) w/Device KIT, 1 application by Does not apply route 2 (two) times daily., Disp: 1 each, Rfl: 0 .  glucose blood (FREESTYLE LITE) test strip, USE TO CHECK SUGARS TWICE A DAY, Disp: 200 each, Rfl: 1 .  Krill Oil 300 MG CAPS, Take 1 capsule by mouth daily., Disp: , Rfl:  .  Lancets (FREESTYLE) lancets, CHECK SUGAR TWICE A DAY, Disp: 200 each, Rfl: 0 .  metFORMIN (GLUCOPHAGE-XR) 500 MG 24 hr tablet, TAKE 1  TABLET DAILY WITH BREAKFAST, Disp: 90 tablet, Rfl: 4 .  Multiple Vitamins-Minerals (CENTRUM SILVER ADULT 50+ PO), Take 1 tablet by mouth daily., Disp: , Rfl:  .  sitaGLIPtin (JANUVIA) 100 MG tablet, Take 1 tablet (100 mg total) by mouth daily., Disp: 90 tablet, Rfl: 1 .  atorvastatin (LIPITOR) 80 MG tablet, Take 1 tablet (80 mg total) by mouth daily., Disp: 90 tablet, Rfl: 1 .  hydrochlorothiazide (HYDRODIURIL) 25 MG tablet, Take 1 tablet (25 mg total) by mouth daily., Disp: 90 tablet, Rfl: 3 .  telmisartan (MICARDIS) 40 MG tablet, Take 1 tablet (40 mg total) by mouth at bedtime., Disp: 90 tablet, Rfl: 1  EXAM:  VITALS per patient if applicable:  GENERAL: alert, oriented, appears well and in no acute distress  HEENT: atraumatic, conjunttiva clear, no obvious abnormalities on inspection of external nose and ears  NECK: normal movements of the head and neck  LUNGS: on inspection no signs of respiratory distress, breathing rate appears normal, no obvious gross SOB, gasping or wheezing  CV: no obvious cyanosis  MS: moves all visible extremities without noticeable abnormality  PSYCH/NEURO: pleasant and cooperative, no obvious depression or anxiety, speech and thought processing grossly intact  ASSESSMENT AND PLAN:  Discussed the following assessment and plan:  Educated About Covid-19 Virus Infection The patient has no signs or symptoms of COVID 19 infection (fever, cough and shortness of breath). The signs and symptoms of COVID-19 were discussed with the patient and how to seek care for testing (follow up with PCP or arrange E-visit).  The importance of social distancing was discussed today  Hyperlipidemia LDL and triglycerides were at goal in December on atorvastatin . He has no side effects and liver enzymes are normal. No changes today  Lab Results  Component Value Date   CHOL 147 06/07/2018   HDL 55.10 06/07/2018   LDLCALC 78 06/07/2018   LDLDIRECT 59.0 08/31/2016   TRIG 67.0  06/07/2018   CHOLHDL 3 06/07/2018   Lab Results  Component Value Date   ALT 35 10/09/2018   AST 27 10/09/2018   ALKPHOS 65 10/09/2018   BILITOT 0.9 10/09/2018       Essential hypertension Well controlled on lisinopril/hct.  renal function stable. I am making a decision to change patient's ACE Inhibitor to an ARB  based on increased reports of  angioedema.  I also advised patient to to take it at night instead of morning,  as recent studies have shown a reduction in incidence of heart attacks and strokes.   Lab Results  Component Value Date   CREATININE 0.89 10/09/2018   Lab Results  Component Value Date   NA 138 10/09/2018   K 4.1 10/09/2018   CL 100 10/09/2018   CO2 30 10/09/2018     Diabetic  nephropathy associated with type 2 diabetes mellitus ([place]) changing lisinopril to telmisartan  Lab Results  Component Value Date   MICROALBUR <0.7 06/07/2018     DM type 2, not at goal [facility]) A1c has risen due to lifestyle changes which were transient .  continue Januvia at  100 mg daily and metformin 500 mg once daily and encouraged to  start going for a walk daily   Lab Results  Component Value Date   HGBA1C 7.5 (H) 10/09/2018   Lab Results  Component Value Date   MICROALBUR <0.7 06/07/2018      Updated Medication List Outpatient Encounter Medications as of 10/11/2018  Medication Sig  . aspirin 81 MG tablet Take 81 mg by mouth daily.  . Blood Glucose Monitoring Suppl (FREESTYLE FREEDOM LITE) w/Device KIT 1 application by Does not apply route 2 (two) times daily.  [name] glucose blood (FREESTYLE LITE) test strip USE TO CHECK SUGARS TWICE A DAY  . Krill Oil 300 MG CAPS Take 1 capsule by mouth daily.  . Lancets (FREESTYLE) lancets CHECK SUGAR TWICE A DAY  . metFORMIN (GLUCOPHAGE-XR) 500 MG 24 hr tablet TAKE 1 TABLET DAILY WITH BREAKFAST  . Multiple Vitamins-Minerals (CENTRUM SILVER ADULT 50+ PO) Take 1 tablet by mouth daily.  . sitaGLIPtin (JANUVIA) 100 MG tablet Take 1  tablet (100 mg total) by mouth daily.  . [DISCONTINUED] atorvastatin (LIPITOR) 80 MG tablet Take 1 tablet (80 mg total) by mouth daily.  . [DISCONTINUED] lisinopril-hydrochlorothiazide (PRINZIDE,ZESTORETIC) 20-25 MG tablet Take 1 tablet by mouth daily.  . hydrochlorothiazide (HYDRODIURIL) 25 MG tablet Take 1 tablet (25 mg total) by mouth daily.  [name] telmisartan (MICARDIS) 40 MG tablet Take 1 tablet (40 mg total) by mouth at bedtime.   No facility-administered encounter medications on file as of 10/11/2018.       I discussed the assessment and treatment plan with the patient. The patient was provided an opportunity to ask questions and all were answered. The patient agreed with the plan and demonstrated an understanding of the instructions.   The patient was advised to call back or seek an in-person evaluation if the symptoms worsen or if the condition fails to improve as anticipated.  I provided 25 minutes of non-face-to-face time during this encounter.   [name], MD

## 2018-10-11 NOTE — Patient Instructions (Addendum)
Good to see you!  Please let me know if you have trouble recovering from the loss of your mother.   To recap our visit:  I am not worried about your diabetes management given what has transpired over the last several months,,  But let's repeat your A1c and fasting lipids in early July     I discussed with you  My  decision to change your lisinopril to telmisartan  based on increased case  reports of patients  developing tongue and throat swelling from lisinopril.  The condition , called "angioedema," can be fatal if a person's airway is compromised.  I also want you to take the telmisartan  at night instead of morning,  And continue the hctz in the morning.  This is recommended as a result of recent studies have shown that taking your blood pressure medications at night protects you better from heart attacks and strokes.   Both medications were sent to [facility].  Continue lisinopril hct until  You receive them.  See you in early July

## 2018-10-12 ENCOUNTER — Other Ambulatory Visit: Payer: Self-pay

## 2018-10-12 MED ORDER — ATORVASTATIN CALCIUM 80 MG PO TABS: 80.0000 mg | ORAL_TABLET | Freq: Every day | ORAL | 1 refills | Status: DC

## 2018-10-13 DIAGNOSIS — Z7189 Other specified counseling: Secondary | ICD-10-CM | POA: Insufficient documentation

## 2018-10-13 NOTE — Assessment & Plan Note (Addendum)
Well controlled on lisinopril/hct.  renal function stable. I am making a decision to change patient's ACE Inhibitor to an ARB  based on increased reports of  angioedema.  I also advised patient to to take it at night instead of morning,  as recent studies have shown a reduction in incidence of heart attacks and strokes.   Lab Results  Component Value Date   CREATININE 0.89 10/09/2018   Lab Results  Component Value Date   NA 138 10/09/2018   K 4.1 10/09/2018   CL 100 10/09/2018   CO2 30 10/09/2018

## 2018-10-13 NOTE — Assessment & Plan Note (Signed)
The patient has no signs or symptoms of COVID 19 infection (fever, cough and shortness of breath). The signs and symptoms of COVID-19 were discussed with the patient and how to seek care for testing (follow up with PCP or arrange E-visit).  The importance of social distancing was discussed today

## 2018-10-13 NOTE — Assessment & Plan Note (Signed)
LDL and triglycerides were at goal in December on atorvastatin . He has no side effects and liver enzymes are normal. No changes today  Lab Results  Component Value Date   CHOL 147 06/07/2018   HDL 55.10 06/07/2018   LDLCALC 78 06/07/2018   LDLDIRECT 59.0 08/31/2016   TRIG 67.0 06/07/2018   CHOLHDL 3 06/07/2018   Lab Results  Component Value Date   ALT 35 10/09/2018   AST 27 10/09/2018   ALKPHOS 65 10/09/2018   BILITOT 0.9 10/09/2018

## 2018-10-13 NOTE — Assessment & Plan Note (Signed)
A1c has risen due to lifestyle changes which were transient .  continue Januvia at  100 mg daily and metformin 500 mg once daily and encouraged to  start going for a walk daily   Lab Results  Component Value Date   HGBA1C 7.5 (H) 10/09/2018   Lab Results  Component Value Date   MICROALBUR <0.7 06/07/2018

## 2018-10-13 NOTE — Assessment & Plan Note (Signed)
changing lisinopril to telmisartan  Lab Results  Component Value Date   MICROALBUR <0.7 06/07/2018

## 2018-11-30 ENCOUNTER — Other Ambulatory Visit: Payer: Self-pay | Admitting: Internal Medicine

## 2018-12-05 DIAGNOSIS — H40003 Preglaucoma, unspecified, bilateral: Secondary | ICD-10-CM | POA: Diagnosis not present

## 2018-12-05 LAB — HM DIABETES EYE EXAM

## 2018-12-21 DIAGNOSIS — K760 Fatty (change of) liver, not elsewhere classified: Secondary | ICD-10-CM | POA: Diagnosis not present

## 2018-12-28 ENCOUNTER — Other Ambulatory Visit: Payer: Self-pay | Admitting: Nurse Practitioner

## 2018-12-28 DIAGNOSIS — K760 Fatty (change of) liver, not elsewhere classified: Secondary | ICD-10-CM

## 2019-01-05 ENCOUNTER — Telehealth: Payer: Self-pay

## 2019-01-05 NOTE — Telephone Encounter (Signed)
Copied from [phone]. Topic: Quick Communication - Appointment Cancellation >> Jan 05, 2019  4:18 PM Celene Kras A wrote: Patient called to cancel appointment scheduled for 01/09/2019 and 01/11/2019. Patient has not rescheduled their appointment. Pt is requesting to get a call back to reschedule.   Route to department's PEC pool.

## 2019-01-05 NOTE — Telephone Encounter (Signed)
Appointments cancelled.  Called pt to reschedule as requested.  No answer.  LMTCB.    Will forward to scheduler to call back.

## 2019-01-09 ENCOUNTER — Other Ambulatory Visit: Payer: Medicare Other

## 2019-01-11 ENCOUNTER — Ambulatory Visit: Payer: Medicare Other | Admitting: Internal Medicine

## 2019-01-16 ENCOUNTER — Other Ambulatory Visit (INDEPENDENT_AMBULATORY_CARE_PROVIDER_SITE_OTHER): Payer: Medicare Other

## 2019-01-16 ENCOUNTER — Ambulatory Visit
Admission: RE | Admit: 2019-01-16 | Discharge: 2019-01-16 | Disposition: A | Discharge status: Home / Self Care | Payer: Medicare Other | Source: Ambulatory Visit | Attending: Nurse Practitioner | Admitting: Nurse Practitioner

## 2019-01-16 ENCOUNTER — Other Ambulatory Visit: Payer: Self-pay

## 2019-01-16 DIAGNOSIS — K760 Fatty (change of) liver, not elsewhere classified: Secondary | ICD-10-CM

## 2019-01-16 DIAGNOSIS — E119 Type 2 diabetes mellitus without complications: Secondary | ICD-10-CM | POA: Diagnosis not present

## 2019-01-16 DIAGNOSIS — E1121 Type 2 diabetes mellitus with diabetic nephropathy: Secondary | ICD-10-CM | POA: Diagnosis not present

## 2019-01-16 LAB — COMPREHENSIVE METABOLIC PANEL
ALT: 35 U/L (ref 0–53)
AST: 30 U/L (ref 0–37)
Albumin: 4.5 g/dL (ref 3.5–5.2)
Alkaline Phosphatase: 62 U/L (ref 39–117)
BUN: 16 mg/dL (ref 6–23)
CO2: 25 mEq/L (ref 19–32)
Calcium: 9.3 mg/dL (ref 8.4–10.5)
Chloride: 102 mEq/L (ref 96–112)
Creatinine, Ser: 0.92 mg/dL (ref 0.40–1.50)
GFR: 98.95 mL/min (ref >60.00)
Glucose, Bld: 133 mg/dL — ABNORMAL HIGH (ref 70–99)
Potassium: 3.9 mEq/L (ref 3.5–5.1)
Sodium: 138 mEq/L (ref 135–145)
Total Bilirubin: 1.2 mg/dL (ref 0.2–1.2)
Total Protein: 6.8 g/dL (ref 6.0–8.3)

## 2019-01-16 LAB — MICROALBUMIN / CREATININE URINE RATIO
Creatinine,U: 275.5 mg/dL
Microalb Creat Ratio: 0.7 mg/g (ref 0.0–30.0)
Microalb, Ur: 2 mg/dL — ABNORMAL HIGH (ref 0.0–1.9)

## 2019-01-16 LAB — LIPID PANEL
Cholesterol: 136 mg/dL (ref 0–200)
HDL: 56.6 mg/dL (ref >39.00)
LDL Cholesterol: 58 mg/dL (ref 0–99)
NonHDL: 79.16
Total CHOL/HDL Ratio: 2
Triglycerides: 107 mg/dL (ref 0.0–149.0)
VLDL: 21.4 mg/dL (ref 0.0–40.0)

## 2019-01-16 LAB — HEMOGLOBIN A1C: Hgb A1c MFr Bld: 7.3 % — ABNORMAL HIGH (ref 4.6–6.5)

## 2019-01-16 IMAGING — US ULTRASOUND ABDOMEN LIMITED
1 series · 14 of 25 positions shown · non-contrast
Comparison: Abdomen ultrasound dated [DATE].

CLINICAL DATA: Fatty liver, hypertension, diabetes.

EXAM:
ULTRASOUND ABDOMEN LIMITED RIGHT UPPER QUADRANT

[Series 1: ultrasound abdomen limited · 0.19mm/px · 14 of 44 slices shown]
[im 1/44]
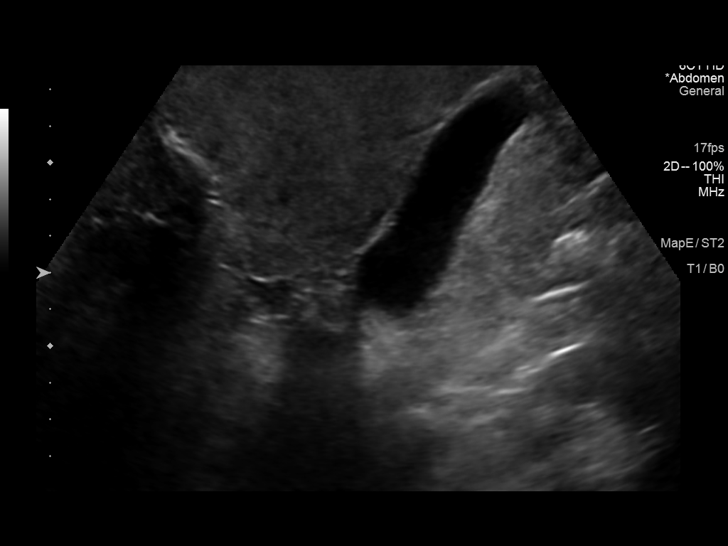
[im 4/44]
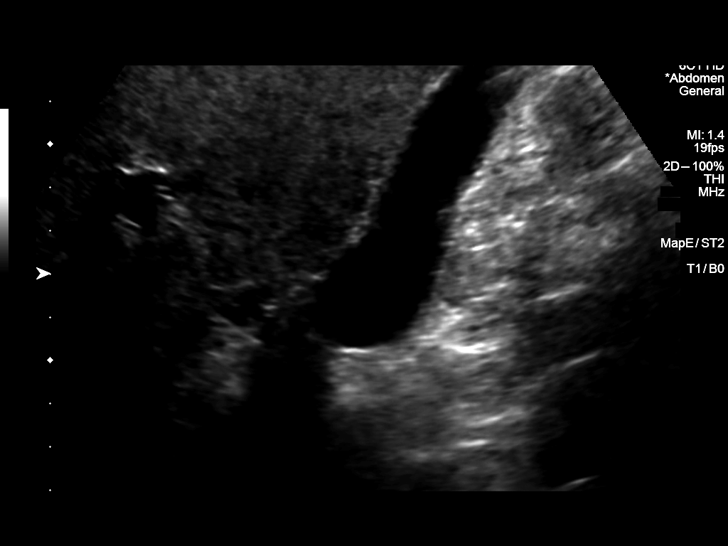
[im 8/44]
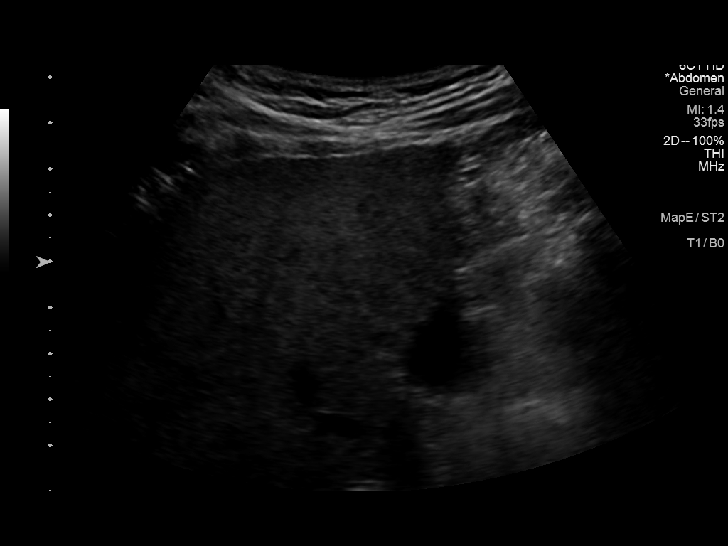
[im 11/44]
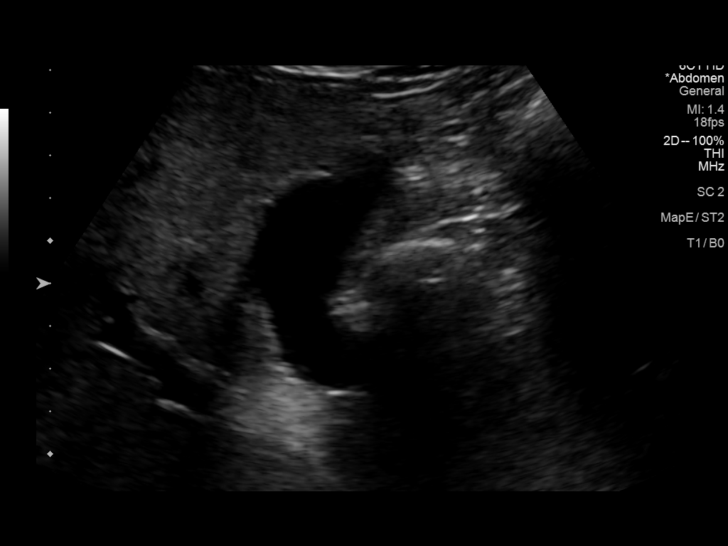
[im 15/44]
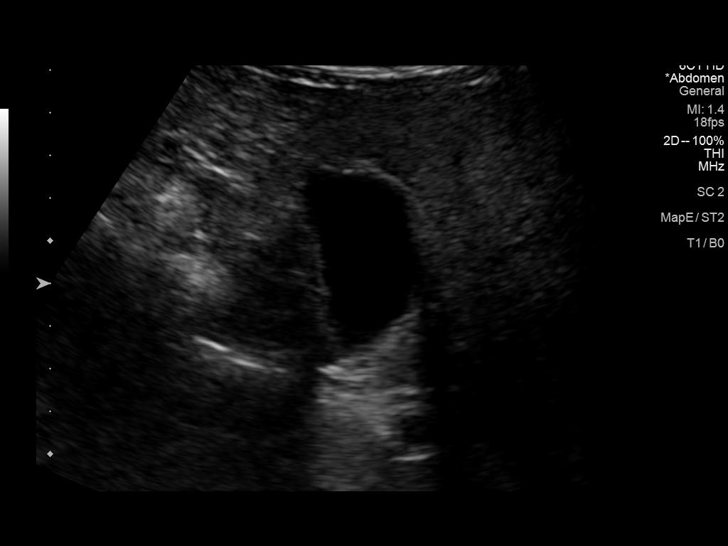
[im 17/44]
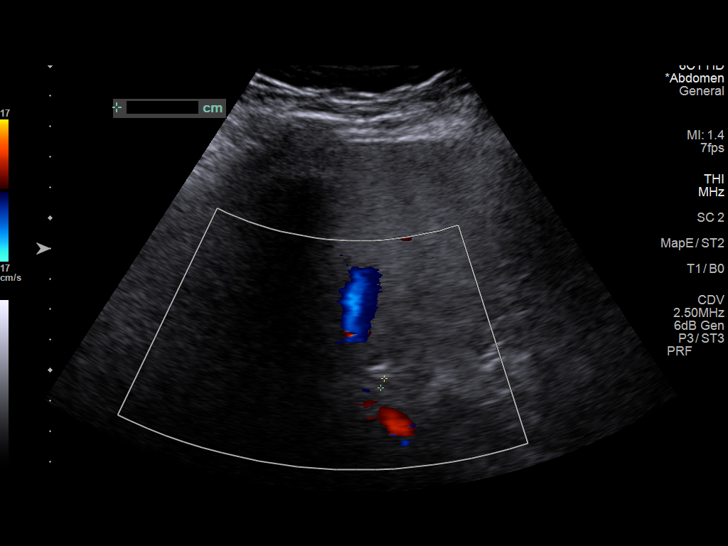
[im 20/44]
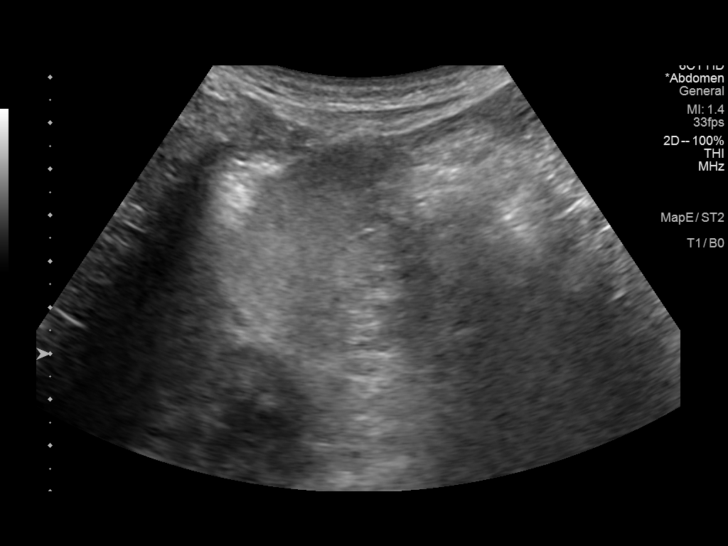
[im 24/44]
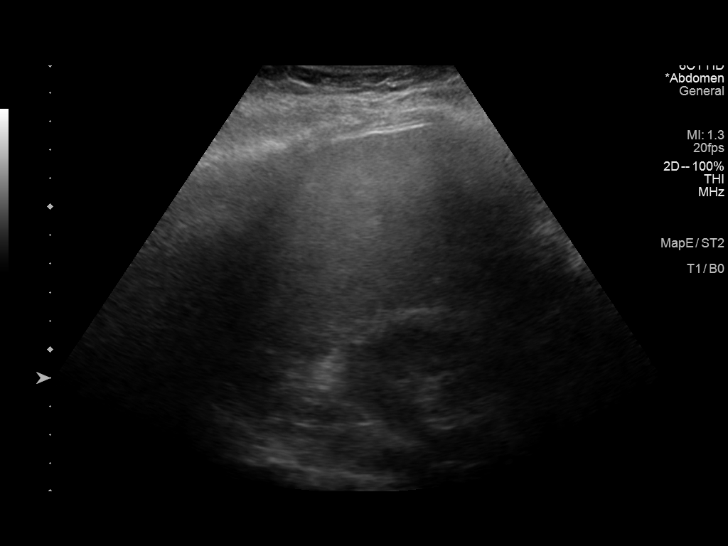
[im 27/44]
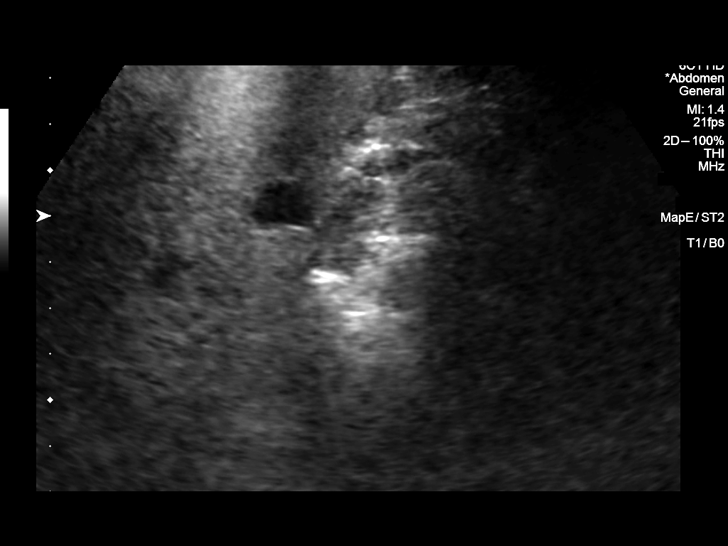
[im 29/44]
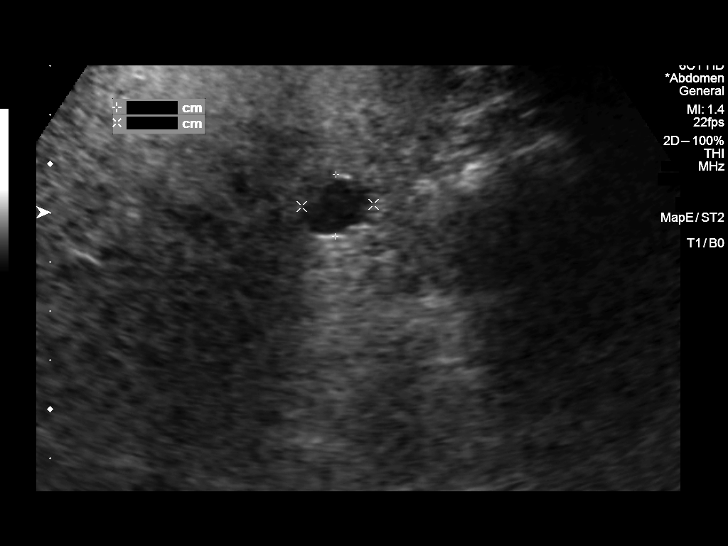
[im 33/44]
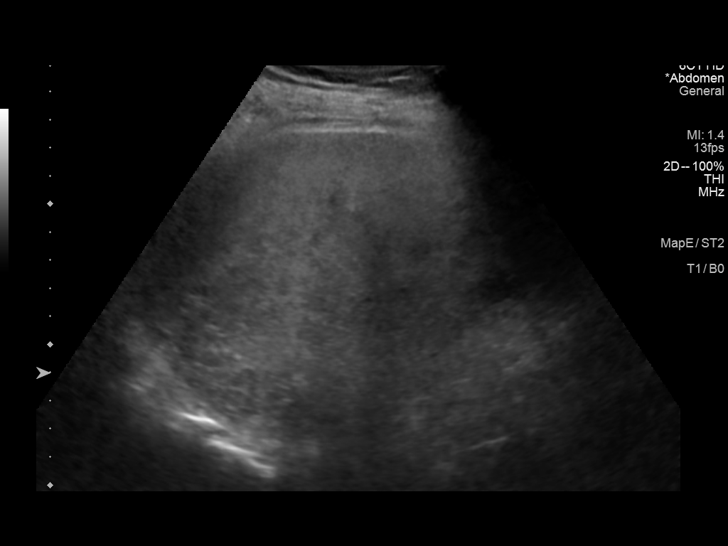
[im 36/44]
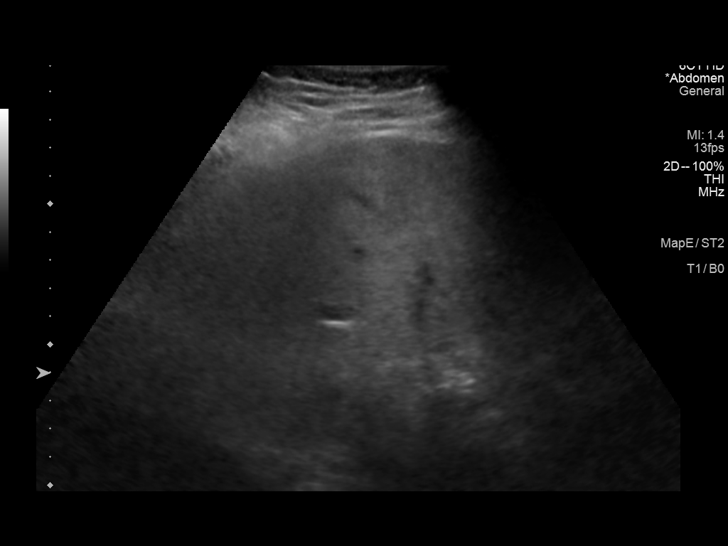
[im 40/44]
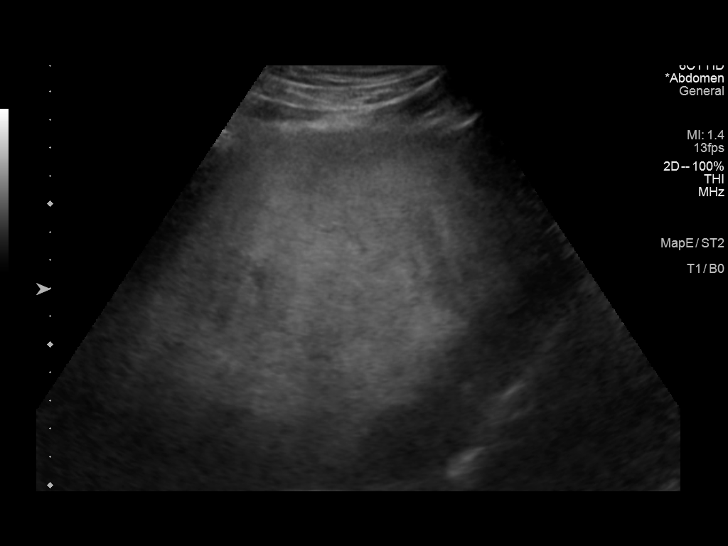
[im 44/44]
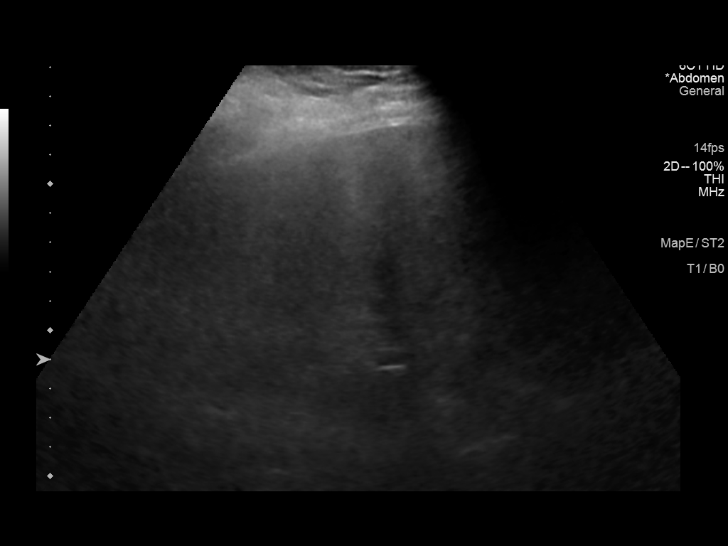

[14 of 25 positions shown; findings below may reference images not displayed]

FINDINGS: Gallbladder:

No gallstones or wall thickening visualized. No sonographic Murphy
sign noted by sonographer.

Common bile duct:

Diameter: 3 mm

Liver:

Liver is diffusely echogenic compatible with the given history of
fatty infiltration. Cyst in the RIGHT hepatic lobe measures 1.5 cm.
No acute or suspicious liver findings. Portal vein is patent on
color Doppler imaging with normal direction of blood flow towards
the liver.
IMPRESSION: 1. Fatty infiltration of the liver.
2. No acute findings.

## 2019-01-18 ENCOUNTER — Telehealth: Payer: Self-pay | Admitting: Internal Medicine

## 2019-01-18 NOTE — Telephone Encounter (Signed)
My Chart message sent

## 2019-01-19 ENCOUNTER — Encounter: Payer: Self-pay | Admitting: General Surgery

## 2019-01-26 ENCOUNTER — Encounter: Payer: Self-pay | Admitting: Internal Medicine

## 2019-01-26 ENCOUNTER — Ambulatory Visit (INDEPENDENT_AMBULATORY_CARE_PROVIDER_SITE_OTHER): Payer: Medicare Other | Admitting: Internal Medicine

## 2019-01-26 ENCOUNTER — Other Ambulatory Visit: Payer: Self-pay

## 2019-01-26 VITALS — Wt 193.0 lb

## 2019-01-26 DIAGNOSIS — E119 Type 2 diabetes mellitus without complications: Secondary | ICD-10-CM | POA: Diagnosis not present

## 2019-01-26 DIAGNOSIS — Z125 Encounter for screening for malignant neoplasm of prostate: Secondary | ICD-10-CM

## 2019-01-26 DIAGNOSIS — K760 Fatty (change of) liver, not elsewhere classified: Secondary | ICD-10-CM

## 2019-01-26 DIAGNOSIS — D126 Benign neoplasm of colon, unspecified: Secondary | ICD-10-CM | POA: Diagnosis not present

## 2019-01-26 DIAGNOSIS — E1121 Type 2 diabetes mellitus with diabetic nephropathy: Secondary | ICD-10-CM

## 2019-01-26 DIAGNOSIS — E7800 Pure hypercholesterolemia, unspecified: Secondary | ICD-10-CM | POA: Diagnosis not present

## 2019-01-26 MED ORDER — METFORMIN HCL 500 MG PO TABS: 500.0000 mg | ORAL_TABLET | Freq: Two times a day (BID) | ORAL | 3 refills | Status: DC

## 2019-01-26 NOTE — Progress Notes (Signed)
Virtual Visit via [email or web address]  This visit type was conducted due to national recommendations for restrictions regarding the COVID-19 pandemic (e.g. social distancing).  This format is felt to be most appropriate for this patient at this time.  All issues noted in this document were discussed and addressed.  No physical exam was performed (except for noted visual exam findings with Video Visits).   I connected with@ on 01/26/19 at  3:00 PM EDT by a video enabled telemedicine application or telephone and verified that I am speaking with the correct person using two identifiers. Location patient: home Location provider: work or home office Persons participating in the virtual visit: patient, provider  I discussed the limitations, risks, security and privacy concerns of performing an evaluation and management service by telephone and the availability of in person appointments. I also discussed with the patient that there may be a patient responsible charge related to this service. The patient expressed understanding and agreed to proceed.  Reason for visit: follow upon type 2 DM, hypertension and hyperlipidemia  HPI:  68 yr old male with T2DM , HTN hyperlipdemia presents for follow up .    He feels great.  Staying home,  Getting ready to cut off the bear he started growing when the COVID 19 activity restrictionsbega.  The patient has no signs or symptoms of COVID 19 infection (fever, cough, sore throat  or shortness of breath beyond what is typical for patient).  Patient denies contact with other persons with the above mentioned symptoms or with anyone confirmed to have COVID 19 .  3 month follow up on diabetes.  Patient has no complaints today.  Patient is following a low glycemic index diet and taking all prescribed medications regularly without side effects.  Fasting sugars have been under less than 140 most of the time and post prandials have been under 160 except on rare occasions. Patient is  exercising about 3 times per week and intentionally trying to lose weight .  Patient has had an eye exam in the last 12 months and checks feet regularly for signs of infection.  Patient does not walk barefoot outside,  And denies an numbness tingling or burning in feet. Patient is up to date on all recommended vaccinations.    ROS: See pertinent positives and negatives per HPI.  Past Medical History:  Diagnosis Date  . Allergy   . Colon polyp   . Diabetes mellitus   . Hyperlipidemia   . Hypertension     Past Surgical History:  Procedure Laterality Date  . COLONOSCOPY  2010   10 mm adenomatous polyp in the descending colon without atypia, [name], MD    Family History  Problem Relation Age of Onset  . Diabetes Mother   . Stroke Mother   . Diabetes Brother   . Diabetes Maternal Grandfather   . Diabetes Maternal Grandmother   . Cancer Sister        brain tumor    SOCIAL HX:  reports that he has quit smoking. His smoking use included cigarettes. He has a 30.00 pack-year smoking history. He has never used smokeless tobacco. He reports current alcohol use of about 1.0 standard drinks of alcohol per week. He reports that he does not use drugs.   Current Outpatient Medications:  .  aspirin 81 MG tablet, Take 81 mg by mouth daily., Disp: , Rfl:  .  atorvastatin (LIPITOR) 80 MG tablet, Take 1 tablet (80 mg total) by mouth daily., Disp: 90 tablet,  Rfl: 1 .  Blood Glucose Monitoring Suppl (FREESTYLE FREEDOM LITE) w/Device KIT, 1 application by Does not apply route 2 (two) times daily., Disp: 1 each, Rfl: 0 .  glucose blood (FREESTYLE LITE) test strip, USE TO CHECK SUGARS TWICE A DAY, Disp: 200 each, Rfl: 1 .  hydrochlorothiazide (HYDRODIURIL) 25 MG tablet, Take 1 tablet (25 mg total) by mouth daily., Disp: 90 tablet, Rfl: 3 .  JANUVIA 100 MG tablet, TAKE 1 TABLET DAILY, Disp: 90 tablet, Rfl: 1 .  Krill Oil 300 MG CAPS, Take 1 capsule by mouth daily., Disp: , Rfl:  .  Lancets  (FREESTYLE) lancets, CHECK SUGAR TWICE A DAY, Disp: 200 each, Rfl: 0 .  Multiple Vitamins-Minerals (CENTRUM SILVER ADULT 50+ PO), Take 1 tablet by mouth daily., Disp: , Rfl:  .  telmisartan (MICARDIS) 40 MG tablet, Take 1 tablet (40 mg total) by mouth at bedtime., Disp: 90 tablet, Rfl: 1 .  metFORMIN (GLUCOPHAGE) 500 MG tablet, Take 1 tablet (500 mg total) by mouth 2 (two) times daily with a meal., Disp: 180 tablet, Rfl: 3  EXAM:  VITALS per patient if applicable:  GENERAL: alert, oriented, appears well and in no acute distress  HEENT: atraumatic, conjunttiva clear, no obvious abnormalities on inspection of external nose and ears  NECK: normal movements of the head and neck  LUNGS: on inspection no signs of respiratory distress, breathing rate appears normal, no obvious gross SOB, gasping or wheezing  CV: no obvious cyanosis  MS: moves all visible extremities without noticeable abnormality  PSYCH/NEURO: pleasant and cooperative, no obvious depression or anxiety, speech and thought processing grossly intact  ASSESSMENT AND PLAN: Prostate cancer screening He has not had a PSA or other screening for prostate CA since 2015.  Medicare PSA has been ordered.   Hyperlipidemia LDL and triglycerides are at goal on atorvastatin . He has no side effects and liver enzymes are normal. No changes today  Lab Results  Component Value Date   CHOL 136 01/16/2019   HDL 56.60 01/16/2019   LDLCALC 58 01/16/2019   LDLDIRECT 59.0 08/31/2016   TRIG 107.0 01/16/2019   CHOLHDL 2 01/16/2019   Lab Results  Component Value Date   ALT 35 01/16/2019   AST 30 01/16/2019   ALKPHOS 62 01/16/2019   BILITOT 1.2 01/16/2019       Diabetic nephropathy associated with type 2 diabetes mellitus ([place]) Tolerating change from lisinopril to telmisartan  Lab Results  Component Value Date   MICROALBUR 2.0 (H) 01/16/2019     DM type 2, not at goal ([place]) A1c has improved  .  continue Januvia at  100 mg  daily and metformin 500 mg once daily and encouraged to start going for a walk daily   Lab Results  Component Value Date   HGBA1C 7.3 (H) 01/16/2019   Lab Results  Component Value Date   MICROALBUR 2.0 (H) 01/16/2019       I discussed the assessment and treatment plan with the patient. The patient was provided an opportunity to ask questions and all were answered. The patient agreed with the plan and demonstrated an understanding of the instructions.   The patient was advised to call back or seek an in-person evaluation if the symptoms worsen or if the condition fails to improve as anticipated.  I provided 25 minutes of non-face-to-face time during this encounter.   [name], MD

## 2019-01-28 DIAGNOSIS — Z125 Encounter for screening for malignant neoplasm of prostate: Secondary | ICD-10-CM | POA: Insufficient documentation

## 2019-01-28 NOTE — Assessment & Plan Note (Signed)
He has not had a PSA or other screening for prostate CA since 2015.  Medicare PSA has been ordered.

## 2019-01-28 NOTE — Assessment & Plan Note (Signed)
A1c has improved  .  continue Januvia at  100 mg daily and metformin 500 mg once daily and encouraged to start going for a walk daily   Lab Results  Component Value Date   HGBA1C 7.3 (H) 01/16/2019   Lab Results  Component Value Date   MICROALBUR 2.0 (H) 01/16/2019

## 2019-01-28 NOTE — Assessment & Plan Note (Signed)
LDL and triglycerides are at goal on atorvastatin . He has no side effects and liver enzymes are normal. No changes today  Lab Results  Component Value Date   CHOL 136 01/16/2019   HDL 56.60 01/16/2019   LDLCALC 58 01/16/2019   LDLDIRECT 59.0 08/31/2016   TRIG 107.0 01/16/2019   CHOLHDL 2 01/16/2019   Lab Results  Component Value Date   ALT 35 01/16/2019   AST 30 01/16/2019   ALKPHOS 62 01/16/2019   BILITOT 1.2 01/16/2019

## 2019-01-28 NOTE — Assessment & Plan Note (Signed)
Tolerating change from lisinopril to telmisartan  Lab Results  Component Value Date   MICROALBUR 2.0 (H) 01/16/2019

## 2019-02-05 ENCOUNTER — Ambulatory Visit (INDEPENDENT_AMBULATORY_CARE_PROVIDER_SITE_OTHER): Payer: Medicare Other

## 2019-02-05 ENCOUNTER — Other Ambulatory Visit: Payer: Self-pay

## 2019-02-05 ENCOUNTER — Ambulatory Visit: Payer: Medicare Other | Admitting: Internal Medicine

## 2019-02-05 DIAGNOSIS — Z0000 Encounter for general adult medical examination without abnormal findings: Secondary | ICD-10-CM

## 2019-02-05 NOTE — Patient Instructions (Addendum)
  [name] , Thank you for taking time to come for your Medicare Wellness Visit. I appreciate your ongoing commitment to your health goals. Please review the following plan we discussed and let me know if I can assist you in the future.   These are the goals we discussed: Goals      Patient Stated   . Follow up with Primary Care Provider (pt-stated)     Blood pressure home monitoring (spot check) Lower A1C        This is a list of the screening recommended for you and due dates:  Health Maintenance  Topic Date Due  . Flu Shot  01/27/2019  . Pneumonia vaccines (2 of 2 - PPSV23) 05/09/2019  . Complete foot exam   06/09/2019  . Colon Cancer Screening  06/27/2019  . Hemoglobin A1C  07/19/2019  . Eye exam for diabetics  12/05/2019  . Tetanus Vaccine  08/07/2024  .  Hepatitis C: One time screening is recommended by Center for Disease Control  (CDC) for  adults born from [birth year] through 1965.   Completed

## 2019-02-05 NOTE — Progress Notes (Addendum)
Subjective:   Robert Robertson. is a 68 y.o. male who presents for Medicare Annual/Subsequent preventive examination.  Review of Systems:  No ROS.  Medicare Wellness Virtual Visit.  Visual/audio telehealth visit, UTA vital signs.   See social history for additional risk factors.   Cardiac Risk Factors include: advanced age (>56mn, >>67women);male gender;hypertension;diabetes mellitus     Objective:    Vitals: There were no vitals taken for this visit.  There is no height or weight on file to calculate BMI.  Advanced Directives 02/05/2019 07/19/2018 01/26/2018 12/28/2016  Does Patient Have a Medical Advance Directive? Yes No No No  Type of Advance Directive [place] Does patient want to make changes to medical advance directive? No - Patient declined - - -  [place]? No - copy requested - - -  Would patient like information on creating a medical advance directive? - No - Patient declined No - Patient declined Yes (MAU/Ambulatory/Procedural Areas - Information given)    Tobacco Social History   Tobacco Use  Smoking Status Former Smoker  . Packs/day: 1.00  . Years: 30.00  . Pack years: 30.00  . Types: Cigarettes  Smokeless Tobacco Never Used     Counseling given: Not Answered   Clinical Intake:  Pre-visit preparation completed: Yes        Diabetes: Yes(Followed by pcp) CBG done?: No Did pt. bring in CBG monitor from home?: No  How often do you need to have someone help you when you read instructions, pamphlets, or other written materials from your doctor or pharmacy?: 1 - Never  Interpreter Needed?: No     Past Medical History:  Diagnosis Date  . Allergy   . Colon polyp   . Diabetes mellitus   . Hyperlipidemia   . Hypertension    Past Surgical History:  Procedure Laterality Date  . COLONOSCOPY  2010   10 mm adenomatous polyp in the descending colon without atypia, DLucilla Lame MD    Family History  Problem Relation Age of Onset  . Diabetes Mother   . Stroke Mother   . Colon cancer Mother   . Diabetes Brother   . Diabetes Maternal Grandfather   . Diabetes Maternal Grandmother   . Cancer Sister        brain tumor   Social History   Socioeconomic History  . Marital status: Married    Spouse name: Not on file  . Number of children: Not on file  . Years of education: Not on file  . Highest education level: Not on file  Occupational History  . Not on file  Social Needs  . Financial resource strain: Not hard at all  . Food insecurity    Worry: Never true    Inability: Never true  . Transportation needs    Medical: No    Non-medical: No  Tobacco Use  . Smoking status: Former Smoker    Packs/day: 1.00    Years: 30.00    Pack years: 30.00    Types: Cigarettes  . Smokeless tobacco: Never Used  Substance and Sexual Activity  . Alcohol use: Yes    Alcohol/week: 1.0 standard drinks    Types: 1 Glasses of wine per week    Comment: daily  . Drug use: No  . Sexual activity: Not on file  Lifestyle  . Physical activity    Days per week: 0 days  Minutes per session: Not on file  . Stress: Not at all  Relationships  . Social Herbalist on phone: Not on file    Gets together: Not on file    Attends religious service: Not on file    Active member of club or organization: Not on file    Attends meetings of clubs or organizations: Not on file    Relationship status: Not on file  Other Topics Concern  . Not on file  Social History Narrative  . Not on file    Outpatient Encounter Medications as of 02/05/2019  Medication Sig  . aspirin 81 MG tablet Take 81 mg by mouth daily.  [name] atorvastatin (LIPITOR) 80 MG tablet Take 1 tablet (80 mg total) by mouth daily.  . Blood Glucose Monitoring Suppl (FREESTYLE FREEDOM LITE) w/Device KIT 1 application by Does not apply route 2 (two) times daily.  [name] glucose blood (FREESTYLE LITE) test strip USE TO CHECK  SUGARS TWICE A DAY  . hydrochlorothiazide (HYDRODIURIL) 25 MG tablet Take 1 tablet (25 mg total) by mouth daily.  [name] JANUVIA 100 MG tablet TAKE 1 TABLET DAILY  . Krill Oil 300 MG CAPS Take 1 capsule by mouth daily.  . Lancets (FREESTYLE) lancets CHECK SUGAR TWICE A DAY  . metFORMIN (GLUCOPHAGE) 500 MG tablet Take 1 tablet (500 mg total) by mouth 2 (two) times daily with a meal.  . Multiple Vitamins-Minerals (CENTRUM SILVER ADULT 50+ PO) Take 1 tablet by mouth daily.  [name] telmisartan (MICARDIS) 40 MG tablet Take 1 tablet (40 mg total) by mouth at bedtime.   No facility-administered encounter medications on file as of 02/05/2019.     Activities of Daily Living In your present state of health, do you have any difficulty performing the following activities: 02/05/2019  Hearing? N  Vision? N  Difficulty concentrating or making decisions? N  Walking or climbing stairs? N  Dressing or bathing? N  Doing errands, shopping? N  Preparing Food and eating ? N  Using the Toilet? N  In the past six months, have you accidently leaked urine? N  Do you have problems with loss of bowel control? N  Managing your Medications? N  Managing your Finances? N  Housekeeping or managing your Housekeeping? N  Some recent data might be hidden    Patient Care Team: [name], MD as PCP - General (Internal Medicine) [name], MD (Internal Medicine) Bary Castilla Forest Gleason, MD (General Surgery)   Assessment:   This is a routine wellness examination for Robert Robertson.  I connected with patient 02/05/19 at  9:30 AM EDT by an audio enabled telemedicine application and verified that I am speaking with the correct person using two identifiers. Patient stated full name and DOB. Patient gave permission to continue with virtual visit. Patient's location was at home and Nurse's location was at [place] office.   Health Screenings  Colonoscopy - 05/2014 Hepatitis C Screening- 04/2015 Glaucoma -suspect Hearing -demonstrates  normal hearing during visit. Hemoglobin A1C - 12/2018 (7.3) Dental- UTD Vision- visits within the last 12 months. No retinopathy reported.   Social  Alcohol intake - yes      Smoking history- former  Smokers in home? none Illicit drug use? none Physical activity- yard work. No routine.  Diet - low carb BMI- discussed the importance of a healthy diet, water intake and the benefits of aerobic exercise.  Educational material provided.   Safety  Patient feels safe at home- yes Patient  does have smoke detectors at home- yes Patient does wear sunscreen or protective clothing when in direct sunlight -yes Patient does wear seat belt when in a moving vehicle -yes Patient drives- yes  [id number] precautions and sickness symptoms discussed.   Activities of Daily Living Patient denies needing assistance with: driving, household chores, feeding themselves, getting from bed to chair, getting to the toilet, bathing/showering, dressing, managing money, or preparing meals.  No new identified risk were noted.    Depression Screen Patient denies losing interest in daily life, feeling hopeless, or crying easily over simple problems.   Medication-taking as directed and without issues.   Fall Screen Patient denies being afraid of falling or falling in the last year.   Memory Screen Patient is alert.  Patient denies difficulty focusing, concentrating or misplacing items. Correctly identified the president of the [place], season and recall 5/5. Patient likes to play word games on his ipad for brain stimulation.  Immunizations The following Immunizations were discussed: Influenza, shingles, pneumonia, and tetanus.   Other Providers Patient Care Team: [name], MD as PCP - General (Internal Medicine) [name], MD (Internal Medicine) Bary Castilla Forest Gleason, MD (General Surgery)   Exercise Activities and Dietary recommendations Current Exercise Habits: The patient does not participate in  regular exercise at present, Intensity: Mild  Goals      Patient Stated   . Follow up with Primary Care Provider (pt-stated)     Blood pressure home monitoring (spot check) Lower A1C        Fall Risk Fall Risk  02/05/2019 02/10/2018 02/03/2018 01/26/2018 12/28/2016  Falls in the past year? 0 No No No No   Is the patient's home free of loose throw rugs in walkways, pet beds, electrical cords, etc?  yes       Grab bars in the bathroom? yes      Handrails on the stairs? yes      Adequate lighting? yes  Depression Screen PHQ 2/9 Scores 02/05/2019 02/10/2018 01/26/2018 12/28/2016  PHQ - 2 Score 0 0 0 0    Cognitive Function MMSE - Mini Mental State Exam 01/26/2018 12/28/2016  Orientation to time 5 5  Orientation to Place 5 5  Registration 3 3  Attention/ Calculation 5 5  Recall 3 3  Language- name 2 objects 2 2  Language- repeat 1 1  Language- follow 3 step command 3 3  Language- read & follow direction 1 1  Write a sentence 1 1  Copy design 1 1  Total score 30 30     6CIT Screen 02/05/2019  What Year? 0 points  What month? 0 points  What time? 0 points  Count back from 20 0 points  Months in reverse 0 points  Repeat phrase 0 points  Total Score 0    Immunization History  Administered Date(s) Administered  . Hep A / Hep B 05/07/2015, 06/10/2015, 11/04/2015  . Influenza Split 04/28/2013  . Influenza, High Dose Seasonal PF 02/27/2016, 07/07/2017, 06/08/2018  . Influenza,inj,Quad PF,6+ Mos 05/08/2014, 05/07/2015  . Pneumococcal Conjugate-13 08/15/2015  . Pneumococcal Polysaccharide-23 05/08/2014  . Tdap 08/07/2014  . Zoster 02/27/2016   Screening Tests Health Maintenance  Topic Date Due  . INFLUENZA VACCINE  01/27/2019  . PNA vac Low Risk Adult (2 of 2 - PPSV23) 05/09/2019  . FOOT EXAM  06/09/2019  . COLONOSCOPY  06/27/2019  . HEMOGLOBIN A1C  07/19/2019  . OPHTHALMOLOGY EXAM  12/05/2019  . TETANUS/TDAP  08/07/2024  . Hepatitis C  Screening  Completed       Plan:     End of life planning; Advance aging; Advanced directives discussed.  Copy of current HCPOA/Living Will requested.    Schedule lab and follow up with pcp as previously directed.   I have personally reviewed and noted the following in the patient's chart:   . Medical and social history . Use of alcohol, tobacco or illicit drugs  . Current medications and supplements . Functional ability and status . Nutritional status . Physical activity . Advanced directives . List of other physicians . Hospitalizations, surgeries, and ER visits in previous 12 months . Vitals . Screenings to include cognitive, depression, and falls . Referrals and appointments  In addition, I have reviewed and discussed with patient certain preventive protocols, quality metrics, and best practice recommendations. A written personalized care plan for preventive services as well as general preventive health recommendations were provided to patient.     OBrien-Blaney, Russell Quinney L, LPN  4/38/3779   I have reviewed the above information and agree with above.   Deborra Medina, MD

## 2019-02-07 ENCOUNTER — Encounter: Payer: Self-pay | Admitting: *Deleted

## 2019-03-06 ENCOUNTER — Encounter: Payer: Self-pay | Admitting: *Deleted

## 2019-04-17 ENCOUNTER — Other Ambulatory Visit: Payer: Self-pay | Admitting: Internal Medicine

## 2019-04-18 DIAGNOSIS — Z03818 Encounter for observation for suspected exposure to other biological agents ruled out: Secondary | ICD-10-CM | POA: Diagnosis not present

## 2019-05-13 ENCOUNTER — Other Ambulatory Visit: Payer: Self-pay | Admitting: Internal Medicine

## 2019-05-29 ENCOUNTER — Other Ambulatory Visit: Payer: Self-pay | Admitting: Internal Medicine

## 2019-06-05 DIAGNOSIS — H40003 Preglaucoma, unspecified, bilateral: Secondary | ICD-10-CM | POA: Diagnosis not present

## 2019-06-05 DIAGNOSIS — E119 Type 2 diabetes mellitus without complications: Secondary | ICD-10-CM | POA: Diagnosis not present

## 2019-07-03 ENCOUNTER — Other Ambulatory Visit: Payer: Self-pay

## 2019-07-03 ENCOUNTER — Other Ambulatory Visit (INDEPENDENT_AMBULATORY_CARE_PROVIDER_SITE_OTHER): Payer: Medicare Other

## 2019-07-03 DIAGNOSIS — E119 Type 2 diabetes mellitus without complications: Secondary | ICD-10-CM | POA: Diagnosis not present

## 2019-07-03 DIAGNOSIS — Z23 Encounter for immunization: Secondary | ICD-10-CM | POA: Diagnosis not present

## 2019-07-03 DIAGNOSIS — Z125 Encounter for screening for malignant neoplasm of prostate: Secondary | ICD-10-CM

## 2019-07-03 DIAGNOSIS — E7800 Pure hypercholesterolemia, unspecified: Secondary | ICD-10-CM

## 2019-07-03 DIAGNOSIS — K760 Fatty (change of) liver, not elsewhere classified: Secondary | ICD-10-CM | POA: Diagnosis not present

## 2019-07-03 LAB — LIPID PANEL
Cholesterol: 179 mg/dL (ref 0–200)
HDL: 63.9 mg/dL (ref >39.00)
LDL Cholesterol: 93 mg/dL (ref 0–99)
NonHDL: 115.56
Total CHOL/HDL Ratio: 3
Triglycerides: 113 mg/dL (ref 0.0–149.0)
VLDL: 22.6 mg/dL (ref 0.0–40.0)

## 2019-07-03 LAB — COMPREHENSIVE METABOLIC PANEL
ALT: 42 U/L (ref 0–53)
AST: 28 U/L (ref 0–37)
Albumin: 4.5 g/dL (ref 3.5–5.2)
Alkaline Phosphatase: 69 U/L (ref 39–117)
BUN: 13 mg/dL (ref 6–23)
CO2: 29 mEq/L (ref 19–32)
Calcium: 9.9 mg/dL (ref 8.4–10.5)
Chloride: 98 mEq/L (ref 96–112)
Creatinine, Ser: 0.96 mg/dL (ref 0.40–1.50)
GFR: 94.07 mL/min (ref >60.00)
Glucose, Bld: 159 mg/dL — ABNORMAL HIGH (ref 70–99)
Potassium: 4.2 mEq/L (ref 3.5–5.1)
Sodium: 137 mEq/L (ref 135–145)
Total Bilirubin: 1 mg/dL (ref 0.2–1.2)
Total Protein: 7.3 g/dL (ref 6.0–8.3)

## 2019-07-03 LAB — HEMOGLOBIN A1C: Hgb A1c MFr Bld: 7.6 % — ABNORMAL HIGH (ref 4.6–6.5)

## 2019-07-03 LAB — PSA, MEDICARE: PSA: 2.48 ng/ml (ref 0.10–4.00)

## 2019-07-06 ENCOUNTER — Ambulatory Visit (INDEPENDENT_AMBULATORY_CARE_PROVIDER_SITE_OTHER): Payer: Medicare Other | Admitting: Internal Medicine

## 2019-07-06 ENCOUNTER — Other Ambulatory Visit: Payer: Self-pay

## 2019-07-06 ENCOUNTER — Encounter: Payer: Self-pay | Admitting: Internal Medicine

## 2019-07-06 VITALS — Ht 69.0 in | Wt 193.0 lb

## 2019-07-06 DIAGNOSIS — I10 Essential (primary) hypertension: Secondary | ICD-10-CM | POA: Diagnosis not present

## 2019-07-06 DIAGNOSIS — E1121 Type 2 diabetes mellitus with diabetic nephropathy: Secondary | ICD-10-CM | POA: Diagnosis not present

## 2019-07-06 DIAGNOSIS — R972 Elevated prostate specific antigen [PSA]: Secondary | ICD-10-CM | POA: Diagnosis not present

## 2019-07-06 DIAGNOSIS — E119 Type 2 diabetes mellitus without complications: Secondary | ICD-10-CM

## 2019-07-06 DIAGNOSIS — E7800 Pure hypercholesterolemia, unspecified: Secondary | ICD-10-CM

## 2019-07-06 DIAGNOSIS — D126 Benign neoplasm of colon, unspecified: Secondary | ICD-10-CM

## 2019-07-06 MED ORDER — BLOOD GLUCOSE METER KIT: PACK | 0 refills | Status: DC

## 2019-07-06 MED ORDER — FREESTYLE LITE TEST VI STRP: ORAL_STRIP | 1 refills | Status: DC

## 2019-07-06 NOTE — Patient Instructions (Signed)
increase your metformin to twice daily'  Take your telmisartan with the metformin at or after dinner  Urology referral for increase in psa  RN visit for bp check and pneumonia vaccine

## 2019-07-06 NOTE — Assessment & Plan Note (Signed)
Due to medication lapse,  Not taking metformin bid,  Continue januvia,  Increase metformin to bid, and start exercising.  Glucometer send to [employer]   Lab Results  Component Value Date   HGBA1C 7.6 (H) 07/03/2019

## 2019-07-06 NOTE — Assessment & Plan Note (Signed)
Needs rn visit for bp check.  continue telmisartan and hct   Lab Results  Component Value Date   CREATININE 0.96 07/03/2019   Lab Results  Component Value Date   NA 137 07/03/2019   K 4.2 07/03/2019   CL 98 07/03/2019   CO2 29 07/03/2019

## 2019-07-06 NOTE — Assessment & Plan Note (Signed)
Tolerating change from lisinopril to telmisartan  Lab Results  Component Value Date   MICROALBUR 2.0 (H) 01/16/2019

## 2019-07-06 NOTE — Assessment & Plan Note (Signed)
Recommend urology evaluation given rise in PSA for 2.0 to 2.5   Lab Results  Component Value Date   PSA 2.48 07/03/2019   PSA 2.00 05/14/2014

## 2019-07-06 NOTE — Progress Notes (Signed)
Virtual Visit via [email or web address]  This visit type was conducted due to national recommendations for restrictions regarding the COVID-19 pandemic (e.g. social distancing).  This format is felt to be most appropriate for this patient at this time.  All issues noted in this document were discussed and addressed.  No physical exam was performed (except for noted visual exam findings with Video Visits).   I connected with@ on 07/06/19 at  8:30 AM EST by a video enabled telemedicine application  and verified that I am speaking with the correct person using two identifiers. Location patient: home Location provider: work or home office Persons participating in the virtual visit: patient, provider  I discussed the limitations, risks, security and privacy concerns of performing an evaluation and management service by telephone and the availability of in person appointments. I also discussed with the patient that there may be a patient responsible charge related to this service. The patient expressed understanding and agreed to proceed.  Reason for visit: follow up on diabetes, hypertension,  HPI:   69 yr old male with above history here for follow up.   He feels generally well, Denies any recent hypoglyemic events.  Taking his medications INCORRECTLY (SEE BELOW) .  Following a carbohydrate modified diet about 50% of the time and not exercising regularly  numbness, burning and tingling of extremities. Appetite is good. HAS NOT BEEN CHECKING SUGARS. Can't find glucometer.  Has unexpired strips for Freestyule Light  TAKING METFORMIN ONLY ONCE DAILY.  NOT EXERCISING.    DIET AND LIFESTYLE REVIEWED.  Need for exercise acknowledged and need to take metformin twice dialy   Clarified  HTN:  Has nt checked bp ,  Trouble taking telmisartan at bedtime consistently due to falling asleep . Discussed change in regimen to taking it at dinner time with his 2nd metformin dose   Needs RN visit    ROS:  Patient denies  headache, fevers, malaise, unintentional weight loss, skin rash, eye pain, sinus congestion and sinus pain, sore throat, dysphagia,  hemoptysis , cough, dyspnea, wheezing, chest pain, palpitations, orthopnea, edema, abdominal pain, nausea, melena, diarrhea, constipation, flank pain, dysuria, hematuria, urinary  Frequency, nocturia, numbness, tingling, seizures,  Focal weakness, Loss of consciousness,  Tremor, insomnia, depression, anxiety, and suicidal ideation.      EXAM:  VITALS per patient if applicable:  GENERAL: alert, oriented, appears well and in no acute distress  HEENT: atraumatic, conjunttiva clear, no obvious abnormalities on inspection of external nose and ears  NECK: normal movements of the head and neck  LUNGS: on inspection no signs of respiratory distress, breathing rate appears normal, no obvious gross SOB, gasping or wheezing  CV: no obvious cyanosis  MS: moves all visible extremities without noticeable abnormality  PSYCH/NEURO: pleasant and cooperative, no obvious depression or anxiety, speech and thought processing grossly intact  ASSESSMENT AND PLAN:  Discussed the following assessment and plan:  PSA elevation - Plan: Ambulatory referral to Urology  Diabetic nephropathy associated with type 2 diabetes mellitus ([place])  DM type 2, not at goal [facility])  Essential hypertension  Pure hypercholesterolemia  Tubular adenoma of colon  Diabetic nephropathy associated with type 2 diabetes mellitus ([place]) Tolerating change from lisinopril to telmisartan  Lab Results  Component Value Date   MICROALBUR 2.0 (H) 01/16/2019     DM type 2, not at goal [facility]) Due to medication lapse,  Not taking metformin bid,  Continue januvia,  Increase metformin to bid, and start exercising.  Glucometer send to [employer] [facility]  Lab Results  Component Value Date   HGBA1C 7.6 (H) 07/03/2019     Essential hypertension Needs rn visit for bp check.  continue telmisartan and hct   Lab  Results  Component Value Date   CREATININE 0.96 07/03/2019   Lab Results  Component Value Date   NA 137 07/03/2019   K 4.2 07/03/2019   CL 98 07/03/2019   CO2 29 07/03/2019     Hyperlipidemia LDL has risen due to lack of exercise,  He has no CAD history ,  Continue atorvastatin 80 mg, advised to    Start exercising   PSA elevation Recommend urology evaluation given rise in PSA for 2.0 to 2.5   Lab Results  Component Value Date   PSA 2.48 07/03/2019   PSA 2.00 05/14/2014     Tubular adenoma of colon He is overdue for 5 yr follow up and has an appt scheduled iwth Dr Bary Castilla.     I discussed the assessment and treatment plan with the patient. The patient was provided an opportunity to ask questions and all were answered. The patient agreed with the plan and demonstrated an understanding of the instructions.   The patient was advised to call back or seek an in-person evaluation if the symptoms worsen or if the condition fails to improve as anticipated.    [name], MD

## 2019-07-06 NOTE — Assessment & Plan Note (Signed)
LDL has risen due to lack of exercise,  He has no CAD history ,  Continue atorvastatin 80 mg, advised to    Start exercising

## 2019-07-06 NOTE — Assessment & Plan Note (Signed)
He is overdue for 5 yr follow up and has an appt scheduled iwth Dr Bary Castilla.

## 2019-07-10 ENCOUNTER — Ambulatory Visit (INDEPENDENT_AMBULATORY_CARE_PROVIDER_SITE_OTHER): Payer: Medicare Other

## 2019-07-10 ENCOUNTER — Other Ambulatory Visit: Payer: Self-pay

## 2019-07-10 VITALS — BP 122/78 | Temp 97.1°F

## 2019-07-10 DIAGNOSIS — Z23 Encounter for immunization: Secondary | ICD-10-CM | POA: Diagnosis not present

## 2019-07-10 NOTE — Progress Notes (Addendum)
Patient came in today for pneumonia 23 vaccine in left deltoid, which patient tolerated well. Patient also had BP checked. He had taken his telmisartan 40 mg as well as HCTZ 25 mg approximately an hour prior to check. Reading was 122/78 taken in left arm.   Patient also was to know if he could get start the shingrix vaccine series? He had Zoster back in 2017.    BP is excellent, continue current meds and add VS to chart   Yes he can and should get the Shingrx vaccine   . I have replied with the above information and agree with  Nurse's assessment    Deborra Medina, MD  [place].   Dr Nicki Reaper

## 2019-07-12 DIAGNOSIS — Z86010 Personal history of colonic polyps: Secondary | ICD-10-CM | POA: Diagnosis not present

## 2019-07-12 DIAGNOSIS — Z1211 Encounter for screening for malignant neoplasm of colon: Secondary | ICD-10-CM | POA: Diagnosis not present

## 2019-07-13 ENCOUNTER — Other Ambulatory Visit: Payer: Self-pay | Admitting: General Surgery

## 2019-07-16 ENCOUNTER — Other Ambulatory Visit: Payer: Medicare Other

## 2019-07-16 ENCOUNTER — Other Ambulatory Visit
Admission: RE | Admit: 2019-07-16 | Discharge: 2019-07-16 | Disposition: A | Discharge status: Home / Self Care | Payer: Medicare Other | Source: Ambulatory Visit | Attending: General Surgery | Admitting: General Surgery

## 2019-07-16 DIAGNOSIS — Z20822 Contact with and (suspected) exposure to covid-19: Secondary | ICD-10-CM | POA: Diagnosis not present

## 2019-07-16 DIAGNOSIS — Z01812 Encounter for preprocedural laboratory examination: Secondary | ICD-10-CM | POA: Diagnosis not present

## 2019-07-17 ENCOUNTER — Other Ambulatory Visit: Payer: Medicare Other

## 2019-07-17 LAB — SARS CORONAVIRUS 2 (TAT 6-24 HRS): SARS Coronavirus 2: NEGATIVE

## 2019-07-18 ENCOUNTER — Ambulatory Visit
Admission: RE | Admit: 2019-07-18 | Discharge: 2019-07-18 | Disposition: A | Discharge status: Home / Self Care | Payer: Medicare Other | Attending: General Surgery | Admitting: General Surgery

## 2019-07-18 ENCOUNTER — Encounter: Payer: Self-pay | Admitting: General Surgery

## 2019-07-18 ENCOUNTER — Encounter
Admission: RE | Disposition: A | Discharge status: Home / Self Care | Payer: Self-pay | Source: Home / Self Care | Attending: General Surgery

## 2019-07-18 ENCOUNTER — Ambulatory Visit: Payer: Medicare Other | Admitting: Registered Nurse

## 2019-07-18 ENCOUNTER — Other Ambulatory Visit: Payer: Self-pay

## 2019-07-18 DIAGNOSIS — Z9109 Other allergy status, other than to drugs and biological substances: Secondary | ICD-10-CM | POA: Insufficient documentation

## 2019-07-18 DIAGNOSIS — Z7984 Long term (current) use of oral hypoglycemic drugs: Secondary | ICD-10-CM | POA: Diagnosis not present

## 2019-07-18 DIAGNOSIS — Z6830 Body mass index (BMI) 30.0-30.9, adult: Secondary | ICD-10-CM | POA: Diagnosis not present

## 2019-07-18 DIAGNOSIS — E669 Obesity, unspecified: Secondary | ICD-10-CM | POA: Diagnosis not present

## 2019-07-18 DIAGNOSIS — Z87891 Personal history of nicotine dependence: Secondary | ICD-10-CM | POA: Diagnosis not present

## 2019-07-18 DIAGNOSIS — E119 Type 2 diabetes mellitus without complications: Secondary | ICD-10-CM | POA: Insufficient documentation

## 2019-07-18 DIAGNOSIS — E1121 Type 2 diabetes mellitus with diabetic nephropathy: Secondary | ICD-10-CM | POA: Diagnosis not present

## 2019-07-18 DIAGNOSIS — Z7982 Long term (current) use of aspirin: Secondary | ICD-10-CM | POA: Insufficient documentation

## 2019-07-18 DIAGNOSIS — Z86010 Personal history of colonic polyps: Secondary | ICD-10-CM | POA: Insufficient documentation

## 2019-07-18 DIAGNOSIS — Z1211 Encounter for screening for malignant neoplasm of colon: Secondary | ICD-10-CM | POA: Insufficient documentation

## 2019-07-18 DIAGNOSIS — I10 Essential (primary) hypertension: Secondary | ICD-10-CM | POA: Diagnosis not present

## 2019-07-18 DIAGNOSIS — K5790 Diverticulosis of intestine, part unspecified, without perforation or abscess without bleeding: Secondary | ICD-10-CM | POA: Diagnosis not present

## 2019-07-18 DIAGNOSIS — K5730 Diverticulosis of large intestine without perforation or abscess without bleeding: Secondary | ICD-10-CM | POA: Diagnosis not present

## 2019-07-18 DIAGNOSIS — E785 Hyperlipidemia, unspecified: Secondary | ICD-10-CM | POA: Diagnosis not present

## 2019-07-18 HISTORY — PX: COLONOSCOPY WITH PROPOFOL: SHX5780

## 2019-07-18 LAB — GLUCOSE, CAPILLARY: Glucose-Capillary: 143 mg/dL — ABNORMAL HIGH (ref 70–99)

## 2019-07-18 SURGERY — COLONOSCOPY WITH PROPOFOL
Anesthesia: General

## 2019-07-18 MED ORDER — PROPOFOL 10 MG/ML IV BOLUS
INTRAVENOUS | Status: DC | PRN
  Administered 2019-07-18: 60 mg via INTRAVENOUS
  Administered 2019-07-18: 10 mg via INTRAVENOUS
  Administered 2019-07-18: 20 mg via INTRAVENOUS

## 2019-07-18 MED ORDER — PROPOFOL 500 MG/50ML IV EMUL
INTRAVENOUS | Status: DC | PRN
  Administered 2019-07-18: 75 ug/kg/min via INTRAVENOUS

## 2019-07-18 MED ORDER — PROPOFOL 10 MG/ML IV BOLUS
INTRAVENOUS | Status: AC
  Filled 2019-07-18: qty 20

## 2019-07-18 MED ORDER — LIDOCAINE HCL (CARDIAC) PF 100 MG/5ML IV SOSY
PREFILLED_SYRINGE | INTRAVENOUS | Status: DC | PRN
  Administered 2019-07-18: 60 mg via INTRAVENOUS

## 2019-07-18 MED ORDER — SODIUM CHLORIDE 0.9 % IV SOLN: INTRAVENOUS | Status: DC

## 2019-07-18 NOTE — H&P (Signed)
Robert Robertson [id number] [birth year]     HPI:  69 year old male who had a 10 mm tubular adenoma in the ascending colon in 2010.  Negative exam in 2015. For follow up colonoscopy. He reported solid stool at 4 AM to the nurse. Two enemas given. Will see if the exam can be completed.   Medications Prior to Admission  Medication Sig Dispense Refill Last Dose  . aspirin 81 MG tablet Take 81 mg by mouth daily.   07/17/2019 at 1000  . atorvastatin (LIPITOR) 80 MG tablet TAKE 1 TABLET DAILY 90 tablet 3 07/17/2019 at 1000  . blood glucose meter kit and supplies USE TO CHECK BLOOD SUGARS UP TO TWO TIMES DAILY 1 each 0 07/17/2019 at Unknown time  . Blood Glucose Monitoring Suppl (FREESTYLE FREEDOM LITE) w/Device KIT 1 application by Does not apply route 2 (two) times daily. 1 each 0 07/17/2019 at Unknown time  . glucose blood (FREESTYLE LITE) test strip USE TO CHECK SUGARS TWICE A DAY 200 each 1 07/17/2019 at Unknown time  . hydrochlorothiazide (HYDRODIURIL) 25 MG tablet Take 1 tablet (25 mg total) by mouth daily. 90 tablet 3 07/17/2019 at 1000  . JANUVIA 100 MG tablet TAKE 1 TABLET DAILY 90 tablet 3 07/17/2019 at 1000  . Krill Oil 300 MG CAPS Take 1 capsule by mouth daily.   Past Week at Unknown time  . Lancets (FREESTYLE) lancets CHECK SUGAR TWICE A DAY 200 each 0 07/17/2019 at Unknown time  . metFORMIN (GLUCOPHAGE) 500 MG tablet Take 1 tablet (500 mg total) by mouth 2 (two) times daily with a meal. 180 tablet 3 07/17/2019 at 1000  . Multiple Vitamins-Minerals (CENTRUM SILVER ADULT 50+ PO) Take 1 tablet by mouth daily.   07/17/2019 at 1000  . telmisartan (MICARDIS) 40 MG tablet TAKE 1 TABLET AT BEDTIME 90 tablet 3 07/17/2019 at 1800   Allergies  Allergen Reactions  . Pollen Extract Other (See Comments)    Hay-fever. Reaction: Sneezing   Past Medical History:  Diagnosis Date  . Allergy   . Colon polyp   . Diabetes mellitus   . Hyperlipidemia   . Hypertension    Past Surgical History:  Procedure  Laterality Date  . COLONOSCOPY  2010   10 mm adenomatous polyp in the descending colon without atypia, [name], MD   Social History   Socioeconomic History  . Marital status: Married    Spouse name: Not on file  . Number of children: Not on file  . Years of education: Not on file  . Highest education level: Not on file  Occupational History  . Not on file  Tobacco Use  . Smoking status: Former Smoker    Packs/day: 1.00    Years: 30.00    Pack years: 30.00    Types: Cigarettes  . Smokeless tobacco: Never Used  Substance and Sexual Activity  . Alcohol use: Yes    Alcohol/week: 14.0 standard drinks    Types: 14 Glasses of wine per week    Comment: daily  . Drug use: No  . Sexual activity: Not on file  Other Topics Concern  . Not on file  Social History Narrative  . Not on file   Social Determinants of Health   Financial Resource Strain:   . Difficulty of Paying Living Expenses: Not on file  Food Insecurity:   . Worried About Charity fundraiser in the Last Year: Not on file  . Ran Out of Food  in the Last Year: Not on file  Transportation Needs:   . Lack of Transportation (Medical): Not on file  . Lack of Transportation (Non-Medical): Not on file  Physical Activity:   . Days of Exercise per Week: Not on file  . Minutes of Exercise per Session: Not on file  Stress:   . Feeling of Stress : Not on file  Social Connections:   . Frequency of Communication with Friends and Family: Not on file  . Frequency of Social Gatherings with Friends and Family: Not on file  . Attends Religious Services: Not on file  . Active Member of Clubs or Organizations: Not on file  . Attends Archivist Meetings: Not on file  . Marital Status: Not on file  Intimate Partner Violence:   . Fear of Current or Ex-Partner: Not on file  . Emotionally Abused: Not on file  . Physically Abused: Not on file  . Sexually Abused: Not on file   Social History   Social History Narrative   . Not on file     ROS: Negative.     PE: HEENT: Negative. Lungs: Clear. Cardio: RR.  Assessment/Plan:  Proceed with planned endoscopy.   Forest Gleason [facility] 07/18/2019

## 2019-07-18 NOTE — Op Note (Signed)
[facility] Gastroenterology Patient Name: Robert Robertson Procedure Date: 07/18/2019 8:04 AM MRN: [id number] Account #: [id number] Date of Birth: March 21, 1951 Admit Type: Outpatient Age: 69 Room: [facility] ENDO ROOM 1 Gender: Male Note Status: Finalized Procedure:             Colonoscopy Indications:           High risk colon cancer surveillance: Personal history                         of colonic polyps Providers:             Robert Bellow, MD Medicines:             Monitored Anesthesia Care Complications:         No immediate complications. Procedure:             Pre-Anesthesia Assessment:                        - Prior to the procedure, a History and Physical was                         performed, and patient medications, allergies and                         sensitivities were reviewed. The patient's tolerance                         of previous anesthesia was reviewed.                        - The risks and benefits of the procedure and the                         sedation options and risks were discussed with the                         patient. All questions were answered and informed                         consent was obtained.                        After obtaining informed consent, the colonoscope was                         passed under direct vision. Throughout the procedure,                         the patient's blood pressure, pulse, and oxygen                         saturations were monitored continuously. The                         Colonoscope was introduced through the anus and                         advanced to the the cecum, identified by appendiceal  orifice and ileocecal valve. The colonoscopy was                         performed without difficulty. The patient tolerated                         the procedure well. The quality of the bowel                         preparation was adequate to identify polyps. Findings:     Multiple small and large-mouthed diverticula were found in the       recto-sigmoid colon, sigmoid colon, descending colon and ascending colon.      The retroflexed view of the distal rectum and anal verge was normal and       showed no anal or rectal abnormalities. Impression:            - Diverticulosis in the recto-sigmoid colon, in the                         sigmoid colon, in the descending colon and in the                         ascending colon.                        - The distal rectum and anal verge are normal on                         retroflexion view.                        - No specimens collected. Recommendation:        - Repeat colonoscopy in 5 years for surveillance. Procedure Code(s):     --- Professional ---                        [phone], Colonoscopy, flexible; diagnostic, including                         collection of specimen(s) by brushing or washing, when                         performed (separate procedure) Diagnosis Code(s):     --- Professional ---                        Z86.010, Personal history of colonic polyps                        K57.30, Diverticulosis of large intestine without                         perforation or abscess without bleeding CPT copyright 2019 American Medical Association. All rights reserved. The codes documented in this report are preliminary and upon coder review may  be revised to meet current compliance requirements. Robert Bellow, MD 07/18/2019 9:04:46 AM This report has been signed electronically. Number of Addenda: 0 Note Initiated On: 07/18/2019 8:04 AM Scope Withdrawal Time: 0 hours 20 minutes 20 seconds  Total Procedure Duration: 0 hours 28  minutes 12 seconds       [facility]

## 2019-07-18 NOTE — Anesthesia Preprocedure Evaluation (Signed)
Anesthesia Evaluation  Patient identified by MRN, date of birth, ID band Patient awake    Reviewed: Allergy & Precautions, NPO status , Patient's Chart, lab work & pertinent test results  History of Anesthesia Complications Negative for: history of anesthetic complications  Airway Mallampati: II       Dental   Pulmonary neg sleep apnea, neg COPD, Not current smoker, former smoker,           Cardiovascular hypertension, Pt. on medications (-) Past MI and (-) Orthopnea (-) dysrhythmias (-) Valvular Problems/Murmurs     Neuro/Psych neg Seizures    GI/Hepatic Neg liver ROS, neg GERD  ,  Endo/Other  diabetes, Type 2, Oral Hypoglycemic Agents  Renal/GU Renal InsufficiencyRenal disease     Musculoskeletal   Abdominal   Peds  Hematology   Anesthesia Other Findings   Reproductive/Obstetrics                             Anesthesia Physical Anesthesia Plan  ASA: III  Anesthesia Plan: General   Post-op Pain Management:    Induction: Intravenous  PONV Risk Score and Plan: 1 and Propofol infusion and TIVA  Airway Management Planned: Nasal Cannula  Additional Equipment:   Intra-op Plan:   Post-operative Plan:   Informed Consent: I have reviewed the patients History and Physical, chart, labs and discussed the procedure including the risks, benefits and alternatives for the proposed anesthesia with the patient or authorized representative who has indicated his/her understanding and acceptance.       Plan Discussed with:   Anesthesia Plan Comments:         Anesthesia Quick Evaluation

## 2019-07-18 NOTE — Anesthesia Postprocedure Evaluation (Signed)
Anesthesia Post Note  Patient: Robert Robertson.  Procedure(s) Performed: COLONOSCOPY WITH PROPOFOL (N/A )  Patient location during evaluation: Endoscopy Anesthesia Type: General Level of consciousness: awake and alert Pain management: pain level controlled Vital Signs Assessment: post-procedure vital signs reviewed and stable Respiratory status: spontaneous breathing and respiratory function stable Cardiovascular status: stable Anesthetic complications: no     Last Vitals:  Vitals:   07/18/19 0907 07/18/19 0917  BP: 118/79 122/89  Pulse: 84 71  Resp: (!) 26 19  Temp: (!) 36.1 C   SpO2: 99% 100%    Last Pain:  Vitals:   07/18/19 0917  TempSrc:   PainSc: 0-No pain                 Manny Vitolo K

## 2019-07-18 NOTE — Transfer of Care (Signed)
Immediate Anesthesia Transfer of Care Note  Patient: Robert Robertson.  Procedure(s) Performed: COLONOSCOPY WITH PROPOFOL (N/A )  Patient Location: PACU and Endoscopy Unit  Anesthesia Type:General  Level of Consciousness: awake, drowsy and patient cooperative  Airway & Oxygen Therapy: Patient Spontanous Breathing  Post-op Assessment: Report given to RN and Post -op Vital signs reviewed and stable  Post vital signs: Reviewed and stable  Last Vitals:  Vitals Value Taken Time  BP 118/79 07/18/19 0907  Temp    Pulse 84 07/18/19 0907  Resp 25 07/18/19 0907  SpO2 99 % 07/18/19 0907  Vitals shown include unvalidated device data.  Last Pain:  Vitals:   07/18/19 0740  TempSrc: Temporal  PainSc: 0-No pain         Complications: No apparent anesthesia complications

## 2019-07-19 ENCOUNTER — Encounter: Payer: Self-pay | Admitting: *Deleted

## 2019-07-25 ENCOUNTER — Telehealth: Payer: Self-pay | Admitting: Internal Medicine

## 2019-07-25 DIAGNOSIS — E119 Type 2 diabetes mellitus without complications: Secondary | ICD-10-CM

## 2019-07-25 DIAGNOSIS — E7800 Pure hypercholesterolemia, unspecified: Secondary | ICD-10-CM

## 2019-07-25 NOTE — Telephone Encounter (Signed)
Pt would like a call back about a glucose meter and would also like labs ordered before pt's scheduled 68m appt in April. Thanks

## 2019-07-30 MED ORDER — BLOOD GLUCOSE METER KIT: PACK | 0 refills | Status: AC

## 2019-07-30 NOTE — Telephone Encounter (Signed)
Spoke with pt scheduled him a lab appt before his appt with Dr. Derrel Nip in April. Also resent rx to [facility] on [address] for is glucose meter. Pt gave a verbal understanding.

## 2019-07-30 NOTE — Addendum Note (Signed)
Addended by: Adair Laundry on: 07/30/2019 01:32 PM   Modules accepted: Orders

## 2019-08-01 DIAGNOSIS — Z20822 Contact with and (suspected) exposure to covid-19: Secondary | ICD-10-CM | POA: Diagnosis not present

## 2019-08-06 ENCOUNTER — Encounter: Payer: Self-pay | Admitting: Urology

## 2019-08-06 ENCOUNTER — Ambulatory Visit (INDEPENDENT_AMBULATORY_CARE_PROVIDER_SITE_OTHER): Payer: Medicare Other | Admitting: Urology

## 2019-08-06 ENCOUNTER — Other Ambulatory Visit: Payer: Self-pay

## 2019-08-06 VITALS — BP 164/104 | HR 101 | Ht 68.0 in | Wt 200.0 lb

## 2019-08-06 DIAGNOSIS — N400 Enlarged prostate without lower urinary tract symptoms: Secondary | ICD-10-CM

## 2019-08-06 NOTE — Progress Notes (Signed)
08/06/2019 10:44 AM   Robert Robertson. 1951-06-10 [id number]  Referring provider: [name], MD [place],  [place] [address]  Chief Complaint  Patient presents with  . Elevated PSA    HPI: Robert Robertson is a 69 y.o. male seen at the request of Dr. Derrel Nip for evaluation of an elevated PSA.  A PSA drawn on 07/03/2019 was 2.48.  The only other prior PSA in epic was in 2015 and was 2.0.  Referred for urologic opinion due to change in PSA.  He denies bothersome lower urinary tract symptoms or previous history of urologic problems.  Denies dysuria, gross hematuria or flank, abdominal, pelvic pain.   PMH: Past Medical History:  Diagnosis Date  . Allergy   . Colon polyp   . Diabetes mellitus   . Hyperlipidemia   . Hypertension     Surgical History: Past Surgical History:  Procedure Laterality Date  . COLONOSCOPY  2010   10 mm adenomatous polyp in the descending colon without atypia, [name], MD  . COLONOSCOPY WITH PROPOFOL N/A 07/18/2019   Procedure: COLONOSCOPY WITH PROPOFOL;  Surgeon: Robert Bellow, MD;  Location: [facility] ENDOSCOPY;  Service: Endoscopy;  Laterality: N/A;    Home Medications:  Allergies as of 08/06/2019      Reactions   Pollen Extract Other (See Comments)   Hay-fever. Reaction: Sneezing      Medication List       Accurate as of August 06, 2019 10:44 AM. If you have any questions, ask your nurse or doctor.        aspirin 81 MG tablet Take 81 mg by mouth daily.   atorvastatin 80 MG tablet Commonly known as: LIPITOR TAKE 1 TABLET DAILY   blood glucose meter kit and supplies USE TO CHECK BLOOD SUGARS UP TO TWO TIMES DAILY   CENTRUM SILVER ADULT 50+ PO Take 1 tablet by mouth daily.   FreeStyle Freedom Lite w/Device Kit 1 application by Does not apply route 2 (two) times daily.   freestyle lancets CHECK SUGAR TWICE A DAY   FREESTYLE LITE test strip Generic drug: glucose blood USE TO CHECK SUGARS TWICE  A DAY   hydrochlorothiazide 25 MG tablet Commonly known as: HYDRODIURIL Take 1 tablet (25 mg total) by mouth daily.   Januvia 100 MG tablet Generic drug: sitaGLIPtin TAKE 1 TABLET DAILY   Krill Oil 300 MG Caps Take 1 capsule by mouth daily.   metFORMIN 500 MG tablet Commonly known as: GLUCOPHAGE Take 1 tablet (500 mg total) by mouth 2 (two) times daily with a meal.   telmisartan 40 MG tablet Commonly known as: MICARDIS TAKE 1 TABLET AT BEDTIME       Allergies:  Allergies  Allergen Reactions  . Pollen Extract Other (See Comments)    Hay-fever. Reaction: Sneezing    Family History: Family History  Problem Relation Age of Onset  . Diabetes Mother   . Stroke Mother   . Colon cancer Mother   . Diabetes Brother   . Diabetes Maternal Grandfather   . Diabetes Maternal Grandmother   . Cancer Sister        brain tumor    Social History:  reports that he has quit smoking. His smoking use included cigarettes. He has a 30.00 pack-year smoking history. He has never used smokeless tobacco. He reports current alcohol use of about 14.0 standard drinks of alcohol per week. He reports that he does not use drugs.  ROS:  UROLOGY Frequent Urination?: No Hard to postpone urination?: No Burning/pain with urination?: No Get up at night to urinate?: No Leakage of urine?: No Urine stream starts and stops?: No Trouble starting stream?: No Do you have to strain to urinate?: No Blood in urine?: No Urinary tract infection?: No Sexually transmitted disease?: No Injury to kidneys or bladder?: No Painful intercourse?: No Weak stream?: No Erection problems?: No Penile pain?: No  Gastrointestinal Nausea?: No Vomiting?: No Indigestion/heartburn?: No Diarrhea?: No Constipation?: No  Constitutional Fever: No Night sweats?: No Weight loss?: No Fatigue?: No  Skin Skin rash/lesions?: No Itching?: No  Eyes Blurred vision?: No Double vision?: No  Ears/Nose/Throat Sore  throat?: No Sinus problems?: No  Hematologic/Lymphatic Swollen glands?: No Easy bruising?: No  Cardiovascular Leg swelling?: No Chest pain?: No  Respiratory Cough?: No Shortness of breath?: No  Endocrine Excessive thirst?: No  Musculoskeletal Back pain?: No Joint pain?: No  Neurological Headaches?: No Dizziness?: No  Psychologic Depression?: No Anxiety?: No  Physical Exam: BP (!) 164/104   Pulse (!) 101   Ht 5' 8" (1.727 m)   Wt 200 lb (90.7 kg)   BMI 30.41 kg/m   Constitutional:  Alert and oriented, No acute distress. HEENT: [place] AT, moist mucus membranes.  Trachea midline, no masses. Cardiovascular: No clubbing, cyanosis, or edema. Respiratory: Normal respiratory effort, no increased work of breathing. GU: Prostate 30 g, smooth without nodules Neurologic: Grossly intact, no focal deficits, moving all 4 extremities. Psychiatric: Normal mood and affect.   Assessment & Plan:   68 y.o. male with a benign DRE and normal PSA.  We discussed that change in PSA from 2-2.48 is not significant and that an abnormal PSA velocity is defined as >0.75/year.  We also discussed current guidelines for prostate cancer screening and that screening is now recommended for men age 55-69.  I offered him a follow-up in 1 year for recheck versus prn follow-up and he would like to return in 1 year.   Scott C Stoioff, MD  [facility] [address], [address] [place], [place] [address] [phone]  

## 2019-08-10 ENCOUNTER — Telehealth: Payer: Self-pay | Admitting: Internal Medicine

## 2019-08-10 MED ORDER — FREESTYLE FREEDOM LITE W/DEVICE KIT: 1.0000 "application " | PACK | Freq: Two times a day (BID) | 0 refills | Status: DC

## 2019-08-10 NOTE — Telephone Encounter (Signed)
rx has been reprinted with diagnosis code. Placed in quick sign folder.

## 2019-08-10 NOTE — Telephone Encounter (Signed)
Patient called about getting a new meter. Prescription was sent in but [place] on [address]. Needs a diagnostic code. If ordering a Free Style Freedom Lite, he has plenty of test stripes for that model.

## 2019-08-20 ENCOUNTER — Telehealth: Payer: Self-pay | Admitting: Internal Medicine

## 2019-08-20 NOTE — Telephone Encounter (Signed)
This rx has been faxed with dx code to [place] several times. I have circled the dx code on the rx and faxed it back to [place] again today. Pt is aware.

## 2019-08-20 NOTE — Telephone Encounter (Signed)
Pt states that the pharmacy needs diagnosis code on the rx for Blood Glucose Monitoring Suppl (FREESTYLE FREEDOM LITE) w/Device KIT at [facility] on [address]. Pt has been trying to get this for over a month.

## 2019-08-21 ENCOUNTER — Other Ambulatory Visit: Payer: Self-pay

## 2019-08-21 MED ORDER — FREESTYLE FREEDOM LITE W/DEVICE KIT: 1.0000 "application " | PACK | Freq: Two times a day (BID) | 0 refills | Status: AC

## 2019-09-19 ENCOUNTER — Telehealth: Payer: Self-pay | Admitting: Internal Medicine

## 2019-09-19 NOTE — Telephone Encounter (Signed)
Wants second shingles shot. Please advise

## 2019-09-20 NOTE — Telephone Encounter (Signed)
Spoke with pt to let him know that since he has [employer] he will have to get the vaccine done at a local pharmacy in order for it to be covered. Pt gave a verbal understanding.

## 2019-10-01 ENCOUNTER — Other Ambulatory Visit: Payer: Self-pay | Admitting: Internal Medicine

## 2019-10-08 ENCOUNTER — Other Ambulatory Visit: Payer: Self-pay

## 2019-10-08 ENCOUNTER — Other Ambulatory Visit (INDEPENDENT_AMBULATORY_CARE_PROVIDER_SITE_OTHER): Payer: Medicare Other

## 2019-10-08 DIAGNOSIS — E119 Type 2 diabetes mellitus without complications: Secondary | ICD-10-CM

## 2019-10-08 DIAGNOSIS — E7800 Pure hypercholesterolemia, unspecified: Secondary | ICD-10-CM | POA: Diagnosis not present

## 2019-10-08 LAB — COMPREHENSIVE METABOLIC PANEL
ALT: 39 U/L (ref 0–53)
AST: 26 U/L (ref 0–37)
Albumin: 4.3 g/dL (ref 3.5–5.2)
Alkaline Phosphatase: 60 U/L (ref 39–117)
BUN: 13 mg/dL (ref 6–23)
CO2: 27 mEq/L (ref 19–32)
Calcium: 9.2 mg/dL (ref 8.4–10.5)
Chloride: 101 mEq/L (ref 96–112)
Creatinine, Ser: 0.87 mg/dL (ref 0.40–1.50)
GFR: 105.31 mL/min (ref >60.00)
Glucose, Bld: 156 mg/dL — ABNORMAL HIGH (ref 70–99)
Potassium: 4.1 mEq/L (ref 3.5–5.1)
Sodium: 136 mEq/L (ref 135–145)
Total Bilirubin: 0.8 mg/dL (ref 0.2–1.2)
Total Protein: 6.7 g/dL (ref 6.0–8.3)

## 2019-10-08 LAB — LIPID PANEL
Cholesterol: 128 mg/dL (ref 0–200)
HDL: 46.6 mg/dL (ref >39.00)
LDL Cholesterol: 63 mg/dL (ref 0–99)
NonHDL: 81.6
Total CHOL/HDL Ratio: 3
Triglycerides: 95 mg/dL (ref 0.0–149.0)
VLDL: 19 mg/dL (ref 0.0–40.0)

## 2019-10-08 LAB — HEMOGLOBIN A1C: Hgb A1c MFr Bld: 8 % — ABNORMAL HIGH (ref 4.6–6.5)

## 2019-10-10 ENCOUNTER — Ambulatory Visit: Payer: Medicare Other | Admitting: Internal Medicine

## 2019-10-15 ENCOUNTER — Encounter: Payer: Self-pay | Admitting: Internal Medicine

## 2019-10-15 ENCOUNTER — Ambulatory Visit (INDEPENDENT_AMBULATORY_CARE_PROVIDER_SITE_OTHER): Payer: Medicare Other | Admitting: Internal Medicine

## 2019-10-15 ENCOUNTER — Other Ambulatory Visit: Payer: Self-pay

## 2019-10-15 DIAGNOSIS — K760 Fatty (change of) liver, not elsewhere classified: Secondary | ICD-10-CM | POA: Diagnosis not present

## 2019-10-15 DIAGNOSIS — F10982 Alcohol use, unspecified with alcohol-induced sleep disorder: Secondary | ICD-10-CM | POA: Insufficient documentation

## 2019-10-15 DIAGNOSIS — E119 Type 2 diabetes mellitus without complications: Secondary | ICD-10-CM

## 2019-10-15 DIAGNOSIS — I10 Essential (primary) hypertension: Secondary | ICD-10-CM

## 2019-10-15 DIAGNOSIS — E7800 Pure hypercholesterolemia, unspecified: Secondary | ICD-10-CM | POA: Diagnosis not present

## 2019-10-15 MED ORDER — AMLODIPINE BESYLATE 5 MG PO TABS: 5.0000 mg | ORAL_TABLET | Freq: Every day | ORAL | 0 refills | Status: DC

## 2019-10-15 NOTE — Progress Notes (Signed)
Subjective:  Patient ID: Robert Robertson., male    DOB: 1950-10-16  Age: 69 y.o. 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  HPI [name]. presents for follow up on multiple chronic conditions  This visit occurred during the SARS-CoV-2 public health emergency.  Safety protocols were in place, including screening questions prior to the visit, additional usage of staff PPE, and extensive cleaning of exam room while observing appropriate contact time as indicated for disinfecting solutions.    Patient has received both doses of the [place] 19 vaccine without complications.  Patient continues to mask when outside of the home except when walking in yard or at safe distances from others .  Patient denies any change in mood or development of unhealthy behaviors resuting from the pandemic's restriction of activities and socialization.    1) T 2 DM:   Patient checking BS 1-2 times daily .  fastings frequently  > 150 and PP > 180.  Diet reviewed,  Many starches. 1-2 per meal  Most meals.  2 regular beers at happy hour, another galss of wine around 1 am after waking up on couch.   exercising regularly  .  But plans to be more active in the garden now.  Taking metformin 500 mg bid and januvia 100 mg daily  2) HTN:  measuring readings daily  On hctz  telmisartan .  All readings > 149 systolic  70% > 263   Outpatient Medications Prior to Visit  Medication Sig Dispense Refill  . aspirin 81 MG tablet Take 81 mg by mouth daily.    [name] atorvastatin (LIPITOR) 80 MG tablet TAKE 1 TABLET DAILY 90 tablet 3  . blood glucose meter kit and supplies USE TO CHECK BLOOD SUGARS UP TO TWO TIMES DAILY 1 each 0  . Blood Glucose Monitoring Suppl (FREESTYLE FREEDOM LITE) w/Device KIT 1 application by Does not apply route 2 (two) times daily. 1 kit 0    . glucose blood (FREESTYLE LITE) test strip USE TO CHECK SUGARS TWICE A DAY 200 each 1  . hydrochlorothiazide (HYDRODIURIL) 25 MG tablet TAKE 1 TABLET DAILY 90 tablet 3  . JANUVIA 100 MG tablet TAKE 1 TABLET DAILY 90 tablet 3  . Krill Oil 300 MG CAPS Take 1 capsule by mouth daily.    . Lancets (FREESTYLE) lancets CHECK SUGAR TWICE A DAY 200 each 0  . metFORMIN (GLUCOPHAGE) 500 MG tablet Take 1 tablet (500 mg total) by mouth 2 (two) times daily with a meal. 180 tablet 3  . Multiple Vitamins-Minerals (CENTRUM SILVER ADULT 50+ PO) Take 1 tablet by mouth daily.    [name] telmisartan (MICARDIS) 40 MG tablet TAKE 1 TABLET AT BEDTIME 90 tablet 3   No facility-administered medications prior to visit.    Review of Systems;  Patient denies headache, fevers, malaise, unintentional weight loss, skin rash, eye pain, sinus congestion and sinus pain, sore throat, dysphagia,  hemoptysis , cough, dyspnea, wheezing, chest pain, palpitations, orthopnea, edema, abdominal pain, nausea, melena, diarrhea, constipation, flank pain, dysuria, hematuria, urinary  Frequency, nocturia, numbness, tingling, seizures,  Focal weakness, Loss of consciousness,  Tremor, insomnia, depression, anxiety, and suicidal ideation.      Objective:  BP 134/74 (BP Location: Left Arm, Patient Position: Sitting, Cuff Size: Normal)   Pulse (!) 101   Temp 97.9 F (36.6 C) (  Temporal)   Resp 16   Ht 5' 8"  (1.727 m)   Wt 201 lb 6.4 oz (91.4 kg)   SpO2 97%   BMI 30.62 kg/m   BP Readings from Last 3 Encounters:  10/15/19 134/74  08/06/19 (!) 164/104  07/18/19 122/89    Wt Readings from Last 3 Encounters:  10/15/19 201 lb 6.4 oz (91.4 kg)  08/06/19 200 lb (90.7 kg)  07/18/19 200 lb (90.7 kg)    General appearance: alert, cooperative and appears stated age Ears: normal TM's and external ear canals both ears Throat: lips, mucosa, and tongue normal; teeth and gums normal Neck: no adenopathy, no carotid bruit, supple, symmetrical,  trachea midline and thyroid not enlarged, symmetric, no tenderness/mass/nodules Back: symmetric, no curvature. ROM normal. No CVA tenderness. Lungs: clear to auscultation bilaterally Heart: regular rate and rhythm, S1, S2 normal, no murmur, click, rub or gallop Abdomen: soft, non-tender; bowel sounds normal; no masses,  no organomegaly Pulses: 2+ and symmetric Skin: Skin color, texture, turgor normal. No rashes or lesions Lymph nodes: Cervical, supraclavicular, and axillary nodes normal.  Lab Results  Component Value Date   HGBA1C 8.0 (H) 10/08/2019   HGBA1C 7.6 (H) 07/03/2019   HGBA1C 7.3 (H) 01/16/2019    Lab Results  Component Value Date   CREATININE 0.87 10/08/2019   CREATININE 0.96 07/03/2019   CREATININE 0.92 01/16/2019    Lab Results  Component Value Date   WBC 5.4 06/07/2018   HGB 14.2 06/07/2018   HCT 43.6 06/07/2018   PLT 170.0 06/07/2018   GLUCOSE 156 (H) 10/08/2019   CHOL 128 10/08/2019   TRIG 95.0 10/08/2019   HDL 46.60 10/08/2019   LDLDIRECT 59.0 08/31/2016   LDLCALC 63 10/08/2019   ALT 39 10/08/2019   AST 26 10/08/2019   NA 136 10/08/2019   K 4.1 10/08/2019   CL 101 10/08/2019   CREATININE 0.87 10/08/2019   BUN 13 10/08/2019   CO2 27 10/08/2019   PSA 2.48 07/03/2019   INR 1.1 (H) 10/09/2018   HGBA1C 8.0 (H) 10/08/2019   MICROALBUR 2.0 (H) 01/16/2019    No results found.  Assessment & Plan:   Problem List Items Addressed This Visit      Unprioritized   Essential hypertension    Adding 5 mg amlodipine to telmisartan and hctz regimen.   Lab Results  Component Value Date   NA 136 10/08/2019   K 4.1 10/08/2019   CL 101 10/08/2019   CO2 27 10/08/2019   Lab Results  Component Value Date   CREATININE 0.87 10/08/2019         Relevant Medications   amLODipine (NORVASC) 5 MG tablet   DM type 2, not at goal [facility])    Advised to : Increase metformin to 1000 mg bid and reduce starches in diet while increasing activity level.   Reduce alcohol  consumption. Follow up one month    Lab Results  Component Value Date   HGBA1C 8.0 (H) 10/08/2019   Lab Results  Component Value Date   MICROALBUR 2.0 (H) 01/16/2019   MICROALBUR <0.7 06/07/2018           Hyperlipidemia    LDL at goal.   Continue atorvastatin 80 mg, advised to    Start exercising   Lab Results  Component Value Date   CHOL 128 10/08/2019   HDL 46.60 10/08/2019   LDLCALC 63 10/08/2019   LDLDIRECT 59.0 08/31/2016   TRIG 95.0 10/08/2019   CHOLHDL 3 10/08/2019  Relevant Medications   amLODipine (NORVASC) 5 MG tablet   Hepatic steatosis    Liver enzymes are normal despite use of 3 alcoholic beverages daily.  Advised to reduce to 1-2 daily to protect liver.   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 start on amLODipine. I am also having him maintain his aspirin, Krill Oil, Multiple Vitamins-Minerals (CENTRUM SILVER ADULT 50+ PO), freestyle, metFORMIN, atorvastatin, telmisartan, Januvia, FREESTYLE LITE, blood glucose meter kit and supplies, FreeStyle Freedom Lite, and hydrochlorothiazide.  Meds ordered this encounter  Medications  . amLODipine (NORVASC) 5 MG tablet    Sig: Take 1 tablet (5 mg total) by mouth daily.    Dispense:  90 tablet    Refill:  0    There are no discontinued medications.  Follow-up: Return in about 4 weeks (around 11/12/2019) for follow up diabetes.   [name], MD

## 2019-10-15 NOTE — Assessment & Plan Note (Signed)
Liver enzymes are normal despite use of 3 alcoholic beverages daily.  Advised to reduce to 1-2 daily to protect liver.   Lab Results  Component Value Date   ALT 39 10/08/2019   AST 26 10/08/2019   ALKPHOS 60 10/08/2019   BILITOT 0.8 10/08/2019

## 2019-10-15 NOTE — Assessment & Plan Note (Addendum)
Advised to : Increase metformin to 1000 mg bid and reduce starches in diet while increasing activity level.   Reduce alcohol consumption. Follow up one month    Lab Results  Component Value Date   HGBA1C 8.0 (H) 10/08/2019   Lab Results  Component Value Date   MICROALBUR 2.0 (H) 01/16/2019   MICROALBUR <0.7 06/07/2018

## 2019-10-15 NOTE — Assessment & Plan Note (Signed)
Advised to reduce alcohol to 1-2 beverages per night and try using melatonin at dinnertime

## 2019-10-15 NOTE — Assessment & Plan Note (Signed)
Adding 5 mg amlodipine to telmisartan and hctz regimen.   Lab Results  Component Value Date   NA 136 10/08/2019   K 4.1 10/08/2019   CL 101 10/08/2019   CO2 27 10/08/2019   Lab Results  Component Value Date   CREATININE 0.87 10/08/2019

## 2019-10-15 NOTE — Assessment & Plan Note (Signed)
LDL at goal.   Continue atorvastatin 80 mg, advised to    Start exercising   Lab Results  Component Value Date   CHOL 128 10/08/2019   HDL 46.60 10/08/2019   LDLCALC 63 10/08/2019   LDLDIRECT 59.0 08/31/2016   TRIG 95.0 10/08/2019   CHOLHDL 3 10/08/2019

## 2019-10-15 NOTE — Patient Instructions (Addendum)
Increase metformin dose to 1000 mg twice daily with or before meals  If diarrhea or nausea becomes persistent   Call and we will change to the XR formula  Continue Januvia  Start daily exercising   Send readings in one month    For the blood pressure  We care adding amlodipine 5 mg  At dinner time   continue hctz and telmisartan.   We can combine your bp meds  Into one dual tablet down the road  Goal BP 120/70  To 130/80    Try   Taking 10 mg of melatonin with dinner for the sleep issues   Reduce total alcohol to 2 drinks per night

## 2019-10-29 ENCOUNTER — Telehealth: Payer: Self-pay | Admitting: Internal Medicine

## 2019-10-29 NOTE — Telephone Encounter (Signed)
Pt wanted to know if he needed labs done

## 2019-10-30 NOTE — Telephone Encounter (Signed)
Spoke with pt to let him know that he is not due for any labs until July. Pt gave a verbal understanding.

## 2019-11-12 ENCOUNTER — Encounter: Payer: Self-pay | Admitting: Internal Medicine

## 2019-11-12 ENCOUNTER — Ambulatory Visit (INDEPENDENT_AMBULATORY_CARE_PROVIDER_SITE_OTHER): Payer: Medicare Other

## 2019-11-12 ENCOUNTER — Ambulatory Visit: Payer: Medicare Other

## 2019-11-12 ENCOUNTER — Other Ambulatory Visit: Payer: Self-pay

## 2019-11-12 ENCOUNTER — Telehealth: Payer: Self-pay | Admitting: Internal Medicine

## 2019-11-12 ENCOUNTER — Ambulatory Visit (INDEPENDENT_AMBULATORY_CARE_PROVIDER_SITE_OTHER): Payer: Medicare Other | Admitting: Internal Medicine

## 2019-11-12 VITALS — BP 132/74 | HR 88 | Temp 98.1°F | Resp 14 | Ht 68.0 in | Wt 197.0 lb

## 2019-11-12 DIAGNOSIS — E119 Type 2 diabetes mellitus without complications: Secondary | ICD-10-CM | POA: Diagnosis not present

## 2019-11-12 DIAGNOSIS — M25572 Pain in left ankle and joints of left foot: Secondary | ICD-10-CM

## 2019-11-12 DIAGNOSIS — I10 Essential (primary) hypertension: Secondary | ICD-10-CM | POA: Diagnosis not present

## 2019-11-12 DIAGNOSIS — S8992XA Unspecified injury of left lower leg, initial encounter: Secondary | ICD-10-CM | POA: Diagnosis not present

## 2019-11-12 DIAGNOSIS — E1121 Type 2 diabetes mellitus with diabetic nephropathy: Secondary | ICD-10-CM | POA: Diagnosis not present

## 2019-11-12 DIAGNOSIS — M7989 Other specified soft tissue disorders: Secondary | ICD-10-CM | POA: Diagnosis not present

## 2019-11-12 IMAGING — DX DG TIBIA/FIBULA 2V*L*
4 series · 4 of 4 positions shown · non-contrast
Comparison: None.

CLINICAL DATA: Left leg swelling following a fall.

EXAM:
LEFT TIBIA AND FIBULA - 2 VIEW

[lower leg ap (1 of 2)]
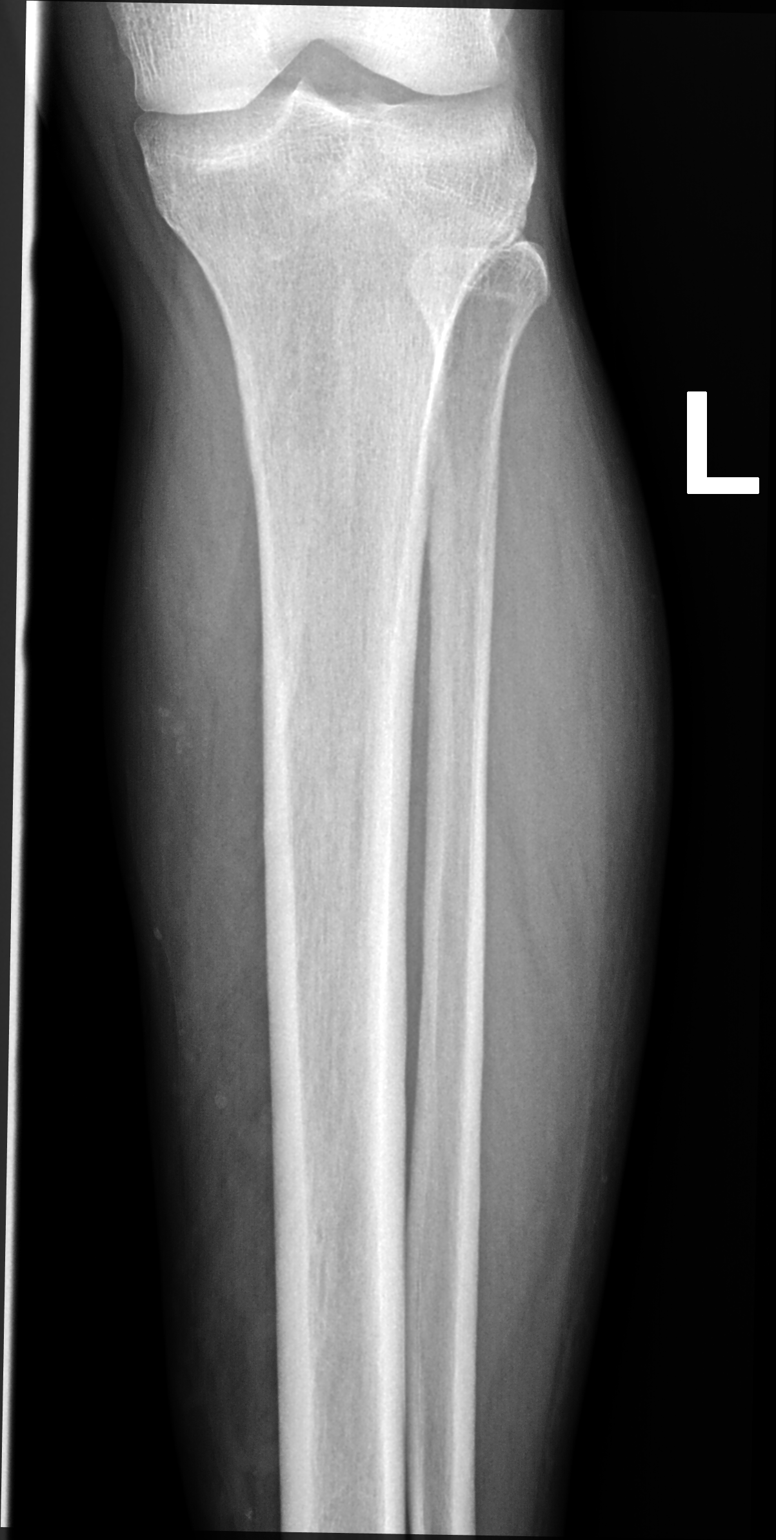

[lower leg ap (2 of 2)]
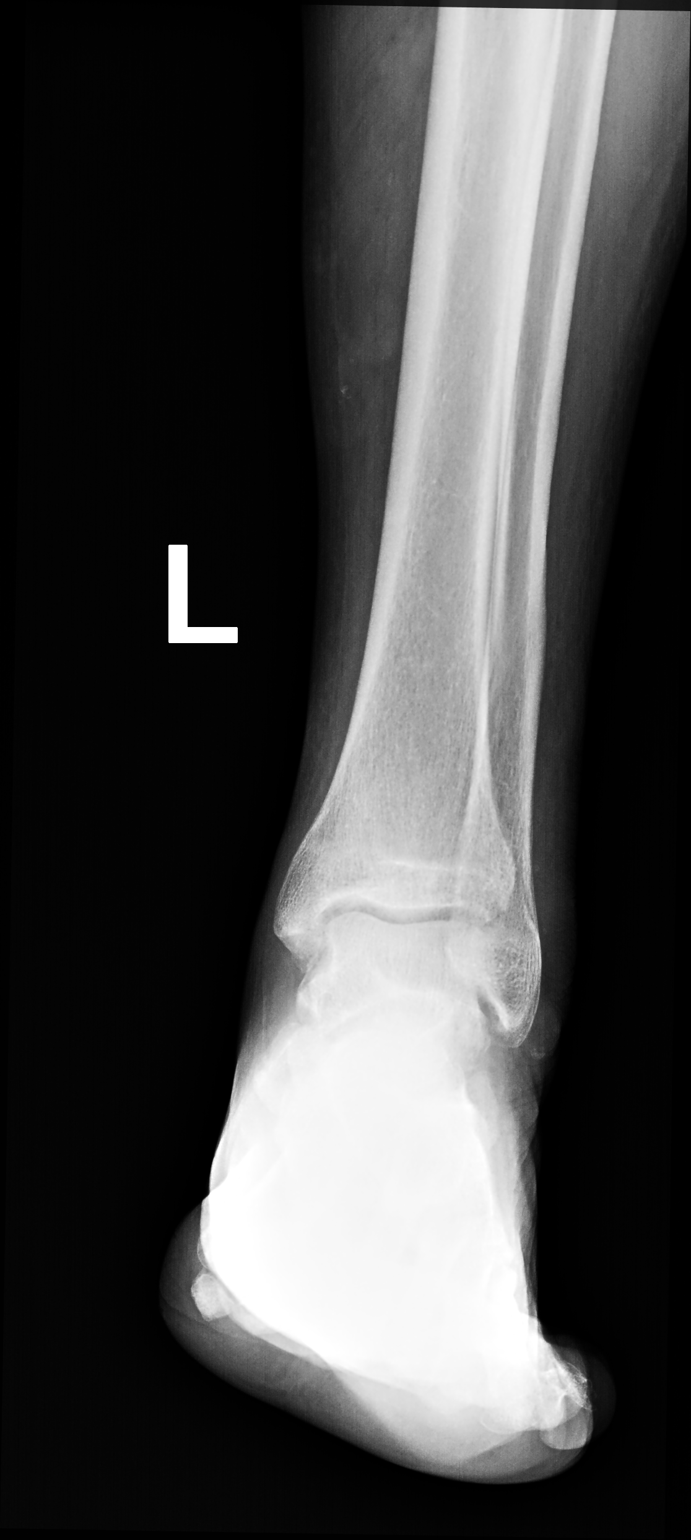

[lower leg lat (1 of 2)]
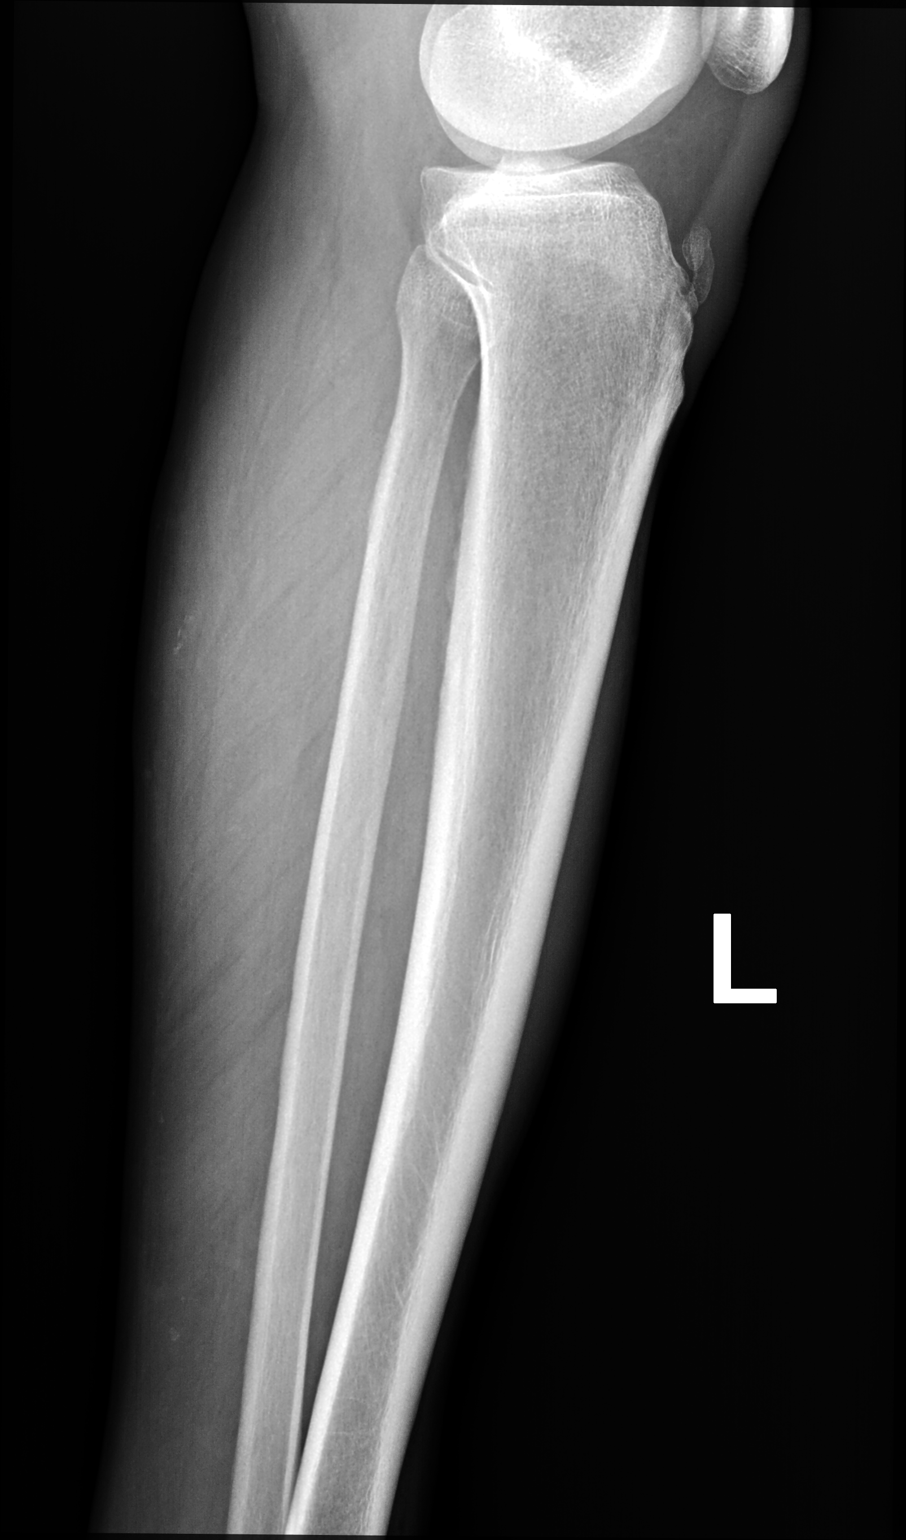

[lower leg lat (2 of 2)]
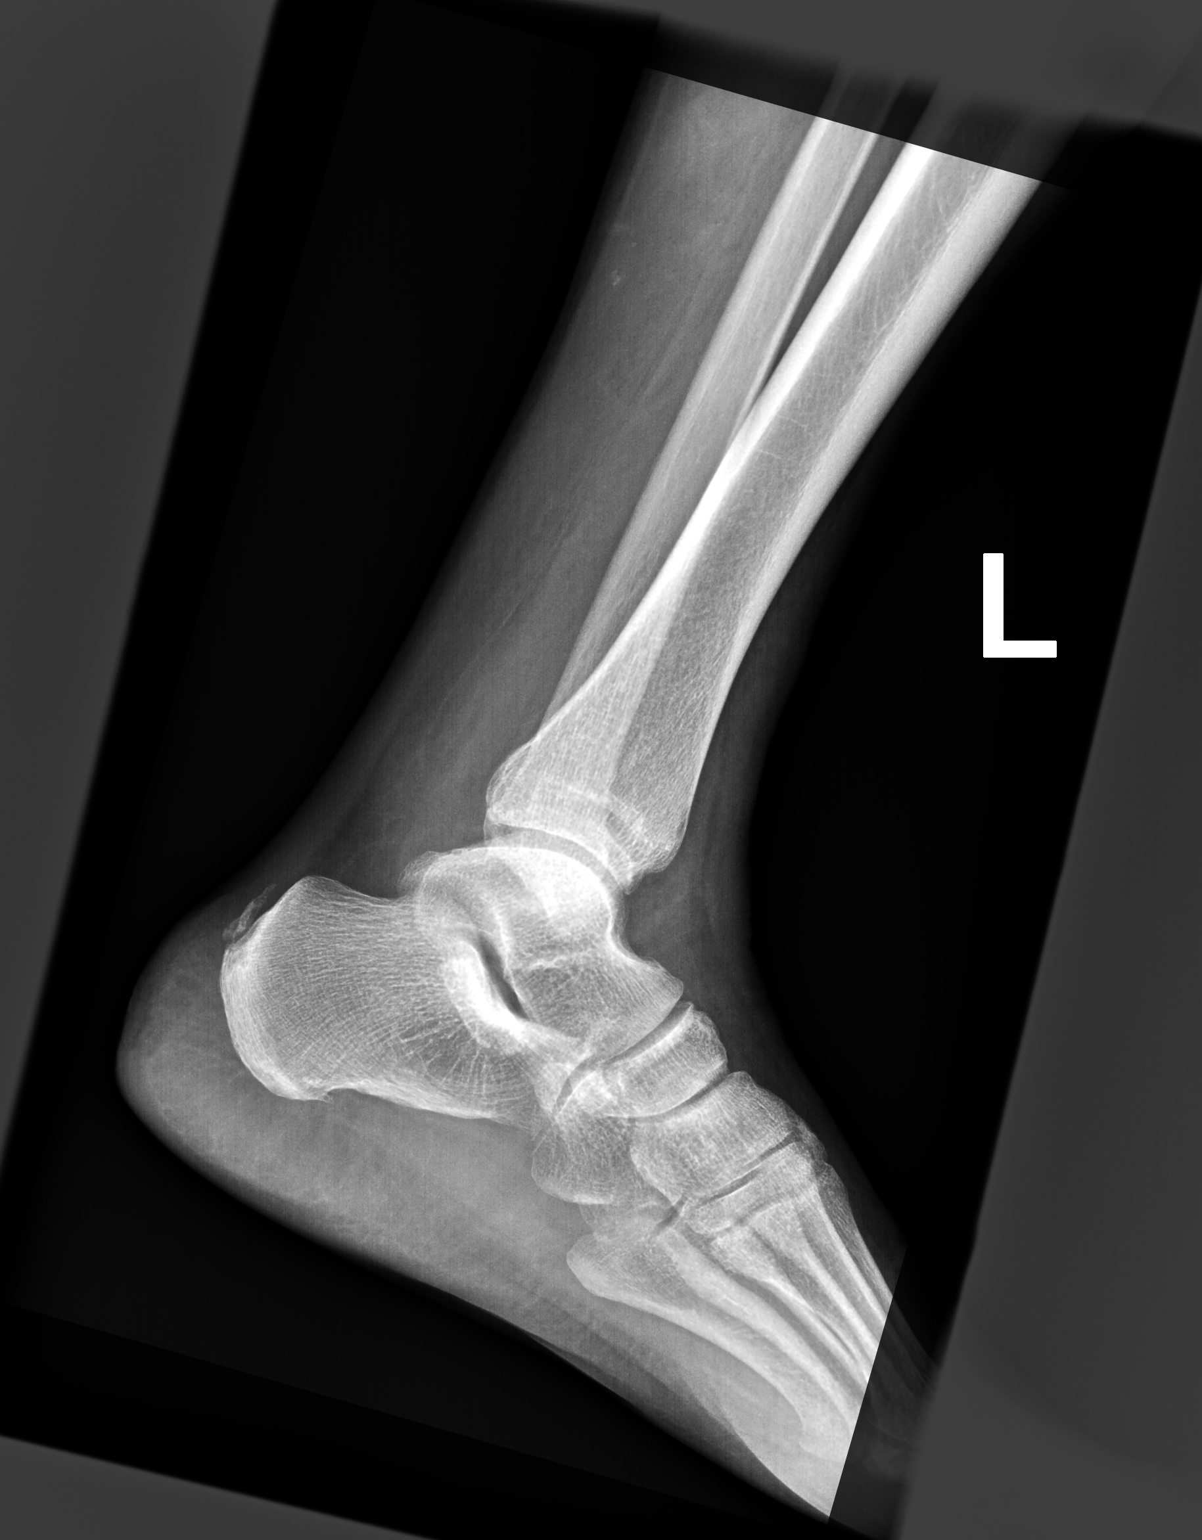

[4 of 4 positions shown; findings below may reference images not displayed]

FINDINGS: No fracture.  No bone lesion.

Knee and ankle joints are normally aligned.

Soft tissues are unremarkable.
IMPRESSION: No no fracture, dislocation or acute finding.

## 2019-11-12 MED ORDER — METFORMIN HCL 1000 MG PO TABS: 1000.0000 mg | ORAL_TABLET | Freq: Two times a day (BID) | ORAL | 0 refills | Status: DC

## 2019-11-12 NOTE — Progress Notes (Signed)
Subjective:  Patient ID: Robert Lat., male    DOB: 1950/07/06  Age: 69 y.o. MRN: [id number]  CC: The primary encounter diagnosis was Left lateral ankle pain. Diagnoses of Diabetic nephropathy associated with type 2 diabetes mellitus ([place]), DM type 2, not at goal [facility]), and Essential hypertension were also pertinent to this visit.  HPI Robert Robertson. presents for follow up on hypertension and diabetes  Patient is taking his medications as prescribed and notes no adverse effects. Amlodipine 5 mg as added at last visit.   Still taking telmisartan 40 mg qhs and hctz 25 mg in am Home BP readings have been done daily and are  generally > 130/80 . Diastolics are always 80 to 90.  Worse after salty meals, which he is not  avoiding .  He is currently gardening for exercise but plans to rejoin the gym now that he is vaccinated 3 times per week for exercise  .  Diabetes:  Metformin 1000 mg bid now tolerated without persistent diarrhea.  BS for the last 2 weeks <140 fasting and < 160 post prandially  3) Left lateral ankle pain following a trip and fall on cement.  Has chronic peripheral Neuropathy affects feeling,  Ankle and distal tib still swollen   Outpatient Medications Prior to Visit  Medication Sig Dispense Refill  . amLODipine (NORVASC) 5 MG tablet Take 1 tablet (5 mg total) by mouth daily. 90 tablet 0  . aspirin 81 MG tablet Take 81 mg by mouth daily.    [name] atorvastatin (LIPITOR) 80 MG tablet TAKE 1 TABLET DAILY 90 tablet 3  . blood glucose meter kit and supplies USE TO CHECK BLOOD SUGARS UP TO TWO TIMES DAILY 1 each 0  . Blood Glucose Monitoring Suppl (FREESTYLE FREEDOM LITE) w/Device KIT 1 application by Does not apply route 2 (two) times daily. 1 kit 0  . glucose blood (FREESTYLE LITE) test strip USE TO CHECK SUGARS TWICE A DAY 200 each 1  . hydrochlorothiazide (HYDRODIURIL) 25 MG tablet TAKE 1 TABLET DAILY 90 tablet 3  . JANUVIA 100 MG tablet TAKE 1 TABLET DAILY 90 tablet 3   . Krill Oil 300 MG CAPS Take 1 capsule by mouth daily.    . Lancets (FREESTYLE) lancets CHECK SUGAR TWICE A DAY 200 each 0  . Multiple Vitamins-Minerals (CENTRUM SILVER ADULT 50+ PO) Take 1 tablet by mouth daily.    [name] telmisartan (MICARDIS) 40 MG tablet TAKE 1 TABLET AT BEDTIME 90 tablet 3  . metFORMIN (GLUCOPHAGE) 500 MG tablet Take 1 tablet (500 mg total) by mouth 2 (two) times daily with a meal. (Patient taking differently: Take 1,000 mg by mouth 2 (two) times daily with a meal. ) 180 tablet 3   No facility-administered medications prior to visit.    Review of Systems;  Patient denies headache, fevers, malaise, unintentional weight loss, skin rash, eye pain, sinus congestion and sinus pain, sore throat, dysphagia,  hemoptysis , cough, dyspnea, wheezing, chest pain, palpitations, orthopnea, edema, abdominal pain, nausea, melena, diarrhea, constipation, flank pain, dysuria, hematuria, urinary  Frequency, nocturia, numbness, tingling, seizures,  Focal weakness, Loss of consciousness,  Tremor, insomnia, depression, anxiety, and suicidal ideation.      Objective:  BP 132/74 (BP Location: Left Arm, Patient Position: Sitting, Cuff Size: Normal)   Pulse 88   Temp 98.1 F (36.7 C) (Temporal)   Resp 14   Ht 5' 8"  (1.727 m)   Wt 197 lb (89.4 kg)   SpO2  97%   BMI 29.95 kg/m   BP Readings from Last 3 Encounters:  11/12/19 132/74  10/15/19 134/74  08/06/19 (!) 164/104    Wt Readings from Last 3 Encounters:  11/12/19 197 lb (89.4 kg)  10/15/19 201 lb 6.4 oz (91.4 kg)  08/06/19 200 lb (90.7 kg)    General appearance: alert, cooperative and appears stated age Ears: normal TM's and external ear canals both ears Throat: lips, mucosa, and tongue normal; teeth and gums normal Neck: no adenopathy, no carotid bruit, supple, symmetrical, trachea midline and thyroid not enlarged, symmetric, no tenderness/mass/nodules Back: symmetric, no curvature. ROM normal. No CVA tenderness. Lungs: clear  to auscultation bilaterally Heart: regular rate and rhythm, S1, S2 normal, no murmur, click, rub or gallop Abdomen: soft, non-tender; bowel sounds normal; no masses,  no organomegaly Pulses: 2+ and symmetric Skin: Skin color, texture, turgor normal. No rashes or lesions Lymph nodes: Cervical, supraclavicular, and axillary nodes normal. Ext: left lateral ankle with nonpitting edema. Brawny skin changes , pain on palpation of lateral malleolus  Lab Results  Component Value Date   HGBA1C 8.0 (H) 10/08/2019   HGBA1C 7.6 (H) 07/03/2019   HGBA1C 7.3 (H) 01/16/2019    Lab Results  Component Value Date   CREATININE 0.87 10/08/2019   CREATININE 0.96 07/03/2019   CREATININE 0.92 01/16/2019    Lab Results  Component Value Date   WBC 5.4 06/07/2018   HGB 14.2 06/07/2018   HCT 43.6 06/07/2018   PLT 170.0 06/07/2018   GLUCOSE 156 (H) 10/08/2019   CHOL 128 10/08/2019   TRIG 95.0 10/08/2019   HDL 46.60 10/08/2019   LDLDIRECT 59.0 08/31/2016   LDLCALC 63 10/08/2019   ALT 39 10/08/2019   AST 26 10/08/2019   NA 136 10/08/2019   K 4.1 10/08/2019   CL 101 10/08/2019   CREATININE 0.87 10/08/2019   BUN 13 10/08/2019   CO2 27 10/08/2019   PSA 2.48 07/03/2019   INR 1.1 (H) 10/09/2018   HGBA1C 8.0 (H) 10/08/2019   MICROALBUR 2.0 (H) 01/16/2019    No results found.  Assessment & Plan:   Problem List Items Addressed This Visit      Unprioritized   Diabetic nephropathy associated with type 2 diabetes mellitus ([place])    He is Tolerating change from lisinopril to telmisartan  Lab Results  Component Value Date   MICROALBUR 2.0 (H) 01/16/2019         Relevant Medications   metFORMIN (GLUCOPHAGE) 1000 MG tablet   DM type 2, not at goal [facility])    Now taking 1000 mg metformin and 100 mg januvia BS are improving  No changes today since he is becoming more active.  Lab Results  Component Value Date   HGBA1C 8.0 (H) 10/08/2019          Relevant Medications   metFORMIN  (GLUCOPHAGE) 1000 MG tablet   Essential hypertension    Blood pressures are now at goal.  No changes today   Lab Results  Component Value Date   CREATININE 0.87 10/08/2019   Lab Results  Component Value Date   NA 136 10/08/2019   K 4.1 10/08/2019   CL 101 10/08/2019   CO2 27 10/08/2019         Left lateral ankle pain - Primary    With swelling and bruising .  Following a trip and fall.  Plain films to rule out fractures (lateral malleolar)      Relevant Orders   DG Ankle Complete  Left   DG Tibia/Fibula Left (Completed)      I have changed Robert Raring Jr.'s metFORMIN. I am also having him maintain his aspirin, Krill Oil, Multiple Vitamins-Minerals (CENTRUM SILVER ADULT 50+ PO), freestyle, atorvastatin, telmisartan, Januvia, FREESTYLE LITE, blood glucose meter kit and supplies, FreeStyle Freedom Lite, hydrochlorothiazide, and amLODipine.  Meds ordered this encounter  Medications  . metFORMIN (GLUCOPHAGE) 1000 MG tablet    Sig: Take 1 tablet (1,000 mg total) by mouth 2 (two) times daily with a meal.    Dispense:  180 tablet    Refill:  0    Medications Discontinued During This Encounter  Medication Reason  . metFORMIN (GLUCOPHAGE) 500 MG tablet     Follow-up: Return in about 4 weeks (around 12/10/2019).   Robert Mc, MD

## 2019-11-12 NOTE — Assessment & Plan Note (Signed)
Blood pressures are now at goal.  No changes today   Lab Results  Component Value Date   CREATININE 0.87 10/08/2019   Lab Results  Component Value Date   NA 136 10/08/2019   K 4.1 10/08/2019   CL 101 10/08/2019   CO2 27 10/08/2019

## 2019-11-12 NOTE — Assessment & Plan Note (Signed)
He is Tolerating change from lisinopril to telmisartan ° °Lab Results  °Component Value Date  ° MICROALBUR 2.0 (H) 01/16/2019  ° ° °

## 2019-11-12 NOTE — Telephone Encounter (Signed)
Pt can will not be due for labs until 01/07/2020 or after or insurance will not cover the labs. If he would like he can reschedule his appt for in July and schedule a lab appt a few days before that.

## 2019-11-12 NOTE — Telephone Encounter (Signed)
Pt scheduled a follow up appt 12/17/19. Pt states that he needs labs done prior. Please advise

## 2019-11-12 NOTE — Assessment & Plan Note (Signed)
Now taking 1000 mg metformin and 100 mg januvia BS are improving  No changes today since he is becoming more active.  Lab Results  Component Value Date   HGBA1C 8.0 (H) 10/08/2019

## 2019-11-12 NOTE — Assessment & Plan Note (Signed)
With swelling and bruising .  Following a trip and fall.  Plain films to rule out fractures (lateral malleolar)

## 2019-11-12 NOTE — Patient Instructions (Addendum)
Your blood pressure is improving,  But the diastolic readings are still high   Goal is 120/70 to 130/80 (if tolerated)   Please increase amlodipine to 10 mg at night for one week,  And send me the readings     No changes to diabetes meds.  Because exercise will help lower your sugars.  Continue 1000 mg metfomrin twice daily and Januvia 100 mg daily   try using nuts instead of trail mix  X ray of left ankle today to rule out fracture from your recent fall

## 2019-11-13 DIAGNOSIS — M25572 Pain in left ankle and joints of left foot: Secondary | ICD-10-CM

## 2019-11-21 ENCOUNTER — Other Ambulatory Visit: Payer: Self-pay | Admitting: Internal Medicine

## 2019-11-21 DIAGNOSIS — I10 Essential (primary) hypertension: Secondary | ICD-10-CM

## 2019-11-21 MED ORDER — AMLODIPINE BESYLATE 10 MG PO TABS: 10.0000 mg | ORAL_TABLET | Freq: Every day | ORAL | 1 refills | Status: DC

## 2019-11-21 NOTE — Assessment & Plan Note (Signed)
BP at goal on 10 mg amlodipine dose.

## 2019-12-04 DIAGNOSIS — H40003 Preglaucoma, unspecified, bilateral: Secondary | ICD-10-CM | POA: Diagnosis not present

## 2019-12-04 LAB — HM DIABETES EYE EXAM

## 2019-12-17 ENCOUNTER — Encounter: Payer: Self-pay | Admitting: Internal Medicine

## 2019-12-17 ENCOUNTER — Ambulatory Visit (INDEPENDENT_AMBULATORY_CARE_PROVIDER_SITE_OTHER): Payer: Medicare Other | Admitting: Internal Medicine

## 2019-12-17 ENCOUNTER — Other Ambulatory Visit: Payer: Self-pay

## 2019-12-17 VITALS — BP 124/72 | HR 83 | Temp 98.1°F | Resp 15 | Ht 68.0 in | Wt 194.8 lb

## 2019-12-17 DIAGNOSIS — E119 Type 2 diabetes mellitus without complications: Secondary | ICD-10-CM

## 2019-12-17 DIAGNOSIS — E1121 Type 2 diabetes mellitus with diabetic nephropathy: Secondary | ICD-10-CM | POA: Diagnosis not present

## 2019-12-17 DIAGNOSIS — I10 Essential (primary) hypertension: Secondary | ICD-10-CM

## 2019-12-17 NOTE — Progress Notes (Signed)
Subjective:  Patient ID: Robert Lat., male    DOB: 1950/08/26  Age: 69 y.o. MRN: [id number]  CC: The primary encounter diagnosis was DM type 2, not at goal [facility]). Diagnoses of Diabetic nephropathy associated with type 2 diabetes mellitus ([place]) and Essential hypertension were also pertinent to this visit.  HPI Robert Robertson. presents for follow up on uncontrolled hypertension and diabetes (one month follow up)  This visit occurred during the SARS-CoV-2 public health emergency.  Safety protocols were in place, including screening questions prior to the visit, additional usage of staff PPE, and extensive cleaning of exam room while observing appropriate contact time as indicated for disinfecting solutions.    HTN:  At last visit amlodipine was increased to 10 mg daily.  Patient is taking his medications as prescribed and notes no adverse effects.  Home BP readings have been done about once per week and are  generally < 140/80 .  he is avoiding added salt in his diet and walking regularly about 5 times per week for exercise  .   He feels generally well, is exercising several times per week and checking blood sugars once or twice daily at variable times.  BS have been under 130 fasting and < 160 post prandially.  Denies any recent hypoglyemic events.  Taking his medications as directed. Following a carbohydrate modified diet 6 days per week. Denies numbness, burning and tingling of extremities. Appetite is good.      Outpatient Medications Prior to Visit  Medication Sig Dispense Refill  . amLODipine (NORVASC) 10 MG tablet Take 1 tablet (10 mg total) by mouth daily. 90 tablet 1  . aspirin 81 MG tablet Take 81 mg by mouth daily.    [name] atorvastatin (LIPITOR) 80 MG tablet TAKE 1 TABLET DAILY 90 tablet 3  . blood glucose meter kit and supplies USE TO CHECK BLOOD SUGARS UP TO TWO TIMES DAILY 1 each 0  . Blood Glucose Monitoring Suppl (FREESTYLE FREEDOM LITE) w/Device KIT 1  application by Does not apply route 2 (two) times daily. 1 kit 0  . glucose blood (FREESTYLE LITE) test strip USE TO CHECK SUGARS TWICE A DAY 200 each 1  . hydrochlorothiazide (HYDRODIURIL) 25 MG tablet TAKE 1 TABLET DAILY 90 tablet 3  . JANUVIA 100 MG tablet TAKE 1 TABLET DAILY 90 tablet 3  . Krill Oil 300 MG CAPS Take 1 capsule by mouth daily.    . Lancets (FREESTYLE) lancets CHECK SUGAR TWICE A DAY 200 each 0  . metFORMIN (GLUCOPHAGE) 1000 MG tablet Take 1 tablet (1,000 mg total) by mouth 2 (two) times daily with a meal. 180 tablet 0  . Multiple Vitamins-Minerals (CENTRUM SILVER ADULT 50+ PO) Take 1 tablet by mouth daily.    [name] telmisartan (MICARDIS) 40 MG tablet TAKE 1 TABLET AT BEDTIME 90 tablet 3   No facility-administered medications prior to visit.    Review of Systems;  Patient denies headache, fevers, malaise, unintentional weight loss, skin rash, eye pain, sinus congestion and sinus pain, sore throat, dysphagia,  hemoptysis , cough, dyspnea, wheezing, chest pain, palpitations, orthopnea, edema, abdominal pain, nausea, melena, diarrhea, constipation, flank pain, dysuria, hematuria, urinary  Frequency, nocturia, numbness, tingling, seizures,  Focal weakness, Loss of consciousness,  Tremor, insomnia, depression, anxiety, and suicidal ideation.      Objective:  BP 124/72 (BP Location: Left Arm, Patient Position: Sitting, Cuff Size: Normal)   Pulse 83   Temp 98.1 F (36.7 C) (Temporal)  Resp 15   Ht 5' 8"  (1.727 m)   Wt 194 lb 12.8 oz (88.4 kg)   SpO2 98%   BMI 29.62 kg/m   BP Readings from Last 3 Encounters:  12/17/19 124/72  11/12/19 132/74  10/15/19 134/74    Wt Readings from Last 3 Encounters:  12/17/19 194 lb 12.8 oz (88.4 kg)  11/12/19 197 lb (89.4 kg)  10/15/19 201 lb 6.4 oz (91.4 kg)    General appearance: alert, cooperative and appears stated age Ears: normal TM's and external ear canals both ears Throat: lips, mucosa, and tongue normal; teeth and gums  normal Neck: no adenopathy, no carotid bruit, supple, symmetrical, trachea midline and thyroid not enlarged, symmetric, no tenderness/mass/nodules Back: symmetric, no curvature. ROM normal. No CVA tenderness. Lungs: clear to auscultation bilaterally Heart: regular rate and rhythm, S1, S2 normal, no murmur, click, rub or gallop Abdomen: soft, non-tender; bowel sounds normal; no masses,  no organomegaly Pulses: 2+ and symmetric Skin: Skin color, texture, turgor normal. No rashes or lesions Lymph nodes: Cervical, supraclavicular, and axillary nodes normal.  Lab Results  Component Value Date   HGBA1C 8.0 (H) 10/08/2019   HGBA1C 7.6 (H) 07/03/2019   HGBA1C 7.3 (H) 01/16/2019    Lab Results  Component Value Date   CREATININE 0.87 10/08/2019   CREATININE 0.96 07/03/2019   CREATININE 0.92 01/16/2019    Lab Results  Component Value Date   WBC 5.4 06/07/2018   HGB 14.2 06/07/2018   HCT 43.6 06/07/2018   PLT 170.0 06/07/2018   GLUCOSE 156 (H) 10/08/2019   CHOL 128 10/08/2019   TRIG 95.0 10/08/2019   HDL 46.60 10/08/2019   LDLDIRECT 59.0 08/31/2016   LDLCALC 63 10/08/2019   ALT 39 10/08/2019   AST 26 10/08/2019   NA 136 10/08/2019   K 4.1 10/08/2019   CL 101 10/08/2019   CREATININE 0.87 10/08/2019   BUN 13 10/08/2019   CO2 27 10/08/2019   PSA 2.48 07/03/2019   INR 1.1 (H) 10/09/2018   HGBA1C 8.0 (H) 10/08/2019   MICROALBUR 2.0 (H) 01/16/2019    No results found.  Assessment & Plan:   Problem List Items Addressed This Visit      Unprioritized   Diabetic nephropathy associated with type 2 diabetes mellitus ([place])    He is Tolerating change from lisinopril to telmisartan  Lab Results  Component Value Date   MICROALBUR 2.0 (H) 01/16/2019         DM type 2, not at goal [facility]) - Primary    Glycemic control has improved with exercise and more attention to diet.  No changes to regimen today.  Encouraged to walk after his largest meal daily   Lab Results  Component  Value Date   HGBA1C 8.0 (H) 10/08/2019   Lab Results  Component Value Date   MICROALBUR 2.0 (H) 01/16/2019   MICROALBUR <0.7 06/07/2018           Relevant Orders   Hemoglobin A1c   Comprehensive metabolic panel   Lipid panel   Microalbumin / creatinine urine ratio   Essential hypertension    Well controlled on current regimen of telmisartan,  hct and amlodipine . Renal function stable, no changes today.  Lab Results  Component Value Date   NA 136 10/08/2019   K 4.1 10/08/2019   CL 101 10/08/2019   CO2 27 10/08/2019   Lab Results  Component Value Date   CREATININE 0.87 10/08/2019  I am having Robert Robertson. maintain his aspirin, Robert Robertson, Multiple Vitamins-Minerals (CENTRUM SILVER ADULT 50+ PO), freestyle, atorvastatin, telmisartan, Januvia, FREESTYLE LITE, blood glucose meter kit and supplies, FreeStyle Freedom Lite, hydrochlorothiazide, metFORMIN, and amLODipine.  No orders of the defined types were placed in this encounter.   There are no discontinued medications.  Follow-up: Return in about 2 months (around 02/27/2020) for follow up diabetes.   [name], MD

## 2019-12-17 NOTE — Patient Instructions (Addendum)
Blood pressure and blood sugars are much better. [name]  No changes needed to regimen   Go for a walk after you indulge to help lower the post prandials    [employer] has a line of low carb power bowls that are 12 net carbs   [employer]  Makes a low carb "cauliflower pizza bowl"  That is high in sodium but very low carb and great tasting    Fasting labs again in September prior to visit

## 2019-12-18 NOTE — Assessment & Plan Note (Signed)
Well controlled on current regimen of telmisartan,  hct and amlodipine . Renal function stable, no changes today.  Lab Results  Component Value Date   NA 136 10/08/2019   K 4.1 10/08/2019   CL 101 10/08/2019   CO2 27 10/08/2019   Lab Results  Component Value Date   CREATININE 0.87 10/08/2019

## 2019-12-18 NOTE — Assessment & Plan Note (Addendum)
Glycemic control has improved with exercise and more attention to diet.  No changes to regimen today.  Encouraged to walk after his largest meal daily   Lab Results  Component Value Date   HGBA1C 8.0 (H) 10/08/2019   Lab Results  Component Value Date   MICROALBUR 2.0 (H) 01/16/2019   MICROALBUR <0.7 06/07/2018

## 2019-12-18 NOTE — Assessment & Plan Note (Signed)
He is Tolerating change from lisinopril to telmisartan  Lab Results  Component Value Date   MICROALBUR 2.0 (H) 01/16/2019

## 2019-12-24 ENCOUNTER — Other Ambulatory Visit: Payer: Self-pay

## 2019-12-24 ENCOUNTER — Other Ambulatory Visit: Payer: Self-pay | Admitting: Nurse Practitioner

## 2019-12-24 ENCOUNTER — Ambulatory Visit (INDEPENDENT_AMBULATORY_CARE_PROVIDER_SITE_OTHER): Payer: Medicare Other | Admitting: Podiatry

## 2019-12-24 ENCOUNTER — Encounter: Payer: Self-pay | Admitting: Podiatry

## 2019-12-24 VITALS — BP 121/72 | HR 93

## 2019-12-24 DIAGNOSIS — S93402A Sprain of unspecified ligament of left ankle, initial encounter: Secondary | ICD-10-CM | POA: Diagnosis not present

## 2019-12-24 DIAGNOSIS — K760 Fatty (change of) liver, not elsewhere classified: Secondary | ICD-10-CM

## 2019-12-24 DIAGNOSIS — IMO0001 Reserved for inherently not codable concepts without codable children: Secondary | ICD-10-CM

## 2019-12-25 ENCOUNTER — Telehealth: Payer: Self-pay | Admitting: Internal Medicine

## 2019-12-25 ENCOUNTER — Encounter: Payer: Self-pay | Admitting: Podiatry

## 2019-12-25 MED ORDER — FREESTYLE LITE TEST VI STRP: ORAL_STRIP | 1 refills | Status: DC

## 2019-12-25 NOTE — Progress Notes (Signed)
Subjective:  Patient ID: [name], male    DOB: Dec 30, 1950,  MRN: [id number]  Chief Complaint  Patient presents with  . Foot Pain    injury to left ankle after twisting in April, has seen pcp, xrays taken, no broken bones per primary    69 y.o. male presents with the above complaint.  Patient presents with a follow-up after he was seen to the emergency room in April for twisting his foot.  Patient stated got really swollen however in the emergency room couple of months ago no acute fractures were noted.  He just wants to follow-up to make sure everything is okay as he has neuropathy to the left lower extremity he is not able to feel pain that well.  He denies any other pain he denies any pain anywhere in the foot.  He has not seen anyone else prior to seeing me aside from the emergency room.  He denies any treatment options.  The injury occurred after he stepped on a curb and twisted his ankle on April 20.   Review of Systems: Negative except as noted in the HPI. Denies N/V/F/Ch.  Past Medical History:  Diagnosis Date  . Allergy   . Colon polyp   . Diabetes mellitus   . Hyperlipidemia   . Hypertension     Current Outpatient Medications:  .  amLODipine (NORVASC) 10 MG tablet, Take 1 tablet (10 mg total) by mouth daily., Disp: 90 tablet, Rfl: 1 .  aspirin 81 MG tablet, Take 81 mg by mouth daily., Disp: , Rfl:  .  atorvastatin (LIPITOR) 80 MG tablet, TAKE 1 TABLET DAILY, Disp: 90 tablet, Rfl: 3 .  blood glucose meter kit and supplies, USE TO CHECK BLOOD SUGARS UP TO TWO TIMES DAILY, Disp: 1 each, Rfl: 0 .  Blood Glucose Monitoring Suppl (FREESTYLE FREEDOM LITE) w/Device KIT, 1 application by Does not apply route 2 (two) times daily., Disp: 1 kit, Rfl: 0 .  glucose blood (FREESTYLE LITE) test strip, USE TO CHECK SUGARS TWICE A DAY, Disp: 200 each, Rfl: 1 .  hydrochlorothiazide (HYDRODIURIL) 25 MG tablet, TAKE 1 TABLET DAILY, Disp: 90 tablet, Rfl: 3 .  JANUVIA 100 MG tablet,  TAKE 1 TABLET DAILY, Disp: 90 tablet, Rfl: 3 .  Krill Oil 300 MG CAPS, Take 1 capsule by mouth daily., Disp: , Rfl:  .  Lancets (FREESTYLE) lancets, CHECK SUGAR TWICE A DAY, Disp: 200 each, Rfl: 0 .  metFORMIN (GLUCOPHAGE) 1000 MG tablet, Take 1 tablet (1,000 mg total) by mouth 2 (two) times daily with a meal., Disp: 180 tablet, Rfl: 0 .  Multiple Vitamins-Minerals (CENTRUM SILVER ADULT 50+ PO), Take 1 tablet by mouth daily., Disp: , Rfl:  .  telmisartan (MICARDIS) 40 MG tablet, TAKE 1 TABLET AT BEDTIME, Disp: 90 tablet, Rfl: 3  Social History   Tobacco Use  Smoking Status Former Smoker  . Packs/day: 1.00  . Years: 30.00  . Pack years: 30.00  . Types: Cigarettes  Smokeless Tobacco Never Used    Allergies  Allergen Reactions  . Pollen Extract Other (See Comments)    Hay-fever. Reaction: Sneezing   Objective:   Vitals:   12/24/19 1456  BP: 121/72  Pulse: 93   There is no height or weight on file to calculate BMI. Constitutional Well developed. Well nourished.  Vascular Dorsalis pedis pulses palpable bilaterally. Posterior tibial pulses palpable bilaterally. Capillary refill normal to all digits.  No cyanosis or clubbing noted. Pedal hair growth normal.  Neurologic Normal speech. Oriented to person, place, and time. Epicritic sensation to light touch grossly present bilaterally.  Dermatologic Nails well groomed and normal in appearance. No open wounds. No skin lesions.  Orthopedic:  No pain on palpation to the ATFL, peroneal, Achilles tendon, posterior tibial tendon.  Good range of motion noted of the ankle joint.  No intra-articular pain noted at the ankle joint.  No generalized pain noted to the left lower extremity.  Negative anterior drawer sign or talar tilt.   Radiographs: Prior x-rays were reviewed I agree with their findings without no acute fractures noted. Assessment:   1. Grade 1 ankle sprain, left, initial encounter    Plan:  Patient was evaluated and  treated and all questions answered.  Left ankle sprain grade 1 -I explained patient the etiology of ankle sprain and various treatment options were discussed.  Clinically patient does not have any ankle instability or anterior drawer sign or talar tilt therefore I'm inclined to discontinue any kind of treatment options for this.  Given the patient also does not have any radiographic evidence of fractures, patient does not need any treatment for this. -I discussed this with the patient patient states understanding that he can continue without any restrictions.  No follow-ups on file.

## 2019-12-25 NOTE — Telephone Encounter (Signed)
Pt needs a refill of Lancets (FREESTYLE) [facility]ets [facility]

## 2019-12-26 ENCOUNTER — Ambulatory Visit
Admission: RE | Admit: 2019-12-26 | Discharge: 2019-12-26 | Disposition: A | Discharge status: Home / Self Care | Payer: Medicare Other | Source: Ambulatory Visit | Attending: Nurse Practitioner | Admitting: Nurse Practitioner

## 2019-12-26 ENCOUNTER — Other Ambulatory Visit: Payer: Self-pay

## 2019-12-26 DIAGNOSIS — K760 Fatty (change of) liver, not elsewhere classified: Secondary | ICD-10-CM

## 2019-12-26 IMAGING — US US ABDOMEN LIMITED
1 series · 14 of 25 positions shown · non-contrast
Comparison: [DATE]

CLINICAL DATA: Nonalcoholic steatohepatitis

EXAM:
ULTRASOUND ABDOMEN LIMITED RIGHT UPPER QUADRANT

[Series 1: us abdomen limited · 0.23mm/px · 14 of 58 slices shown]
[im 1/58]
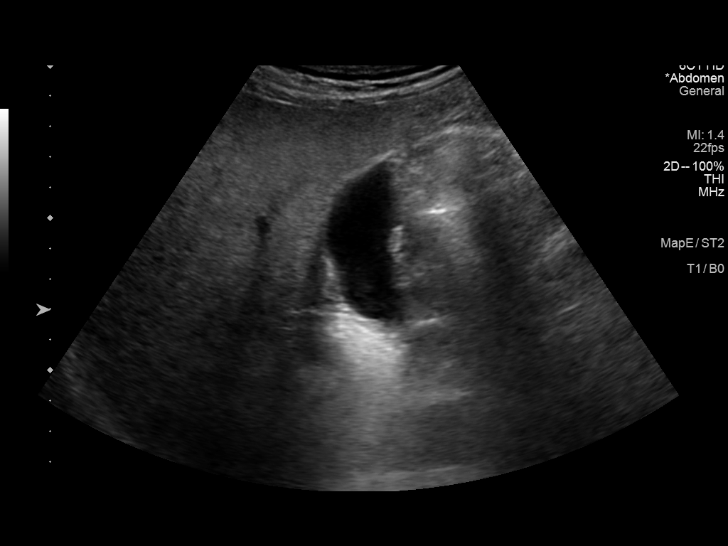
[im 5/58]
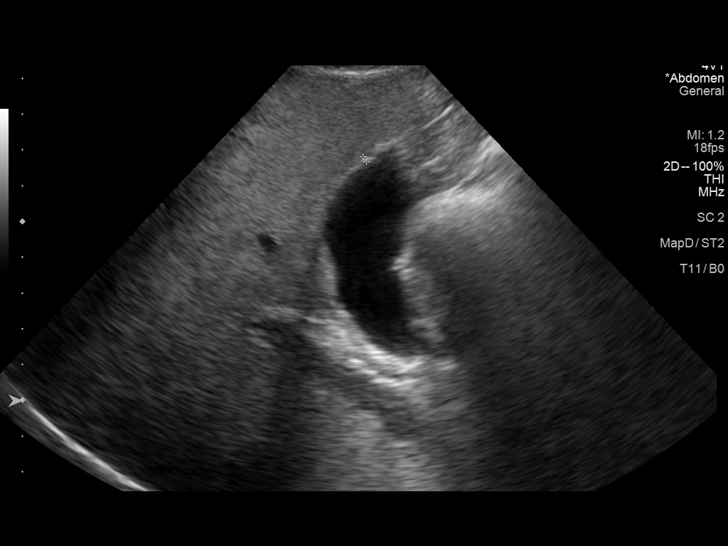
[im 10/58]
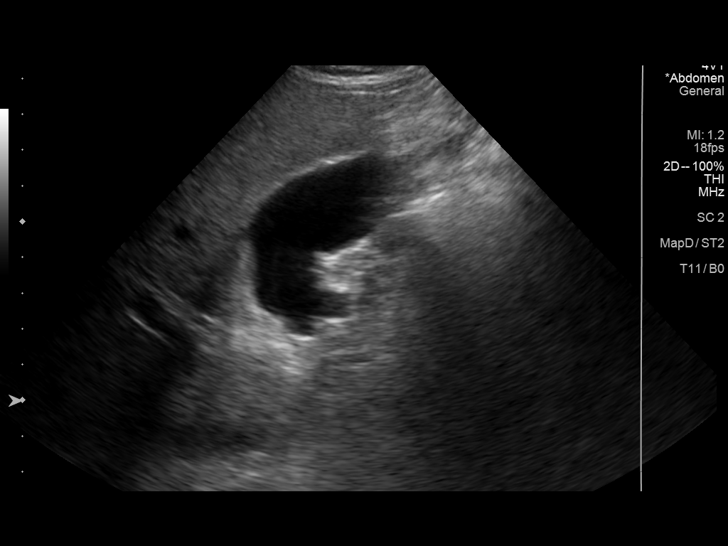
[im 15/58]
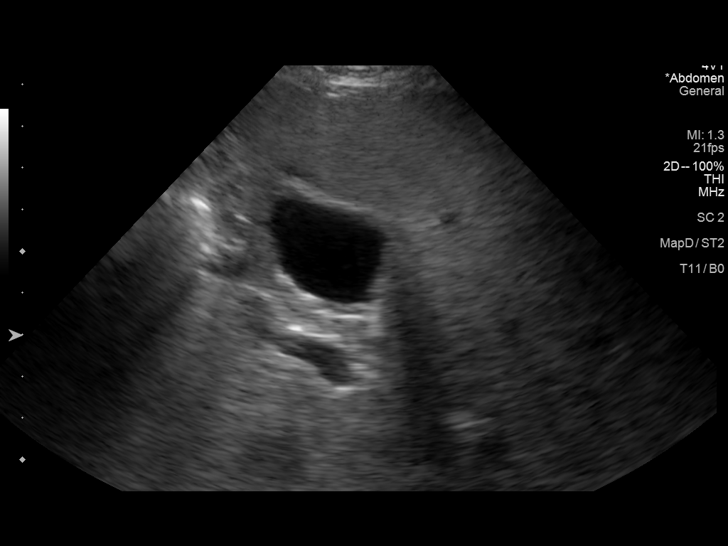
[im 20/58]
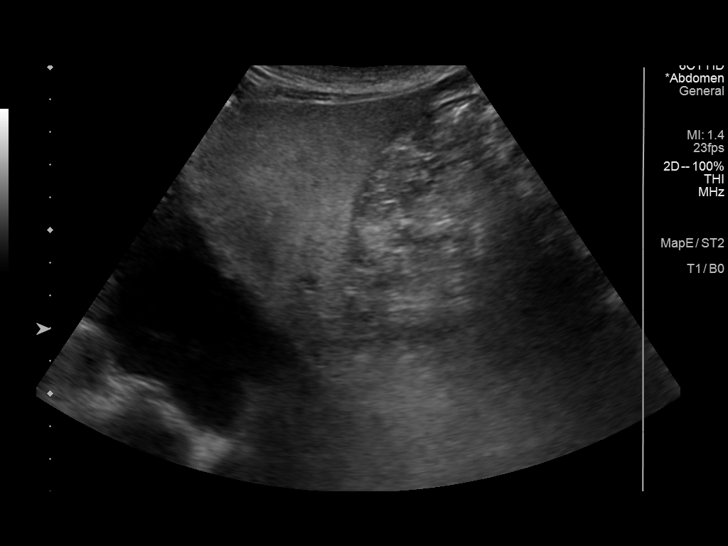
[im 22/58]
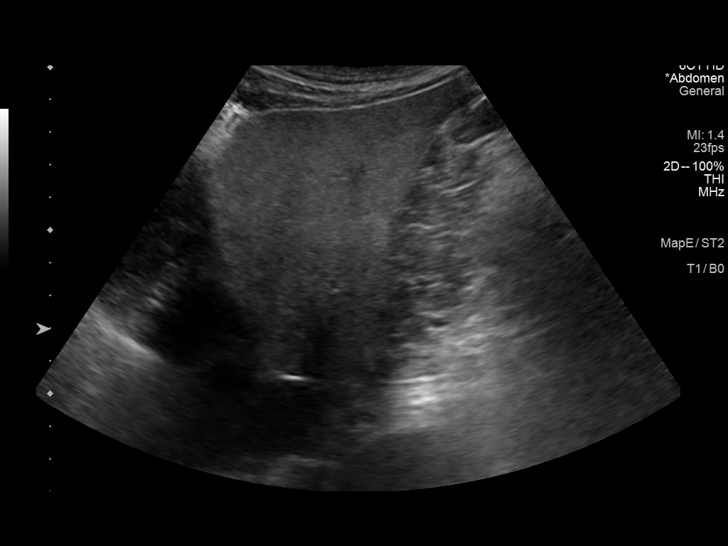
[im 27/58]
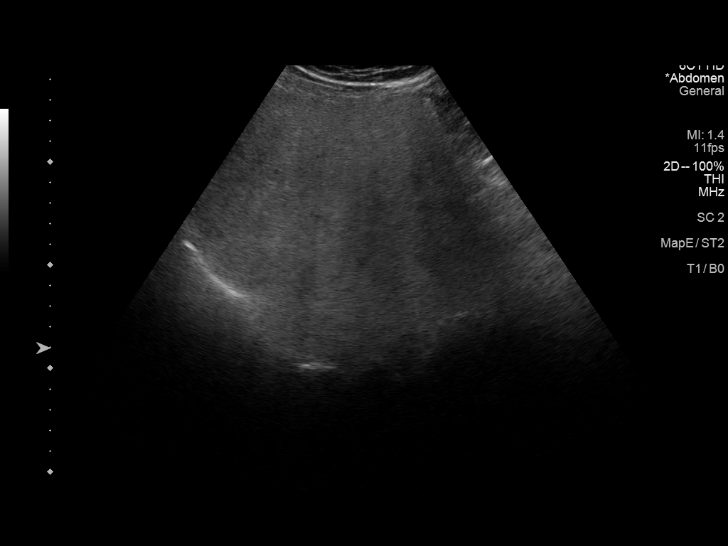
[im 31/58]
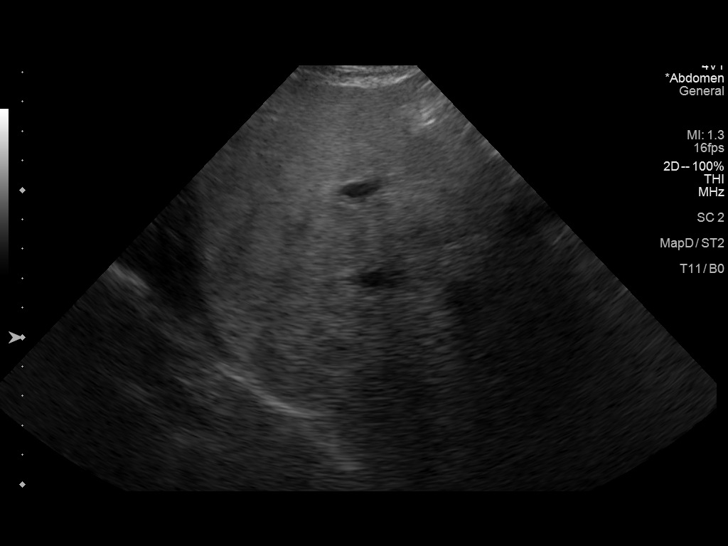
[im 36/58]
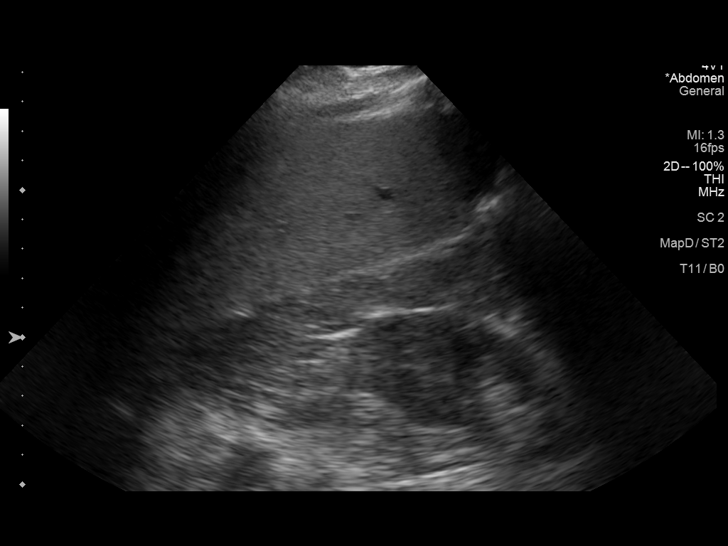
[im 39/58]
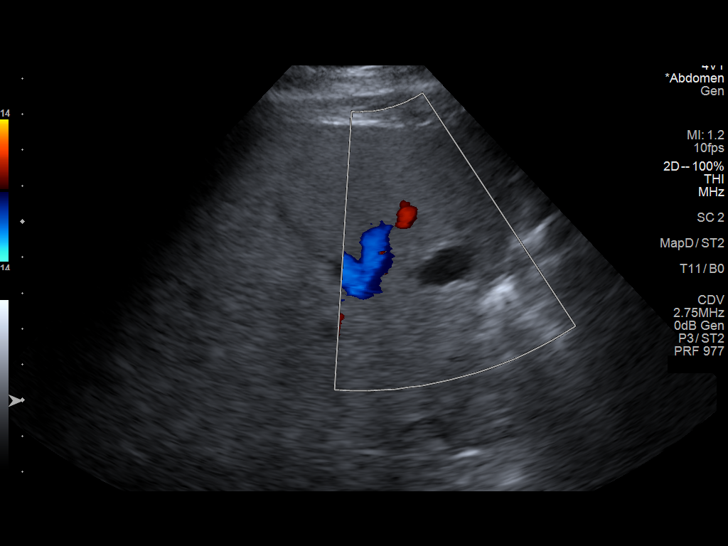
[im 43/58]
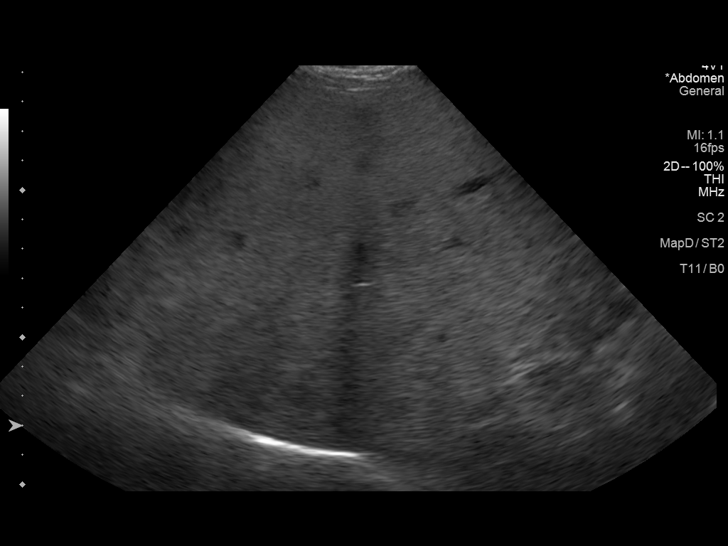
[im 48/58]
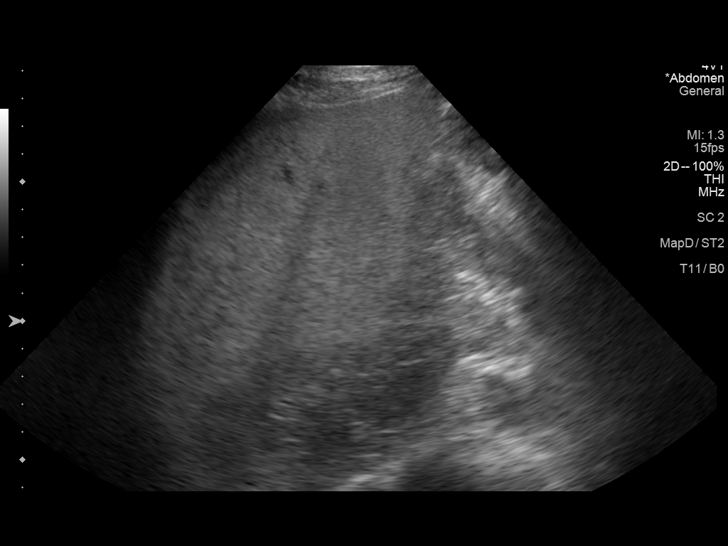
[im 53/58]
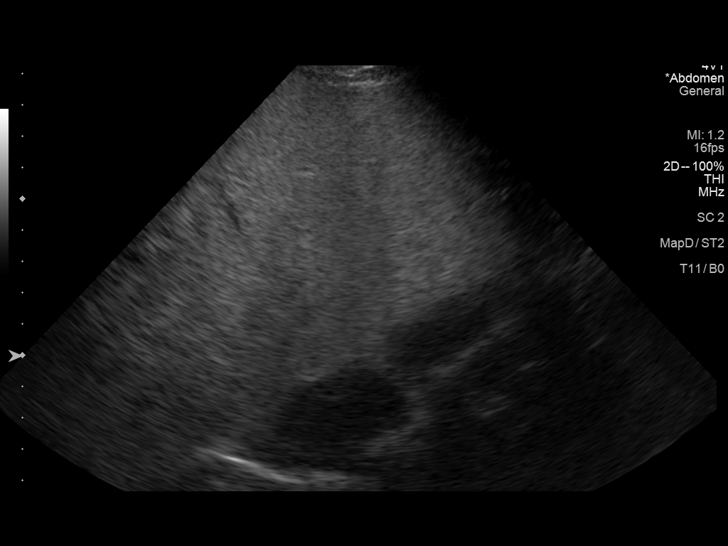
[im 58/58]
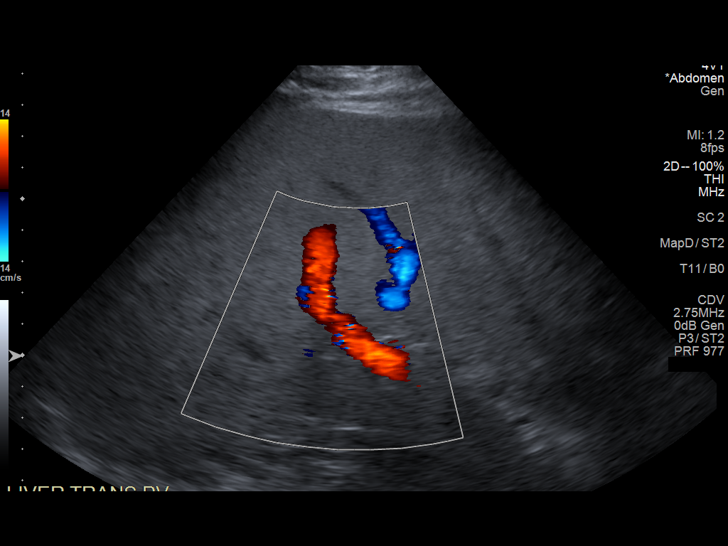

[14 of 25 positions shown; findings below may reference images not displayed]

FINDINGS: Gallbladder:

No gallstones or wall thickening visualized. There is no
pericholecystic fluid. No sonographic Murphy sign noted by
sonographer.

Common bile duct:

Diameter: 4 mm. No intrahepatic or extrahepatic biliary duct
dilatation.

Liver:

There is a cyst in the right lobe of the liver measuring 1.5 x 1.5 x
1.3 cm, also noted previously. Liver echogenicity is increased
diffusely. Portal vein is patent on color Doppler imaging with
normal direction of blood flow towards the liver.

Other: None.
IMPRESSION: Diffuse increase in liver echogenicity, a finding indicative of
hepatic steatosis, similar to appearance on prior study. There is a
stable cyst in the right lobe of the liver. No other focal liver
lesion evident. Note that the sensitivity of ultrasound for
detection of noncystic liver lesions is diminished significantly in
this circumstance.

Study otherwise unremarkable.

## 2019-12-26 MED ORDER — FREESTYLE LANCETS MISC: 0 refills | Status: DC

## 2020-02-06 ENCOUNTER — Ambulatory Visit (INDEPENDENT_AMBULATORY_CARE_PROVIDER_SITE_OTHER): Payer: Medicare Other

## 2020-02-06 VITALS — BP 116/79 | HR 83 | Ht 68.0 in | Wt 194.0 lb

## 2020-02-06 DIAGNOSIS — Z0000 Encounter for general adult medical examination without abnormal findings: Secondary | ICD-10-CM | POA: Diagnosis not present

## 2020-02-06 NOTE — Patient Instructions (Addendum)
Robert Robertson , Thank you for taking time to come for your Medicare Wellness Visit. I appreciate your ongoing commitment to your health goals. Please review the following plan we discussed and let me know if I can assist you in the future.   These are the goals we discussed: Goals      Patient Stated   .  Follow up with Primary Care Provider (pt-stated)      Monitor A1C twice daily         This is a list of the screening recommended for you and due dates:  Health Maintenance  Topic Date Due  . Flu Shot  01/27/2020  . Hemoglobin A1C  04/08/2020  . Complete foot exam   11/11/2020  . Eye exam for diabetics  12/03/2020  . Colon Cancer Screening  07/17/2024  . Tetanus Vaccine  08/07/2024  . COVID-19 Vaccine  Completed  .  Hepatitis C: One time screening is recommended by Center for Disease Control  (CDC) for  adults born from [birth year] through 1965.   Completed  . Pneumonia vaccines  Completed   Advanced directives: End of life planning; Advance aging; Advanced directives discussed.  Copy of current HCPOA/Living Will requested.    Keep all routine maintenance appointments.   Next scheduled lab 03/04/20  Follow up 03/06/20  Follow up in one year for your annual wellness visit.   Preventive Care 69 Years and Older, Male Preventive care refers to lifestyle choices and visits with your health care provider that can promote health and wellness. What does preventive care include?  A yearly physical exam. This is also called an annual well check.  Dental exams once or twice a year.  Routine eye exams. Ask your health care provider how often you should have your eyes checked.  Personal lifestyle choices, including:  Daily care of your teeth and gums.  Regular physical activity.  Eating a healthy diet.  Avoiding tobacco and drug use.  Limiting alcohol use.  Practicing safe sex.  Taking low doses of aspirin every day.  Taking vitamin and mineral supplements as recommended by  your health care provider. What happens during an annual well check? The services and screenings done by your health care provider during your annual well check will depend on your age, overall health, lifestyle risk factors, and family history of disease. Counseling  Your health care provider may ask you questions about your:  Alcohol use.  Tobacco use.  Drug use.  Emotional well-being.  Home and relationship well-being.  Sexual activity.  Eating habits.  History of falls.  Memory and ability to understand (cognition).  Work and work Statistician. Screening  You may have the following tests or measurements:  Height, weight, and BMI.  Blood pressure.  Lipid and cholesterol levels. These may be checked every 5 years, or more frequently if you are over 5 years old.  Skin check.  Lung cancer screening. You may have this screening every year starting at age 69 if you have a 30-pack-year history of smoking and currently smoke or have quit within the past 15 years.  Fecal occult blood test (FOBT) of the stool. You may have this test every year starting at age 69.  Flexible sigmoidoscopy or colonoscopy. You may have a sigmoidoscopy every 5 years or a colonoscopy every 10 years starting at age 69.  Prostate cancer screening. Recommendations will vary depending on your family history and other risks.  Hepatitis C blood test.  Hepatitis B blood test.  Sexually transmitted disease (STD) testing.  Diabetes screening. This is done by checking your blood sugar (glucose) after you have not eaten for a while (fasting). You may have this done every 1-3 years.  Abdominal aortic aneurysm (AAA) screening. You may need this if you are a current or former smoker.  Osteoporosis. You may be screened starting at age 69 if you are at high risk. Talk with your health care provider about your test results, treatment options, and if necessary, the need for more tests. Vaccines  Your  health care provider may recommend certain vaccines, such as:  Influenza vaccine. This is recommended every year.  Tetanus, diphtheria, and acellular pertussis (Tdap, Td) vaccine. You may need a Td booster every 10 years.  Zoster vaccine. You may need this after age 69.  Pneumococcal 13-valent conjugate (PCV13) vaccine. One dose is recommended after age 69.  Pneumococcal polysaccharide (PPSV23) vaccine. One dose is recommended after age 16. Talk to your health care provider about which screenings and vaccines you need and how often you need them. This information is not intended to replace advice given to you by your health care provider. Make sure you discuss any questions you have with your health care provider. Document Released: 07/11/2015 Document Revised: 03/03/2016 Document Reviewed: 04/15/2015 Elsevier Interactive Patient Education  2017 [place] Prevention in the Home Falls can cause injuries. They can happen to people of all ages. There are many things you can do to make your home safe and to help prevent falls. What can I do on the outside of my home?  Regularly fix the edges of walkways and driveways and fix any cracks.  Remove anything that might make you trip as you walk through a door, such as a raised step or threshold.  Trim any bushes or trees on the path to your home.  Use bright outdoor lighting.  Clear any walking paths of anything that might make someone trip, such as rocks or tools.  Regularly check to see if handrails are loose or broken. Make sure that both sides of any steps have handrails.  Any raised decks and porches should have guardrails on the edges.  Have any leaves, snow, or ice cleared regularly.  Use sand or salt on walking paths during winter.  Clean up any spills in your garage right away. This includes oil or grease spills. What can I do in the bathroom?  Use night lights.  Install grab bars by the toilet and in the tub and  shower. Do not use towel bars as grab bars.  Use non-skid mats or decals in the tub or shower.  If you need to sit down in the shower, use a plastic, non-slip stool.  Keep the floor dry. Clean up any water that spills on the floor as soon as it happens.  Remove soap buildup in the tub or shower regularly.  Attach bath mats securely with double-sided non-slip rug tape.  Do not have throw rugs and other things on the floor that can make you trip. What can I do in the bedroom?  Use night lights.  Make sure that you have a light by your bed that is easy to reach.  Do not use any sheets or blankets that are too big for your bed. They should not hang down onto the floor.  Have a firm chair that has side arms. You can use this for support while you get dressed.  Do not have throw rugs and other  things on the floor that can make you trip. What can I do in the kitchen?  Clean up any spills right away.  Avoid walking on wet floors.  Keep items that you use a lot in easy-to-reach places.  If you need to reach something above you, use a strong step stool that has a grab bar.  Keep electrical cords out of the way.  Do not use floor polish or wax that makes floors slippery. If you must use wax, use non-skid floor wax.  Do not have throw rugs and other things on the floor that can make you trip. What can I do with my stairs?  Do not leave any items on the stairs.  Make sure that there are handrails on both sides of the stairs and use them. Fix handrails that are broken or loose. Make sure that handrails are as long as the stairways.  Check any carpeting to make sure that it is firmly attached to the stairs. Fix any carpet that is loose or worn.  Avoid having throw rugs at the top or bottom of the stairs. If you do have throw rugs, attach them to the floor with carpet tape.  Make sure that you have a light switch at the top of the stairs and the bottom of the stairs. If you do not  have them, ask someone to add them for you. What else can I do to help prevent falls?  Wear shoes that:  Do not have high heels.  Have rubber bottoms.  Are comfortable and fit you well.  Are closed at the toe. Do not wear sandals.  If you use a stepladder:  Make sure that it is fully opened. Do not climb a closed stepladder.  Make sure that both sides of the stepladder are locked into place.  Ask someone to hold it for you, if possible.  Clearly mark and make sure that you can see:  Any grab bars or handrails.  First and last steps.  Where the edge of each step is.  Use tools that help you move around (mobility aids) if they are needed. These include:  Canes.  Walkers.  Scooters.  Crutches.  Turn on the lights when you go into a dark area. Replace any light bulbs as soon as they burn out.  Set up your furniture so you have a clear path. Avoid moving your furniture around.  If any of your floors are uneven, fix them.  If there are any pets around you, be aware of where they are.  Review your medicines with your doctor. Some medicines can make you feel dizzy. This can increase your chance of falling. Ask your doctor what other things that you can do to help prevent falls. This information is not intended to replace advice given to you by your health care provider. Make sure you discuss any questions you have with your health care provider. Document Released: 04/10/2009 Document Revised: 11/20/2015 Document Reviewed: 07/19/2014 Elsevier Interactive Patient Education  2017 [employer].

## 2020-02-06 NOTE — Progress Notes (Addendum)
Subjective:   Robert Robertson. is a 69 y.o. male who presents for Medicare Annual/Subsequent preventive examination.  Review of Systems    No ROS.  Medicare Wellness Virtual Visit.  Cardiac Risk Factors include: advanced age (>29mn, >>11women);hypertension;diabetes mellitus;male gender     Objective:    Today's Vitals   02/06/20 0936  BP: 116/79  Pulse: 83  Weight: 194 lb (88 kg)  Height: _0  (1.727 m)   Body mass index is 29.5 kg/m.  Advanced Directives 02/06/2020 07/18/2019 02/05/2019 07/19/2018 01/26/2018 12/28/2016  Does Patient Have a Medical Advance Directive? Yes No Yes No No No  Type of AAcademic librarian- Healthcare Power of Attorney - - -  Does patient want to make changes to medical advance directive? No - Patient declined - No - Patient declined - - -  [place]? No - copy requested - No - copy requested - - -  Would patient like information on creating a medical advance directive? - No - Patient declined - No - Patient declined No - Patient declined Yes (MAU/Ambulatory/Procedural Areas - Information given)    Current Medications (verified) Outpatient Encounter Medications as of 02/06/2020  Medication Sig   amLODipine (NORVASC) 10 MG tablet Take 1 tablet (10 mg total) by mouth daily.   aspirin 81 MG tablet Take 81 mg by mouth daily.   atorvastatin (LIPITOR) 80 MG tablet TAKE 1 TABLET DAILY   blood glucose meter kit and supplies USE TO CHECK BLOOD SUGARS UP TO TWO TIMES DAILY   Blood Glucose Monitoring Suppl (FREESTYLE FREEDOM LITE) w/Device KIT 1 application by Does not apply route 2 (two) times daily.   glucose blood (FREESTYLE LITE) test strip USE TO CHECK SUGARS TWICE A DAY   hydrochlorothiazide (HYDRODIURIL) 25 MG tablet TAKE 1 TABLET DAILY   JANUVIA 100 MG tablet TAKE 1 TABLET DAILY   Krill Oil 300 MG CAPS Take 1 capsule by mouth daily.   Lancets (FREESTYLE) lancets CHECK SUGAR TWICE A DAY    metFORMIN (GLUCOPHAGE) 1000 MG tablet Take 1 tablet (1,000 mg total) by mouth 2 (two) times daily with a meal.   Multiple Vitamins-Minerals (CENTRUM SILVER ADULT 50+ PO) Take 1 tablet by mouth daily.   telmisartan (MICARDIS) 40 MG tablet TAKE 1 TABLET AT BEDTIME   No facility-administered encounter medications on file as of 02/06/2020.    Allergies (verified) Pollen extract   History: Past Medical History:  Diagnosis Date   Allergy    Colon polyp    Diabetes mellitus    Hyperlipidemia    Hypertension    Past Surgical History:  Procedure Laterality Date   COLONOSCOPY  2010   10 mm adenomatous polyp in the descending colon without atypia, DLucilla Lame MD   COLONOSCOPY WITH PROPOFOL N/A 07/18/2019   Procedure: COLONOSCOPY WITH PROPOFOL;  Surgeon: BRobert Bellow MD;  [facility]  Service: Endoscopy;  Laterality: N/A;   Family History  Problem Relation Age of Onset   Diabetes Mother    Stroke Mother    Colon cancer Mother    Diabetes Brother    Diabetes Maternal Grandfather    Diabetes Maternal Grandmother    Cancer Sister        brain tumor   Social History   Socioeconomic History   Marital status: Married    Spouse name: Not on file   Number of children: Not on file   Years of  education: Not on file   Highest education level: Not on file  Occupational History   Not on file  Tobacco Use   Smoking status: Former Smoker    Packs/day: 1.00    Years: 30.00    Pack years: 30.00    Types: Cigarettes   Smokeless tobacco: Never Used  Vaping Use   Vaping Use: Never used  Substance and Sexual Activity   Alcohol use: Yes    Alcohol/week: 14.0 standard drinks    Types: 14 Glasses of wine per week    Comment: daily   Drug use: No   Sexual activity: Not on file  Other Topics Concern   Not on file  Social History Narrative   Not on file   Social Determinants of Health   Financial Resource Strain: Low Risk    Difficulty of Paying Living Expenses:  Not hard at all  Food Insecurity: No Food Insecurity   Worried About Charity fundraiser in the Last Year: Never true   [place] in the Last Year: Never true  Transportation Needs: No Transportation Needs   Lack of Transportation (Medical): No   Lack of Transportation (Non-Medical): No  Physical Activity:    Days of Exercise per Week:    Minutes of Exercise per Session:   Stress: No Stress Concern Present   Feeling of Stress : Not at all  Social Connections:    Frequency of Communication with Friends and Family:    Frequency of Social Gatherings with Friends and Family:    Attends Religious Services:    Active Member of Clubs or Organizations:    Attends Music therapist:    Marital Status:     Tobacco Counseling Counseling given: Not Answered   Clinical Intake:  Pre-visit preparation completed: Yes        Diabetes: Yes (Followed by pcp)  How often do you need to have someone help you when you read instructions, pamphlets, or other written materials from your doctor or pharmacy?: 1 - Never  Interpreter Needed?: No      Activities of Daily Living In your present state of health, do you have any difficulty performing the following activities: 02/06/2020  Hearing? N  Vision? N  Difficulty concentrating or making decisions? N  Walking or climbing stairs? N  Dressing or bathing? N  Doing errands, shopping? N  Preparing Food and eating ? N  Using the Toilet? N  In the past six months, have you accidently leaked urine? N  Do you have problems with loss of bowel control? N  Managing your Medications? N  Managing your Finances? N  Housekeeping or managing your Housekeeping? N  Some recent data might be hidden    Patient Care Team: [name], MD as PCP - General (Internal Medicine) [name], MD (Internal Medicine) Bary Castilla Forest Gleason, MD (General Surgery)  Indicate any recent Medical Services you may have received from other than  [facility] providers in the past year (date may be approximate).     Assessment:   This is a routine wellness examination for Robert Robertson.  I connected with Robert Robertson today by telephone and verified that I am speaking with the correct person using two identifiers. Location patient: home Location provider: work Persons participating in the virtual visit: patient, Marine scientist.    I discussed the limitations, risks, security and privacy concerns of performing an evaluation and management service by telephone and the availability of in person appointments. The patient  expressed understanding and verbally consented to this telephonic visit.    Interactive audio and video telecommunications were attempted between this provider and patient, however failed, due to patient having technical difficulties OR patient did not have access to video capability.  We continued and completed visit with audio only.  Some vital signs may be absent or patient reported.   Hearing/Vision screen  Hearing Screening   125Hz 250Hz 500Hz 1000Hz 2000Hz 3000Hz 4000Hz 6000Hz 8000Hz  Right ear:           Left ear:           Comments: Patient is able to hear conversational tones without difficulty.  No issues reported.  Vision Screening Comments: Wears corrective lenses.  No retinopathy reported. Visual acuity not assessed, virtual visit. They have seen their ophthalmologist. Glaucoma suspect; no drops in use.    Dietary issues and exercise activities discussed: Current Exercise Habits: Home exercise routine, Intensity: Mild Regular diet Fair water intake  Goals       Patient Stated     Follow up with Primary Care Provider (pt-stated)      Monitor A1C twice daily         Depression Screen PHQ 2/9 Scores 02/06/2020 11/12/2019 02/05/2019 02/10/2018 01/26/2018 12/28/2016 12/15/2015  PHQ - 2 Score 0 0 0 0 0 0 0    Fall Risk Fall Risk  02/06/2020 12/17/2019 11/12/2019 11/12/2019 10/15/2019  Falls in the past year? (No Data) _0 0  Comment  No falls since last reported to pcp - - - -  Number falls in past yr: - 0 0 0 -  Injury with Fall? - _1 -  Risk for fall due to : - - History of fall(s);Impaired balance/gait - -  Follow up Falls evaluation completed Falls evaluation completed Falls prevention discussed Falls evaluation completed Falls evaluation completed   Handrails in use when climbing stairs? Yes  Home free of loose throw rugs in walkways, pet beds, electrical cords, etc? Yes  Adequate lighting in your home to reduce risk of falls? Yes   ASSISTIVE DEVICES UTILIZED TO PREVENT FALLS:  Life alert? No  Use of a cane, walker or w/c? No  Grab bars in the bathroom? No  Shower chair or bench in shower? No  Elevated toilet seat or a handicapped toilet? No   TIMED UP AND GO:  Was the test performed? No . Virtual visit.   Cognitive Function: Patient is alert and oriented x3.  Denies difficulty with focusing, making decisions, memory loss.  MMSE - Mini Mental State Exam 02/06/2020 01/26/2018 12/28/2016  Not completed: Unable to complete - -  Orientation to time - 5 5  Orientation to Place - 5 5  Registration - 3 3  Attention/ Calculation - 5 5  Recall - 3 3  Language- name 2 objects - 2 2  Language- repeat - 1 1  Language- follow 3 step command - 3 3  Language- read & follow direction - 1 1  Write a sentence - 1 1  Copy design - 1 1  Total score - 30 30     6CIT Screen 02/05/2019  What Year? 0 points  What month? 0 points  What time? 0 points  Count back from 20 0 points  Months in reverse 0 points  Repeat phrase 0 points  Total Score 0    Immunizations Immunization History  Administered Date(s) Administered   Fluad Quad(high Dose 65+) 07/03/2019   Hep A /  Hep B 05/07/2015, 06/10/2015, 11/04/2015   Influenza Split 04/28/2013   Influenza, High Dose Seasonal PF 02/27/2016, 07/07/2017, 06/08/2018   Influenza,inj,Quad PF,6+ Mos 05/08/2014, 05/07/2015   PFIZER SARS-COV-2 Vaccination 08/09/2019, 09/03/2019     Pneumococcal Conjugate-13 08/15/2015   Pneumococcal Polysaccharide-23 05/08/2014, 07/10/2019   Tdap 08/07/2014   Zoster 02/27/2016   Zoster Recombinat (Shingrix) 09/26/2019, 12/05/2019    Health Maintenance Health Maintenance  Topic Date Due   INFLUENZA VACCINE  01/27/2020   HEMOGLOBIN A1C  04/08/2020   FOOT EXAM  11/11/2020   OPHTHALMOLOGY EXAM  12/03/2020   COLONOSCOPY  07/17/2024   TETANUS/TDAP  08/07/2024   COVID-19 Vaccine  Completed   Hepatitis C Screening  Completed   PNA vac Low Risk Adult  Completed    Dental Screening: Recommended annual dental exams for proper oral hygiene.  Community Resource Referral / Chronic Care Management: CRR required this visit?  No   CCM required this visit?  No      Plan:   Keep all routine maintenance appointments.   Next scheduled lab 03/04/20  Follow up 03/06/20  I have personally reviewed and noted the following in the patients chart:   Medical and social history Use of alcohol, tobacco or illicit drugs  Current medications and supplements Functional ability and status Nutritional status Physical activity Advanced directives List of other physicians Hospitalizations, surgeries, and ER visits in previous 12 months Vitals Screenings to include cognitive, depression, and falls Referrals and appointments  In addition, I have reviewed and discussed with patient certain preventive protocols, quality metrics, and best practice recommendations. A written personalized care plan for preventive services as well as general preventive health recommendations were provided to patient via mychart.     OBrien-Blaney, Zhanae Proffit L, LPN   [id number]    I have reviewed the above information and agree with above.   Deborra Medina, MD

## 2020-03-04 ENCOUNTER — Other Ambulatory Visit: Payer: Self-pay

## 2020-03-04 ENCOUNTER — Other Ambulatory Visit (INDEPENDENT_AMBULATORY_CARE_PROVIDER_SITE_OTHER): Payer: Medicare Other

## 2020-03-04 DIAGNOSIS — E119 Type 2 diabetes mellitus without complications: Secondary | ICD-10-CM | POA: Diagnosis not present

## 2020-03-04 LAB — MICROALBUMIN / CREATININE URINE RATIO
Creatinine,U: 93.4 mg/dL
Microalb Creat Ratio: 0.7 mg/g (ref 0.0–30.0)
Microalb, Ur: <0.7 mg/dL (ref 0.0–1.9)

## 2020-03-04 LAB — COMPREHENSIVE METABOLIC PANEL
ALT: 35 U/L (ref 0–53)
AST: 29 U/L (ref 0–37)
Albumin: 4.5 g/dL (ref 3.5–5.2)
Alkaline Phosphatase: 59 U/L (ref 39–117)
BUN: 17 mg/dL (ref 6–23)
CO2: 28 mEq/L (ref 19–32)
Calcium: 9.3 mg/dL (ref 8.4–10.5)
Chloride: 101 mEq/L (ref 96–112)
Creatinine, Ser: 0.9 mg/dL (ref 0.40–1.50)
GFR: 101.15 mL/min (ref >60.00)
Glucose, Bld: 121 mg/dL — ABNORMAL HIGH (ref 70–99)
Potassium: 4 mEq/L (ref 3.5–5.1)
Sodium: 137 mEq/L (ref 135–145)
Total Bilirubin: 0.8 mg/dL (ref 0.2–1.2)
Total Protein: 6.8 g/dL (ref 6.0–8.3)

## 2020-03-04 LAB — LIPID PANEL
Cholesterol: 131 mg/dL (ref 0–200)
HDL: 51.4 mg/dL (ref >39.00)
LDL Cholesterol: 68 mg/dL (ref 0–99)
NonHDL: 79.69
Total CHOL/HDL Ratio: 3
Triglycerides: 58 mg/dL (ref 0.0–149.0)
VLDL: 11.6 mg/dL (ref 0.0–40.0)

## 2020-03-04 LAB — HEMOGLOBIN A1C: Hgb A1c MFr Bld: 7.5 % — ABNORMAL HIGH (ref 4.6–6.5)

## 2020-03-06 ENCOUNTER — Ambulatory Visit (INDEPENDENT_AMBULATORY_CARE_PROVIDER_SITE_OTHER): Payer: Medicare Other | Admitting: Internal Medicine

## 2020-03-06 ENCOUNTER — Other Ambulatory Visit: Payer: Self-pay

## 2020-03-06 ENCOUNTER — Encounter: Payer: Self-pay | Admitting: Internal Medicine

## 2020-03-06 VITALS — BP 128/76 | HR 81 | Temp 98.7°F | Resp 15 | Ht 68.0 in | Wt 198.2 lb

## 2020-03-06 DIAGNOSIS — R972 Elevated prostate specific antigen [PSA]: Secondary | ICD-10-CM | POA: Diagnosis not present

## 2020-03-06 DIAGNOSIS — R419 Unspecified symptoms and signs involving cognitive functions and awareness: Secondary | ICD-10-CM

## 2020-03-06 DIAGNOSIS — K760 Fatty (change of) liver, not elsewhere classified: Secondary | ICD-10-CM

## 2020-03-06 DIAGNOSIS — E119 Type 2 diabetes mellitus without complications: Secondary | ICD-10-CM

## 2020-03-06 DIAGNOSIS — E7800 Pure hypercholesterolemia, unspecified: Secondary | ICD-10-CM

## 2020-03-06 DIAGNOSIS — I10 Essential (primary) hypertension: Secondary | ICD-10-CM

## 2020-03-06 DIAGNOSIS — G5711 Meralgia paresthetica, right lower limb: Secondary | ICD-10-CM | POA: Diagnosis not present

## 2020-03-06 NOTE — Patient Instructions (Signed)
Your blood pressure is well controlled  The januvia and metformin combination has improved your glycemic control.  Continue both  The afternoon low blood sugars may be due to a light lunch.  Add more protein to it,  Or a starchy vegetable    Liver cysts are benign and do not need any treatment  Your right shoulder will have arthritis and a probable rotator cuff muscle tear from your dislocation . If the pain improves with activity,  It's mostly arthritis.  If it gets worse with activity, we can make an orthopedic referral   Return in 3 months

## 2020-03-06 NOTE — Assessment & Plan Note (Signed)
Referred to Urology in Jan 2021.  Seen by [facility] BPH diagnosed

## 2020-03-06 NOTE — Progress Notes (Signed)
Subjective:  Patient ID: Robert Robertson., male    DOB: 11-21-1950  Age: 69 y.o. MRN: [id number]  CC: The primary encounter diagnosis was Pure hypercholesterolemia. Diagnoses of PSA elevation, Hepatic steatosis, DM type 2, not at goal [facility]), Essential hypertension, Cognitive complaints with normal neuropsychological exam, and Meralgia paraesthetica, right were also pertinent to this visit.  HPI Robert Robertson. presents for follow up on diabetes and hypertension,  And new issues.   This visit occurred during the SARS-CoV-2 public health emergency.  Safety protocols were in place, including screening questions prior to the visit, additional usage of staff PPE, and extensive cleaning of exam room while observing appropriate contact time as indicated for disinfecting solutions.   Type 2 DM:  mid afternoon lows to 84 .  Takes metformin with breakfast along with Januvia.  Light lunch (salad,  Or sandhich)  a1c improved from 8 to 7.5   Right lateral elbow and right shoulder limited ROM   Remote history of shoulder dislocation  Never saw ortho. improves with movement.    Recurrent slight numbness of right anterior thigh . No weakness,  No back pain.   Patient is taking his medications as prescribed and notes no adverse effects.  Home BP readings have been done daily and are generally < 130/80 .  She is avoiding added salt in her diet and walking regularly about 3 times per week for exercise  .  Worried about Short term memory issues.Robert Robertson issues limited to episodes of forgetting what he was walking into a room for,  But eventually able to remember.     Outpatient Medications Prior to Visit  Medication Sig Dispense Refill  . amLODipine (NORVASC) 10 MG tablet Take 1 tablet (10 mg total) by mouth daily. 90 tablet 1  . aspirin 81 MG tablet Take 81 mg by mouth daily.    Robert Robertson atorvastatin (LIPITOR) 80 MG tablet TAKE 1 TABLET DAILY 90 tablet 3  . blood glucose meter kit and supplies USE TO CHECK  BLOOD SUGARS UP TO TWO TIMES DAILY 1 each 0  . Blood Glucose Monitoring Suppl (FREESTYLE FREEDOM LITE) w/Device KIT 1 application by Does not apply route 2 (two) times daily. 1 kit 0  . glucose blood (FREESTYLE LITE) test strip USE TO CHECK SUGARS TWICE A DAY 200 each 1  . hydrochlorothiazide (HYDRODIURIL) 25 MG tablet TAKE 1 TABLET DAILY 90 tablet 3  . JANUVIA 100 MG tablet TAKE 1 TABLET DAILY 90 tablet 3  . Krill Oil 300 MG CAPS Take 1 capsule by mouth daily.    . Lancets (FREESTYLE) lancets CHECK SUGAR TWICE A DAY 200 each 0  . metFORMIN (GLUCOPHAGE) 1000 MG tablet Take 1 tablet (1,000 mg total) by mouth 2 (two) times daily with a meal. 180 tablet 0  . Multiple Vitamins-Minerals (CENTRUM SILVER ADULT 50+ PO) Take 1 tablet by mouth daily.    Robert Robertson telmisartan (MICARDIS) 40 MG tablet TAKE 1 TABLET AT BEDTIME 90 tablet 3   No facility-administered medications prior to visit.    Review of Systems;  Patient denies headache, fevers, malaise, unintentional weight loss, skin rash, eye pain, sinus congestion and sinus pain, sore throat, dysphagia,  hemoptysis , cough, dyspnea, wheezing, chest pain, palpitations, orthopnea, edema, abdominal pain, nausea, melena, diarrhea, constipation, flank pain, dysuria, hematuria, urinary  Frequency, nocturia, numbness, tingling, seizures,  Focal weakness, Loss of consciousness,  Tremor, insomnia, depression, anxiety, and suicidal ideation.      Objective:  BP 128/76 (BP  Location: Left Arm, Patient Position: Sitting, Cuff Size: Normal)   Pulse 81   Temp 98.7 F (37.1 C) (Oral)   Resp 15   Ht _0  (1.727 m)   Wt 198 lb 3.2 oz (89.9 kg)   SpO2 99%   BMI 30.14 kg/m   BP Readings from Last 3 Encounters:  03/06/20 128/76  02/06/20 116/79  12/24/19 121/72    Wt Readings from Last 3 Encounters:  03/06/20 198 lb 3.2 oz (89.9 kg)  02/06/20 194 lb (88 kg)  12/17/19 194 lb 12.8 oz (88.4 kg)    General appearance: alert, cooperative and appears stated  age Ears: normal TM's and external ear canals both ears Throat: lips, mucosa, and tongue normal; teeth and gums normal Neck: no adenopathy, no carotid bruit, supple, symmetrical, trachea midline and thyroid not enlarged, symmetric, no tenderness/mass/nodules Back: symmetric, no curvature. ROM normal. No CVA tenderness. Lungs: clear to auscultation bilaterally Heart: regular rate and rhythm, S1, S2 normal, no murmur, click, rub or gallop Abdomen: soft, non-tender; bowel sounds normal; no masses,  no organomegaly Pulses: 2+ and symmetric Skin: Skin color, texture, turgor normal. No rashes or lesions Lymph nodes: Cervical, supraclavicular, and axillary nodes normal.  Lab Results  Component Value Date   HGBA1C 7.5 (H) 03/04/2020   HGBA1C 8.0 (H) 10/08/2019   HGBA1C 7.6 (H) 07/03/2019    Lab Results  Component Value Date   CREATININE 0.90 03/04/2020   CREATININE 0.87 10/08/2019   CREATININE 0.96 07/03/2019    Lab Results  Component Value Date   WBC 5.4 06/07/2018   HGB 14.2 06/07/2018   HCT 43.6 06/07/2018   PLT 170.0 06/07/2018   GLUCOSE 121 (H) 03/04/2020   CHOL 131 03/04/2020   TRIG 58.0 03/04/2020   HDL 51.40 03/04/2020   LDLDIRECT 59.0 08/31/2016   LDLCALC 68 03/04/2020   ALT 35 03/04/2020   AST 29 03/04/2020   NA 137 03/04/2020   K 4.0 03/04/2020   CL 101 03/04/2020   CREATININE 0.90 03/04/2020   BUN 17 03/04/2020   CO2 28 03/04/2020   PSA 2.48 07/03/2019   INR 1.1 (H) 10/09/2018   HGBA1C 7.5 (H) 03/04/2020   MICROALBUR <0.7 03/04/2020    US Abdomen Limited RUQ  Result Date: 12/26/2019 CLINICAL DATA:  Nonalcoholic steatohepatitis EXAM: ULTRASOUND ABDOMEN LIMITED RIGHT UPPER QUADRANT COMPARISON:  January 16, 2019 FINDINGS: Gallbladder: No gallstones or wall thickening visualized. There is no pericholecystic fluid. No sonographic Murphy sign noted by sonographer. Common bile duct: Diameter: 4 mm. No intrahepatic or extrahepatic biliary duct dilatation. Liver: There  is a cyst in the right lobe of the liver measuring 1.5 x 1.5 x 1.3 cm, also noted previously. Liver echogenicity is increased diffusely. Portal vein is patent on color Doppler imaging with normal direction of blood flow towards the liver. Other: None. IMPRESSION: Diffuse increase in liver echogenicity, a finding indicative of hepatic steatosis, similar to appearance on prior study. There is a stable cyst in the right lobe of the liver. No other focal liver lesion evident. Note that the sensitivity of ultrasound for detection of noncystic liver lesions is diminished significantly in this circumstance. Study otherwise unremarkable. Electronically Signed   By: Lowella Grip III M.D.   On: 12/26/2019 14:17    Assessment & Plan:   Problem List Items Addressed This Visit      Unprioritized   PSA elevation    Referred to Urology in Jan 2021.  Seen by [facility] BPH diagnosed  Meralgia paraesthetica, right    Suggested by new report or periodic right anterior thigh numbness No workup needed       Hyperlipidemia - Primary   Hepatic steatosis    June 2021 Ultrasound done by GI. Reviewed with patient, reassured that liver cysts are typically  benign       Relevant Orders   Comprehensive metabolic panel   Lipid panel   Essential hypertension    Well controlled on current regimen. Renal function stable, no changes today.  Lab Results  Component Value Date   CREATININE 0.90 03/04/2020   Lab Results  Component Value Date   NA 137 03/04/2020   K 4.0 03/04/2020   CL 101 03/04/2020   CO2 28 03/04/2020         DM type 2, not at goal [facility])    Improving control on current regimen of metformin and Januvia.  Afternoon lows addressed with dietary changes.       Relevant Orders   Hemoglobin A1c   Cognitive complaints with normal neuropsychological exam    Reassured that his current complaints do not suggest dearly dementia/         I provided  30 minutes of  face-to-face time during  this encounter reviewing patient's current problems and past surgeries, labs and imaging studies, providing counseling on the above mentioned problems , and coordination  of care .  I am having Lynnette Caffey. maintain his aspirin, Astrid Drafts, Multiple Vitamins-Minerals (CENTRUM SILVER ADULT 50+ PO), atorvastatin, telmisartan, Januvia, blood glucose meter kit and supplies, FreeStyle Freedom Lite, hydrochlorothiazide, metFORMIN, amLODipine, FREESTYLE LITE, and freestyle.  No orders of the defined types were placed in this encounter.   There are no discontinued medications.  Follow-up: Return in about 3 months (around 06/05/2020) for follow up diabetes.   [name], MD

## 2020-03-08 DIAGNOSIS — R419 Unspecified symptoms and signs involving cognitive functions and awareness: Secondary | ICD-10-CM | POA: Insufficient documentation

## 2020-03-08 DIAGNOSIS — G5711 Meralgia paresthetica, right lower limb: Secondary | ICD-10-CM | POA: Insufficient documentation

## 2020-03-08 NOTE — Assessment & Plan Note (Signed)
Suggested by new report or periodic right anterior thigh numbness No workup needed

## 2020-03-08 NOTE — Assessment & Plan Note (Signed)
June 2021 Ultrasound done by GI. Reviewed with patient, reassured that liver cysts are typically  benign

## 2020-03-08 NOTE — Assessment & Plan Note (Signed)
Well controlled on current regimen. Renal function stable, no changes today.  Lab Results  Component Value Date   CREATININE 0.90 03/04/2020   Lab Results  Component Value Date   NA 137 03/04/2020   K 4.0 03/04/2020   CL 101 03/04/2020   CO2 28 03/04/2020

## 2020-03-08 NOTE — Assessment & Plan Note (Signed)
Reassured that his current complaints do not suggest dearly dementia/

## 2020-03-08 NOTE — Assessment & Plan Note (Addendum)
Improving control on current regimen of metformin and Januvia.  Afternoon lows addressed with dietary changes.

## 2020-03-25 ENCOUNTER — Ambulatory Visit
Admission: EM | Admit: 2020-03-25 | Discharge: 2020-03-25 | Disposition: A | Discharge status: Home / Self Care | Payer: Medicare Other | Attending: Family Medicine | Admitting: Family Medicine

## 2020-03-25 ENCOUNTER — Other Ambulatory Visit: Payer: Self-pay

## 2020-03-25 DIAGNOSIS — R109 Unspecified abdominal pain: Secondary | ICD-10-CM

## 2020-03-25 DIAGNOSIS — T148XXA Other injury of unspecified body region, initial encounter: Secondary | ICD-10-CM | POA: Diagnosis not present

## 2020-03-25 DIAGNOSIS — Z23 Encounter for immunization: Secondary | ICD-10-CM | POA: Diagnosis not present

## 2020-03-25 LAB — POCT URINALYSIS DIP (MANUAL ENTRY)
Bilirubin, UA: NEGATIVE
Blood, UA: NEGATIVE
Glucose, UA: NEGATIVE mg/dL
Leukocytes, UA: NEGATIVE
Nitrite, UA: NEGATIVE
Protein Ur, POC: NEGATIVE mg/dL
Spec Grav, UA: 1.025 (ref 1.010–1.025)
Urobilinogen, UA: 1 E.U./dL
pH, UA: 6.5 (ref 5.0–8.0)

## 2020-03-25 MED ORDER — TIZANIDINE HCL 4 MG PO TABS: 4.0000 mg | ORAL_TABLET | Freq: Four times a day (QID) | ORAL | 0 refills | Status: DC | PRN

## 2020-03-25 NOTE — Discharge Instructions (Addendum)
I would have you take ibuprofen/tylenol for your pain  I have sent in a muscle relaxer for you to take twice a day as needed for spasms.  This medication can make you sleepy.  Do not drive a car or operate heavy machinery with this medication.  If this medication is making you too sleepy, you may break the pill in half and take half of it.  Your urine was negative for any infection today.  If this is not resolving or pain, follow-up with primary care or this office as needed  Follow-up with the ER for unrelenting pain, trouble swallowing, trouble breathing, other concerning symptoms

## 2020-03-25 NOTE — ED Triage Notes (Signed)
Pt reports left flank pain x 3 days.  Pain is intermittent and positional. Increases with weight bearing.  Does have DDD and pinched nerve L4-L5 and wonders if it is related.    No urinary symptoms - denies frequency, burning, odor, hematuria.

## 2020-03-25 NOTE — ED Provider Notes (Signed)
[place]   [id number] 03/25/20 Arrival Time: 1729  [id number] PAIN  SUBJECTIVE: History from: patient. Robert Robertson. is a 69 y.o. male complains of left side pain that began x4 days ago.  Reports that the pain is worse when he is changing positions getting up from sitting and standing.  Patient reports that he has degenerative disc disease and has a pinched nerve at L4 and 5 and wonder if this is related.  Denies a precipitating event or specific injury.  Describes the pain as intermittent and almost stabbing, but is worse with movement.  Has not taken OTC medications for this. Denies similar symptoms in the past.  Denies fever, chills, erythema, ecchymosis, effusion, weakness, numbness and tingling, saddle paresthesias, loss of bowel or bladder function.      ROS: As per HPI.  All other pertinent ROS negative.     Past Medical History:  Diagnosis Date  . Allergy   . Colon polyp   . Diabetes mellitus   . Hyperlipidemia   . Hypertension    Past Surgical History:  Procedure Laterality Date  . COLONOSCOPY  2010   10 mm adenomatous polyp in the descending colon without atypia, [name], MD  . COLONOSCOPY WITH PROPOFOL N/A 07/18/2019   Procedure: COLONOSCOPY WITH PROPOFOL;  Surgeon: Robert Bellow, MD;  Location: [facility] ENDOSCOPY;  Service: Endoscopy;  Laterality: N/A;   Allergies  Allergen Reactions  . Pollen Extract Other (See Comments)    Hay-fever. Reaction: Sneezing   No current facility-administered medications on file prior to encounter.   Current Outpatient Medications on File Prior to Encounter  Medication Sig Dispense Refill  . amLODipine (NORVASC) 10 MG tablet Take 1 tablet (10 mg total) by mouth daily. 90 tablet 1  . aspirin 81 MG tablet Take 81 mg by mouth daily.    [name] atorvastatin (LIPITOR) 80 MG tablet TAKE 1 TABLET DAILY 90 tablet 3  . blood glucose meter kit and supplies USE TO CHECK BLOOD SUGARS UP TO TWO TIMES DAILY 1 each 0  . Blood  Glucose Monitoring Suppl (FREESTYLE FREEDOM LITE) w/Device KIT 1 application by Does not apply route 2 (two) times daily. 1 kit 0  . glucose blood (FREESTYLE LITE) test strip USE TO CHECK SUGARS TWICE A DAY 200 each 1  . hydrochlorothiazide (HYDRODIURIL) 25 MG tablet TAKE 1 TABLET DAILY 90 tablet 3  . JANUVIA 100 MG tablet TAKE 1 TABLET DAILY 90 tablet 3  . Krill Oil 300 MG CAPS Take 1 capsule by mouth daily.    . Lancets (FREESTYLE) lancets CHECK SUGAR TWICE A DAY 200 each 0  . metFORMIN (GLUCOPHAGE) 1000 MG tablet Take 1 tablet (1,000 mg total) by mouth 2 (two) times daily with a meal. 180 tablet 0  . Multiple Vitamins-Minerals (CENTRUM SILVER ADULT 50+ PO) Take 1 tablet by mouth daily.    [name] telmisartan (MICARDIS) 40 MG tablet TAKE 1 TABLET AT BEDTIME 90 tablet 3   Social History   Socioeconomic History  . Marital status: Married    Spouse name: Not on file  . Number of children: Not on file  . Years of education: Not on file  . Highest education level: Not on file  Occupational History  . Not on file  Tobacco Use  . Smoking status: Former Smoker    Packs/day: 1.00    Years: 30.00    Pack years: 30.00    Types: Cigarettes  . Smokeless tobacco: Never Used  Vaping Use  .  Vaping Use: Never used  Substance and Sexual Activity  . Alcohol use: Yes    Alcohol/week: 14.0 standard drinks    Types: 14 Glasses of wine per week    Comment: daily  . Drug use: No  . Sexual activity: Not on file  Other Topics Concern  . Not on file  Social History Narrative  . Not on file   Social Determinants of Health   Financial Resource Strain: Low Risk   . Difficulty of Paying Living Expenses: Not hard at all  Food Insecurity: No Food Insecurity  . Worried About Charity fundraiser in the Last Year: Never true  . Ran Out of Food in the Last Year: Never true  Transportation Needs: No Transportation Needs  . Lack of Transportation (Medical): No  . Lack of Transportation (Non-Medical): No    Physical Activity:   . Days of Exercise per Week: Not on file  . Minutes of Exercise per Session: Not on file  Stress: No Stress Concern Present  . Feeling of Stress : Not at all  Social Connections:   . Frequency of Communication with Friends and Family: Not on file  . Frequency of Social Gatherings with Friends and Family: Not on file  . Attends Religious Services: Not on file  . Active Member of Clubs or Organizations: Not on file  . Attends Archivist Meetings: Not on file  . Marital Status: Not on file  Intimate Partner Violence: Not At Risk  . Fear of Current or Ex-Partner: No  . Emotionally Abused: No  . Physically Abused: No  . Sexually Abused: No   Family History  Problem Relation Age of Onset  . Diabetes Mother   . Stroke Mother   . Colon cancer Mother   . Diabetes Brother   . Diabetes Maternal Grandfather   . Diabetes Maternal Grandmother   . Cancer Sister        brain tumor    OBJECTIVE:  Vitals:   03/25/20 1756  BP: 131/89  Pulse: 94  Resp: 16  Temp: 99.6 F (37.6 C)  SpO2: 96%    General appearance: ALERT; in no acute distress.  Head: NCAT Lungs: Normal respiratory effort CV: pulses 2+ bilaterally. Cap refill < 2 seconds Musculoskeletal:  Inspection: Skin warm, dry, clear and intact without obvious erythema, effusion, or ecchymosis.  Palpation: Left low abdomen tender to palpation ROM: Limited ROM active and passive with bending and twisting Skin: warm and dry Neurologic: Ambulates without difficulty; Sensation intact about the upper/ lower extremities Psychological: alert and cooperative; normal mood and affect  DIAGNOSTIC STUDIES:  No results found.   ASSESSMENT & PLAN:  1. Left sided abdominal pain   2. Muscle strain     Meds ordered this encounter  Medications  . tiZANidine (ZANAFLEX) 4 MG tablet    Sig: Take 1 tablet (4 mg total) by mouth every 6 (six) hours as needed for muscle spasms.    Dispense:  30 tablet     Refill:  0    Order Specific Question:   Supervising Provider    Answer:   Chase Picket [[id number]]   Prescribed tizanidine UA negative for infection today We will treat as muscle strain Continue conservative management of rest, ice, and gentle stretches Take ibuprofen as needed for pain relief (may cause abdominal discomfort, ulcers, and GI bleeds avoid taking with other NSAIDs) May also use Tylenol as needed for pain Take tizanidine at nighttime for symptomatic relief. Avoid  driving or operating heavy machinery while using medication. Follow up with PCP if symptoms persist Return or go to the ER if you have any new or worsening symptoms (fever, chills, chest pain, abdominal pain, changes in bowel or bladder habits, pain radiating into lower legs)   Reviewed expectations re: course of current medical issues. Questions answered. Outlined signs and symptoms indicating need for more acute intervention. Patient verbalized understanding. After Visit Summary given.       Faustino Congress, NP 03/25/20 1845

## 2020-04-18 ENCOUNTER — Other Ambulatory Visit: Payer: Self-pay | Admitting: Internal Medicine

## 2020-05-04 ENCOUNTER — Other Ambulatory Visit: Payer: Self-pay | Admitting: Internal Medicine

## 2020-06-04 ENCOUNTER — Other Ambulatory Visit: Payer: Self-pay

## 2020-06-04 ENCOUNTER — Other Ambulatory Visit (INDEPENDENT_AMBULATORY_CARE_PROVIDER_SITE_OTHER): Payer: Medicare Other

## 2020-06-04 DIAGNOSIS — E119 Type 2 diabetes mellitus without complications: Secondary | ICD-10-CM

## 2020-06-04 DIAGNOSIS — K760 Fatty (change of) liver, not elsewhere classified: Secondary | ICD-10-CM | POA: Diagnosis not present

## 2020-06-04 DIAGNOSIS — H40003 Preglaucoma, unspecified, bilateral: Secondary | ICD-10-CM | POA: Diagnosis not present

## 2020-06-04 LAB — COMPREHENSIVE METABOLIC PANEL
ALT: 37 U/L (ref 0–53)
AST: 28 U/L (ref 0–37)
Albumin: 4.4 g/dL (ref 3.5–5.2)
Alkaline Phosphatase: 68 U/L (ref 39–117)
BUN: 11 mg/dL (ref 6–23)
CO2: 31 mEq/L (ref 19–32)
Calcium: 9.4 mg/dL (ref 8.4–10.5)
Chloride: 100 mEq/L (ref 96–112)
Creatinine, Ser: 0.92 mg/dL (ref 0.40–1.50)
GFR: 84.85 mL/min (ref >60.00)
Glucose, Bld: 118 mg/dL — ABNORMAL HIGH (ref 70–99)
Potassium: 3.9 mEq/L (ref 3.5–5.1)
Sodium: 137 mEq/L (ref 135–145)
Total Bilirubin: 0.9 mg/dL (ref 0.2–1.2)
Total Protein: 6.9 g/dL (ref 6.0–8.3)

## 2020-06-04 LAB — LIPID PANEL
Cholesterol: 125 mg/dL (ref 0–200)
HDL: 45.5 mg/dL (ref >39.00)
LDL Cholesterol: 67 mg/dL (ref 0–99)
NonHDL: 79.89
Total CHOL/HDL Ratio: 3
Triglycerides: 62 mg/dL (ref 0.0–149.0)
VLDL: 12.4 mg/dL (ref 0.0–40.0)

## 2020-06-04 LAB — HEMOGLOBIN A1C: Hgb A1c MFr Bld: 7.4 % — ABNORMAL HIGH (ref 4.6–6.5)

## 2020-06-05 ENCOUNTER — Encounter: Payer: Self-pay | Admitting: Internal Medicine

## 2020-06-06 ENCOUNTER — Other Ambulatory Visit: Payer: Self-pay

## 2020-06-06 ENCOUNTER — Encounter: Payer: Self-pay | Admitting: Internal Medicine

## 2020-06-06 ENCOUNTER — Ambulatory Visit (INDEPENDENT_AMBULATORY_CARE_PROVIDER_SITE_OTHER): Payer: Medicare Other | Admitting: Internal Medicine

## 2020-06-06 VITALS — BP 122/80 | HR 85 | Resp 14 | Ht 67.0 in | Wt 193.8 lb

## 2020-06-06 DIAGNOSIS — Z125 Encounter for screening for malignant neoplasm of prostate: Secondary | ICD-10-CM

## 2020-06-06 DIAGNOSIS — Z87891 Personal history of nicotine dependence: Secondary | ICD-10-CM

## 2020-06-06 DIAGNOSIS — K760 Fatty (change of) liver, not elsewhere classified: Secondary | ICD-10-CM | POA: Diagnosis not present

## 2020-06-06 DIAGNOSIS — E1121 Type 2 diabetes mellitus with diabetic nephropathy: Secondary | ICD-10-CM

## 2020-06-06 DIAGNOSIS — E119 Type 2 diabetes mellitus without complications: Secondary | ICD-10-CM

## 2020-06-06 DIAGNOSIS — Z122 Encounter for screening for malignant neoplasm of respiratory organs: Secondary | ICD-10-CM | POA: Diagnosis not present

## 2020-06-06 DIAGNOSIS — I10 Essential (primary) hypertension: Secondary | ICD-10-CM | POA: Diagnosis not present

## 2020-06-06 NOTE — Patient Instructions (Signed)
There is no screening test for pancreatic cancer.  There IS a screening program for lung CA and I will make the referral  Giving up alcohol will certainly help prevent progression of fatty liver to cirrhosis , so you are doing the right thing!   See you in 3 months

## 2020-06-06 NOTE — Progress Notes (Signed)
Subjective:  Patient ID: Robert Lat., male    DOB: 1950-12-07  Age: 69 y.o. MRN: [id number]  CC: The primary encounter diagnosis was History of tobacco abuse. Diagnoses of Screening for lung cancer, Hepatic steatosis, DM type 2, not at goal [facility]), Prostate cancer screening, Essential hypertension, and Diabetic nephropathy associated with type 2 diabetes mellitus ([place]) were also pertinent to this visit.  HPI Robert Robertson. presents for diabetes followup  This visit occurred during the SARS-CoV-2 public health emergency.  Safety protocols were in place, including screening questions prior to the visit, additional usage of staff PPE, and extensive cleaning of exam room while observing appropriate contact time as indicated for disinfecting solutions.   He feels generally well, is exercising several times per week and checking blood sugars once daily at variable times.  BS have been under 130 fasting and < 160 post prandially.  Denies any recent hypoglyemic events.  Taking his medications as directed. Following a carbohydrate modified diet 6 days per week. Denies numbness, burning and tingling of extremities. Appetite is good.    Right shoulder pain .  Has been working it out at the gym   Onychomycosis  Of toe nails. Working on them using tea tree oil.    Fatty liver clinic :  He had a recent fibroscan which showed minimal fibrosis.  He has given up alcohol   He is requesting lung cancer screening .   X smoker.  started at age 83 ,  Average use 1/2 pack daily ,  Quit around the age of 57  Outpatient Medications Prior to Visit  Medication Sig Dispense Refill  . amLODipine (NORVASC) 10 MG tablet Take 1 tablet (10 mg total) by mouth daily. 90 tablet 1  . aspirin 81 MG tablet Take 81 mg by mouth daily.    [name] atorvastatin (LIPITOR) 80 MG tablet TAKE 1 TABLET DAILY 90 tablet 3  . blood glucose meter kit and supplies USE TO CHECK BLOOD SUGARS UP TO TWO TIMES DAILY 1 each 0  . Blood  Glucose Monitoring Suppl (FREESTYLE FREEDOM LITE) w/Device KIT 1 application by Does not apply route 2 (two) times daily. 1 kit 0  . glucose blood (FREESTYLE LITE) test strip USE TO CHECK SUGARS TWICE A DAY 200 each 1  . hydrochlorothiazide (HYDRODIURIL) 25 MG tablet TAKE 1 TABLET DAILY 90 tablet 3  . JANUVIA 100 MG tablet TAKE 1 TABLET DAILY 90 tablet 3  . Krill Oil 300 MG CAPS Take 1 capsule by mouth daily.    . Lancets (FREESTYLE) lancets CHECK SUGAR TWICE A DAY 200 each 0  . metFORMIN (GLUCOPHAGE) 1000 MG tablet TAKE 1 TABLET TWICE A DAY WITH MEALS 180 tablet 3  . Multiple Vitamins-Minerals (CENTRUM SILVER ADULT 50+ PO) Take 1 tablet by mouth daily.    [name] telmisartan (MICARDIS) 40 MG tablet TAKE 1 TABLET AT BEDTIME 90 tablet 3  . tiZANidine (ZANAFLEX) 4 MG tablet Take 1 tablet (4 mg total) by mouth every 6 (six) hours as needed for muscle spasms. 30 tablet 0   No facility-administered medications prior to visit.    Review of Systems;  Patient denies headache, fevers, malaise, unintentional weight loss, skin rash, eye pain, sinus congestion and sinus pain, sore throat, dysphagia,  hemoptysis , cough, dyspnea, wheezing, chest pain, palpitations, orthopnea, edema, abdominal pain, nausea, melena, diarrhea, constipation, flank pain, dysuria, hematuria, urinary  Frequency, nocturia, numbness, tingling, seizures,  Focal weakness, Loss of consciousness,  Tremor, insomnia, depression,  anxiety, and suicidal ideation.      Objective:  BP 122/80 (BP Location: Left Arm, Patient Position: Sitting, Cuff Size: Normal)   Pulse 85   Resp 14   Ht 5' 7"  (1.702 m)   Wt 193 lb 12.8 oz (87.9 kg)   SpO2 97%   BMI 30.35 kg/m   BP Readings from Last 3 Encounters:  06/06/20 122/80  03/25/20 131/89  03/06/20 128/76    Wt Readings from Last 3 Encounters:  06/06/20 193 lb 12.8 oz (87.9 kg)  03/06/20 198 lb 3.2 oz (89.9 kg)  02/06/20 194 lb (88 kg)    General appearance: alert, cooperative and appears  stated age Ears: normal TM's and external ear canals both ears Throat: lips, mucosa, and tongue normal; teeth and gums normal Neck: no adenopathy, no carotid bruit, supple, symmetrical, trachea midline and thyroid not enlarged, symmetric, no tenderness/mass/nodules Back: symmetric, no curvature. ROM normal. No CVA tenderness. Lungs: clear to auscultation bilaterally Heart: regular rate and rhythm, S1, S2 normal, no murmur, click, rub or gallop Abdomen: soft, non-tender; bowel sounds normal; no masses,  no organomegaly Pulses: 2+ and symmetric Skin: Skin color, texture, turgor normal. No rashes or lesions Lymph nodes: Cervical, supraclavicular, and axillary nodes normal. Ext:  Bilateral onychomycosis and dystrophy  Lab Results  Component Value Date   HGBA1C 7.4 (H) 06/04/2020   HGBA1C 7.5 (H) 03/04/2020   HGBA1C 8.0 (H) 10/08/2019    Lab Results  Component Value Date   CREATININE 0.92 06/04/2020   CREATININE 0.90 03/04/2020   CREATININE 0.87 10/08/2019    Lab Results  Component Value Date   WBC 5.4 06/07/2018   HGB 14.2 06/07/2018   HCT 43.6 06/07/2018   PLT 170.0 06/07/2018   GLUCOSE 118 (H) 06/04/2020   CHOL 125 06/04/2020   TRIG 62.0 06/04/2020   HDL 45.50 06/04/2020   LDLDIRECT 59.0 08/31/2016   LDLCALC 67 06/04/2020   ALT 37 06/04/2020   AST 28 06/04/2020   NA 137 06/04/2020   K 3.9 06/04/2020   CL 100 06/04/2020   CREATININE 0.92 06/04/2020   BUN 11 06/04/2020   CO2 31 06/04/2020   PSA 2.48 07/03/2019   INR 1.1 (H) 10/09/2018   HGBA1C 7.4 (H) 06/04/2020   MICROALBUR <0.7 03/04/2020    No results found.  Assessment & Plan:   Problem List Items Addressed This Visit      Unprioritized   Essential hypertension    Well controlled on current regimen of  Telmisartan,  hctz and amlodipine  Renal function is  stable, no changes today.  Lab Results  Component Value Date   CREATININE 0.92 06/04/2020   Lab Results  Component Value Date   NA 137 06/04/2020    K 3.9 06/04/2020   CL 100 06/04/2020   CO2 31 06/04/2020         DM type 2, not at goal [facility])    Improving control on current regimen of metformin and Januvia.    Lab Results  Component Value Date   HGBA1C 7.4 (H) 06/04/2020         Relevant Orders   Comprehensive metabolic panel   Hemoglobin A1c   Lipid panel   History of tobacco abuse - Primary   Relevant Orders   Ambulatory Referral for Lung Cancer Scre   Hepatic steatosis    He has early fibrosis by recent Fibroscan .  He has decided to abstain from alcohol.       Diabetic nephropathy associated  with type 2 diabetes mellitus ([place])    Managed with telmisartan .  Lab Results  Component Value Date   MICROALBUR <0.7 03/04/2020         Prostate cancer screening   Relevant Orders   PSA, Medicare    Other Visit Diagnoses    Screening for lung cancer       Relevant Orders   Ambulatory Referral for Lung Cancer Scre      I have discontinued Robert Raring Jr.'s tiZANidine. I am also having him maintain his aspirin, Krill Oil, Multiple Vitamins-Minerals (CENTRUM SILVER ADULT 50+ PO), telmisartan, Januvia, blood glucose meter kit and supplies, FreeStyle Freedom Lite, hydrochlorothiazide, amLODipine, FREESTYLE LITE, freestyle, metFORMIN, and atorvastatin.  No orders of the defined types were placed in this encounter.   Medications Discontinued During This Encounter  Medication Reason  . tiZANidine (ZANAFLEX) 4 MG tablet     Follow-up: Return in about 3 months (around 09/04/2020) for follow up diabetes.   [name], MD

## 2020-06-08 NOTE — Assessment & Plan Note (Signed)
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  He has decided to abstain from alcohol.

## 2020-06-08 NOTE — Assessment & Plan Note (Signed)
Improving control on current regimen of metformin and Januvia.    Lab Results  Component Value Date   HGBA1C 7.4 (H) 06/04/2020

## 2020-06-08 NOTE — Assessment & Plan Note (Signed)
Well controlled on current regimen of  Telmisartan,  hctz and amlodipine  Renal function is  stable, no changes today.  Lab Results  Component Value Date   CREATININE 0.92 06/04/2020   Lab Results  Component Value Date   NA 137 06/04/2020   K 3.9 06/04/2020   CL 100 06/04/2020   CO2 31 06/04/2020

## 2020-06-08 NOTE — Assessment & Plan Note (Signed)
Managed with telmisartan .  Lab Results  Component Value Date   MICROALBUR <0.7 03/04/2020

## 2020-06-13 ENCOUNTER — Telehealth: Payer: Self-pay | Admitting: *Deleted

## 2020-06-13 DIAGNOSIS — Z87891 Personal history of nicotine dependence: Secondary | ICD-10-CM

## 2020-06-13 DIAGNOSIS — Z122 Encounter for screening for malignant neoplasm of respiratory organs: Secondary | ICD-10-CM

## 2020-06-13 NOTE — Telephone Encounter (Signed)
Received referral for initial lung cancer screening scan. Contacted patient and obtained smoking history,(former, quit 2015, 22.5 pack year history) as well as answering questions related to screening process. Patient denies signs of lung cancer such as weight loss or hemoptysis. Patient denies comorbidity that would prevent curative treatment if lung cancer were found. Patient is scheduled for shared decision making visit and CT scan on 07/09/20 at 1015am.

## 2020-06-29 ENCOUNTER — Other Ambulatory Visit: Payer: Self-pay | Admitting: Internal Medicine

## 2020-07-04 ENCOUNTER — Encounter: Payer: Self-pay | Admitting: *Deleted

## 2020-07-09 ENCOUNTER — Inpatient Hospital Stay: Payer: Medicare Other | Attending: Oncology | Admitting: Oncology

## 2020-07-09 ENCOUNTER — Ambulatory Visit
Admission: RE | Admit: 2020-07-09 | Discharge: 2020-07-09 | Disposition: A | Discharge status: Home / Self Care | Payer: Medicare Other | Source: Ambulatory Visit | Attending: Oncology | Admitting: Oncology

## 2020-07-09 ENCOUNTER — Other Ambulatory Visit: Payer: Self-pay

## 2020-07-09 DIAGNOSIS — Z87891 Personal history of nicotine dependence: Secondary | ICD-10-CM

## 2020-07-09 DIAGNOSIS — Z122 Encounter for screening for malignant neoplasm of respiratory organs: Secondary | ICD-10-CM | POA: Insufficient documentation

## 2020-07-09 IMAGING — CT CT CHEST LUNG CANCER SCREENING LOW DOSE W/O CM
2 of 5 series · 15 of 40 positions shown, 18 images · non-contrast
Comparison: None.

CLINICAL DATA: 69-year-old asymptomatic male former smoker with
22.5 pack-year smoking history, quit smoking 7 years prior.

EXAM:
CT CHEST WITHOUT CONTRAST LOW-DOSE FOR LUNG CANCER SCREENING
TECHNIQUE: Multidetector CT imaging of the chest was performed following the
standard protocol without IV contrast.

[Series 3: lung 1.00 · axial · 0.80mm/px · z∈[-1206,-887]mm · 12 of 353 slices shown, 15 images]
[im 17/353  mediastinal]
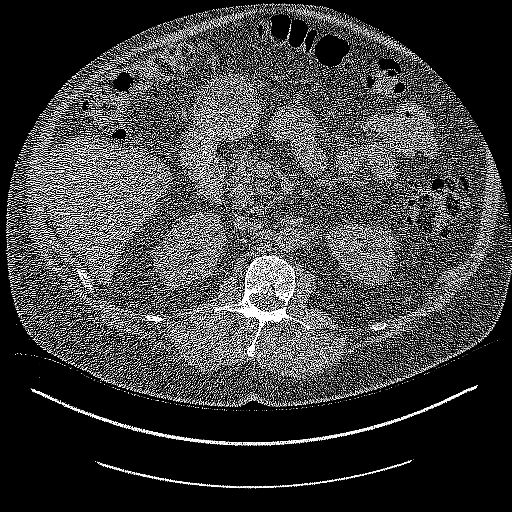
[im 17/353  lung]
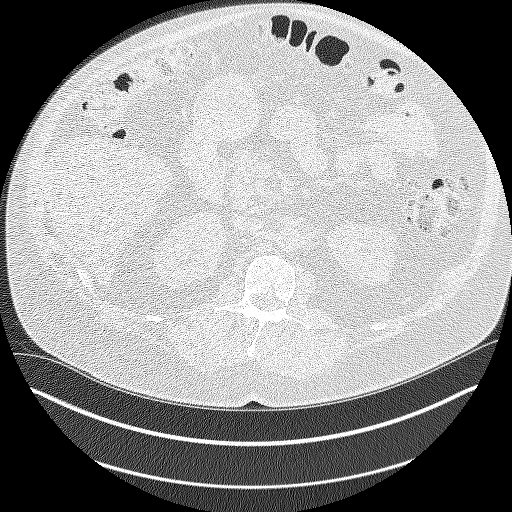
[im 51/353  lung]
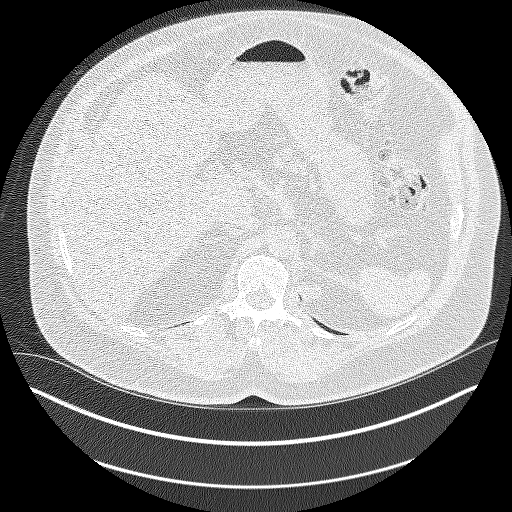
[im 84/353  lung]
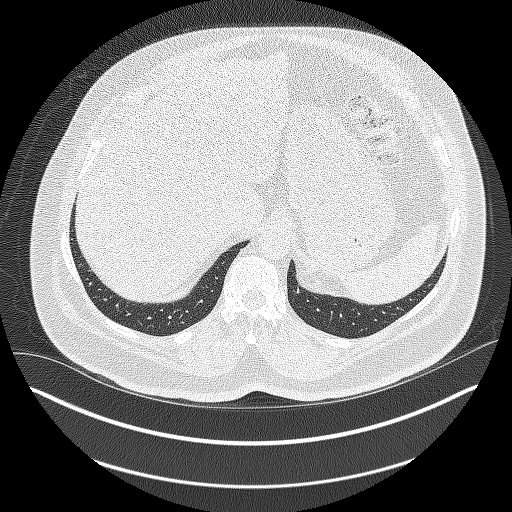
[im 101/353  lung]
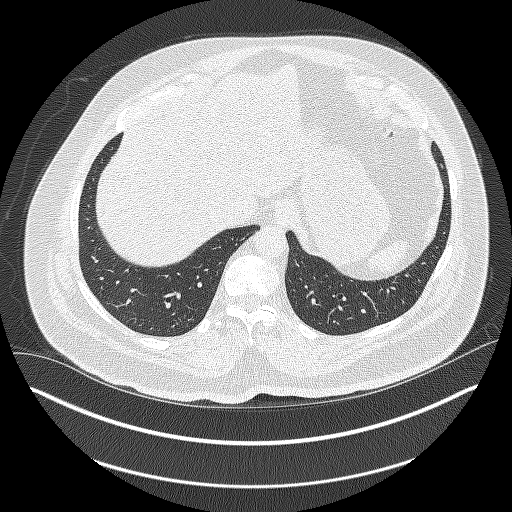
[im 135/353  mediastinal]
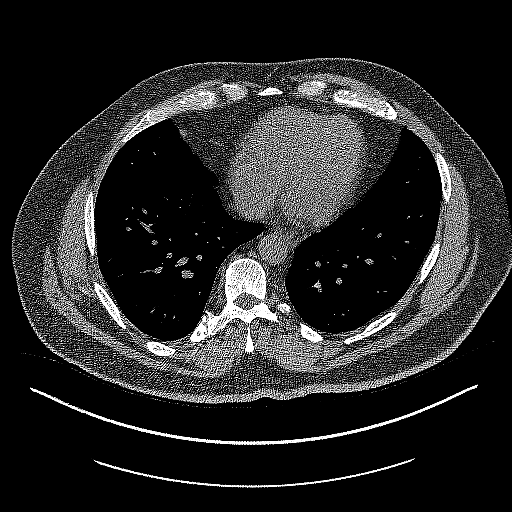
[im 135/353  lung]
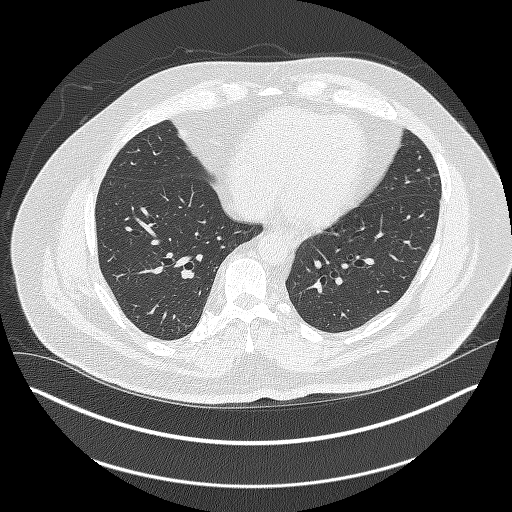
[im 168/353  lung]
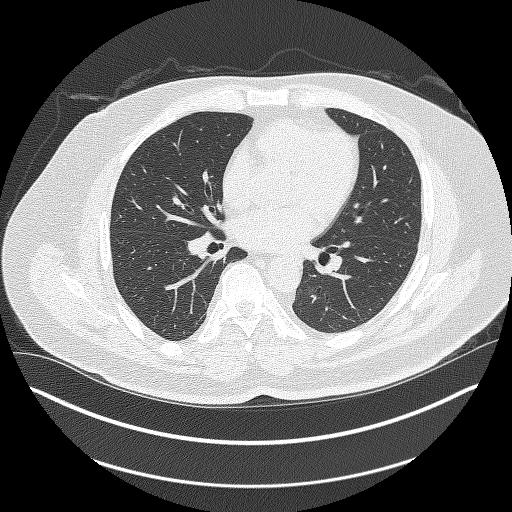
[im 185/353  lung]
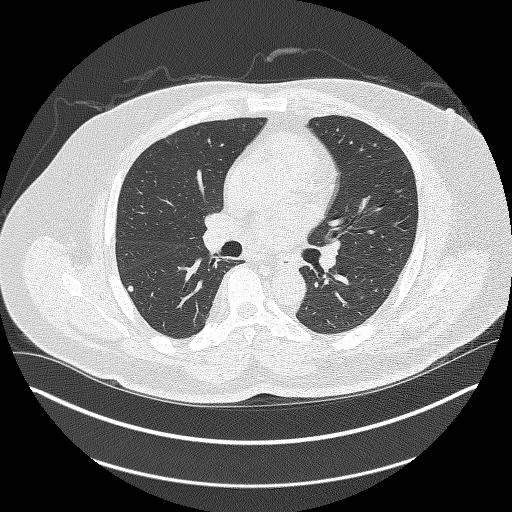
[im 218/353  lung]
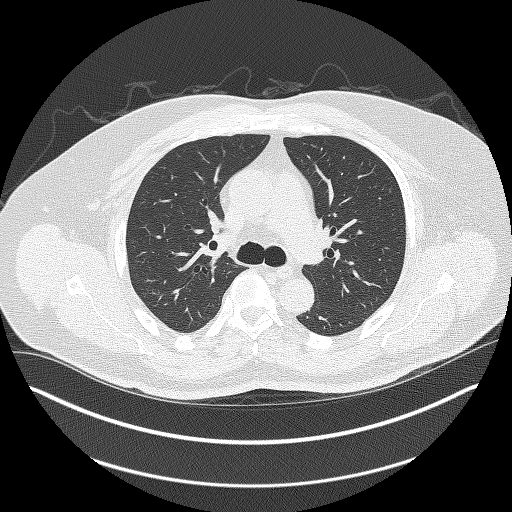
[im 252/353  mediastinal]
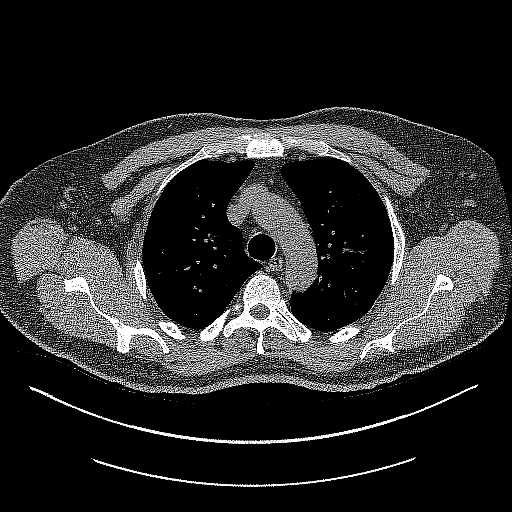
[im 252/353  lung]
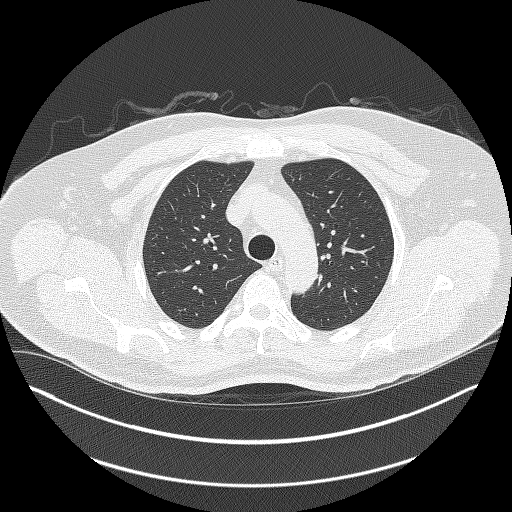
[im 269/353  lung]
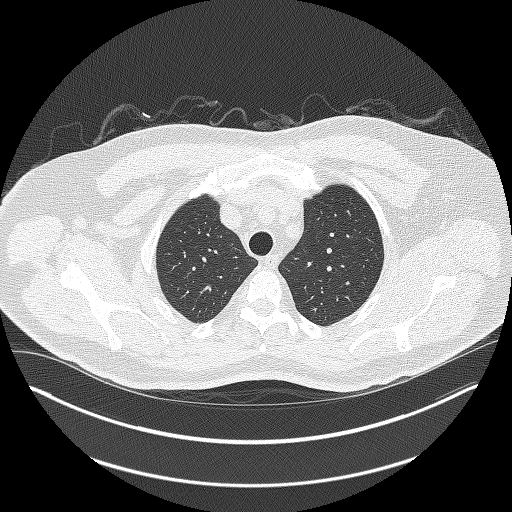
[im 302/353  lung]
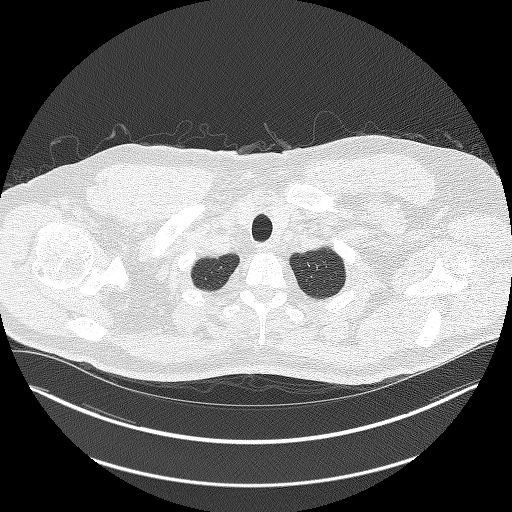
[im 336/353  lung]
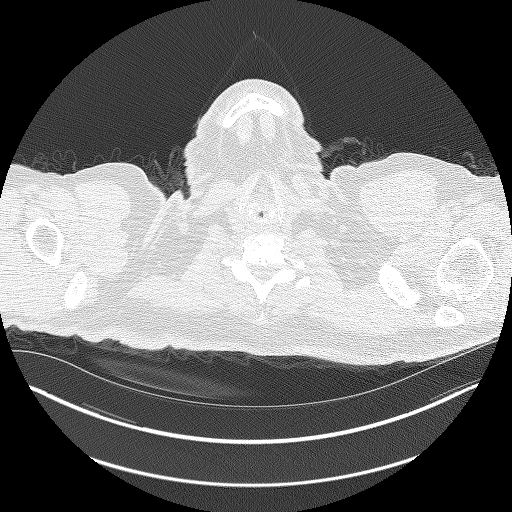

[Series 4: coronals lung 1.00 cor · coronal · 0.69mm/px · 3 of 379 slices shown]
[im 76/379  lung]
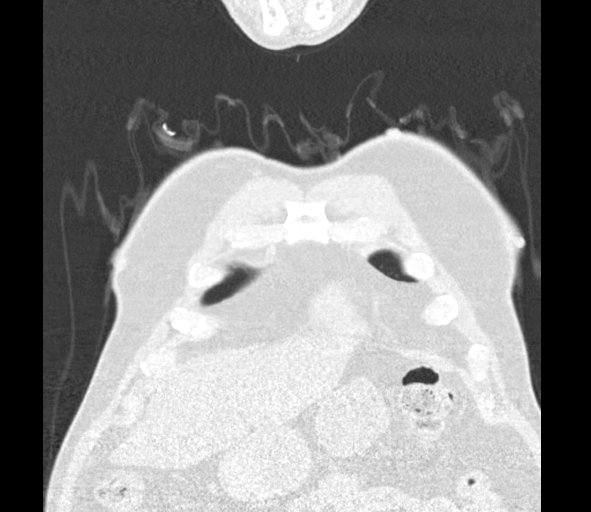
[im 152/379  lung]
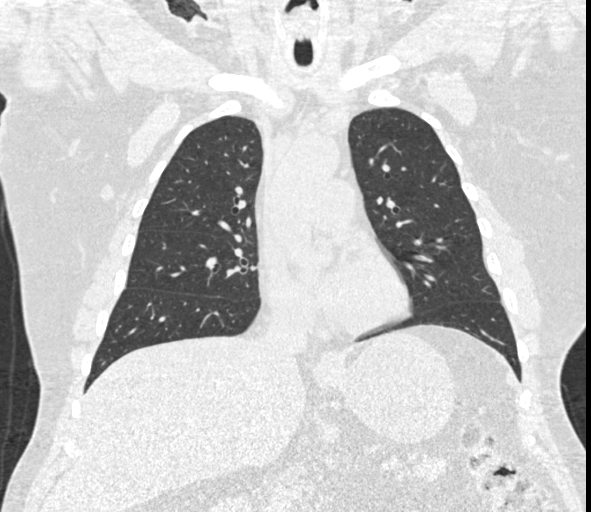
[im 227/379  lung]
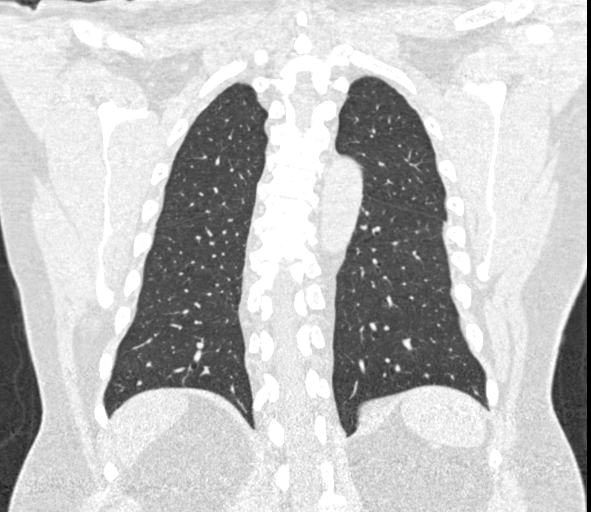

[15 of 40 positions shown; findings below may reference images not displayed]

FINDINGS: Cardiovascular: Normal heart size. No significant pericardial
effusion/thickening. Great vessels are normal in course and caliber.

Mediastinum/Nodes: No discrete thyroid nodules. Unremarkable
esophagus. No pathologically enlarged axillary, mediastinal or hilar
lymph nodes, noting limited sensitivity for the detection of hilar
adenopathy on this noncontrast study.

Lungs/Pleura: No pneumothorax. No pleural effusion. No acute
consolidative airspace disease or lung masses. Right lower lobe
solid pulmonary nodule measuring 4.7 mm in volume derived mean
diameter (series 3/image 169). No additional significant pulmonary
nodules.

Upper abdomen: Mild diffuse hepatic steatosis. Simple 1.4 cm
inferior right liver cyst.

Musculoskeletal: No aggressive appearing focal osseous lesions.
Degenerative changes in the visualized cervical spine.
IMPRESSION: 1. Lung-RADS 2, benign appearance or behavior. Continue annual
screening with low-dose chest CT without contrast in 12 months.
2. Mild diffuse hepatic steatosis.

## 2020-07-09 NOTE — Progress Notes (Signed)
Virtual Visit via Video Note  I connected with [name] on 07/09/20 at 10:15 AM EST by a video enabled telemedicine application and verified that I am speaking with the correct person using two identifiers.  Location: Patient: [name]  Provider: Clinic    I discussed the limitations of evaluation and management by telemedicine and the availability of in person appointments. The patient expressed understanding and agreed to proceed.  I discussed the assessment and treatment plan with the patient. The patient was provided an opportunity to ask questions and all were answered. The patient agreed with the plan and demonstrated an understanding of the instructions.   The patient was advised to call back or seek an in-person evaluation if the symptoms worsen or if the condition fails to improve as anticipated.   In accordance with CMS guidelines, patient has met eligibility criteria including age, absence of signs or symptoms of lung cancer.  Social History   Tobacco Use  . Smoking status: Former Smoker    Packs/day: 0.50    Years: 45.00    Pack years: 22.50    Types: Cigarettes    Quit date: 2015    Years since quitting: 7.0  . Smokeless tobacco: Never Used  Vaping Use  . Vaping Use: Never used  Substance Use Topics  . Alcohol use: Yes    Alcohol/week: 14.0 standard drinks    Types: 14 Glasses of wine per week    Comment: daily  . Drug use: No      A shared decision-making session was conducted prior to the performance of CT scan. This includes one or more decision aids, includes benefits and harms of screening, follow-up diagnostic testing, over-diagnosis, false positive rate, and total radiation exposure.   Counseling on the importance of adherence to annual lung cancer LDCT screening, impact of co-morbidities, and ability or willingness to undergo diagnosis and treatment is imperative for compliance of the program.   Counseling on the importance of continued smoking cessation  for former smokers; the importance of smoking cessation for current smokers, and information about tobacco cessation interventions have been given to patient including [place] and 1800 quit  programs.   Written order for lung cancer screening with LDCT has been given to the patient and any and all questions have been answered to the best of my abilities.    Yearly follow up will be coordinated by Burgess Estelle, Thoracic Navigator.  I provided 15 minutes of face-to-face video visit time during this encounter, and > 50% was spent counseling as documented under my assessment & plan.   Jacquelin Hawking, NP

## 2020-07-16 ENCOUNTER — Encounter: Payer: Self-pay | Admitting: *Deleted

## 2020-08-04 ENCOUNTER — Ambulatory Visit (INDEPENDENT_AMBULATORY_CARE_PROVIDER_SITE_OTHER): Payer: Medicare Other | Admitting: Urology

## 2020-08-04 ENCOUNTER — Encounter: Payer: Self-pay | Admitting: Urology

## 2020-08-04 ENCOUNTER — Other Ambulatory Visit: Payer: Self-pay

## 2020-08-04 DIAGNOSIS — N400 Enlarged prostate without lower urinary tract symptoms: Secondary | ICD-10-CM | POA: Diagnosis not present

## 2020-08-04 LAB — BLADDER SCAN AMB NON-IMAGING: Scan Result: 2

## 2020-08-04 NOTE — Progress Notes (Signed)
08/04/2020 11:25 AM   Jenny Reichmann Barkley Bruns. April 01, 1951 [id number]  Referring provider: [name], MD [place],  [place] [address]  Chief Complaint  Patient presents with  . Benign Prostatic Hypertrophy    HPI: 70 y.o. who presents for follow-up PSA/DRE.   Initially seen 08/06/2019 for change in PSA from 2.0 in 2015 to 2.48 and 2021  DRE was benign  PSA change not felt to be significant and a 1 year follow-up was recommended  PSA has not been checked this year.  No bothersome LUTS  PMH: Past Medical History:  Diagnosis Date  . Allergy   . Colon polyp   . Diabetes mellitus   . Hyperlipidemia   . Hypertension     Surgical History: Past Surgical History:  Procedure Laterality Date  . COLONOSCOPY  2010   10 mm adenomatous polyp in the descending colon without atypia, [name], MD  . COLONOSCOPY WITH PROPOFOL N/A 07/18/2019   Procedure: COLONOSCOPY WITH PROPOFOL;  Surgeon: Robert Bellow, MD;  Location: [facility] ENDOSCOPY;  Service: Endoscopy;  Laterality: N/A;    Home Medications:  Allergies as of 08/04/2020      Reactions   Pollen Extract Other (See Comments)   Hay-fever. Reaction: Sneezing      Medication List       Accurate as of August 04, 2020 11:25 AM. If you have any questions, ask your nurse or doctor.        amLODipine 10 MG tablet Commonly known as: NORVASC Take 1 tablet (10 mg total) by mouth daily.   aspirin 81 MG tablet Take 81 mg by mouth daily.   atorvastatin 80 MG tablet Commonly known as: LIPITOR TAKE 1 TABLET DAILY   blood glucose meter kit and supplies USE TO CHECK BLOOD SUGARS UP TO TWO TIMES DAILY   CENTRUM SILVER ADULT 50+ PO Take 1 tablet by mouth daily.   FreeStyle Freedom Lite w/Device Kit 1 application by Does not apply route 2 (two) times daily.   freestyle lancets CHECK SUGAR TWICE A DAY   FREESTYLE LITE test strip Generic drug: glucose blood USE TO CHECK SUGARS TWICE A DAY    hydrochlorothiazide 25 MG tablet Commonly known as: HYDRODIURIL TAKE 1 TABLET DAILY   Januvia 100 MG tablet Generic drug: sitaGLIPtin TAKE 1 TABLET DAILY   Krill Oil 300 MG Caps Take 1 capsule by mouth daily.   metFORMIN 1000 MG tablet Commonly known as: GLUCOPHAGE TAKE 1 TABLET TWICE A DAY WITH MEALS   telmisartan 40 MG tablet Commonly known as: MICARDIS TAKE 1 TABLET AT BEDTIME       Allergies:  Allergies  Allergen Reactions  . Pollen Extract Other (See Comments)    Hay-fever. Reaction: Sneezing    Family History: Family History  Problem Relation Age of Onset  . Diabetes Mother   . Stroke Mother   . Colon cancer Mother   . Diabetes Brother   . Diabetes Maternal Grandfather   . Diabetes Maternal Grandmother   . Cancer Sister        brain tumor    Social History:  reports that he quit smoking about 7 years ago. His smoking use included cigarettes. He has a 22.50 pack-year smoking history. He has never used smokeless tobacco. He reports current alcohol use of about 14.0 standard drinks of alcohol per week. He reports that he does not use drugs.   Physical Exam: BP 116/78   Pulse (!) 108  Ht 5' 7"  (1.702 m)   Wt 192 lb (87.1 kg)   BMI 30.07 kg/m   Constitutional:  Alert and oriented, No acute distress. HEENT: [place] AT, moist mucus membranes.  Trachea midline, no masses. Cardiovascular: No clubbing, cyanosis, or edema. Respiratory: Normal respiratory effort, no increased work of breathing. GU: Prostate 35 g, smooth without nodules    Assessment & Plan:    1. BPH without obstruction/lower urinary tract symptoms  Bladder scan PVR 2 mL  Benign DRE  PSA drawn today and if no significant change he may follow-up as needed or annually with his PCP.  He indicated he would like to continue annual follow-up here and an appointment was scheduled.   Abbie Sons, [place] [address] [place], [place]  [id number] [phone]

## 2020-08-05 ENCOUNTER — Encounter: Payer: Self-pay | Admitting: Urology

## 2020-08-05 LAB — URINALYSIS, COMPLETE
Bilirubin, UA: NEGATIVE
Glucose, UA: NEGATIVE
Leukocytes,UA: NEGATIVE
Nitrite, UA: NEGATIVE
Protein,UA: NEGATIVE
RBC, UA: NEGATIVE
Specific Gravity, UA: 1.025 (ref 1.005–1.030)
Urobilinogen, Ur: 1 mg/dL (ref 0.2–1.0)
pH, UA: 5 (ref 5.0–7.5)

## 2020-08-05 LAB — MICROSCOPIC EXAMINATION

## 2020-08-05 LAB — PSA: Prostate Specific Ag, Serum: 2.7 ng/mL (ref 0.0–4.0)

## 2020-08-07 ENCOUNTER — Other Ambulatory Visit: Payer: Self-pay | Admitting: Internal Medicine

## 2020-08-07 ENCOUNTER — Telehealth: Payer: Self-pay | Admitting: Internal Medicine

## 2020-08-07 ENCOUNTER — Other Ambulatory Visit: Payer: Self-pay | Admitting: *Deleted

## 2020-08-07 NOTE — Telephone Encounter (Signed)
Can you cancel the psa that is future ordered on patient?  Thanks

## 2020-08-07 NOTE — Telephone Encounter (Signed)
[facility] called from [place] office called to inform Dr.Tullo that they did patient PSA on 08-06-20 and she did not need to do it when he get his labs

## 2020-08-07 NOTE — Addendum Note (Signed)
Addended by: Leeanne Rio on: 08/07/2020 10:11 PM   Modules accepted: Orders

## 2020-08-08 NOTE — Telephone Encounter (Signed)
Done

## 2020-09-03 ENCOUNTER — Other Ambulatory Visit: Payer: Self-pay

## 2020-09-03 ENCOUNTER — Other Ambulatory Visit (INDEPENDENT_AMBULATORY_CARE_PROVIDER_SITE_OTHER): Payer: Medicare Other

## 2020-09-03 DIAGNOSIS — E119 Type 2 diabetes mellitus without complications: Secondary | ICD-10-CM

## 2020-09-03 LAB — COMPREHENSIVE METABOLIC PANEL
ALT: 32 U/L (ref 0–53)
AST: 23 U/L (ref 0–37)
Albumin: 4.2 g/dL (ref 3.5–5.2)
Alkaline Phosphatase: 66 U/L (ref 39–117)
BUN: 10 mg/dL (ref 6–23)
CO2: 29 mEq/L (ref 19–32)
Calcium: 9.5 mg/dL (ref 8.4–10.5)
Chloride: 101 mEq/L (ref 96–112)
Creatinine, Ser: 0.82 mg/dL (ref 0.40–1.50)
GFR: 89.44 mL/min (ref >60.00)
Glucose, Bld: 122 mg/dL — ABNORMAL HIGH (ref 70–99)
Potassium: 4.3 mEq/L (ref 3.5–5.1)
Sodium: 137 mEq/L (ref 135–145)
Total Bilirubin: 0.8 mg/dL (ref 0.2–1.2)
Total Protein: 6.9 g/dL (ref 6.0–8.3)

## 2020-09-03 LAB — LIPID PANEL
Cholesterol: 120 mg/dL (ref 0–200)
HDL: 52.9 mg/dL (ref >39.00)
LDL Cholesterol: 53 mg/dL (ref 0–99)
NonHDL: 66.74
Total CHOL/HDL Ratio: 2
Triglycerides: 70 mg/dL (ref 0.0–149.0)
VLDL: 14 mg/dL (ref 0.0–40.0)

## 2020-09-03 LAB — HEMOGLOBIN A1C: Hgb A1c MFr Bld: 7.3 % — ABNORMAL HIGH (ref 4.6–6.5)

## 2020-09-05 ENCOUNTER — Encounter: Payer: Self-pay | Admitting: Internal Medicine

## 2020-09-05 ENCOUNTER — Other Ambulatory Visit: Payer: Self-pay

## 2020-09-05 ENCOUNTER — Ambulatory Visit (INDEPENDENT_AMBULATORY_CARE_PROVIDER_SITE_OTHER): Payer: Medicare Other | Admitting: Internal Medicine

## 2020-09-05 DIAGNOSIS — R911 Solitary pulmonary nodule: Secondary | ICD-10-CM | POA: Diagnosis not present

## 2020-09-05 DIAGNOSIS — E1169 Type 2 diabetes mellitus with other specified complication: Secondary | ICD-10-CM

## 2020-09-05 DIAGNOSIS — E782 Mixed hyperlipidemia: Secondary | ICD-10-CM | POA: Diagnosis not present

## 2020-09-05 DIAGNOSIS — E1121 Type 2 diabetes mellitus with diabetic nephropathy: Secondary | ICD-10-CM

## 2020-09-05 DIAGNOSIS — K760 Fatty (change of) liver, not elsewhere classified: Secondary | ICD-10-CM

## 2020-09-05 DIAGNOSIS — I10 Essential (primary) hypertension: Secondary | ICD-10-CM

## 2020-09-05 NOTE — Patient Instructions (Addendum)
Your diabetes remains under very very good control  And your cholesterol and other labs are also normal. Please continue your current medications. return in 3 months for follow up on diabetes   make sure you are seeing your eye doctor at least once a year.   Non fasting labs can be done on June 10 or later    Diabetes Mellitus and Exercise Exercising regularly is important for overall health, especially for people who have diabetes mellitus. Exercising is not only about losing weight. It has many other health benefits, such as increasing muscle strength and bone density and reducing body fat and stress. This leads to improved fitness, flexibility, and endurance, all of which result in better overall health. What are the benefits of exercise if I have diabetes? Exercise has many benefits for people with diabetes. They include:  Helping to lower and control blood sugar (glucose).  Helping the body to respond better to the hormone insulin by improving insulin sensitivity.  Reducing how much insulin the body needs.  Lowering the risk for heart disease by: ? Lowering "bad" cholesterol and triglyceride levels. ? Increasing "good" cholesterol levels. ? Lowering blood pressure. ? Lowering blood glucose levels. What is my activity plan? Your health care provider or certified diabetes educator can help you make a plan for the type and frequency of exercise that works for you. This is called your activity plan. Be sure to:  Get at least 150 minutes of medium-intensity or high-intensity exercise each week. Exercises may include brisk walking, biking, or water aerobics.  Do stretching and strengthening exercises, such as yoga or weight lifting, at least 2 times a week.  Spread out your activity over at least 3 days of the week.  Get some form of physical activity each day. ? Do not go more than 2 days in a row without some kind of physical activity. ? Avoid being inactive for more than 90  minutes at a time. Take frequent breaks to walk or stretch.  Choose exercises or activities that you enjoy. Set realistic goals.  Start slowly and gradually increase your exercise intensity over time.   How do I manage my diabetes during exercise? Monitor your blood glucose  Check your blood glucose before and after exercising. If your blood glucose is: ? 240 mg/dL (13.3 mmol/L) or higher before you exercise, check your urine for ketones. These are chemicals created by the liver. If you have ketones in your urine, do not exercise until your blood glucose returns to normal. ? 100 mg/dL (5.6 mmol/L) or lower, eat a snack containing 15-20 grams of carbohydrate. Check your blood glucose 15 minutes after the snack to make sure that your glucose level is above 100 mg/dL (5.6 mmol/L) before you start your exercise.  Know the symptoms of low blood glucose (hypoglycemia) and how to treat it. Your risk for hypoglycemia increases during and after exercise. Follow these tips and your health care provider's instructions  Keep a carbohydrate snack that is fast-acting for use before, during, and after exercise to help prevent or treat hypoglycemia.  Avoid injecting insulin into areas of the body that are going to be exercised. For example, avoid injecting insulin into: ? Your arms, when you are about to play tennis. ? Your legs, when you are about to go jogging.  Keep records of your exercise habits. Doing this can help you and your health care provider adjust your diabetes management plan as needed. Write down: ? Food that you eat before  and after you exercise. ? Blood glucose levels before and after you exercise. ? The type and amount of exercise you have done.  Work with your health care provider when you start a new exercise or activity. He or she may need to: ? Make sure that the activity is safe for you. ? Adjust your insulin, other medicines, and food that you eat.  Drink plenty of water while  you exercise. This prevents loss of water (dehydration) and problems caused by a lot of heat in the body (heat stroke).   Where to find more information  American Diabetes Association: www.diabetes.org Summary  Exercising regularly is important for overall health, especially for people who have diabetes mellitus.  Exercising has many health benefits. It increases muscle strength and bone density and reduces body fat and stress. It also lowers and controls blood glucose.  Your health care provider or certified diabetes educator can help you make an activity plan for the type and frequency of exercise that works for you.  Work with your health care provider to make sure any new activity is safe for you. Also work with your health care provider to adjust your insulin, other medicines, and the food you eat. This information is not intended to replace advice given to you by your health care provider. Make sure you discuss any questions you have with your health care provider. Document Revised: 03/12/2019 Document Reviewed: 03/12/2019 Elsevier Patient Education  [place].

## 2020-09-05 NOTE — Progress Notes (Signed)
Subjective:  Patient ID: [name], male    DOB: Jul 22, 1950  Age: 70 y.o. MRN: [id number]  CC: Diagnoses of Diabetic nephropathy associated with type 2 diabetes mellitus ([place]), DM type 2 with diabetic mixed hyperlipidemia ([place]), Essential hypertension, Pulmonary nodule, right, and Hepatic steatosis were pertinent to this visit.  HPI Arsalan Brisbin. presents for diabetes follow up  This visit occurred during the SARS-CoV-2 public health emergency.  Safety protocols were in place, including screening questions prior to the visit, additional usage of staff PPE, and extensive cleaning of exam room while observing appropriate contact time as indicated for disinfecting solutions.   HTN:  Patient is taking his medications as prescribed and notes no adverse effects.  Home BP readings have been once or twice daily and are  generally < 140/80 .  he is avoiding added salt in her diet and walking regularly about 3 times per week for exercise  .  Misses his evening doses but changed administration  to dinner   He feels generally well, is exercising several times per week and checking blood sugars once daily at variable times.  BS have been under 130 fasting and < 150 post prandially.  Denies any recent hypoglyemic events.  Taking his medications as directed. Following a carbohydrate modified diet 6 days per week. Denies numbness, burning and tingling of extremities. Appetite is good.     Lab Results  Component Value Date   HGBA1C 7.3 (H) 09/03/2020     Outpatient Medications Prior to Visit  Medication Sig Dispense Refill  . amLODipine (NORVASC) 10 MG tablet TAKE 1 TABLET DAILY 90 tablet 3  . aspirin 81 MG tablet Take 81 mg by mouth daily.    [name] atorvastatin (LIPITOR) 80 MG tablet TAKE 1 TABLET DAILY 90 tablet 3  . blood glucose meter kit and supplies USE TO CHECK BLOOD SUGARS UP TO TWO TIMES DAILY 1 each 0  . Blood Glucose Monitoring Suppl (FREESTYLE FREEDOM LITE) w/Device KIT 1  application by Does not apply route 2 (two) times daily. 1 kit 0  . glucose blood (FREESTYLE LITE) test strip USE TO CHECK SUGARS TWICE A DAY 200 each 1  . hydrochlorothiazide (HYDRODIURIL) 25 MG tablet TAKE 1 TABLET DAILY 90 tablet 3  . JANUVIA 100 MG tablet TAKE 1 TABLET DAILY 90 tablet 3  . Krill Oil 300 MG CAPS Take 1 capsule by mouth daily.    . Lancets (FREESTYLE) lancets USE TO CHECK SUGAR TWICE A DAY 200 each 3  . metFORMIN (GLUCOPHAGE) 1000 MG tablet TAKE 1 TABLET TWICE A DAY WITH MEALS 180 tablet 3  . Multiple Vitamins-Minerals (CENTRUM SILVER ADULT 50+ PO) Take 1 tablet by mouth daily.    [name] telmisartan (MICARDIS) 40 MG tablet TAKE 1 TABLET AT BEDTIME 90 tablet 3   No facility-administered medications prior to visit.    Review of Systems;  Patient denies headache, fevers, malaise, unintentional weight loss, skin rash, eye pain, sinus congestion and sinus pain, sore throat, dysphagia,  hemoptysis , cough, dyspnea, wheezing, chest pain, palpitations, orthopnea, edema, abdominal pain, nausea, melena, diarrhea, constipation, flank pain, dysuria, hematuria, urinary  Frequency, nocturia, numbness, tingling, seizures,  Focal weakness, Loss of consciousness,  Tremor, insomnia, depression, anxiety, and suicidal ideation.      Objective:  BP 120/82 (BP Location: Left Arm, Patient Position: Sitting, Cuff Size: Normal)   Pulse 88   Temp 98.6 F (37 C) (Oral)   Resp 15   Ht _0  (  1.702 m)   Wt 192 lb 9.6 oz (87.4 kg)   SpO2 98%   BMI 30.17 kg/m   BP Readings from Last 3 Encounters:  09/05/20 120/82  08/04/20 116/78  06/06/20 122/80    Wt Readings from Last 3 Encounters:  09/05/20 192 lb 9.6 oz (87.4 kg)  08/04/20 192 lb (87.1 kg)  07/09/20 193 lb (87.5 kg)    General appearance: alert, cooperative and appears stated age Ears: normal TM's and external ear canals both ears Throat: lips, mucosa, and tongue normal; teeth and gums normal Neck: no adenopathy, no carotid bruit,  supple, symmetrical, trachea midline and thyroid not enlarged, symmetric, no tenderness/mass/nodules Back: symmetric, no curvature. ROM normal. No CVA tenderness. Lungs: clear to auscultation bilaterally Heart: regular rate and rhythm, S1, S2 normal, no murmur, click, rub or gallop Abdomen: soft, non-tender; bowel sounds normal; no masses,  no organomegaly Pulses: 2+ and symmetric Skin: Skin color, texture, turgor normal. No rashes or lesions Lymph nodes: Cervical, supraclavicular, and axillary nodes normal.  Lab Results  Component Value Date   HGBA1C 7.3 (H) 09/03/2020   HGBA1C 7.4 (H) 06/04/2020   HGBA1C 7.5 (H) 03/04/2020    Lab Results  Component Value Date   CREATININE 0.82 09/03/2020   CREATININE 0.92 06/04/2020   CREATININE 0.90 03/04/2020    Lab Results  Component Value Date   WBC 5.4 06/07/2018   HGB 14.2 06/07/2018   HCT 43.6 06/07/2018   PLT 170.0 06/07/2018   GLUCOSE 122 (H) 09/03/2020   CHOL 120 09/03/2020   TRIG 70.0 09/03/2020   HDL 52.90 09/03/2020   LDLDIRECT 59.0 08/31/2016   LDLCALC 53 09/03/2020   ALT 32 09/03/2020   AST 23 09/03/2020   NA 137 09/03/2020   K 4.3 09/03/2020   CL 101 09/03/2020   CREATININE 0.82 09/03/2020   BUN 10 09/03/2020   CO2 29 09/03/2020   PSA 2.48 07/03/2019   INR 1.1 (H) 10/09/2018   HGBA1C 7.3 (H) 09/03/2020   MICROALBUR <0.7 03/04/2020    CT CHEST LUNG CANCER SCREENING LOW DOSE WO CONTRAST  Result Date: 07/09/2020 CLINICAL DATA:  70 year old asymptomatic male former smoker with 22.5 pack-year smoking history, quit smoking 7 years prior. EXAM: CT CHEST WITHOUT CONTRAST LOW-DOSE FOR LUNG CANCER SCREENING TECHNIQUE: Multidetector CT imaging of the chest was performed following the standard protocol without IV contrast. COMPARISON:  None. FINDINGS: Cardiovascular: Normal heart size. No significant pericardial effusion/thickening. Great vessels are normal in course and caliber. Mediastinum/Nodes: No discrete thyroid nodules.  Unremarkable esophagus. No pathologically enlarged axillary, mediastinal or hilar lymph nodes, noting limited sensitivity for the detection of hilar adenopathy on this noncontrast study. Lungs/Pleura: No pneumothorax. No pleural effusion. No acute consolidative airspace disease or lung masses. Right lower lobe solid pulmonary nodule measuring 4.7 mm in volume derived mean diameter (series 3/image 169). No additional significant pulmonary nodules. Upper abdomen: Mild diffuse hepatic steatosis. Simple 1.4 cm inferior right liver cyst. Musculoskeletal: No aggressive appearing focal osseous lesions. Degenerative changes in the visualized cervical spine. IMPRESSION: 1. Lung-RADS 2, benign appearance or behavior. Continue annual screening with low-dose chest CT without contrast in 12 months. 2. Mild diffuse hepatic steatosis. Electronically Signed   By: Ilona Sorrel M.D.   On: 07/09/2020 11:46    Assessment & Plan:   Problem List Items Addressed This Visit      Unprioritized   Essential hypertension    Well controlled on current regimen of telmisartan, hct and amlodipine  . Renal function stable,  no changes today.      DM type 2 with diabetic mixed hyperlipidemia ([place])    Improving control on current regimen of metformin and Januvia.  continue statin and ARB.  No changes today.    Lab Results  Component Value Date   HGBA1C 7.3 (H) 09/03/2020         Hepatic steatosis    He has early fibrosis by recent Fibroscan .  He is abstaining  from alcohol and LFTs are normal.   Lab Results  Component Value Date   ALT 32 09/03/2020   AST 23 09/03/2020   ALKPHOS 66 09/03/2020   BILITOT 0.8 09/03/2020         Diabetic nephropathy associated with type 2 diabetes mellitus ([place])   Relevant Orders   Hemoglobin A1c   Comprehensive metabolic panel   Pulmonary nodule, right    Stable by annual low dose CT done every January         I am having Lynnette Caffey. maintain his aspirin, Astrid Drafts,  Multiple Vitamins-Minerals (CENTRUM SILVER ADULT 50+ PO), blood glucose meter kit and supplies, FreeStyle Freedom Lite, hydrochlorothiazide, FREESTYLE LITE, metFORMIN, atorvastatin, Januvia, amLODipine, telmisartan, and freestyle.  No orders of the defined types were placed in this encounter.   There are no discontinued medications.  Follow-up: Return in about 3 months (around 12/06/2020).   [name], MD

## 2020-09-07 DIAGNOSIS — R911 Solitary pulmonary nodule: Secondary | ICD-10-CM | POA: Insufficient documentation

## 2020-09-07 NOTE — Assessment & Plan Note (Addendum)
Improving control on current regimen of metformin and Januvia.  continue statin and ARB.  No changes today.    Lab Results  Component Value Date   HGBA1C 7.3 (H) 09/03/2020

## 2020-09-07 NOTE — Assessment & Plan Note (Addendum)
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  He is abstaining  from alcohol and LFTs are normal.   Lab Results  Component Value Date   ALT 32 09/03/2020   AST 23 09/03/2020   ALKPHOS 66 09/03/2020   BILITOT 0.8 09/03/2020

## 2020-09-07 NOTE — Assessment & Plan Note (Addendum)
Well controlled on current regimen of telmisartan, hct and amlodipine  . Renal function stable, no changes today.

## 2020-09-07 NOTE — Assessment & Plan Note (Signed)
Stable by annual low dose CT done every January ?

## 2020-10-09 DIAGNOSIS — K760 Fatty (change of) liver, not elsewhere classified: Secondary | ICD-10-CM | POA: Diagnosis not present

## 2020-10-09 DIAGNOSIS — K7401 Hepatic fibrosis, early fibrosis: Secondary | ICD-10-CM | POA: Insufficient documentation

## 2020-10-30 ENCOUNTER — Other Ambulatory Visit: Payer: Self-pay | Admitting: Internal Medicine

## 2020-11-21 ENCOUNTER — Other Ambulatory Visit: Payer: Self-pay | Admitting: Internal Medicine

## 2020-12-03 DIAGNOSIS — H40003 Preglaucoma, unspecified, bilateral: Secondary | ICD-10-CM | POA: Diagnosis not present

## 2020-12-03 LAB — HM DIABETES EYE EXAM

## 2020-12-08 ENCOUNTER — Other Ambulatory Visit: Payer: Self-pay

## 2020-12-08 ENCOUNTER — Other Ambulatory Visit (INDEPENDENT_AMBULATORY_CARE_PROVIDER_SITE_OTHER): Payer: Medicare Other

## 2020-12-08 ENCOUNTER — Other Ambulatory Visit: Payer: Medicare Other

## 2020-12-08 DIAGNOSIS — E1121 Type 2 diabetes mellitus with diabetic nephropathy: Secondary | ICD-10-CM | POA: Diagnosis not present

## 2020-12-08 LAB — COMPREHENSIVE METABOLIC PANEL
ALT: 29 U/L (ref 0–53)
AST: 24 U/L (ref 0–37)
Albumin: 4.2 g/dL (ref 3.5–5.2)
Alkaline Phosphatase: 65 U/L (ref 39–117)
BUN: 12 mg/dL (ref 6–23)
CO2: 26 mEq/L (ref 19–32)
Calcium: 9 mg/dL (ref 8.4–10.5)
Chloride: 103 mEq/L (ref 96–112)
Creatinine, Ser: 0.86 mg/dL (ref 0.40–1.50)
GFR: 88 mL/min (ref >60.00)
Glucose, Bld: 122 mg/dL — ABNORMAL HIGH (ref 70–99)
Potassium: 4.1 mEq/L (ref 3.5–5.1)
Sodium: 138 mEq/L (ref 135–145)
Total Bilirubin: 0.8 mg/dL (ref 0.2–1.2)
Total Protein: 6.7 g/dL (ref 6.0–8.3)

## 2020-12-08 LAB — HEMOGLOBIN A1C: Hgb A1c MFr Bld: 7.3 % — ABNORMAL HIGH (ref 4.6–6.5)

## 2020-12-10 ENCOUNTER — Ambulatory Visit: Payer: Medicare Other | Admitting: Internal Medicine

## 2020-12-11 ENCOUNTER — Ambulatory Visit: Payer: Medicare Other | Admitting: Internal Medicine

## 2020-12-12 ENCOUNTER — Other Ambulatory Visit: Payer: Self-pay

## 2020-12-12 ENCOUNTER — Ambulatory Visit (INDEPENDENT_AMBULATORY_CARE_PROVIDER_SITE_OTHER): Payer: Medicare Other | Admitting: Internal Medicine

## 2020-12-12 ENCOUNTER — Encounter: Payer: Self-pay | Admitting: Internal Medicine

## 2020-12-12 VITALS — BP 130/84 | HR 86 | Temp 96.5°F | Ht 67.01 in | Wt 192.0 lb

## 2020-12-12 DIAGNOSIS — E7800 Pure hypercholesterolemia, unspecified: Secondary | ICD-10-CM

## 2020-12-12 DIAGNOSIS — I10 Essential (primary) hypertension: Secondary | ICD-10-CM

## 2020-12-12 DIAGNOSIS — E1169 Type 2 diabetes mellitus with other specified complication: Secondary | ICD-10-CM | POA: Diagnosis not present

## 2020-12-12 DIAGNOSIS — E782 Mixed hyperlipidemia: Secondary | ICD-10-CM

## 2020-12-12 DIAGNOSIS — K760 Fatty (change of) liver, not elsewhere classified: Secondary | ICD-10-CM | POA: Diagnosis not present

## 2020-12-12 NOTE — Assessment & Plan Note (Signed)
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  He is no longer abstaining  from alcohol , but LFTs are normal. Cautioned against regular alcohol use.   Lab Results  Component Value Date   ALT 29 12/08/2020   AST 24 12/08/2020   ALKPHOS 65 12/08/2020   BILITOT 0.8 12/08/2020

## 2020-12-12 NOTE — Patient Instructions (Addendum)
We made NO CHANGES TODAY to your medications.  However,  I want you to : Alternate your blood sugar checks between fasting (morning)   and 2 hours after dinner

## 2020-12-12 NOTE — Progress Notes (Signed)
Subjective:  Patient ID: [name], male    DOB: 1950/09/11  Age: 69 y.o. MRN: [id number]  CC: The primary encounter diagnosis was Pure hypercholesterolemia. Diagnoses of DM type 2 with diabetic mixed hyperlipidemia ([place]), Hepatic steatosis, and Essential hypertension were also pertinent to this visit.  HPI Jaqualin Serpa. presents for follow up on type 2 DM, hypertension and hyperlipidemia   This visit occurred during the SARS-CoV-2 public health emergency.  Safety protocols were in place, including screening questions prior to the visit, additional usage of staff PPE, and extensive cleaning of exam room while observing appropriate contact time as indicated for disinfecting solutions.    He feels generally well, is  doing yard work several times per week and checking blood sugars once daily in the morning   BS have been under 160  but >120   no low sugars.  He forgets to take his evening metfomrin on average 2 times per week .  Denies any recent hypoglyemic events.  Following a carbohydrate modified diet 5 days per week. Denies numbness, burning and tingling of extremities. Appetite is good.   Hypertension: patient checks blood pressure daily at home.  Readings have been for the most part < 140/80 at rest . But is often elevated when he forgets to take his telmisartan in the evening (2 nights per week) Patient is following a reduce salt diet most days and is taking amlodipine regularly in the morning        Outpatient Medications Prior to Visit  Medication Sig Dispense Refill   amLODipine (NORVASC) 10 MG tablet TAKE 1 TABLET DAILY 90 tablet 3   atorvastatin (LIPITOR) 80 MG tablet TAKE 1 TABLET DAILY 90 tablet 3   blood glucose meter kit and supplies USE TO CHECK BLOOD SUGARS UP TO TWO TIMES DAILY 1 each 0   Blood Glucose Monitoring Suppl (FREESTYLE FREEDOM LITE) w/Device KIT 1 application by Does not apply route 2 (two) times daily. 1 kit 0   glucose blood (FREESTYLE  LITE) test strip USE TO CHECK SUGARS TWICE A DAY 200 each 1   hydrochlorothiazide (HYDRODIURIL) 25 MG tablet TAKE 1 TABLET DAILY 90 tablet 3   JANUVIA 100 MG tablet TAKE 1 TABLET DAILY 90 tablet 3   Krill Oil 300 MG CAPS Take 1 capsule by mouth daily.     Lancets (FREESTYLE) lancets USE TO CHECK SUGAR TWICE A DAY 200 each 3   metFORMIN (GLUCOPHAGE) 1000 MG tablet TAKE 1 TABLET TWICE A DAY WITH MEALS 180 tablet 3   Multiple Vitamins-Minerals (CENTRUM SILVER ADULT 50+ PO) Take 1 tablet by mouth daily.     telmisartan (MICARDIS) 40 MG tablet TAKE 1 TABLET AT BEDTIME 90 tablet 3   No facility-administered medications prior to visit.    Review of Systems;  Patient denies headache, fevers, malaise, unintentional weight loss, skin rash, eye pain, sinus congestion and sinus pain, sore throat, dysphagia,  hemoptysis , cough, dyspnea, wheezing, chest pain, palpitations, orthopnea, edema, abdominal pain, nausea, melena, diarrhea, constipation, flank pain, dysuria, hematuria, urinary  Frequency, nocturia, numbness, tingling, seizures,  Focal weakness, Loss of consciousness,  Tremor, insomnia, depression, anxiety, and suicidal ideation.      Objective:  BP 130/84 (BP Location: Left Arm, Patient Position: Sitting)   Pulse 86   Temp (!) 96.5 F (35.8 C)   Ht 5' 7.01" (1.702 m)   Wt 192 lb (87.1 kg)   SpO2 98%   BMI 30.06 kg/m   BP  Readings from Last 3 Encounters:  12/12/20 130/84  09/05/20 120/82  08/04/20 116/78    Wt Readings from Last 3 Encounters:  12/12/20 192 lb (87.1 kg)  09/05/20 192 lb 9.6 oz (87.4 kg)  08/04/20 192 lb (87.1 kg)    General appearance: alert, cooperative and appears stated age Ears: normal TM's and external ear canals both ears Throat: lips, mucosa, and tongue normal; teeth and gums normal Neck: no adenopathy, no carotid bruit, supple, symmetrical, trachea midline and thyroid not enlarged, symmetric, no tenderness/mass/nodules Back: symmetric, no curvature. ROM  normal. No CVA tenderness. Lungs: clear to auscultation bilaterally Heart: regular rate and rhythm, S1, S2 normal, no murmur, click, rub or gallop Abdomen: soft, non-tender; bowel sounds normal; no masses,  no organomegaly Pulses: 2+ and symmetric Skin: Skin color, texture, turgor normal. No rashes or lesions Lymph nodes: Cervical, supraclavicular, and axillary nodes normal.  Lab Results  Component Value Date   HGBA1C 7.3 (H) 12/08/2020   HGBA1C 7.3 (H) 09/03/2020   HGBA1C 7.4 (H) 06/04/2020    Lab Results  Component Value Date   CREATININE 0.86 12/08/2020   CREATININE 0.82 09/03/2020   CREATININE 0.92 06/04/2020    Lab Results  Component Value Date   WBC 5.4 06/07/2018   HGB 14.2 06/07/2018   HCT 43.6 06/07/2018   PLT 170.0 06/07/2018   GLUCOSE 122 (H) 12/08/2020   CHOL 120 09/03/2020   TRIG 70.0 09/03/2020   HDL 52.90 09/03/2020   LDLDIRECT 59.0 08/31/2016   LDLCALC 53 09/03/2020   ALT 29 12/08/2020   AST 24 12/08/2020   NA 138 12/08/2020   K 4.1 12/08/2020   CL 103 12/08/2020   CREATININE 0.86 12/08/2020   BUN 12 12/08/2020   CO2 26 12/08/2020   PSA 2.48 07/03/2019   INR 1.1 (H) 10/09/2018   HGBA1C 7.3 (H) 12/08/2020   MICROALBUR <0.7 03/04/2020    CT CHEST LUNG CANCER SCREENING LOW DOSE WO CONTRAST  Result Date: 07/09/2020 CLINICAL DATA:  70 year old asymptomatic male former smoker with 22.5 pack-year smoking history, quit smoking 7 years prior. EXAM: CT CHEST WITHOUT CONTRAST LOW-DOSE FOR LUNG CANCER SCREENING TECHNIQUE: Multidetector CT imaging of the chest was performed following the standard protocol without IV contrast. COMPARISON:  None. FINDINGS: Cardiovascular: Normal heart size. No significant pericardial effusion/thickening. Great vessels are normal in course and caliber. Mediastinum/Nodes: No discrete thyroid nodules. Unremarkable esophagus. No pathologically enlarged axillary, mediastinal or hilar lymph nodes, noting limited sensitivity for the  detection of hilar adenopathy on this noncontrast study. Lungs/Pleura: No pneumothorax. No pleural effusion. No acute consolidative airspace disease or lung masses. Right lower lobe solid pulmonary nodule measuring 4.7 mm in volume derived mean diameter (series 3/image 169). No additional significant pulmonary nodules. Upper abdomen: Mild diffuse hepatic steatosis. Simple 1.4 cm inferior right liver cyst. Musculoskeletal: No aggressive appearing focal osseous lesions. Degenerative changes in the visualized cervical spine. IMPRESSION: 1. Lung-RADS 2, benign appearance or behavior. Continue annual screening with low-dose chest CT without contrast in 12 months. 2. Mild diffuse hepatic steatosis. Electronically Signed   By: Ilona Sorrel M.D.   On: 07/09/2020 11:46    Assessment & Plan:   Problem List Items Addressed This Visit       Unprioritized   Essential hypertension    Advised to simplify regimen and take telmisartan with amlodipine in the am .         DM type 2 with diabetic mixed hyperlipidemia ([place])    Stable control on current regimen  of metformin and Januvia.  continue statin and ARB.  Discussed adding ozempic (preferred over patient's request for glipizide) , but he has deferred in favor of a 3 month trial of "increased effort at adherence." (He falls asleep before taking evening meds).  No changes today.    Lab Results  Component Value Date   HGBA1C 7.3 (H) 12/08/2020          Relevant Orders   Lipid panel   Hemoglobin A1c   Microalbumin / creatinine urine ratio   Comprehensive metabolic panel   Hyperlipidemia - Primary   Hepatic steatosis    He has early fibrosis by recent Fibroscan .  He is no longer abstaining  from alcohol , but LFTs are normal. Cautioned against regular alcohol use.   Lab Results  Component Value Date   ALT 29 12/08/2020   AST 24 12/08/2020   ALKPHOS 65 12/08/2020   BILITOT 0.8 12/08/2020          I provided  30 minutes of face-to-face time  during this encounter reviewing patient's  blood sugar and BP diaries,  current labs and past surgeries, and imaging studies, providing counseling on the above mentioned problems , and coordination  of care .   I am having Lynnette Caffey. maintain his Astrid Drafts, Multiple Vitamins-Minerals (CENTRUM SILVER ADULT 50+ PO), blood glucose meter kit and supplies, FreeStyle Freedom Lite, FREESTYLE LITE, metFORMIN, atorvastatin, Januvia, amLODipine, telmisartan, freestyle, and hydrochlorothiazide.  No orders of the defined types were placed in this encounter.   There are no discontinued medications.  Follow-up: Return in about 3 months (around 03/14/2021) for follow up diabetes.   [name], MD

## 2020-12-12 NOTE — Assessment & Plan Note (Signed)
Advised to simplify regimen and take telmisartan with amlodipine in the am .

## 2020-12-12 NOTE — Assessment & Plan Note (Addendum)
Stable control on current regimen of metformin and Januvia.  continue statin and ARB.  Discussed adding ozempic (preferred over patient's request for glipizide) , but he has deferred in favor of a 3 month trial of "increased effort at adherence." (He falls asleep before taking evening meds).  No changes today.    Lab Results  Component Value Date   HGBA1C 7.3 (H) 12/08/2020

## 2021-02-06 ENCOUNTER — Ambulatory Visit: Payer: Medicare Other

## 2021-03-16 ENCOUNTER — Other Ambulatory Visit (INDEPENDENT_AMBULATORY_CARE_PROVIDER_SITE_OTHER): Payer: Medicare Other

## 2021-03-16 ENCOUNTER — Other Ambulatory Visit: Payer: Self-pay

## 2021-03-16 ENCOUNTER — Other Ambulatory Visit: Payer: Medicare Other

## 2021-03-16 DIAGNOSIS — E782 Mixed hyperlipidemia: Secondary | ICD-10-CM

## 2021-03-16 DIAGNOSIS — E1169 Type 2 diabetes mellitus with other specified complication: Secondary | ICD-10-CM | POA: Diagnosis not present

## 2021-03-16 LAB — HEMOGLOBIN A1C: Hgb A1c MFr Bld: 7.2 % — ABNORMAL HIGH (ref 4.6–6.5)

## 2021-03-16 LAB — MICROALBUMIN / CREATININE URINE RATIO
Creatinine,U: 122.2 mg/dL
Microalb Creat Ratio: 0.7 mg/g (ref 0.0–30.0)
Microalb, Ur: 0.9 mg/dL (ref 0.0–1.9)

## 2021-03-16 LAB — LIPID PANEL
Cholesterol: 136 mg/dL (ref 0–200)
HDL: 55.6 mg/dL (ref >39.00)
LDL Cholesterol: 71 mg/dL (ref 0–99)
NonHDL: 80
Total CHOL/HDL Ratio: 2
Triglycerides: 46 mg/dL (ref 0.0–149.0)
VLDL: 9.2 mg/dL (ref 0.0–40.0)

## 2021-03-16 LAB — COMPREHENSIVE METABOLIC PANEL
ALT: 29 U/L (ref 0–53)
AST: 21 U/L (ref 0–37)
Albumin: 4.5 g/dL (ref 3.5–5.2)
Alkaline Phosphatase: 59 U/L (ref 39–117)
BUN: 12 mg/dL (ref 6–23)
CO2: 31 mEq/L (ref 19–32)
Calcium: 9.6 mg/dL (ref 8.4–10.5)
Chloride: 100 mEq/L (ref 96–112)
Creatinine, Ser: 0.9 mg/dL (ref 0.40–1.50)
GFR: 86.64 mL/min (ref >60.00)
Glucose, Bld: 128 mg/dL — ABNORMAL HIGH (ref 70–99)
Potassium: 4.1 mEq/L (ref 3.5–5.1)
Sodium: 139 mEq/L (ref 135–145)
Total Bilirubin: 0.7 mg/dL (ref 0.2–1.2)
Total Protein: 7.3 g/dL (ref 6.0–8.3)

## 2021-03-18 ENCOUNTER — Encounter: Payer: Self-pay | Admitting: Internal Medicine

## 2021-03-18 ENCOUNTER — Telehealth: Payer: Self-pay | Admitting: Internal Medicine

## 2021-03-18 ENCOUNTER — Ambulatory Visit (INDEPENDENT_AMBULATORY_CARE_PROVIDER_SITE_OTHER): Payer: Medicare Other | Admitting: Internal Medicine

## 2021-03-18 ENCOUNTER — Other Ambulatory Visit: Payer: Self-pay

## 2021-03-18 VITALS — BP 126/74 | HR 94 | Temp 95.6°F | Ht 67.0 in | Wt 192.6 lb

## 2021-03-18 DIAGNOSIS — Z125 Encounter for screening for malignant neoplasm of prostate: Secondary | ICD-10-CM

## 2021-03-18 DIAGNOSIS — K760 Fatty (change of) liver, not elsewhere classified: Secondary | ICD-10-CM

## 2021-03-18 DIAGNOSIS — I10 Essential (primary) hypertension: Secondary | ICD-10-CM | POA: Diagnosis not present

## 2021-03-18 DIAGNOSIS — E1121 Type 2 diabetes mellitus with diabetic nephropathy: Secondary | ICD-10-CM | POA: Diagnosis not present

## 2021-03-18 DIAGNOSIS — E782 Mixed hyperlipidemia: Secondary | ICD-10-CM

## 2021-03-18 DIAGNOSIS — E1169 Type 2 diabetes mellitus with other specified complication: Secondary | ICD-10-CM

## 2021-03-18 NOTE — Patient Instructions (Signed)
You are doing great!  If your Blood pressure trends down and stays < 120/70, reduce the hctz to 12.5 mg daily (1/2 tablet) but continue telmisartan at  40mg  daily    I'll see you in 6 months for follow up

## 2021-03-18 NOTE — Assessment & Plan Note (Addendum)
BS are at goal on metformin , Januvia , statin .  ASA stopped .  Exercise goals advised to lower weight   Lab Results  Component Value Date   HGBA1C 7.2 (H) 03/16/2021   Lab Results  Component Value Date   LABMICR See below: 08/04/2020   MICROALBUR 0.9 03/16/2021   MICROALBUR <0.7 03/04/2020     Lab Results  Component Value Date   CHOL 136 03/16/2021   HDL 55.60 03/16/2021   LDLCALC 71 03/16/2021   LDLDIRECT 59.0 08/31/2016   TRIG 46.0 03/16/2021   CHOLHDL 2 03/16/2021

## 2021-03-18 NOTE — Telephone Encounter (Signed)
Lab orders placed.  

## 2021-03-18 NOTE — Assessment & Plan Note (Addendum)
Well controlled on current regimen of hctz , amlodipine and telmisartan.  Renal function stable, no changes today. Advised to reduce hctz dose to 12.5 mg daily if BP trends down and readings are under 110/70 for more than a few days in a row  Lab Results  Component Value Date   CREATININE 0.90 03/16/2021   Lab Results  Component Value Date   NA 139 03/16/2021   K 4.1 03/16/2021   CL 100 03/16/2021   CO2 31 03/16/2021

## 2021-03-18 NOTE — Assessment & Plan Note (Signed)
Managed with telmisartan .most recent UA was negative for protein .  Lab Results  Component Value Date   MICROALBUR 0.9 03/16/2021

## 2021-03-18 NOTE — Assessment & Plan Note (Signed)
Continue metformin and statin.    Lab Results  Component Value Date   ALT 29 03/16/2021   AST 21 03/16/2021   ALKPHOS 59 03/16/2021   BILITOT 0.7 03/16/2021

## 2021-03-18 NOTE — Telephone Encounter (Signed)
Patient scheduled for labs 08/2021 as requested by check out note.   Needing orders placed.

## 2021-03-18 NOTE — Progress Notes (Signed)
Subjective:  Patient ID: Robert Lat., male    DOB: 15-Apr-1951  Age: 70 y.o. MRN: [id number]  CC: The primary encounter diagnosis was Prostate cancer screening. Diagnoses of DM type 2 with diabetic mixed hyperlipidemia ([place]), Essential hypertension, Diabetic nephropathy associated with type 2 diabetes mellitus ([place]), and Hepatic steatosis were also pertinent to this visit.  HPI Robert Dapolito. presents for  Chief Complaint  Patient presents with   Follow-up    3 month follow up on diabetes   1) Type DM:   He feels generally well, is exercising r3 times per week and checking fasting  blood sugars once daily  in the morning .  BS have been under 130 fasting.   Denies any recent hypoglyemic events.  Taking his medications as directed. Following a carbohydrate modified diet 75% of the time.  . Denies numbness, burning and tingling of extremities. Appetite is good.   2) HTN:  Patient is taking amlodipine, hctz and telmisartan  as prescribed and notes no adverse effects.  Home BP readings have been done  daily and are  generally < 130/80 .  he is avoiding added salt in her diet and walking regularly about 3 times per week for exercise  .     Outpatient Medications Prior to Visit  Medication Sig Dispense Refill   amLODipine (NORVASC) 10 MG tablet TAKE 1 TABLET DAILY 90 tablet 3   atorvastatin (LIPITOR) 80 MG tablet TAKE 1 TABLET DAILY 90 tablet 3   blood glucose meter kit and supplies USE TO CHECK BLOOD SUGARS UP TO TWO TIMES DAILY 1 each 0   Blood Glucose Monitoring Suppl (FREESTYLE FREEDOM LITE) w/Device KIT 1 application by Does not apply route 2 (two) times daily. 1 kit 0   glucose blood (FREESTYLE LITE) test strip USE TO CHECK SUGARS TWICE A DAY 200 each 1   hydrochlorothiazide (HYDRODIURIL) 25 MG tablet TAKE 1 TABLET DAILY 90 tablet 3   JANUVIA 100 MG tablet TAKE 1 TABLET DAILY 90 tablet 3   Krill Oil 300 MG CAPS Take 1 capsule by mouth daily.     Lancets (FREESTYLE)  lancets USE TO CHECK SUGAR TWICE A DAY 200 each 3   metFORMIN (GLUCOPHAGE) 1000 MG tablet TAKE 1 TABLET TWICE A DAY WITH MEALS 180 tablet 3   Multiple Vitamins-Minerals (CENTRUM SILVER ADULT 50+ PO) Take 1 tablet by mouth daily.     telmisartan (MICARDIS) 40 MG tablet TAKE 1 TABLET AT BEDTIME 90 tablet 3   No facility-administered medications prior to visit.    Review of Systems;  Patient denies headache, fevers, malaise, unintentional weight loss, skin rash, eye pain, sinus congestion and sinus pain, sore throat, dysphagia,  hemoptysis , cough, dyspnea, wheezing, chest pain, palpitations, orthopnea, edema, abdominal pain, nausea, melena, diarrhea, constipation, flank pain, dysuria, hematuria, urinary  Frequency, nocturia, numbness, tingling, seizures,  Focal weakness, Loss of consciousness,  Tremor, insomnia, depression, anxiety, and suicidal ideation.      Objective:  BP 126/74 (BP Location: Left Arm, Patient Position: Sitting, Cuff Size: Normal)   Pulse 94   Temp (!) 95.6 F (35.3 C) (Temporal)   Ht 5' 7" (1.702 m)   Wt 192 lb 9.6 oz (87.4 kg)   SpO2 97%   BMI 30.17 kg/m   BP Readings from Last 3 Encounters:  03/18/21 126/74  12/12/20 130/84  09/05/20 120/82    Wt Readings from Last 3 Encounters:  03/18/21 192 lb 9.6 oz (87.4 kg)  12/12/20  192 lb (87.1 kg)  09/05/20 192 lb 9.6 oz (87.4 kg)    General appearance: alert, cooperative and appears stated age Ears: normal TM's and external ear canals both ears Throat: lips, mucosa, and tongue normal; teeth and gums normal Neck: no adenopathy, no carotid bruit, supple, symmetrical, trachea midline and thyroid not enlarged, symmetric, no tenderness/mass/nodules Back: symmetric, no curvature. ROM normal. No CVA tenderness. Lungs: clear to auscultation bilaterally Heart: regular rate and rhythm, S1, S2 normal, no murmur, click, rub or gallop Abdomen: soft, non-tender; bowel sounds normal; no masses,  no organomegaly Pulses: 2+  and symmetric Skin: Skin color, texture, turgor normal. No rashes or lesions Lymph nodes: Cervical, supraclavicular, and axillary nodes normal.  Lab Results  Component Value Date   HGBA1C 7.2 (H) 03/16/2021   HGBA1C 7.3 (H) 12/08/2020   HGBA1C 7.3 (H) 09/03/2020    Lab Results  Component Value Date   CREATININE 0.90 03/16/2021   CREATININE 0.86 12/08/2020   CREATININE 0.82 09/03/2020    Lab Results  Component Value Date   WBC 5.4 06/07/2018   HGB 14.2 06/07/2018   HCT 43.6 06/07/2018   PLT 170.0 06/07/2018   GLUCOSE 128 (H) 03/16/2021   CHOL 136 03/16/2021   TRIG 46.0 03/16/2021   HDL 55.60 03/16/2021   LDLDIRECT 59.0 08/31/2016   LDLCALC 71 03/16/2021   ALT 29 03/16/2021   AST 21 03/16/2021   NA 139 03/16/2021   K 4.1 03/16/2021   CL 100 03/16/2021   CREATININE 0.90 03/16/2021   BUN 12 03/16/2021   CO2 31 03/16/2021   PSA 2.48 07/03/2019   INR 1.1 (H) 10/09/2018   HGBA1C 7.2 (H) 03/16/2021   MICROALBUR 0.9 03/16/2021    CT CHEST LUNG CANCER SCREENING LOW DOSE WO CONTRAST  Result Date: 07/09/2020 CLINICAL DATA:  70 year old asymptomatic male former smoker with 22.5 pack-year smoking history, quit smoking 7 years prior. EXAM: CT CHEST WITHOUT CONTRAST LOW-DOSE FOR LUNG CANCER SCREENING TECHNIQUE: Multidetector CT imaging of the chest was performed following the standard protocol without IV contrast. COMPARISON:  None. FINDINGS: Cardiovascular: Normal heart size. No significant pericardial effusion/thickening. Great vessels are normal in course and caliber. Mediastinum/Nodes: No discrete thyroid nodules. Unremarkable esophagus. No pathologically enlarged axillary, mediastinal or hilar lymph nodes, noting limited sensitivity for the detection of hilar adenopathy on this noncontrast study. Lungs/Pleura: No pneumothorax. No pleural effusion. No acute consolidative airspace disease or lung masses. Right lower lobe solid pulmonary nodule measuring 4.7 mm in volume derived mean  diameter (series 3/image 169). No additional significant pulmonary nodules. Upper abdomen: Mild diffuse hepatic steatosis. Simple 1.4 cm inferior right liver cyst. Musculoskeletal: No aggressive appearing focal osseous lesions. Degenerative changes in the visualized cervical spine. IMPRESSION: 1. Lung-RADS 2, benign appearance or behavior. Continue annual screening with low-dose chest CT without contrast in 12 months. 2. Mild diffuse hepatic steatosis. Electronically Signed   By: Ilona Sorrel M.D.   On: 07/09/2020 11:46    Assessment & Plan:   Problem List Items Addressed This Visit       Unprioritized   Essential hypertension    Well controlled on current regimen of hctz , amlodipine and telmisartan.  Renal function stable, no changes today. Advised to reduce hctz dose to 12.5 mg daily if BP trends down and readings are under 110/70 for more than a few days in a row  Lab Results  Component Value Date   CREATININE 0.90 03/16/2021   Lab Results  Component Value Date   NA  139 03/16/2021   K 4.1 03/16/2021   CL 100 03/16/2021   CO2 31 03/16/2021         DM type 2 with diabetic mixed hyperlipidemia ([facility])    BS are at goal on metformin , Januvia , statin .  ASA stopped .  Exercise goals advised to lower weight   Lab Results  Component Value Date   HGBA1C 7.2 (H) 03/16/2021   Lab Results  Component Value Date   LABMICR See below: 08/04/2020   MICROALBUR 0.9 03/16/2021   MICROALBUR <0.7 03/04/2020     Lab Results  Component Value Date   CHOL 136 03/16/2021   HDL 55.60 03/16/2021   LDLCALC 71 03/16/2021   LDLDIRECT 59.0 08/31/2016   TRIG 46.0 03/16/2021   CHOLHDL 2 03/16/2021         Relevant Orders   Hemoglobin A1c   Comprehensive metabolic panel   Lipid panel   Hepatic steatosis    Continue metformin and statin.    Lab Results  Component Value Date   ALT 29 03/16/2021   AST 21 03/16/2021   ALKPHOS 59 03/16/2021   BILITOT 0.7 03/16/2021         Diabetic  nephropathy associated with type 2 diabetes mellitus ([place])    Managed with telmisartan .most recent UA was negative for protein .  Lab Results  Component Value Date   MICROALBUR 0.9 03/16/2021         Prostate cancer screening - Primary   Relevant Orders   PSA, Medicare    I am having Robert Robertson. maintain his Astrid Drafts, Multiple Vitamins-Minerals (CENTRUM SILVER ADULT 50+ PO), blood glucose meter kit and supplies, FreeStyle Freedom Lite, FREESTYLE LITE, metFORMIN, atorvastatin, Januvia, amLODipine, telmisartan, freestyle, and hydrochlorothiazide.  No orders of the defined types were placed in this encounter.   There are no discontinued medications.  Follow-up: No follow-ups on file.   [name], MD

## 2021-03-25 ENCOUNTER — Ambulatory Visit (INDEPENDENT_AMBULATORY_CARE_PROVIDER_SITE_OTHER): Payer: Medicare Other

## 2021-03-25 VITALS — Ht 67.0 in | Wt 192.0 lb

## 2021-03-25 DIAGNOSIS — Z0000 Encounter for general adult medical examination without abnormal findings: Secondary | ICD-10-CM

## 2021-03-25 NOTE — Patient Instructions (Addendum)
  Robert Robertson , Thank you for taking time to come for your Medicare Wellness Visit. I appreciate your ongoing commitment to your health goals. Please review the following plan we discussed and let me know if I can assist you in the future.   These are the goals we discussed:  Goals       Patient Stated     Follow up with Primary Care Provider (pt-stated)      Monitor A1C twice daily          This is a list of the screening recommended for you and due dates:  Health Maintenance  Topic Date Due   Hemoglobin A1C  09/13/2021   Eye exam for diabetics  12/03/2021   Complete foot exam   03/18/2022   Colon Cancer Screening  07/17/2024   Tetanus Vaccine  08/07/2024   Flu Shot  Completed   COVID-19 Vaccine  Completed   Hepatitis C Screening: USPSTF Recommendation to screen - Ages 18-79 yo.  Completed   Zoster (Shingles) Vaccine  Completed   HPV Vaccine  Aged Out

## 2021-03-25 NOTE — Progress Notes (Addendum)
Subjective:   Robert Hicklin. is a 70 y.o. male who presents for Medicare Annual/Subsequent preventive examination.  Review of Systems    No ROS.  Medicare Wellness Virtual Visit.  Visual/audio telehealth visit, UTA vital signs.   See social history for additional risk factors.   Cardiac Risk Factors include: advanced age (>22mn, >>33women);male gender;diabetes mellitus;hypertension     Objective:    Today's Vitals   03/25/21 1532  Weight: 192 lb (87.1 kg)  Height: 5' 7"  (1.702 m)   Body mass index is 30.07 kg/m.  Advanced Directives 03/25/2021 02/06/2020 07/18/2019 02/05/2019 07/19/2018 01/26/2018 12/28/2016  Does Patient Have a Medical Advance Directive? No Yes No Yes No No No  Type of Advance Directive - [place] Does patient want to make changes to medical advance directive? - No - Patient declined - No - Patient declined - - -  [place] of [place]? - No - copy requested - No - copy requested - - -  Would patient like information on creating a medical advance directive? Yes (MAU/Ambulatory/Procedural Areas - Information given) - No - Patient declined - No - Patient declined No - Patient declined Yes (MAU/Ambulatory/Procedural Areas - Information given)    Current Medications (verified) Outpatient Encounter Medications as of 03/25/2021  Medication Sig   amLODipine (NORVASC) 10 MG tablet TAKE 1 TABLET DAILY   atorvastatin (LIPITOR) 80 MG tablet TAKE 1 TABLET DAILY   blood glucose meter kit and supplies USE TO CHECK BLOOD SUGARS UP TO TWO TIMES DAILY   Blood Glucose Monitoring Suppl (FREESTYLE FREEDOM LITE) w/Device KIT 1 application by Does not apply route 2 (two) times daily.   glucose blood (FREESTYLE LITE) test strip USE TO CHECK SUGARS TWICE A DAY   hydrochlorothiazide (HYDRODIURIL) 25 MG tablet TAKE 1 TABLET DAILY   JANUVIA 100 MG tablet TAKE 1 TABLET DAILY   Krill Oil 300 MG CAPS Take  1 capsule by mouth daily.   Lancets (FREESTYLE) lancets USE TO CHECK SUGAR TWICE A DAY   metFORMIN (GLUCOPHAGE) 1000 MG tablet TAKE 1 TABLET TWICE A DAY WITH MEALS   Multiple Vitamins-Minerals (CENTRUM SILVER ADULT 50+ PO) Take 1 tablet by mouth daily.   telmisartan (MICARDIS) 40 MG tablet TAKE 1 TABLET AT BEDTIME   No facility-administered encounter medications on file as of 03/25/2021.    Allergies (verified) Pollen extract   History: Past Medical History:  Diagnosis Date   Allergy    Colon polyp    Diabetes mellitus    Hyperlipidemia    Hypertension    Past Surgical History:  Procedure Laterality Date   COLONOSCOPY  2010   10 mm adenomatous polyp in the descending colon without atypia, DLucilla Lame MD   COLONOSCOPY WITH PROPOFOL N/A 07/18/2019   Procedure: COLONOSCOPY WITH PROPOFOL;  Surgeon: BRobert Bellow MD;  [facility];  Service: Endoscopy;  Laterality: N/A;   Family History  Problem Relation Age of Onset   Diabetes Mother    Stroke Mother    Colon cancer Mother    Diabetes Brother    Diabetes Maternal Grandfather    Diabetes Maternal Grandmother    Cancer Sister        brain tumor   Social History   Socioeconomic History   Marital status: Married    Spouse name: Not on file   Number of children: Not on file   Years of  education: Not on file   Highest education level: Not on file  Occupational History   Not on file  Tobacco Use   Smoking status: Former    Packs/day: 0.50    Years: 45.00    Pack years: 22.50    Types: Cigarettes    Quit date: 2015    Years since quitting: 7.7   Smokeless tobacco: Never  Vaping Use   Vaping Use: Never used  Substance and Sexual Activity   Alcohol use: Yes    Alcohol/week: 14.0 standard drinks    Types: 14 Glasses of wine per week    Comment: daily   Drug use: No   Sexual activity: Not on file  Other Topics Concern   Not on file  Social History Narrative   Not on file   Social Determinants  of Health   Financial Resource Strain: Low Risk    Difficulty of Paying Living Expenses: Not hard at all  Food Insecurity: No Food Insecurity   Worried About Charity fundraiser in the Last Year: Never true   [place] in the Last Year: Never true  Transportation Needs: No Transportation Needs   Lack of Transportation (Medical): No   Lack of Transportation (Non-Medical): No  Physical Activity: Not on file  Stress: No Stress Concern Present   Feeling of Stress : Not at all  Social Connections: Not on file    Tobacco Counseling Counseling given: Not Answered   Clinical Intake:  Pre-visit preparation completed: Yes        Diabetes: Yes (Followed by pcp)  How often do you need to have someone help you when you read instructions, pamphlets, or other written materials from your doctor or pharmacy?: 1 - Never  Diabetes Management: Are you having any financial strains with the device, your supplies or your medication? No .  Does the patient want to be seen by Chronic Care Management for management of their diabetes?  No  Would the patient like to be referred to a Nutritionist or for Diabetic Management?  No      Activities of Daily Living In your present state of health, do you have any difficulty performing the following activities: 03/25/2021  Hearing? N  Vision? N  Difficulty concentrating or making decisions? N  Walking or climbing stairs? N  Dressing or bathing? N  Doing errands, shopping? N  Preparing Food and eating ? N  Using the Toilet? N  In the past six months, have you accidently leaked urine? N  Do you have problems with loss of bowel control? N  Managing your Medications? N  Managing your Finances? N  Housekeeping or managing your Housekeeping? N  Some recent data might be hidden    Patient Care Team: [name], MD as PCP - General (Internal Medicine) [name], MD (Internal Medicine) Bary Castilla Forest Gleason, MD (General  Surgery)  Indicate any recent Medical Services you may have received from other than [facility] providers in the past year (date may be approximate).     Assessment:   This is a routine wellness examination for Robert Robertson.  I connected with Robert Robertson today by telephone and verified that I am speaking with the correct person using two identifiers. Location patient: home Location provider: work Persons participating in the virtual visit: patient, Marine scientist.    I discussed the limitations, risks, security and privacy concerns of performing an evaluation and management service by telephone and the availability of in person appointments. The  patient expressed understanding and verbally consented to this telephonic visit.    Interactive audio and video telecommunications were attempted between this provider and patient, however failed, due to patient having technical difficulties OR patient did not have access to video capability.  We continued and completed visit with audio only.  Some vital signs may be absent or patient reported.   Hearing/Vision screen Hearing Screening - Comments:: Patient is able to hear conversational tones without difficulty.  No issues reported. Vision Screening - Comments:: Wears corrective lenses.  No retinopathy reported.  They have seen their ophthalmologist.  Glaucoma suspect; no drops in use. Visits every 6 months.    Dietary issues and exercise activities discussed: Current Exercise Habits: Home exercise routine, Intensity: Mild  Low carb diet  Good water intake   Goals Addressed               This Visit's Progress     Patient Stated     Follow up with Primary Care Provider (pt-stated)   On track     Monitor A1C twice daily         Depression Screen PHQ 2/9 Scores 03/25/2021 03/18/2021 12/12/2020 09/05/2020 02/06/2020 11/12/2019 02/05/2019  PHQ - 2 Score 0 0 0 0 0 0 0  PHQ- 9 Score - - - 0 - - -    Fall Risk Fall Risk  03/25/2021 03/18/2021 12/12/2020 09/05/2020  06/05/2020  Falls in the past year? 0 0 0 0 0  Comment - - - - -  Number falls in past yr: 0 - 0 - 0  Injury with Fall? - - 0 - 0  Risk for fall due to : - - - - -  Follow up Falls evaluation completed Falls evaluation completed Falls evaluation completed Falls evaluation completed Falls evaluation completed    [place]: Adequate lighting in your home to reduce risk of falls? Yes   ASSISTIVE DEVICES UTILIZED TO PREVENT FALLS: Use of a cane, walker or w/c? No   TIMED UP AND GO: Was the test performed? No .   Cognitive Function: Patient is alert and oriented x3.  MMSE - Mini Mental State Exam 02/06/2020 01/26/2018 12/28/2016  Not completed: Unable to complete - -  Orientation to time - 5 5  Orientation to Place - 5 5  Registration - 3 3  Attention/ Calculation - 5 5  Recall - 3 3  Language- name 2 objects - 2 2  Language- repeat - 1 1  Language- follow 3 step command - 3 3  Language- read & follow direction - 1 1  Write a sentence - 1 1  Copy design - 1 1  Total score - 30 30     6CIT Screen 02/05/2019  What Year? 0 points  What month? 0 points  What time? 0 points  Count back from 20 0 points  Months in reverse 0 points  Repeat phrase 0 points  Total Score 0    Immunizations Immunization History  Administered Date(s) Administered   Fluad Quad(high Dose 65+) 07/03/2019   Hep A / Hep B 05/07/2015, 06/10/2015, 11/04/2015   Influenza Split 04/28/2013   Influenza, High Dose Seasonal PF 02/27/2016, 07/07/2017, 06/08/2018, 03/17/2021   Influenza,inj,Quad PF,6+ Mos 05/08/2014, 05/07/2015   Influenza-Unspecified 03/25/2020   PFIZER(Purple Top)SARS-COV-2 Vaccination 08/09/2019, 09/03/2019, 03/25/2020, 10/17/2020   Pfizer Covid-19 Vaccine Bivalent Booster 64yr & up 03/17/2021   Pneumococcal Conjugate-13 08/15/2015   Pneumococcal Polysaccharide-23 05/08/2014, 07/10/2019   Tdap  08/07/2014   Zoster Recombinat (Shingrix) 09/26/2019, 12/05/2019    Zoster, Live 02/27/2016    Health Maintenance There are no preventive care reminders to display for this patient. Health Maintenance  Topic Date Due   HEMOGLOBIN A1C  09/13/2021   OPHTHALMOLOGY EXAM  12/03/2021   FOOT EXAM  03/18/2022   COLONOSCOPY (Pts 45-31yr Insurance coverage will need to be confirmed)  07/17/2024   TETANUS/TDAP  08/07/2024   INFLUENZA VACCINE  Completed   COVID-19 Vaccine  Completed   Hepatitis C Screening  Completed   Zoster Vaccines- Shingrix  Completed   HPV VACCINES  Aged Out   Lung Cancer Screening: (Low Dose CT Chest recommended if Age 70-80years, 30 pack-year currently smoking OR have quit w/in 15years.) Completed 07/09/20.    Vision Screening: Recommended annual ophthalmology exams for early detection of glaucoma and other disorders of the eye.  Dental Screening: Recommended annual dental exams for proper oral hygiene  Community Resource Referral / Chronic Care Management: CRR required this visit?  No   CCM required this visit?  No      Plan:   Keep all routine maintenance appointments.   I have personally reviewed and noted the following in the patient's chart:   Medical and social history Use of alcohol, tobacco or illicit drugs  Current medications and supplements including opioid prescriptions. Patient is not currently taking opioid prescriptions. Functional ability and status Nutritional status Physical activity Advanced directives List of other physicians Hospitalizations, surgeries, and ER visits in previous 12 months Vitals Screenings to include cognitive, depression, and falls Referrals and appointments  In addition, I have reviewed and discussed with patient certain preventive protocols, quality metrics, and best practice recommendations. A written personalized care plan for preventive services as well as general preventive health recommendations were provided to patient via mychart.     OBrien-Blaney, Eluterio Seymour L,  LPN   [id number]     I have reviewed the above information and agree with above.   TDeborra Medina MD

## 2021-05-12 ENCOUNTER — Other Ambulatory Visit: Payer: Self-pay | Admitting: Internal Medicine

## 2021-06-06 ENCOUNTER — Other Ambulatory Visit: Payer: Self-pay | Admitting: Internal Medicine

## 2021-07-07 ENCOUNTER — Telehealth: Payer: Self-pay | Admitting: Internal Medicine

## 2021-07-07 MED ORDER — FREESTYLE LITE TEST VI STRP: ORAL_STRIP | 1 refills | Status: DC

## 2021-07-07 NOTE — Telephone Encounter (Signed)
Rx has been refilled.  

## 2021-07-07 NOTE — Telephone Encounter (Signed)
Pt want a refill on freestyle lite test strips sent to [facility] in [place] on [address]

## 2021-08-05 ENCOUNTER — Other Ambulatory Visit: Payer: Self-pay

## 2021-08-05 ENCOUNTER — Encounter: Payer: Self-pay | Admitting: Urology

## 2021-08-05 ENCOUNTER — Ambulatory Visit (INDEPENDENT_AMBULATORY_CARE_PROVIDER_SITE_OTHER): Payer: Medicare Other | Admitting: Urology

## 2021-08-05 VITALS — BP 146/87 | HR 107 | Ht 68.0 in | Wt 195.0 lb

## 2021-08-05 DIAGNOSIS — N400 Enlarged prostate without lower urinary tract symptoms: Secondary | ICD-10-CM | POA: Diagnosis not present

## 2021-08-05 DIAGNOSIS — Z125 Encounter for screening for malignant neoplasm of prostate: Secondary | ICD-10-CM | POA: Diagnosis not present

## 2021-08-05 LAB — URINALYSIS, COMPLETE
Bilirubin, UA: NEGATIVE
Glucose, UA: NEGATIVE
Ketones, UA: NEGATIVE
Leukocytes,UA: NEGATIVE
Nitrite, UA: NEGATIVE
Protein,UA: NEGATIVE
RBC, UA: NEGATIVE
Specific Gravity, UA: 1.025 (ref 1.005–1.030)
Urobilinogen, Ur: 1 mg/dL (ref 0.2–1.0)
pH, UA: 6 (ref 5.0–7.5)

## 2021-08-05 LAB — MICROSCOPIC EXAMINATION: Bacteria, UA: NONE SEEN

## 2021-08-05 LAB — BLADDER SCAN AMB NON-IMAGING: Scan Result: 0

## 2021-08-05 NOTE — Progress Notes (Signed)
08/05/2021 10:50 AM   Robert Robertson. 1951/03/12 [id number]  Referring provider: [name], MD [place],  [place] [address]  Chief Complaint  Patient presents with   Benign Prostatic Hypertrophy    HPI: 71 y.o. male presents for annual follow-up.  Doing well since last visit No bothersome LUTS Denies dysuria, gross hematuria Denies flank, abdominal or pelvic pain   PMH: Past Medical History:  Diagnosis Date   Allergy    Colon polyp    Diabetes mellitus    Hyperlipidemia    Hypertension     Surgical History: Past Surgical History:  Procedure Laterality Date   COLONOSCOPY  2010   10 mm adenomatous polyp in the descending colon without atypia, [name], MD   COLONOSCOPY WITH PROPOFOL N/A 07/18/2019   Procedure: COLONOSCOPY WITH PROPOFOL;  Surgeon: Robert Bellow, MD;  Location: [facility];  Service: Endoscopy;  Laterality: N/A;    Home Medications:  Allergies as of 08/05/2021       Reactions   Pollen Extract Other (See Comments)   Hay-fever. Reaction: Sneezing        Medication List        Accurate as of August 05, 2021 10:50 AM. If you have any questions, ask your nurse or doctor.          amLODipine 10 MG tablet Commonly known as: NORVASC TAKE 1 TABLET DAILY   atorvastatin 80 MG tablet Commonly known as: LIPITOR TAKE 1 TABLET DAILY   blood glucose meter kit and supplies USE TO CHECK BLOOD SUGARS UP TO TWO TIMES DAILY   CENTRUM SILVER ADULT 50+ PO Take 1 tablet by mouth daily.   FreeStyle Freedom Lite w/Device Kit 1 application by Does not apply route 2 (two) times daily.   freestyle lancets USE TO CHECK SUGAR TWICE A DAY   FREESTYLE LITE test strip Generic drug: glucose blood USE TO CHECK SUGARS TWICE A DAY   hydrochlorothiazide 25 MG tablet Commonly known as: HYDRODIURIL TAKE 1 TABLET DAILY   Januvia 100 MG tablet Generic drug: sitaGLIPtin TAKE 1 TABLET DAILY   Krill Oil 300  MG Caps Take 1 capsule by mouth daily.   metFORMIN 1000 MG tablet Commonly known as: GLUCOPHAGE TAKE 1 TABLET TWICE A DAY WITH MEALS   telmisartan 40 MG tablet Commonly known as: MICARDIS TAKE 1 TABLET AT BEDTIME        Allergies:  Allergies  Allergen Reactions   Pollen Extract Other (See Comments)    Hay-fever. Reaction: Sneezing    Family History: Family History  Problem Relation Age of Onset   Diabetes Mother    Stroke Mother    Colon cancer Mother    Diabetes Brother    Diabetes Maternal Grandfather    Diabetes Maternal Grandmother    Cancer Sister        brain tumor    Social History:  reports that he quit smoking about 8 years ago. His smoking use included cigarettes. He has a 22.50 pack-year smoking history. He has never used smokeless tobacco. He reports current alcohol use of about 14.0 standard drinks per week. He reports that he does not use drugs.   Physical Exam: BP (!) 146/87    Pulse (!) 107    Ht 5' 8"  (1.727 m)    Wt 195 lb (88.5 kg)    BMI 29.65 kg/m   Constitutional:  Alert and oriented, No acute distress. HEENT: [place] AT, moist mucus membranes.  Trachea midline, no masses. Cardiovascular: No clubbing, cyanosis, or edema. Respiratory: Normal respiratory effort, no increased work of breathing. Psychiatric: Normal mood and affect.   Assessment & Plan:    1. BPH without obstruction/lower urinary tract symptoms No bothersome LUTS Bladder scan PVR 0 mL  2.  Prostate cancer screening We discussed current recommendations for prostate cancer screening are PSA/DRE between the ages of 33-69.  Healthy patients can continue to 15.  He desires to have his PSA checked and will call with results.  He desires to continue annual follow-up.   Abbie Sons, [place] [address], [place], [place] [address] [phone]

## 2021-08-06 LAB — PSA: Prostate Specific Ag, Serum: 2.7 ng/mL (ref 0.0–4.0)

## 2021-08-07 ENCOUNTER — Encounter: Payer: Self-pay | Admitting: Urology

## 2021-08-11 ENCOUNTER — Other Ambulatory Visit: Payer: Self-pay | Admitting: Internal Medicine

## 2021-08-18 ENCOUNTER — Other Ambulatory Visit: Payer: Self-pay | Admitting: Internal Medicine

## 2021-08-21 ENCOUNTER — Other Ambulatory Visit: Payer: Self-pay | Admitting: *Deleted

## 2021-08-21 DIAGNOSIS — Z87891 Personal history of nicotine dependence: Secondary | ICD-10-CM

## 2021-09-04 ENCOUNTER — Other Ambulatory Visit: Payer: Self-pay

## 2021-09-04 ENCOUNTER — Ambulatory Visit
Admission: RE | Admit: 2021-09-04 | Discharge: 2021-09-04 | Disposition: A | Discharge status: Home / Self Care | Payer: Medicare Other | Source: Ambulatory Visit | Attending: Acute Care | Admitting: Acute Care

## 2021-09-04 DIAGNOSIS — Z87891 Personal history of nicotine dependence: Secondary | ICD-10-CM | POA: Diagnosis not present

## 2021-09-04 IMAGING — CT CT CHEST LUNG CANCER SCREENING LOW DOSE W/O CM
2 of 5 series · 15 of 40 positions shown, 18 images · non-contrast
Comparison: Low-dose lung cancer screening chest CT [DATE].

CLINICAL DATA: 70-year-old male former smoker (quit in [8I]) with
23 pack-year history of smoking. Lung cancer screening examination.



[Series 3: lung 1.00 · axial · 0.70mm/px · z∈[-1182,-887]mm · 12 of 325 slices shown, 15 images]
[im 15/325  mediastinal]
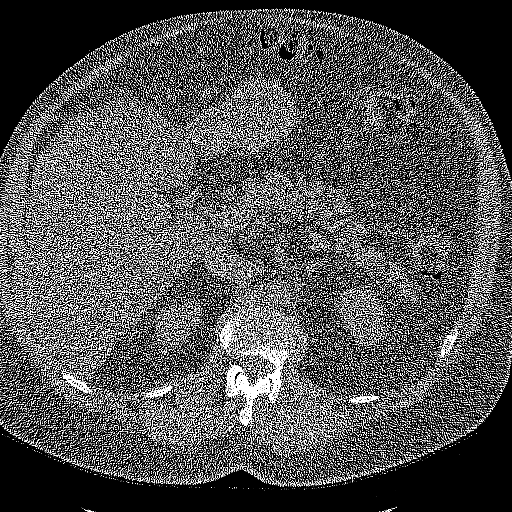
[im 15/325  lung]
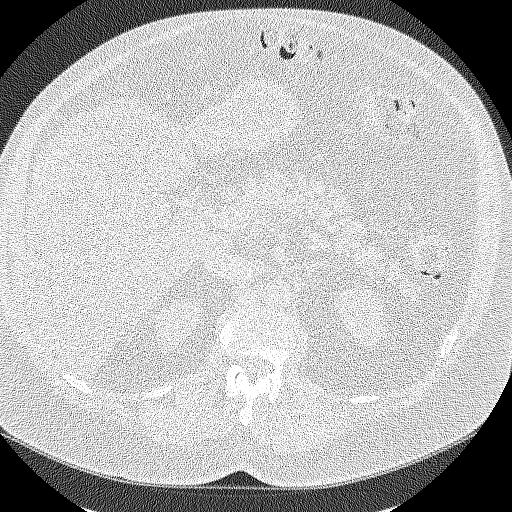
[im 45/325  lung]
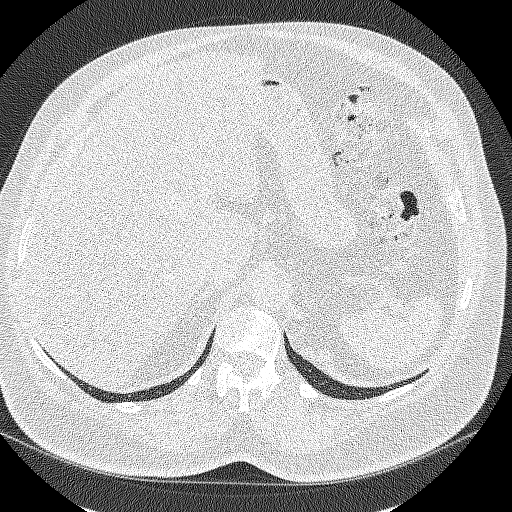
[im 74/325  lung]
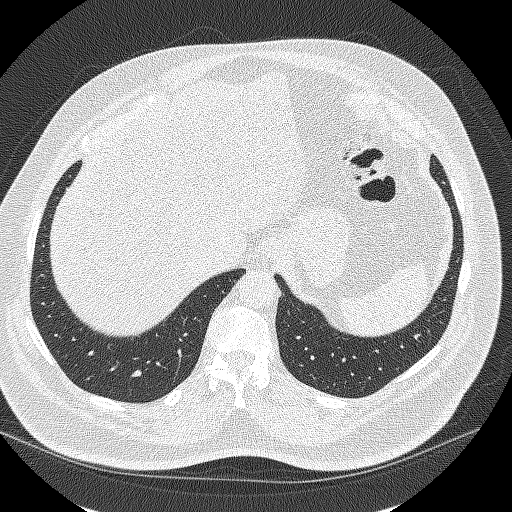
[im 104/325  lung]
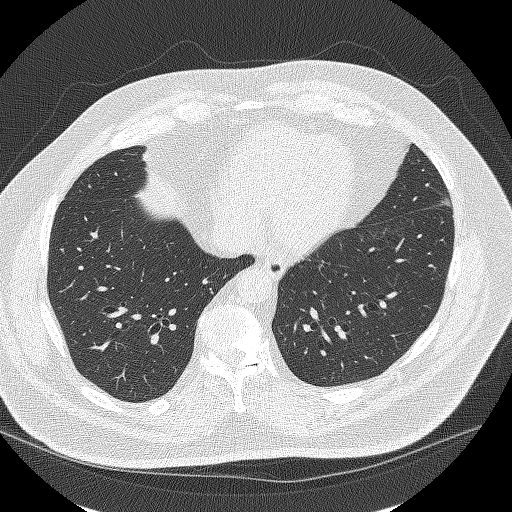
[im 118/325  mediastinal]
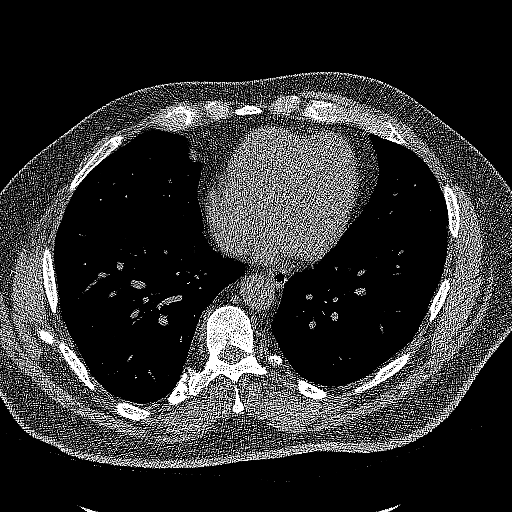
[im 118/325  lung]
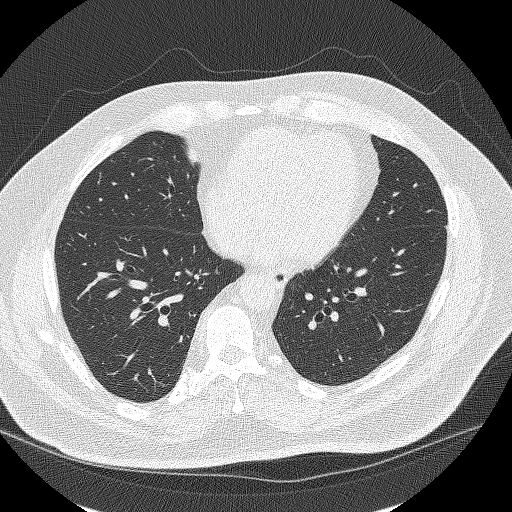
[im 148/325  lung]
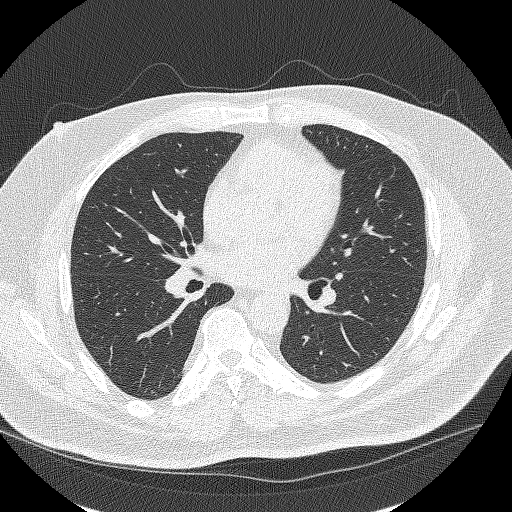
[im 177/325  lung]
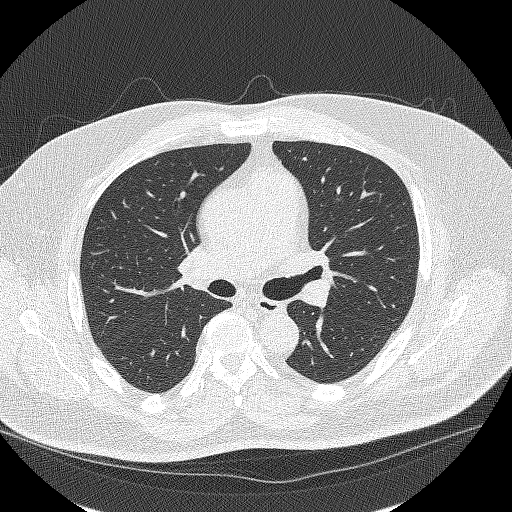
[im 207/325  lung]
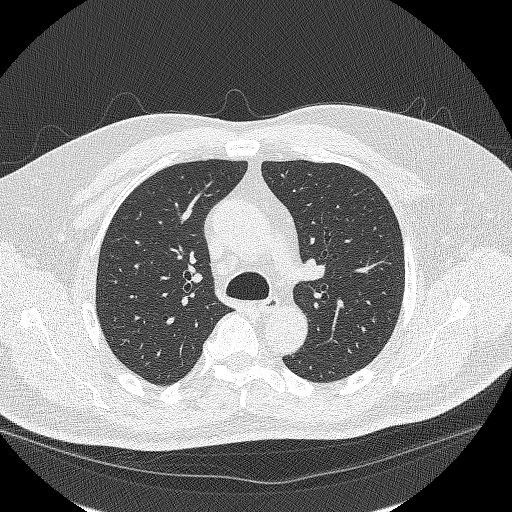
[im 221/325  mediastinal]
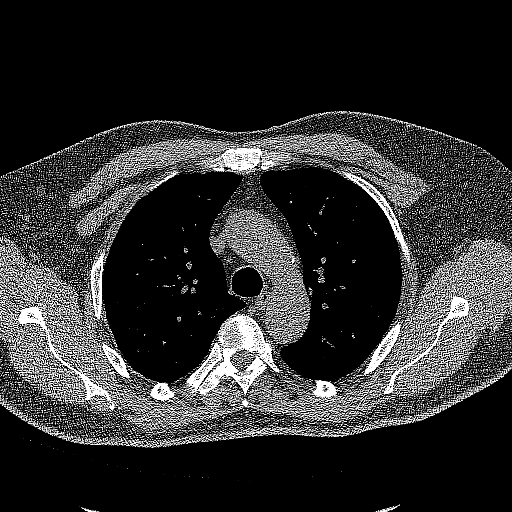
[im 221/325  lung]
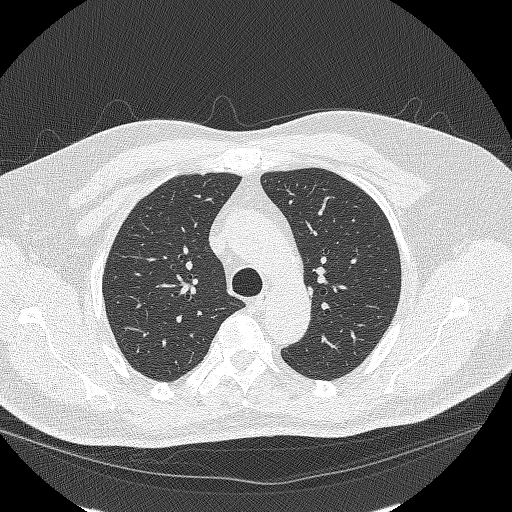
[im 251/325  lung]
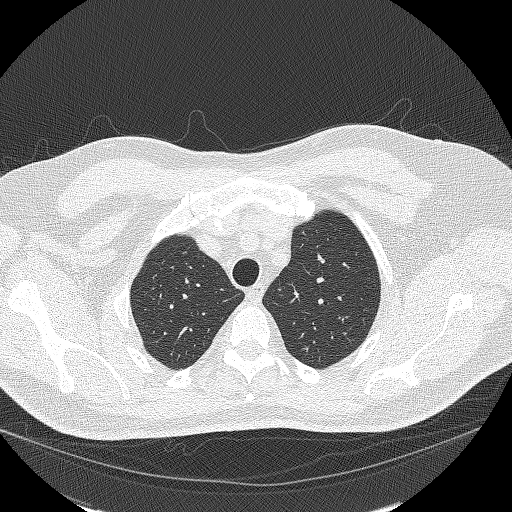
[im 280/325  lung]
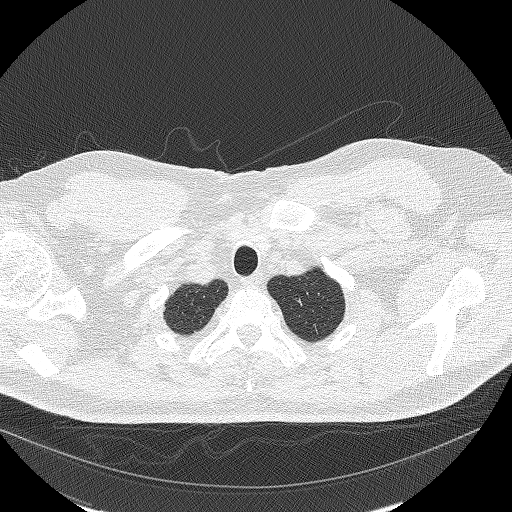
[im 310/325  lung]
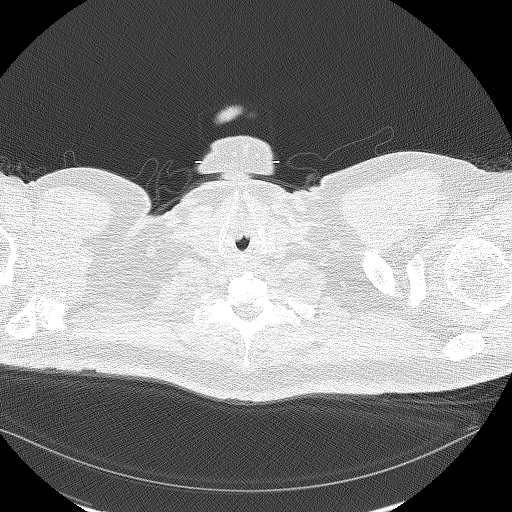

[Series 5: coronals lung 1.00 cor · coronal · 0.64mm/px · 3 of 356 slices shown]
[im 72/356  lung]
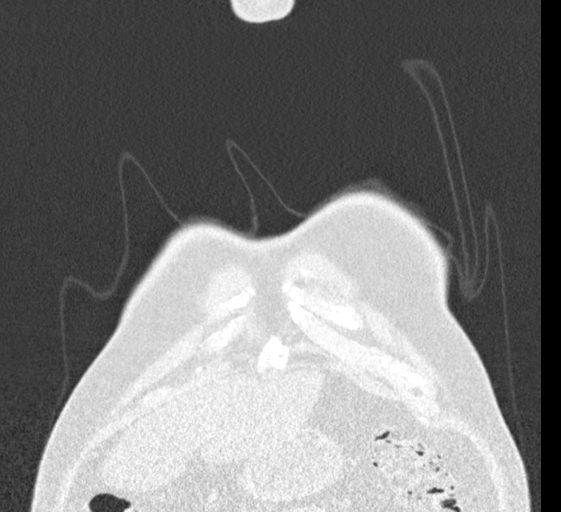
[im 143/356  lung]
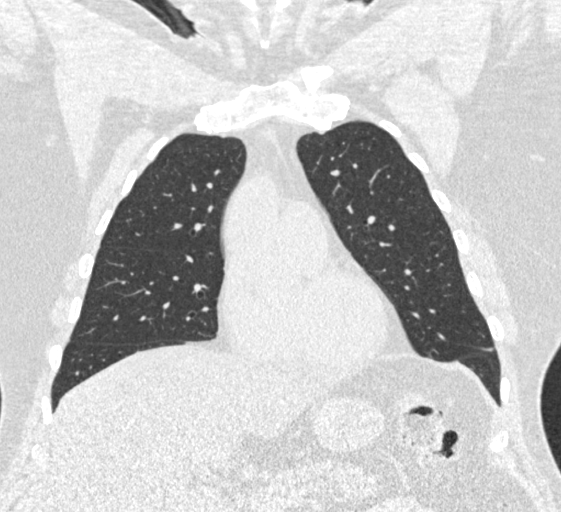
[im 214/356  lung]
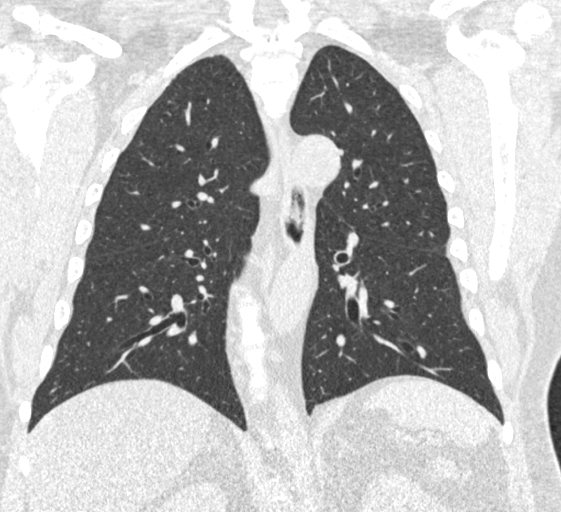

[15 of 40 positions shown; findings below may reference images not displayed]

FINDINGS: Cardiovascular: Heart size is normal. There is no significant
pericardial fluid, thickening or pericardial calcification. Aortic
atherosclerosis. No definite coronary artery calcifications.

Mediastinum/Nodes: No pathologically enlarged mediastinal or hilar
lymph nodes. Please note that accurate exclusion of hilar adenopathy
is limited on noncontrast CT scans. Esophagus is unremarkable in
appearance. No axillary lymphadenopathy.

Lungs/Pleura: Small pulmonary nodule in the right lower lobe (axial
image 161 of series 3), with a volume derived mean diameter of only
5.0 mm. No larger more suspicious appearing pulmonary nodules or
masses are noted. No acute consolidative airspace disease. No
pleural effusions. Mild diffuse bronchial wall thickening with very
mild centrilobular and paraseptal emphysema.

Upper Abdomen: Severe diffuse low attenuation throughout the
visualized hepatic parenchyma. Colonic diverticulosis noted in the
visualized portions of the colon. Aortic atherosclerosis.

Musculoskeletal: There are no aggressive appearing lytic or blastic
lesions noted in the visualized portions of the skeleton.
IMPRESSION: 1. Lung-RADS 2, benign appearance or behavior. Continue annual
screening with low-dose chest CT without contrast in 12 months.
2. Aortic atherosclerosis.
3. Mild diffuse bronchial wall thickening with very mild
centrilobular and paraseptal emphysema; imaging findings suggestive
of underlying COPD.
4. Severe hepatic steatosis.
5. Colonic diverticulosis.

Aortic Atherosclerosis ([8I]-[8I]) and Emphysema ([8I]-[8I]).

## 2021-09-07 ENCOUNTER — Other Ambulatory Visit: Payer: Self-pay | Admitting: Acute Care

## 2021-09-07 DIAGNOSIS — Z87891 Personal history of nicotine dependence: Secondary | ICD-10-CM

## 2021-09-11 ENCOUNTER — Other Ambulatory Visit (INDEPENDENT_AMBULATORY_CARE_PROVIDER_SITE_OTHER): Payer: Medicare Other

## 2021-09-11 ENCOUNTER — Other Ambulatory Visit: Payer: Self-pay

## 2021-09-11 DIAGNOSIS — Z125 Encounter for screening for malignant neoplasm of prostate: Secondary | ICD-10-CM | POA: Diagnosis not present

## 2021-09-11 DIAGNOSIS — E782 Mixed hyperlipidemia: Secondary | ICD-10-CM

## 2021-09-11 DIAGNOSIS — E1169 Type 2 diabetes mellitus with other specified complication: Secondary | ICD-10-CM

## 2021-09-11 LAB — COMPREHENSIVE METABOLIC PANEL
ALT: 37 U/L (ref 0–53)
AST: 24 U/L (ref 0–37)
Albumin: 4.4 g/dL (ref 3.5–5.2)
Alkaline Phosphatase: 55 U/L (ref 39–117)
BUN: 14 mg/dL (ref 6–23)
CO2: 29 mEq/L (ref 19–32)
Calcium: 9.6 mg/dL (ref 8.4–10.5)
Chloride: 99 mEq/L (ref 96–112)
Creatinine, Ser: 0.87 mg/dL (ref 0.40–1.50)
GFR: 87.23 mL/min (ref >60.00)
Glucose, Bld: 136 mg/dL — ABNORMAL HIGH (ref 70–99)
Potassium: 3.8 mEq/L (ref 3.5–5.1)
Sodium: 138 mEq/L (ref 135–145)
Total Bilirubin: 0.7 mg/dL (ref 0.2–1.2)
Total Protein: 6.9 g/dL (ref 6.0–8.3)

## 2021-09-11 LAB — LIPID PANEL
Cholesterol: 125 mg/dL (ref 0–200)
HDL: 57.8 mg/dL
LDL Cholesterol: 53 mg/dL (ref 0–99)
NonHDL: 67.3
Total CHOL/HDL Ratio: 2
Triglycerides: 72 mg/dL (ref 0.0–149.0)
VLDL: 14.4 mg/dL (ref 0.0–40.0)

## 2021-09-11 LAB — PSA, MEDICARE: PSA: 2.43 ng/ml (ref 0.10–4.00)

## 2021-09-11 LAB — HEMOGLOBIN A1C: Hgb A1c MFr Bld: 7.4 % — ABNORMAL HIGH (ref 4.6–6.5)

## 2021-09-15 ENCOUNTER — Encounter: Payer: Self-pay | Admitting: Internal Medicine

## 2021-09-16 ENCOUNTER — Other Ambulatory Visit: Payer: Self-pay

## 2021-09-16 ENCOUNTER — Telehealth: Payer: Self-pay | Admitting: Internal Medicine

## 2021-09-16 ENCOUNTER — Ambulatory Visit (INDEPENDENT_AMBULATORY_CARE_PROVIDER_SITE_OTHER): Payer: Medicare Other | Admitting: Internal Medicine

## 2021-09-16 ENCOUNTER — Encounter: Payer: Self-pay | Admitting: Internal Medicine

## 2021-09-16 DIAGNOSIS — E782 Mixed hyperlipidemia: Secondary | ICD-10-CM | POA: Diagnosis not present

## 2021-09-16 DIAGNOSIS — K760 Fatty (change of) liver, not elsewhere classified: Secondary | ICD-10-CM

## 2021-09-16 DIAGNOSIS — D126 Benign neoplasm of colon, unspecified: Secondary | ICD-10-CM | POA: Diagnosis not present

## 2021-09-16 DIAGNOSIS — R911 Solitary pulmonary nodule: Secondary | ICD-10-CM

## 2021-09-16 DIAGNOSIS — E1169 Type 2 diabetes mellitus with other specified complication: Secondary | ICD-10-CM

## 2021-09-16 DIAGNOSIS — J439 Emphysema, unspecified: Secondary | ICD-10-CM

## 2021-09-16 DIAGNOSIS — E1121 Type 2 diabetes mellitus with diabetic nephropathy: Secondary | ICD-10-CM

## 2021-09-16 DIAGNOSIS — I10 Essential (primary) hypertension: Secondary | ICD-10-CM

## 2021-09-16 MED ORDER — TELMISARTAN-AMLODIPINE 40-10 MG PO TABS: 1.0000 | ORAL_TABLET | Freq: Every day | ORAL | 1 refills | Status: DC

## 2021-09-16 NOTE — Assessment & Plan Note (Signed)
None found on 2021 colonoscopy.  5 yr follow up advised in 2026 ?

## 2021-09-16 NOTE — Progress Notes (Signed)
? ?Subjective:  ?Patient ID: Robert Lat., male    DOB: August 09, 1950  Age: 71 y.o. MRN: [id number] ? ?CC: Diagnoses of DM type 2 with diabetic mixed hyperlipidemia ([place]), Diabetic nephropathy associated with type 2 diabetes mellitus ([place]), Hepatic steatosis, Tubular adenoma of colon, Essential hypertension, Pulmonary emphysema, unspecified emphysema type ([place]), and Pulmonary nodule, right were pertinent to this visit. ? ? ?This visit occurred during the SARS-CoV-2 public health emergency.  Safety protocols were in place, including screening questions prior to the visit, additional usage of staff PPE, and extensive cleaning of exam room while observing appropriate contact time as indicated for disinfecting solutions.   ? ?HPI ?Robert Lat. presents for  ?Chief Complaint  ?Patient presents with  ? Follow-up  ?  6 month follow up on diabetes  ? ?1) T2 DM  FASTINGS HAVE BEEN SLOWLY RISING AND LAST 30 DAYS AVERAGE WAS [place] AND CONSIDERING ADDING JARDIANCE TO [place]  ? ? ?2) HTN:  Patient is taking his medications as prescribed and notes no adverse effects.  Home BP readings have been done daily and are for the majority  < 130/80 .  he is avoiding added salt in her diet and walking regularly about 5 times per week for exercise.  Taking hctz amlodipine and telmisartan.  Discussed combinign amlodipine and telmisartan  ? ?3)  CT CHEST REVIEWED:  FATTY LIVER  severe:  reviewed ETOH DISCUSSE  2 SOMETIMeS 3 BEERS PER NIGHT. ADVISED TO REDUCE TO 0-1 PER NIGHT   ? ? ?Outpatient Medications Prior to Visit  ?Medication Sig Dispense Refill  ? atorvastatin (LIPITOR) 80 MG tablet TAKE 1 TABLET DAILY 90 tablet 3  ? blood glucose meter kit and supplies USE TO CHECK BLOOD SUGARS UP TO TWO TIMES DAILY 1 each 0  ? Blood Glucose Monitoring Suppl (FREESTYLE FREEDOM LITE) w/Device KIT 1 application by Does not apply route 2 (two) times daily. 1 kit 0  ? glucose blood (FREESTYLE  LITE) test strip USE TO CHECK SUGARS TWICE A DAY 200 each 1  ? hydrochlorothiazide (HYDRODIURIL) 25 MG tablet TAKE 1 TABLET DAILY 90 tablet 3  ? JANUVIA 100 MG tablet TAKE 1 TABLET DAILY 90 tablet 3  ? Krill Oil 300 MG CAPS Take 1 capsule by mouth daily.    ? Lancets (FREESTYLE) lancets USE TO CHECK SUGAR TWICE A DAY 200 each 3  ? metFORMIN (GLUCOPHAGE) 1000 MG tablet TAKE 1 TABLET TWICE A DAY WITH MEALS 180 tablet 3  ? Multiple Vitamins-Minerals (CENTRUM SILVER ADULT 50+ PO) Take 1 tablet by mouth daily.    ? amLODipine (NORVASC) 10 MG tablet TAKE 1 TABLET DAILY 90 tablet 3  ? telmisartan (MICARDIS) 40 MG tablet TAKE 1 TABLET AT BEDTIME 90 tablet 3  ? ?No facility-administered medications prior to visit.  ? ? ?Review of Systems; ? ?Patient denies headache, fevers, malaise, unintentional weight loss, skin rash, eye pain, sinus congestion and sinus pain, sore throat, dysphagia,  hemoptysis , cough, dyspnea, wheezing, chest pain, palpitations, orthopnea, edema, abdominal pain, nausea, melena, diarrhea, constipation, flank pain, dysuria, hematuria, urinary  Frequency, nocturia, numbness, tingling, seizures,  Focal weakness, Loss of consciousness,  Tremor, insomnia, depression, anxiety, and suicidal ideation.   ? ? ? ?Objective:  ?BP 126/72 (BP Location: Left Arm, Patient Position: Sitting, Cuff Size: Normal)   Pulse 90   Temp (!) 97.5 ?F (36.4 ?C) (Oral)   Ht $R'5\' 8"'iw$  (1.727 m)   Wt 190  lb 6.4 oz (86.4 kg)   SpO2 97%   BMI 28.95 kg/m?  ? ?BP Readings from Last 3 Encounters:  ?09/16/21 126/72  ?08/05/21 (!) 146/87  ?03/18/21 126/74  ? ? ?Wt Readings from Last 3 Encounters:  ?09/16/21 190 lb 6.4 oz (86.4 kg)  ?09/04/21 190 lb (86.2 kg)  ?08/05/21 195 lb (88.5 kg)  ? ? ?General appearance: alert, cooperative and appears stated age ?Ears: normal TM's and external ear canals both ears ?Throat: lips, mucosa, and tongue normal; teeth and gums normal ?Neck: no adenopathy, no carotid bruit, supple, symmetrical, trachea  midline and thyroid not enlarged, symmetric, no tenderness/mass/nodules ?Back: symmetric, no curvature. ROM normal. No CVA tenderness. ?Lungs: clear to auscultation bilaterally ?Heart: regular rate and rhythm, S1, S2 normal, no murmur, click, rub or gallop ?Abdomen: soft, non-tender; bowel sounds normal; no masses,  no organomegaly ?Pulses: 2+ and symmetric ?Skin: Skin color, texture, turgor normal. No rashes or lesions ?Lymph nodes: Cervical, supraclavicular, and axillary nodes normal. ? ?Lab Results  ?Component Value Date  ? HGBA1C 7.4 (H) 09/11/2021  ? HGBA1C 7.2 (H) 03/16/2021  ? HGBA1C 7.3 (H) 12/08/2020  ? ? ?Lab Results  ?Component Value Date  ? CREATININE 0.87 09/11/2021  ? CREATININE 0.90 03/16/2021  ? CREATININE 0.86 12/08/2020  ? ? ?Lab Results  ?Component Value Date  ? WBC 5.4 06/07/2018  ? HGB 14.2 06/07/2018  ? HCT 43.6 06/07/2018  ? PLT 170.0 06/07/2018  ? GLUCOSE 136 (H) 09/11/2021  ? CHOL 125 09/11/2021  ? TRIG 72.0 09/11/2021  ? HDL 57.80 09/11/2021  ? LDLDIRECT 59.0 08/31/2016  ? LDLCALC 53 09/11/2021  ? ALT 37 09/11/2021  ? AST 24 09/11/2021  ? NA 138 09/11/2021  ? K 3.8 09/11/2021  ? CL 99 09/11/2021  ? CREATININE 0.87 09/11/2021  ? BUN 14 09/11/2021  ? CO2 29 09/11/2021  ? PSA 2.43 09/11/2021  ? INR 1.1 (H) 10/09/2018  ? HGBA1C 7.4 (H) 09/11/2021  ? MICROALBUR 0.9 03/16/2021  ? ? ?CT CHEST LUNG CA SCREEN LOW DOSE W/O CM ? ?Result Date: 09/05/2021 ?CLINICAL DATA:  71 year old male former smoker (quit in 2015) with 23 pack-year history of smoking. Lung cancer screening examination. EXAM: CT CHEST WITHOUT CONTRAST LOW-DOSE FOR LUNG CANCER SCREENING TECHNIQUE: Multidetector CT imaging of the chest was performed following the standard protocol without IV contrast. RADIATION DOSE REDUCTION: This exam was performed according to the departmental dose-optimization program which includes automated exposure control, adjustment of the mA and/or kV according to patient size and/or use of iterative  reconstruction technique. COMPARISON:  Low-dose lung cancer screening chest CT 07/09/2020. FINDINGS: Cardiovascular: Heart size is normal. There is no significant pericardial fluid, thickening or pericardial calcification. Aortic atherosclerosis. No definite coronary artery calcifications. Mediastinum/Nodes: No pathologically enlarged mediastinal or hilar lymph nodes. Please note that accurate exclusion of hilar adenopathy is limited on noncontrast CT scans. Esophagus is unremarkable in appearance. No axillary lymphadenopathy. Lungs/Pleura: Small pulmonary nodule in the right lower lobe (axial image 161 of series 3), with a volume derived mean diameter of only 5.0 mm. No larger more suspicious appearing pulmonary nodules or masses are noted. No acute consolidative airspace disease. No pleural effusions. Mild diffuse bronchial wall thickening with very mild centrilobular and paraseptal emphysema. Upper Abdomen: Severe diffuse low attenuation throughout the visualized hepatic parenchyma. Colonic diverticulosis noted in the visualized portions of the colon. Aortic atherosclerosis. Musculoskeletal: There are no aggressive appearing lytic or blastic lesions noted in the visualized portions of the skeleton. IMPRESSION: 1.  Lung-RADS 2, benign appearance or behavior. Continue annual screening with low-dose chest CT without contrast in 12 months. 2. Aortic atherosclerosis. 3. Mild diffuse bronchial wall thickening with very mild centrilobular and paraseptal emphysema; imaging findings suggestive of underlying COPD. 4. Severe hepatic steatosis. 5. Colonic diverticulosis. Aortic Atherosclerosis (ICD10-I70.0) and Emphysema (ICD10-J43.9). Electronically Signed   By: Vinnie Langton M.D.   On: 09/05/2021 06:24 ? ? ?Assessment & Plan:  ? ?Problem List Items Addressed This Visit   ? ? Diabetic nephropathy associated with type 2 diabetes mellitus ([place])  ?  Managed with telmisartan .most recent UA was negative for protein . ? ?Lab  Results  ?Component Value Date  ? MICROALBUR 0.9 03/16/2021  ? ? ?  ?  ? Relevant Medications  ? Telmisartan-amLODIPine 40-10 MG TABS  ? DM type 2 with diabetic mixed hyperlipidemia ([place])  ?  BS are at goal on metformin ,

## 2021-09-16 NOTE — Telephone Encounter (Signed)
Labs have been ordered and pt has been scheduled for a lab appt. Pt is aware of appt date and time.  

## 2021-09-16 NOTE — Patient Instructions (Addendum)
HYPERTENSION: ? ?BLOOD PRESSURE IS FINE.  WE ARE REPLACING AMLODIPINE AND TELMISARTAN WITH A COMBINATION PILL TO SAVE YOU A COPAY  IT HAS BEEN   SENT TO MAIL ORDER ? ?DO NOT START UNTIL IT YOU ARE OUT OF ONE OF THE TWO INDIVIDUAL MEDICATIONS.  THEN STOP BOTH SINGLE MEDICATIONS   WHEN YOU START THE COMBINATION  ? ? ?DIABETES:  ? ?CHECK 2 HOURS POST PRANDIAL SUGARS FOR THE  NEXT 2 WEEKS INSTEAD OF FASTING AND SEND ME THOSE READINGS  ? ?DIVERTICULOSIS:   ?  ?TRY ADDING ONE SERVING OF BENEFIBER  DAILY TO ADD FIBER FOR YOUR COLON  ? ? ?FATTY LIVER :  ? ?CUT BACK TO ONE BEER DAILY (OR NONE ON A DAILY BASIS ) BECAUSE YOUR LIVER IS VERY FATTY  ? ? ? ? ? ?

## 2021-09-16 NOTE — Assessment & Plan Note (Signed)
Stable by annual low dose CT done every January ?

## 2021-09-16 NOTE — Assessment & Plan Note (Addendum)
Well controlled on current regimen. Renal function stable, no changes today.  Combining amlodipine  and telmisartan into one pill,  And continue hctz  ? ?Lab Results  ?Component Value Date  ? CREATININE 0.87 09/11/2021  ? ?. ?Lab Results  ?Component Value Date  ? NA 138 09/11/2021  ? K 3.8 09/11/2021  ? CL 99 09/11/2021  ? CO2 29 09/11/2021  ? ? ?

## 2021-09-16 NOTE — Assessment & Plan Note (Signed)
BS are at goal on metformin , Januvia , statin .  ASA stopped .  Discussed starting Jardiance once post prandial sugars can  be reviewed  ? ?Lab Results  ?Component Value Date  ? HGBA1C 7.4 (H) 09/11/2021  ? ?Lab Results  ?Component Value Date  ? LABMICR See below: 08/05/2021  ? LABMICR See below: 08/04/2020  ? MICROALBUR 0.9 03/16/2021  ? MICROALBUR <0.7 03/04/2020  ? ? ? ?Lab Results  ?Component Value Date  ? CHOL 125 09/11/2021  ? HDL 57.80 09/11/2021  ? LDLCALC 53 09/11/2021  ? LDLDIRECT 59.0 08/31/2016  ? TRIG 72.0 09/11/2021  ? CHOLHDL 2 09/11/2021  ? ? ?

## 2021-09-16 NOTE — Assessment & Plan Note (Addendum)
Severe, by recent CT ,  With normal liver enzymes.  Advised to reduce alcohol intake .  He is vaccinated against Hep A and  B ?

## 2021-09-16 NOTE — Assessment & Plan Note (Signed)
Managed with telmisartan .most recent UA was negative for protein . ? ?Lab Results  ?Component Value Date  ? MICROALBUR 0.9 03/16/2021  ? ? ?

## 2021-09-16 NOTE — Assessment & Plan Note (Signed)
Centrilobular and paraseptal, mild by chest CT.  Secondary to tobacco abuse.   He remains asymptomatic with daily exercise.   

## 2021-09-16 NOTE — Telephone Encounter (Signed)
Pt is requesting lab for 6 Months follow... No labs have been placed... Pt requesting callback...  ?

## 2021-10-06 ENCOUNTER — Observation Stay: Admit: 2021-10-06 | Payer: Medicare PPO

## 2021-10-06 ENCOUNTER — Observation Stay: Payer: Medicare PPO

## 2021-10-06 ENCOUNTER — Observation Stay
Admit: 2021-10-06 | Discharge: 2021-10-06 | Disposition: A | Discharge status: Home / Self Care | Payer: Medicare PPO | Attending: Neurology | Admitting: Neurology

## 2021-10-06 ENCOUNTER — Emergency Department: Payer: Medicare PPO

## 2021-10-06 ENCOUNTER — Encounter: Payer: Self-pay | Admitting: Internal Medicine

## 2021-10-06 ENCOUNTER — Observation Stay
Admission: EM | Admit: 2021-10-06 | Discharge: 2021-10-07 | Disposition: A | Discharge status: Home / Self Care | Payer: Medicare PPO | Attending: Internal Medicine | Admitting: Internal Medicine

## 2021-10-06 DIAGNOSIS — Z7984 Long term (current) use of oral hypoglycemic drugs: Secondary | ICD-10-CM | POA: Insufficient documentation

## 2021-10-06 DIAGNOSIS — I639 Cerebral infarction, unspecified: Secondary | ICD-10-CM | POA: Diagnosis not present

## 2021-10-06 DIAGNOSIS — E785 Hyperlipidemia, unspecified: Secondary | ICD-10-CM | POA: Diagnosis present

## 2021-10-06 DIAGNOSIS — R202 Paresthesia of skin: Secondary | ICD-10-CM

## 2021-10-06 DIAGNOSIS — Z79899 Other long term (current) drug therapy: Secondary | ICD-10-CM | POA: Diagnosis not present

## 2021-10-06 DIAGNOSIS — Z87898 Personal history of other specified conditions: Secondary | ICD-10-CM | POA: Diagnosis present

## 2021-10-06 DIAGNOSIS — E1169 Type 2 diabetes mellitus with other specified complication: Secondary | ICD-10-CM | POA: Diagnosis not present

## 2021-10-06 DIAGNOSIS — Z87891 Personal history of nicotine dependence: Secondary | ICD-10-CM | POA: Diagnosis not present

## 2021-10-06 DIAGNOSIS — G459 Transient cerebral ischemic attack, unspecified: Secondary | ICD-10-CM | POA: Diagnosis not present

## 2021-10-06 DIAGNOSIS — M50222 Other cervical disc displacement at C5-C6 level: Secondary | ICD-10-CM | POA: Diagnosis not present

## 2021-10-06 DIAGNOSIS — I700 Atherosclerosis of aorta: Secondary | ICD-10-CM | POA: Insufficient documentation

## 2021-10-06 DIAGNOSIS — R200 Anesthesia of skin: Secondary | ICD-10-CM | POA: Diagnosis present

## 2021-10-06 DIAGNOSIS — E1140 Type 2 diabetes mellitus with diabetic neuropathy, unspecified: Secondary | ICD-10-CM | POA: Diagnosis not present

## 2021-10-06 DIAGNOSIS — E669 Obesity, unspecified: Secondary | ICD-10-CM

## 2021-10-06 DIAGNOSIS — R29818 Other symptoms and signs involving the nervous system: Secondary | ICD-10-CM | POA: Diagnosis not present

## 2021-10-06 DIAGNOSIS — E782 Mixed hyperlipidemia: Secondary | ICD-10-CM

## 2021-10-06 DIAGNOSIS — I10 Essential (primary) hypertension: Secondary | ICD-10-CM | POA: Diagnosis present

## 2021-10-06 DIAGNOSIS — Z789 Other specified health status: Secondary | ICD-10-CM | POA: Diagnosis present

## 2021-10-06 DIAGNOSIS — M47812 Spondylosis without myelopathy or radiculopathy, cervical region: Secondary | ICD-10-CM | POA: Diagnosis not present

## 2021-10-06 LAB — CBG MONITORING, ED
Glucose-Capillary: 136 mg/dL — ABNORMAL HIGH (ref 70–99)
Glucose-Capillary: 62 mg/dL — ABNORMAL LOW (ref 70–99)
Glucose-Capillary: 152 mg/dL — ABNORMAL HIGH (ref 70–99)

## 2021-10-06 LAB — URINE DRUG SCREEN, QUALITATIVE (ARMC ONLY)
Amphetamines, Ur Screen: NOT DETECTED
Barbiturates, Ur Screen: NOT DETECTED
Benzodiazepine, Ur Scrn: NOT DETECTED
Cannabinoid 50 Ng, Ur ~~LOC~~: NOT DETECTED
Cocaine Metabolite,Ur ~~LOC~~: NOT DETECTED
MDMA (Ecstasy)Ur Screen: NOT DETECTED
Methadone Scn, Ur: NOT DETECTED
Opiate, Ur Screen: NOT DETECTED
Phencyclidine (PCP) Ur S: NOT DETECTED
Tricyclic, Ur Screen: NOT DETECTED

## 2021-10-06 LAB — DIFFERENTIAL
Abs Immature Granulocytes: 0.03 10*3/uL (ref 0.00–0.07)
Basophils Absolute: 0 10*3/uL (ref 0.0–0.1)
Basophils Relative: 0 %
Eosinophils Absolute: 0.1 10*3/uL (ref 0.0–0.5)
Eosinophils Relative: 1 %
Immature Granulocytes: 0 %
Lymphocytes Relative: 26 %
Lymphs Abs: 1.8 10*3/uL (ref 0.7–4.0)
Monocytes Absolute: 0.6 10*3/uL (ref 0.1–1.0)
Monocytes Relative: 9 %
Neutro Abs: 4.5 10*3/uL (ref 1.7–7.7)
Neutrophils Relative %: 64 %

## 2021-10-06 LAB — COMPREHENSIVE METABOLIC PANEL
ALT: 50 U/L — ABNORMAL HIGH (ref 0–44)
AST: 39 U/L (ref 15–41)
Albumin: 3.7 g/dL (ref 3.5–5.0)
Alkaline Phosphatase: 57 U/L (ref 38–126)
Anion gap: 9 (ref 5–15)
BUN: 14 mg/dL (ref 8–23)
CO2: 27 mmol/L (ref 22–32)
Calcium: 9 mg/dL (ref 8.9–10.3)
Chloride: 100 mmol/L (ref 98–111)
Creatinine, Ser: 0.95 mg/dL (ref 0.61–1.24)
GFR, Estimated: >60 mL/min (ref >60)
Glucose, Bld: 144 mg/dL — ABNORMAL HIGH (ref 70–99)
Potassium: 3.9 mmol/L (ref 3.5–5.1)
Sodium: 136 mmol/L (ref 135–145)
Total Bilirubin: 0.9 mg/dL (ref 0.3–1.2)
Total Protein: 7.1 g/dL (ref 6.5–8.1)

## 2021-10-06 LAB — CBC
HCT: 41 % (ref 39.0–52.0)
Hemoglobin: 12.8 g/dL — ABNORMAL LOW (ref 13.0–17.0)
MCH: 27.8 pg (ref 26.0–34.0)
MCHC: 31.2 g/dL (ref 30.0–36.0)
MCV: 88.9 fL (ref 80.0–100.0)
Platelets: 187 10*3/uL (ref 150–400)
RBC: 4.61 MIL/uL (ref 4.22–5.81)
RDW: 13.6 % (ref 11.5–15.5)
WBC: 7 10*3/uL (ref 4.0–10.5)
nRBC: 0 % (ref 0.0–0.2)

## 2021-10-06 LAB — GLUCOSE, CAPILLARY: Glucose-Capillary: 195 mg/dL — ABNORMAL HIGH (ref 70–99)

## 2021-10-06 LAB — ECHOCARDIOGRAM COMPLETE BUBBLE STUDY
AR max vel: 3.35 cm2
AV Area VTI: 2.79 cm2
AV Area mean vel: 2.73 cm2
AV Mean grad: 2 mmHg
AV Peak grad: 3.6 mmHg
Ao pk vel: 0.95 m/s
Area-P 1/2: 3.66 cm2
MV VTI: 2.26 cm2
S' Lateral: 2.53 cm

## 2021-10-06 LAB — APTT: aPTT: 29 seconds (ref 24–36)

## 2021-10-06 LAB — HEMOGLOBIN A1C
Hgb A1c MFr Bld: 7 % — ABNORMAL HIGH (ref 4.8–5.6)
Mean Plasma Glucose: 154.2 mg/dL

## 2021-10-06 LAB — PROTIME-INR
INR: 0.9 (ref 0.8–1.2)
Prothrombin Time: 12 seconds (ref 11.4–15.2)

## 2021-10-06 LAB — LDL CHOLESTEROL, DIRECT: Direct LDL: 52.8 mg/dL (ref 0–99)

## 2021-10-06 IMAGING — MR MR HEAD W/O CM
13 series · 48 of 48 positions shown · non-contrast
Comparison: CT angiography same day.

CLINICAL DATA: Neuro deficit, acute, stroke suspected. Abnormal
sensation of the right upper extremity.

EXAM:
MRI HEAD WITHOUT CONTRAST
TECHNIQUE: Multiplanar, multiecho pulse sequences of the brain and surrounding
structures were obtained without intravenous contrast.

[Series 11: ax dwi_tracew · axial · 3.0mm · 0.71mm/px · z∈[-114,+49]mm · 3 of 56 slices shown]
[im 1/56]
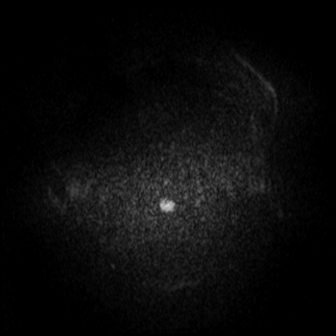
[im 28/56]
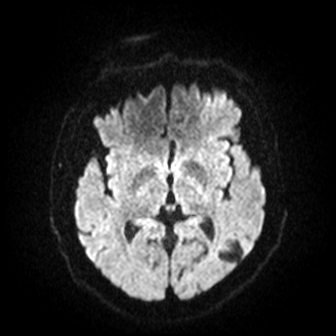
[im 56/56]
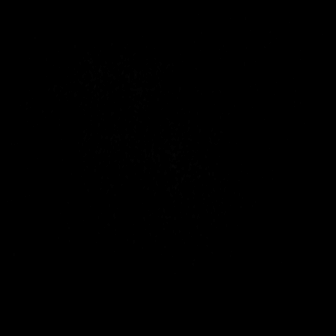

[Series 12: ax dwi_adc · axial · 3.0mm · 0.71mm/px · z∈[-114,+46]mm · 3 of 55 slices shown]
[im 1/55]
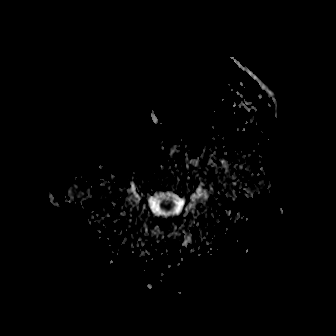
[im 28/55]
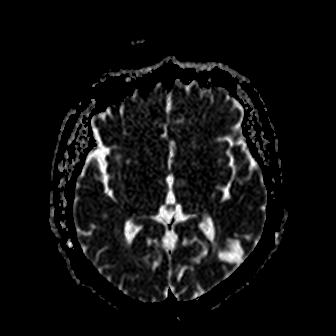
[im 55/55]
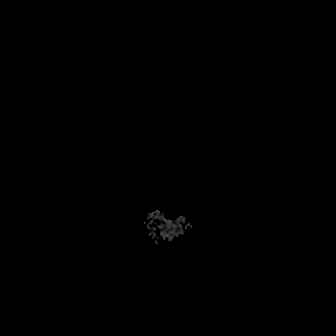

[Series 13: cor dwi_tracew · coronal · 5.0mm · 0.68mm/px · 2 of 40 slices shown]
[im 1/40]
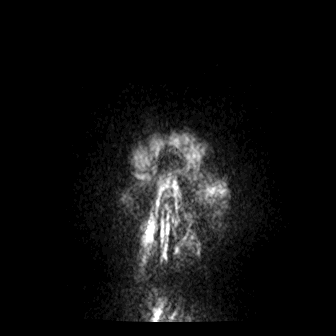
[im 40/40]
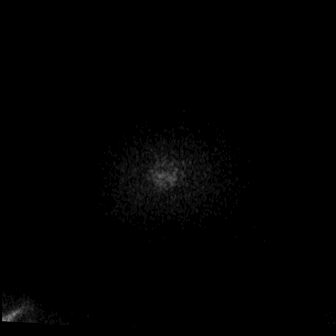

[Series 14: cor dwi_adc · coronal · 5.0mm · 0.68mm/px · 3 of 39 slices shown]
[im 1/39]
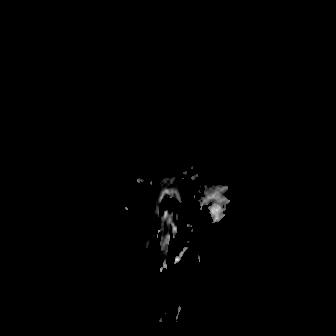
[im 20/39]
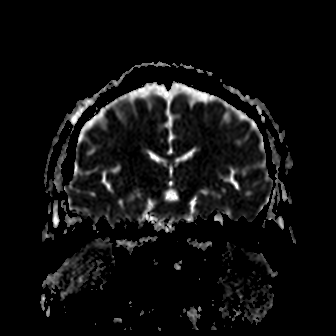
[im 39/39]
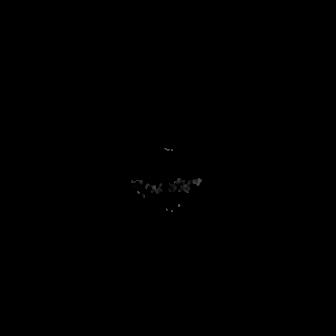

[Series 15: T1 · sagittal · 5.0mm · 0.47mm/px · 2 of 24 slices shown (1 of 2)]
[im 1/24]
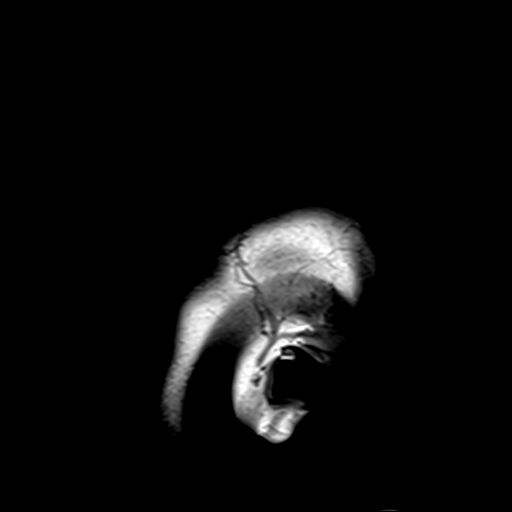
[im 24/24]
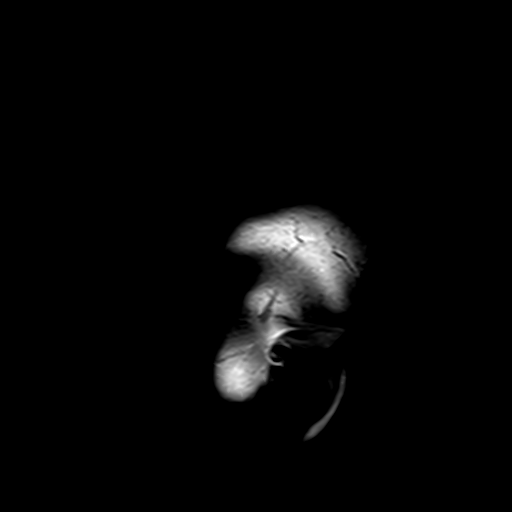

[Series 16: T2 · axial · 5.0mm · 0.86mm/px · z∈[-104,+44]mm · 2 of 26 slices shown (1 of 2)]
[im 1/26]
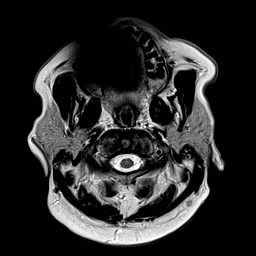
[im 26/26]
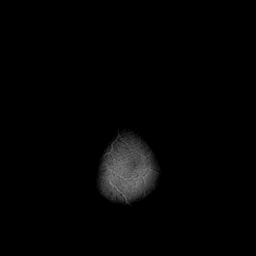

[Series 17: ax swi_mag · axial · 3.0mm · 0.90mm/px · z∈[-112,+51]mm · 4 of 56 slices shown]
[im 1/56]
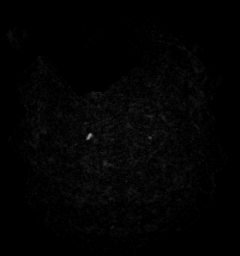
[im 19/56]
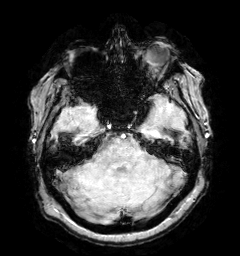
[im 37/56]
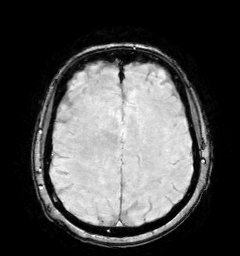
[im 56/56]
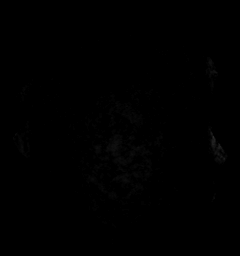

[Series 18: ax swi_pha · axial · 3.0mm · 0.90mm/px · z∈[-112,+51]mm · 4 of 56 slices shown]
[im 1/56]
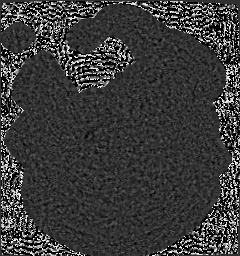
[im 19/56]
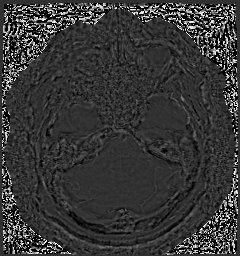
[im 37/56]
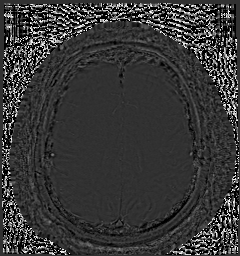
[im 56/56]
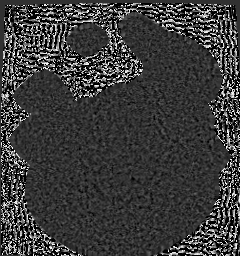

[Series 19: ax swi_swi · axial · 3.0mm · 0.90mm/px · z∈[-112,+51]mm · 4 of 56 slices shown]
[im 1/56]
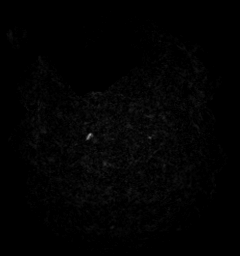
[im 19/56]
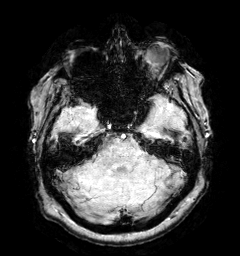
[im 37/56]
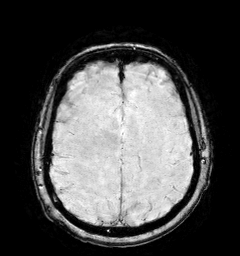
[im 56/56]
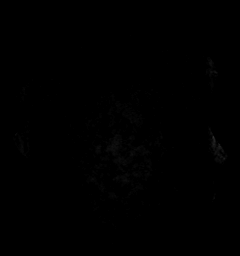

[Series 20: ax swi_swi_mip · axial · 24.0mm · 0.90mm/px · z∈[-101,+41]mm · 3 of 49 slices shown]
[im 1/49]
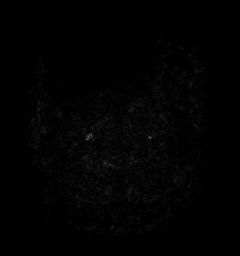
[im 25/49]
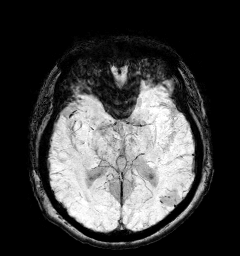
[im 49/49]
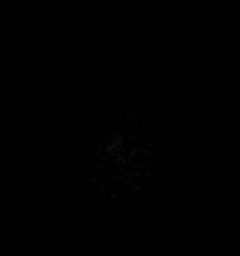

[Series 21: FLAIR · axial · 3.0mm · 0.69mm/px · z∈[-110,+50]mm · 4 of 55 slices shown]
[im 1/55]
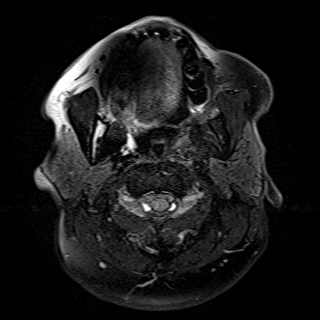
[im 19/55]
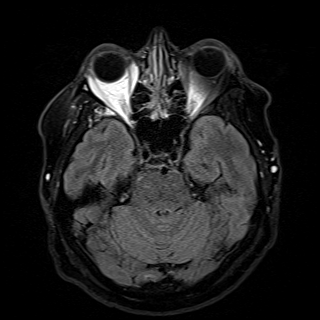
[im 37/55]
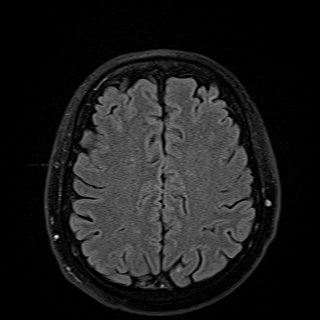
[im 55/55]
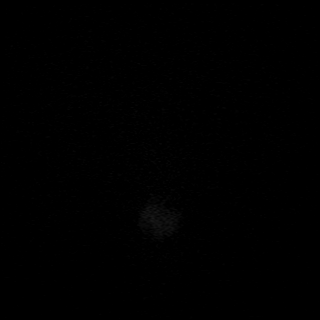

[Series 22: T1 · axial · 1.0mm · 0.98mm/px · z∈[-119,+53]mm · 12 of 173 slices shown (2 of 2)]
[im 1/173]
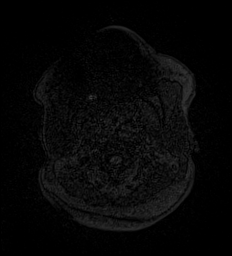
[im 16/173]
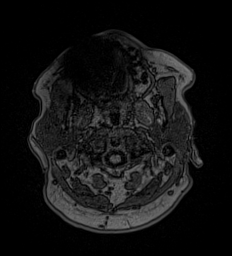
[im 32/173]
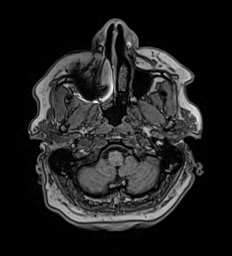
[im 47/173]
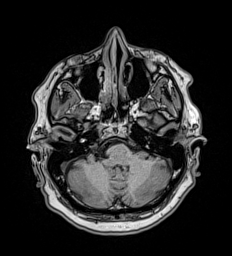
[im 63/173]
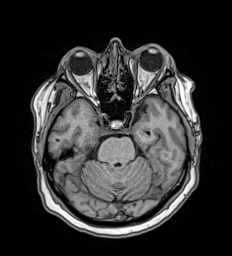
[im 79/173]
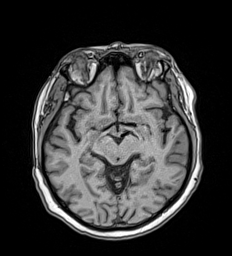
[im 94/173]
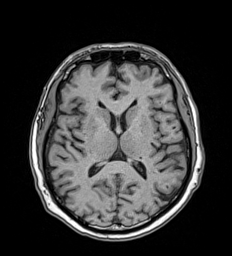
[im 110/173]
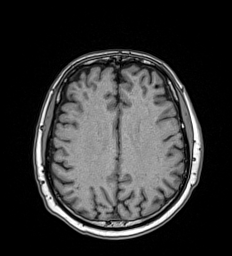
[im 126/173]
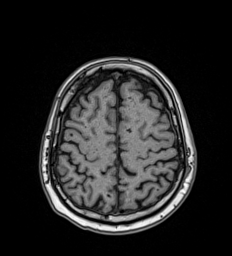
[im 141/173]
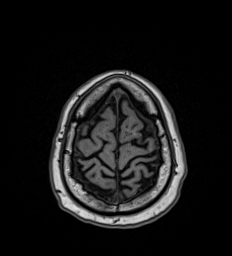
[im 157/173]
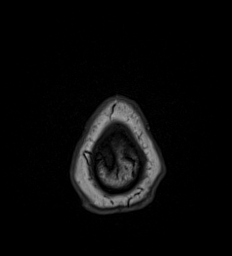
[im 173/173]
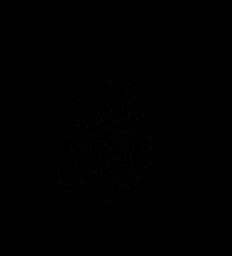

[Series 23: T2 · coronal · 5.0mm · 0.86mm/px · 2 of 30 slices shown (2 of 2)]
[im 1/30]
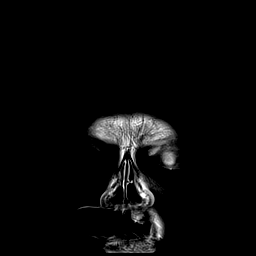
[im 30/30]
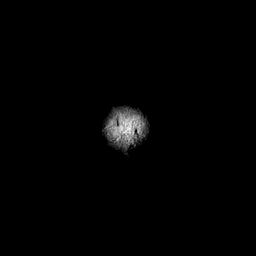

[48 of 48 positions shown; findings below may reference images not displayed]

FINDINGS: Brain: Diffusion imaging shows a punctate acute infarction at the
left frontoparietal vertex, probably affecting the post central
gyrus. No other acute infarction. Old small vessel infarction in the
right side of the pons. No focal cerebellar finding. Cerebral
hemispheres show minimal small vessel change of the white matter,
and old cortical and subcortical infarction at the left posterior
temporoparietal junction, and a few other scattered tiny cortical
infarctions in the left frontal and parietal region. No evidence of
mass, hemorrhage, hydrocephalus or extra-axial collection.

Vascular: Major vessels at the base of the brain show flow.

Skull and upper cervical spine: Negative

Sinuses/Orbits: Clear/normal

Other: None
IMPRESSION: Punctate acute infarction at the left frontoparietal vertex,
probably affecting the post central gyrus.

Old cortical and subcortical infarction at the left posterior
temporoparietal junction.

Few other tiny old cortical infarctions in the left frontal and
parietal regions.

## 2021-10-06 IMAGING — CT CT HEAD CODE STROKE
4 series · 16 of 47 positions shown, 18 images · non-contrast
Comparison: None.

CLINICAL DATA: Code stroke.  Neuro deficit, acute, stroke suspected



[Series 3: head wo · axial · 0.41mm/px · z∈[-62,+43]mm · 7 of 29 slices shown, 9 images]
[im 4/29  brain]
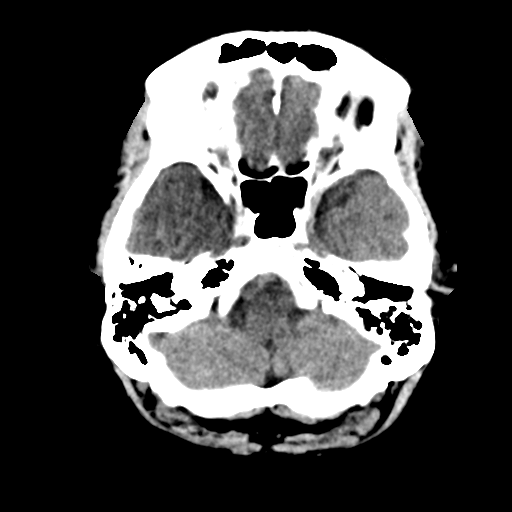
[im 4/29  bone]
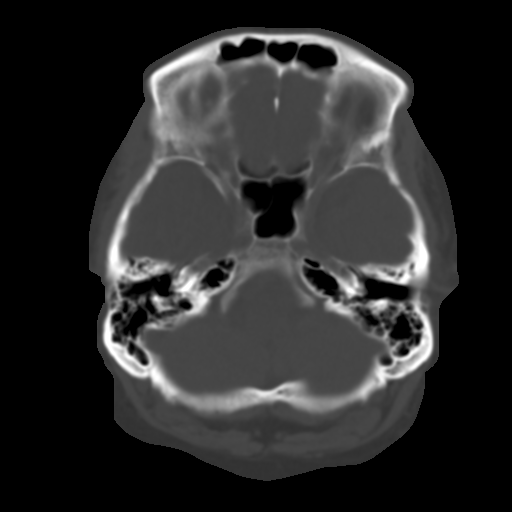
[im 8/29  brain]
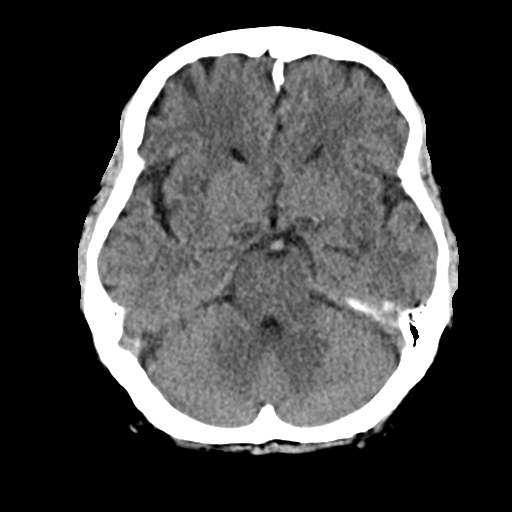
[im 11/29  brain]
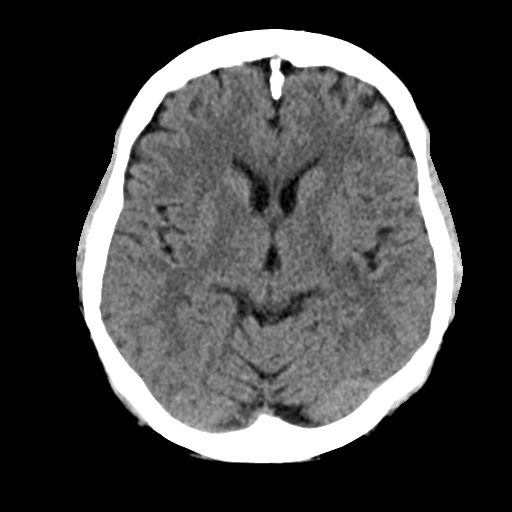
[im 15/29  brain]
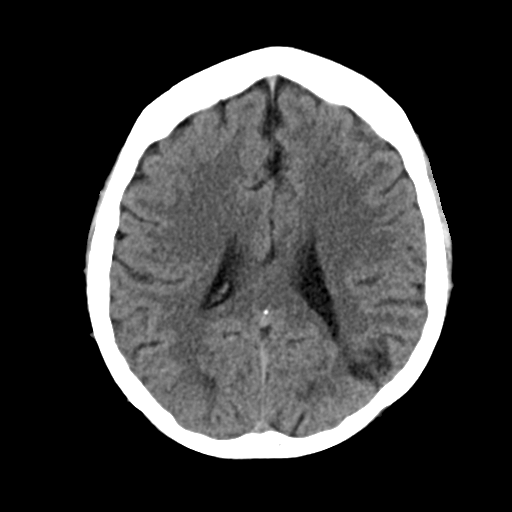
[im 18/29  brain]
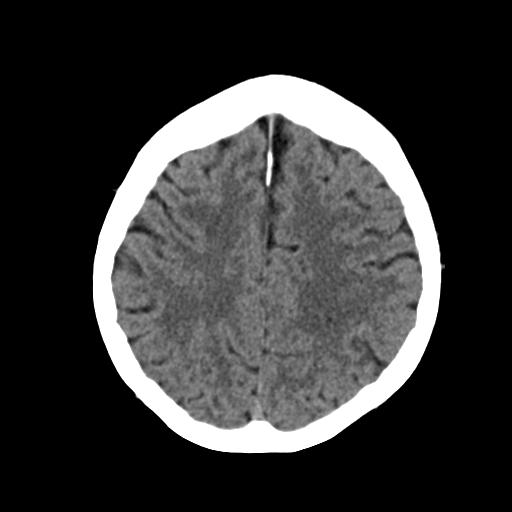
[im 18/29  bone]
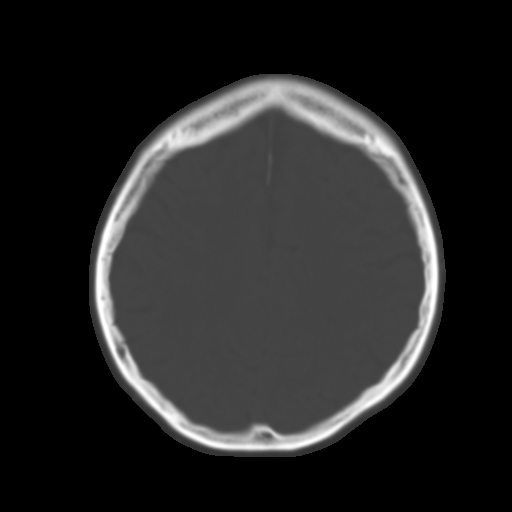
[im 22/29  brain]
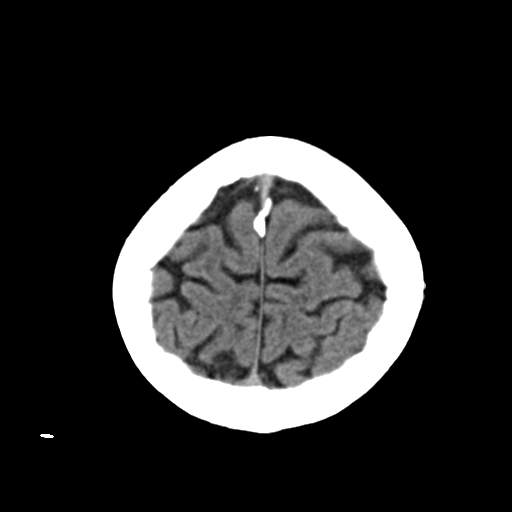
[im 25/29  brain]
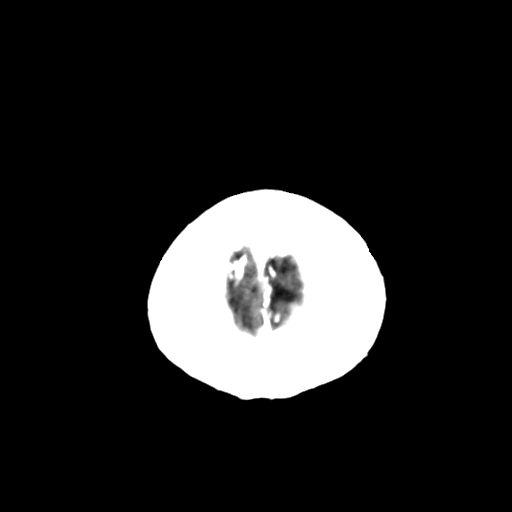

[Series 4: head bone · axial · 0.41mm/px · z∈[-63,-35]mm · 3 of 71 slices shown]
[im 8/71  bone]
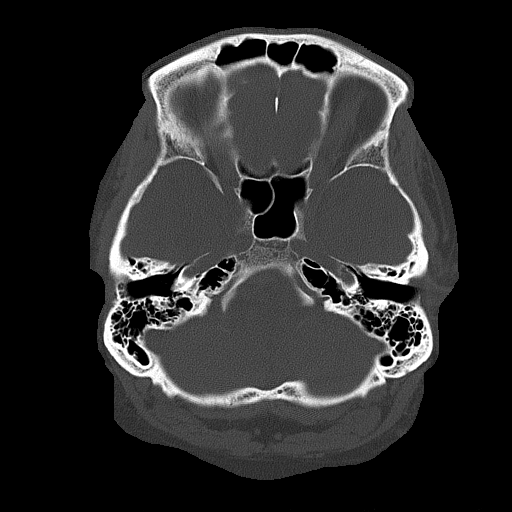
[im 15/71  bone]
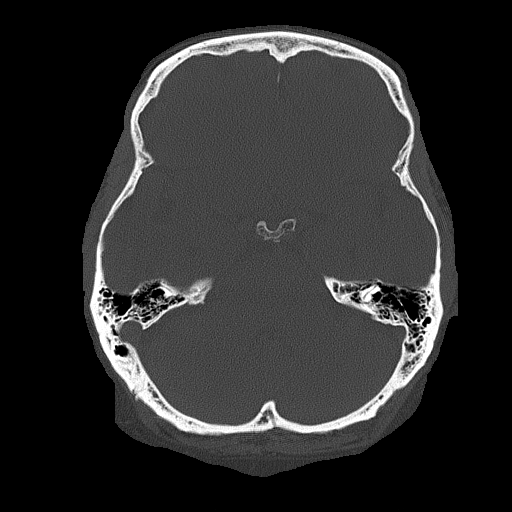
[im 22/71  bone]
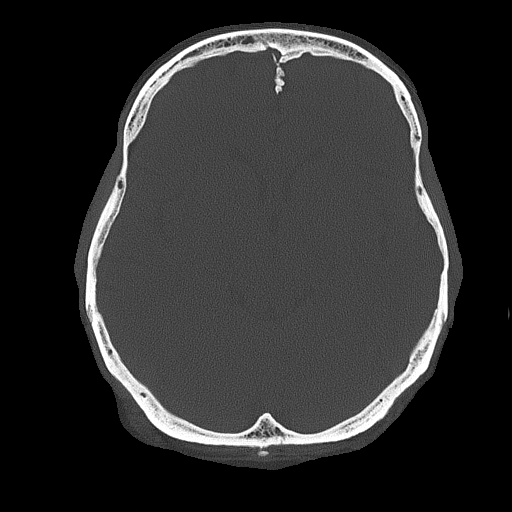

[Series 5: coronal soft tissue · coronal · 0.29mm/px · 3 of 65 slices shown]
[im 22/65  brain]
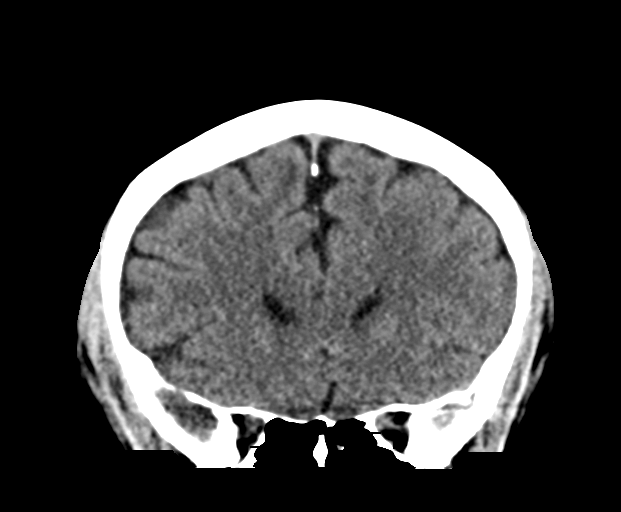
[im 29/65  brain]
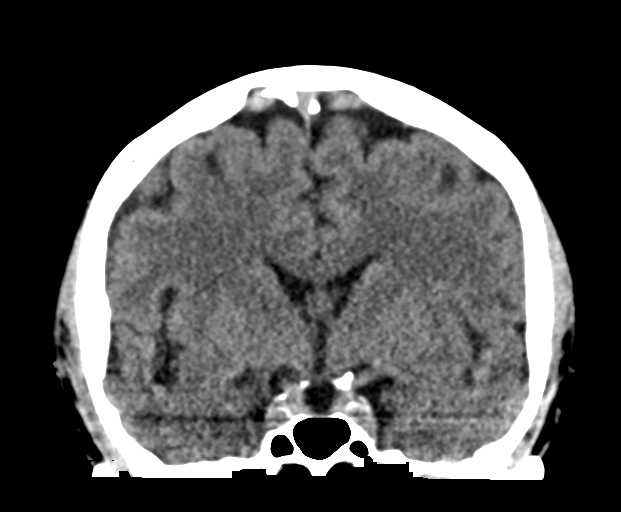
[im 36/65  brain]
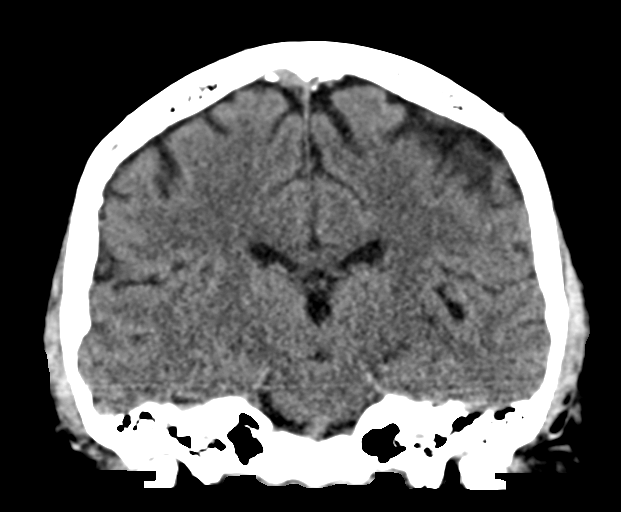

[Series 6: sagittal soft tissue · sagittal · 0.30mm/px · 3 of 62 slices shown]
[im 21/62  brain]
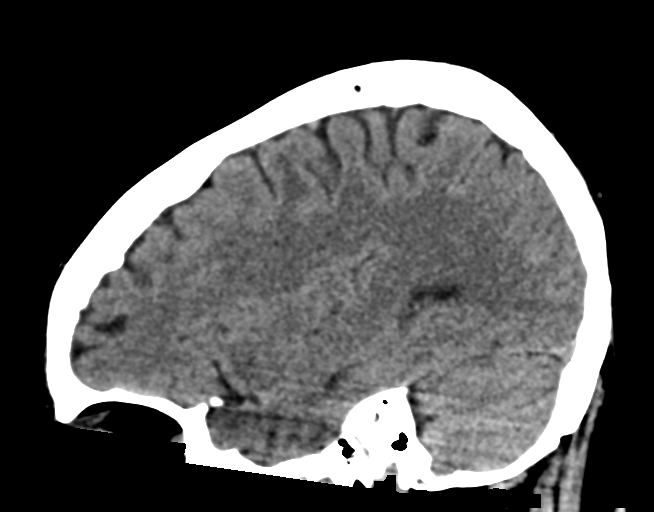
[im 31/62  brain]
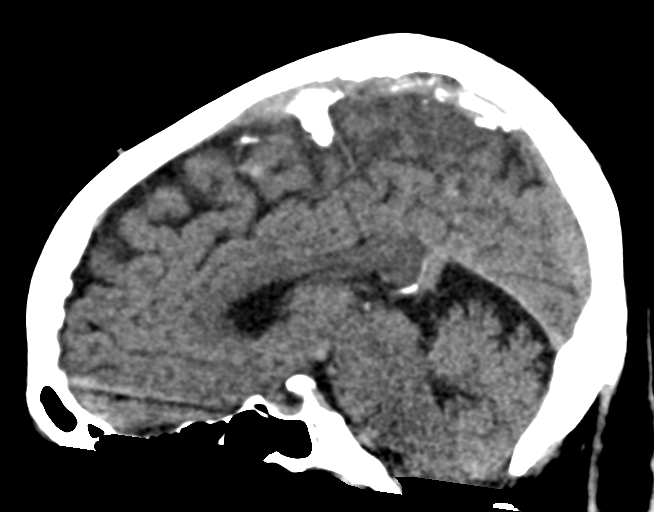
[im 41/62  brain]
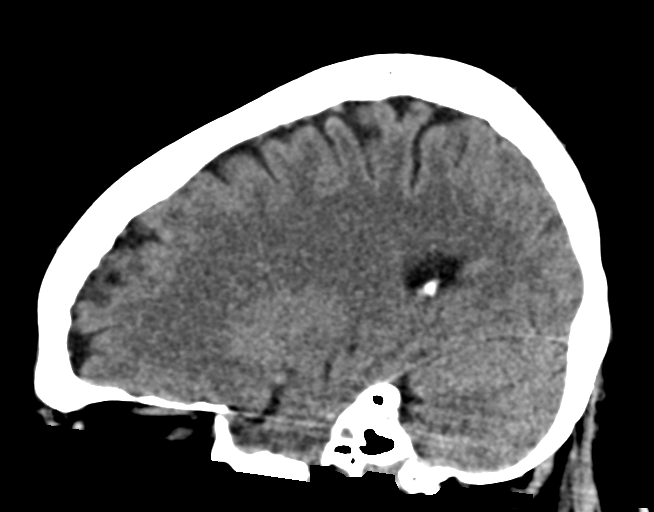

[16 of 47 positions shown; findings below may reference images not displayed]

FINDINGS: Brain: no evidence of acute infarction, hemorrhage, hydrocephalus,
extra-axial collection or mass lesion/mass effect. Remote left
parieto-occipital infarct with encephalomalacia in this region.
Partially empty sella.

Vascular: No hyperdense vessel.

Skull: No acute fracture.

Sinuses/Orbits: Visualized sinuses are clear. No acute findings in
the visualized orbits.

Other: No mastoid effusions.

ASPECTS ([place] Stroke Program Early CT Score) total score (0-10
with 10 being normal): 10.
IMPRESSION: 1. No evidence of acute intracranial abnormality. ASPECTS is 10.
2. Remote left parieto-occipital infarct. MRI could provide more
sensitive evaluation for acute peri-infarct ischemia if clinically
warranted.

Code stroke imaging results were communicated on [DATE] at [DATE] to provider [facility] via secure text paging.

## 2021-10-06 IMAGING — CT CT ANGIO HEAD-NECK (W OR W/O PERF)
2 of 7 series · 10 of 33 positions shown · non-contrast
Comparison: Noncontrast head CT performed earlier today [DATE].

CLINICAL DATA: Provided history: Neuro deficit, acute, stroke
suspected. Additional history provided: Paresthesia of right arm.

EXAM:
CT ANGIOGRAPHY HEAD AND NECK
TECHNIQUE: Multidetector CT imaging of the head and neck was performed using
the standard protocol during bolus administration of intravenous
contrast. Multiplanar CT image reconstructions and MIPs were
obtained to evaluate the vascular anatomy. Carotid stenosis
measurements (when applicable) are obtained utilizing NASCET
criteria, using the distal internal carotid diameter as the
denominator.

[Series 5: cta neck/head · axial · 0.54mm/px · z∈[-232,-64]mm · 3 of 169 slices shown]
[im 43/169  soft-tissue]
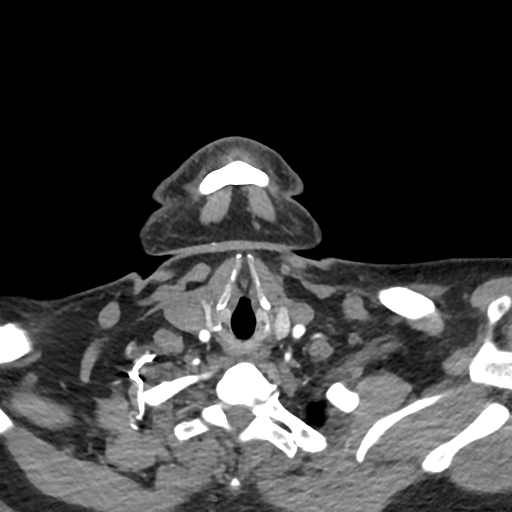
[im 85/169  soft-tissue]
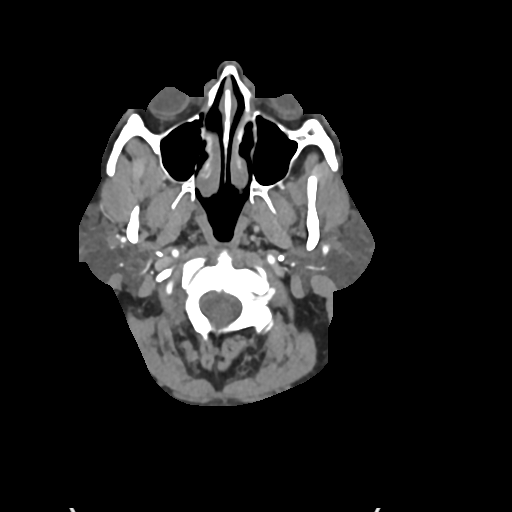
[im 127/169  soft-tissue]
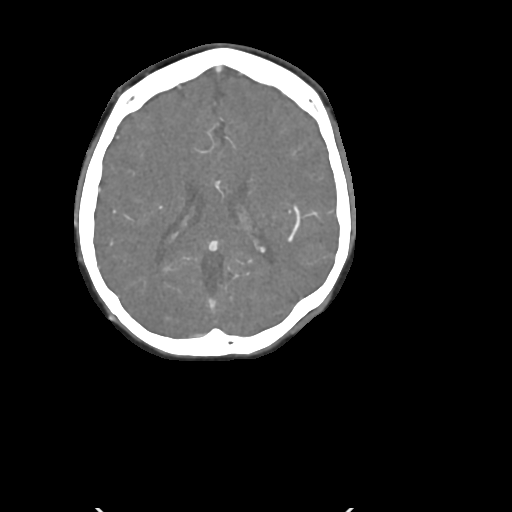

[Series 7: ax thins · axial · 0.39mm/px · z∈[-339,-113]mm · 7 of 348 slices shown]
[im 44/348  soft-tissue]
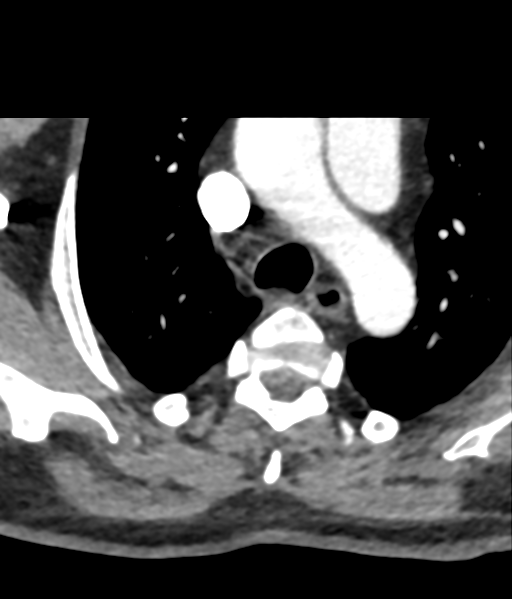
[im 87/348  bone]
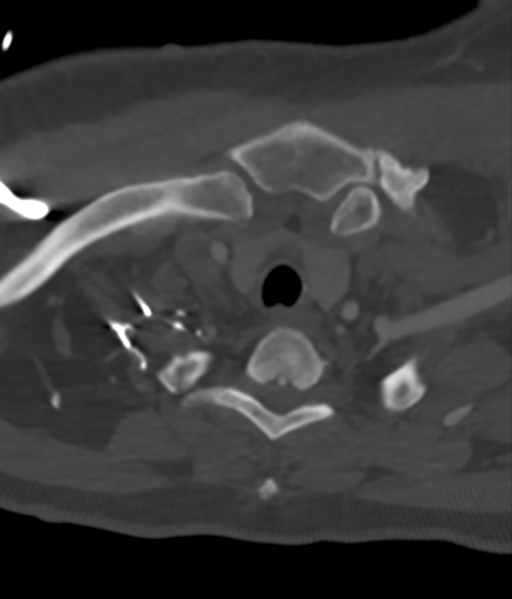
[im 131/348  soft-tissue]
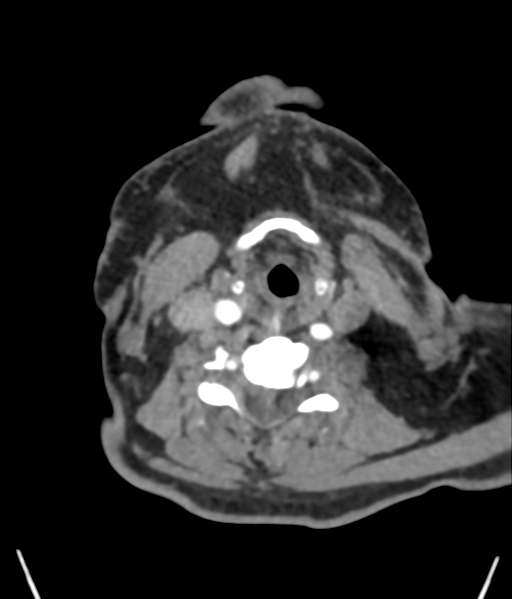
[im 174/348  bone]
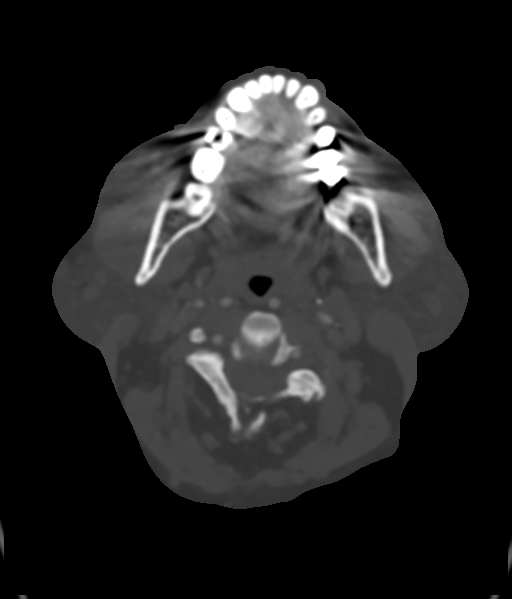
[im 217/348  soft-tissue]
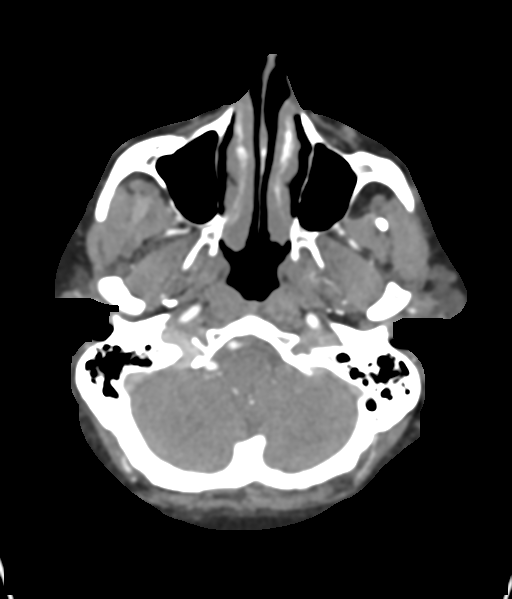
[im 261/348  bone]
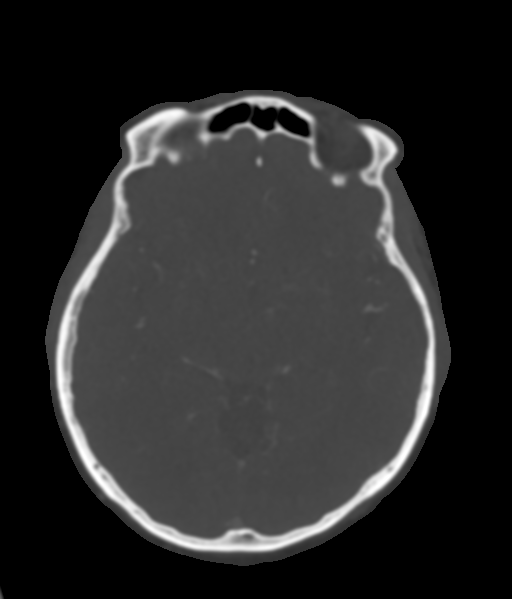
[im 304/348  soft-tissue]
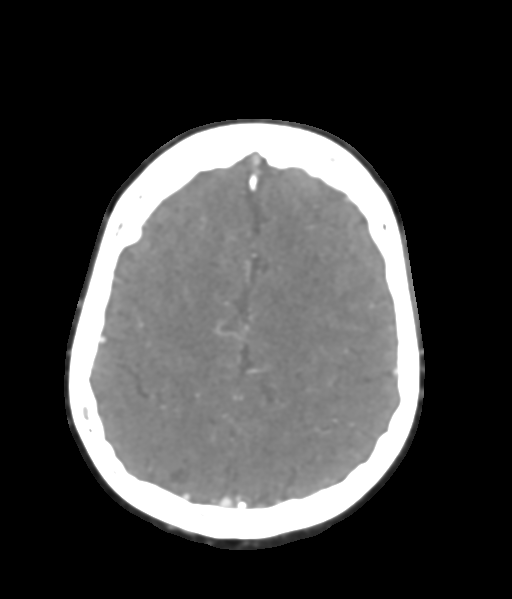

[10 of 33 positions shown; findings below may reference images not displayed]

RADIATION DOSE REDUCTION: This exam was performed according to the
departmental dose-optimization program which includes automated
exposure control, adjustment of the mA and/or kV according to
patient size and/or use of iterative reconstruction technique.

CONTRAST:  100mL OMNIPAQUE IOHEXOL 350 MG/ML SOLN
FINDINGS: CTA NECK FINDINGS

Aortic arch: Standard aortic branching. Mild calcified plaque within
the visualized aortic arch. Streak and beam hardening artifact
arising from a dense right-sided contrast bolus partially obscures
the right subclavian artery. Within this limitation, there is no
appreciable hemodynamically significant innominate or proximal
subclavian artery stenosis.

Right carotid system: CCA and ICA patent within neck without
stenosis or significant atherosclerotic disease. Partially
retropharyngeal course of the cervical ICA.

Left carotid system: CCA and ICA patent within the neck without
stenosis or significant atherosclerotic disease. Partially
retropharyngeal course of the cervical ICA.

Vertebral arteries: Vertebral arteries codominant and patent within
the neck without stenosis or significant atherosclerotic disease.

Skeleton: Reversal of the expected cervical lordosis. Cervical
spondylosis. Vertebral body ankylosis at C6-C7. No acute bony
abnormality or aggressive osseous lesion.

Other neck: No neck mass or cervical lymphadenopathy.

Upper chest: No consolidation within the imaged lung apices.

Review of the MIP images confirms the above findings

CTA HEAD FINDINGS

Anterior circulation:

The intracranial internal carotid arteries are patent. The M1 middle
cerebral arteries are patent. No M2 proximal branch occlusion or
high-grade proximal stenosis is identified. The anterior cerebral
arteries are patent. No intracranial aneurysm is identified.

Posterior circulation:

The intracranial vertebral arteries are patent. The basilar artery
is patent. The posterior cerebral arteries are patent. A right
posterior communicating artery is present. The left posterior
communicating artery is diminutive or absent.

Venous sinuses: Within the limitations of contrast timing, no
convincing thrombus.

Anatomic variants: As described.

Review of the MIP images confirms the above findings

No emergent large vessel occlusion identified. These results were
communicated to [REDACTED] At [DATE] [facility] [DATE]by text page via the
[employer] messaging system.
IMPRESSION: CTA neck:

1. The common carotid, internal carotid and vertebral arteries are
patent within the neck without stenosis or significant
atherosclerotic disease.
2.  Aortic Atherosclerosis ([ZS]-[ZS]).

CTA head:

No intracranial large vessel occlusion or proximal high-grade
arterial stenosis.

## 2021-10-06 IMAGING — MR MR CERVICAL SPINE W/O CM
5 series · 39 of 48 positions shown · non-contrast
Comparison: Head and neck CTA [DATE]

CLINICAL DATA: RUE numbness/paresthesias.

EXAM:
MRI CERVICAL SPINE WITHOUT CONTRAST
TECHNIQUE: Multiplanar, multisequence MR imaging of the cervical spine was
performed. No intravenous contrast was administered.

[Series 5: T2 · sagittal · 3.0mm · 0.62mm/px · 8 of 15 slices shown (1 of 2)]
[im 1/15]
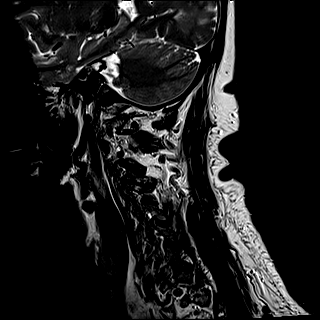
[im 3/15]
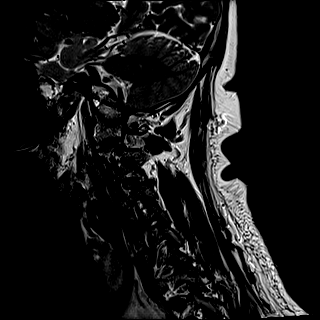
[im 5/15]
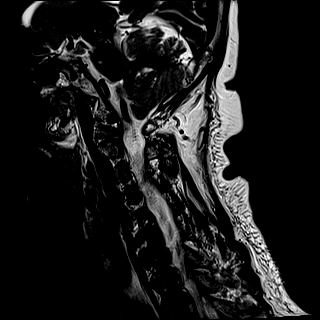
[im 7/15]
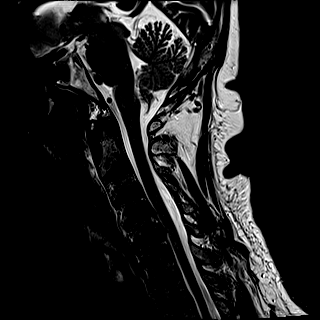
[im 9/15]
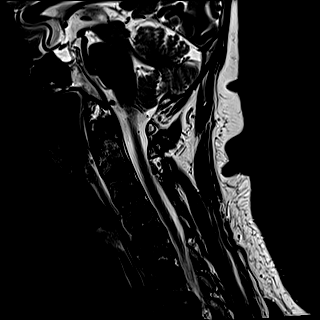
[im 11/15]
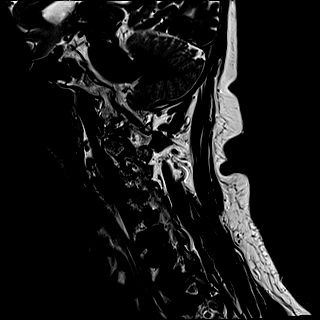
[im 13/15]
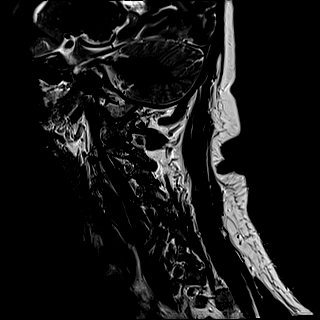
[im 15/15]
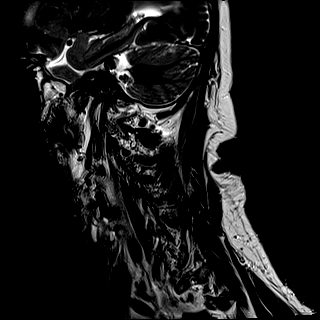

[Series 6: FLAIR · sagittal · 3.0mm · 0.78mm/px · 7 of 15 slices shown]
[im 1/15]
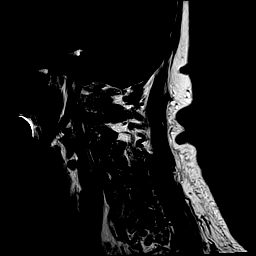
[im 3/15]
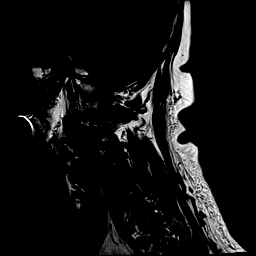
[im 5/15]
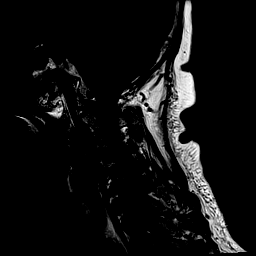
[im 8/15]
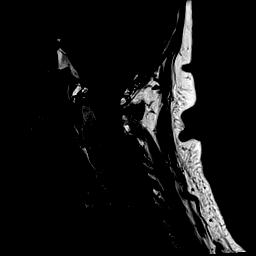
[im 10/15]
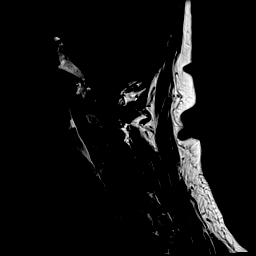
[im 12/15]
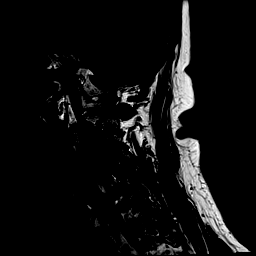
[im 15/15]
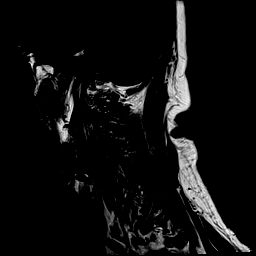

[Series 7: STIR · sagittal · 3.0mm · 0.62mm/px · 7 of 15 slices shown]
[im 1/15]
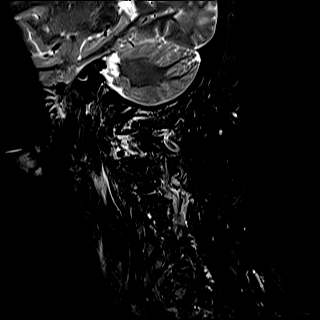
[im 3/15]
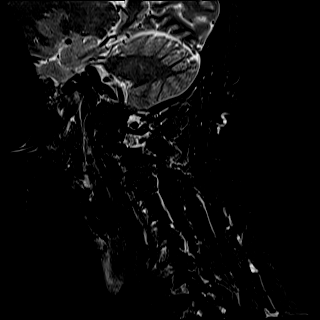
[im 5/15]
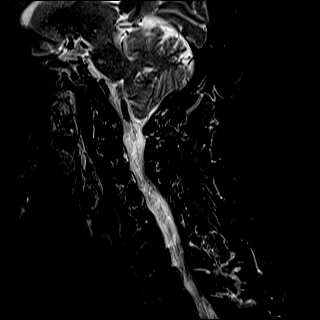
[im 8/15]
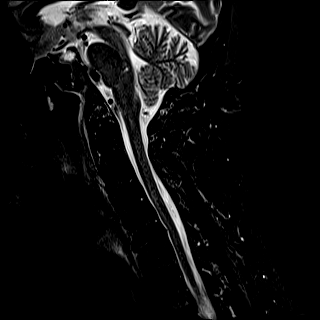
[im 10/15]
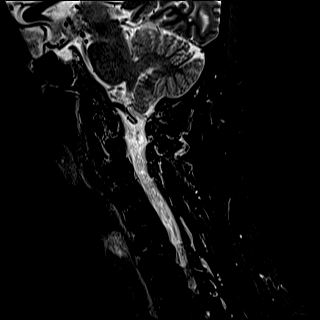
[im 12/15]
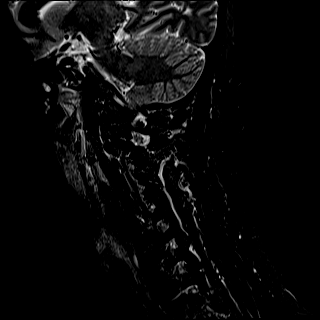
[im 15/15]
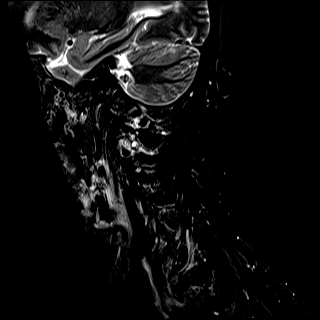

[Series 8: T2 · axial · 3.0mm · 0.70mm/px · z∈[-210,-121]mm · 9 of 28 slices shown (2 of 2)]
[im 1/28]
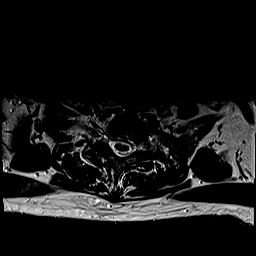
[im 5/28]
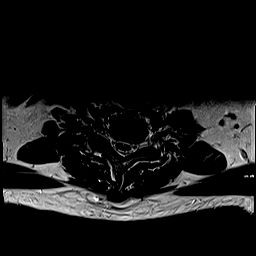
[im 10/28]
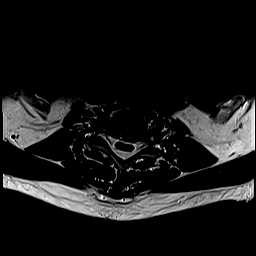
[im 12/28]
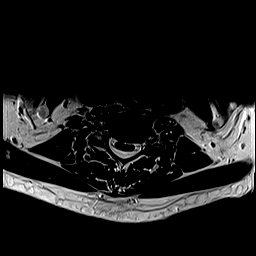
[im 14/28]
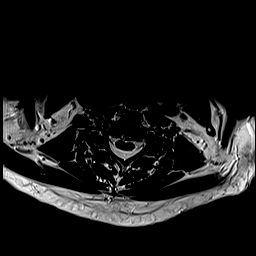
[im 16/28]
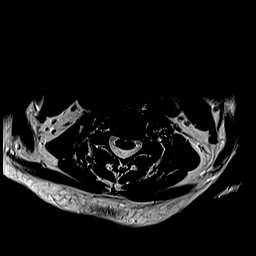
[im 19/28]
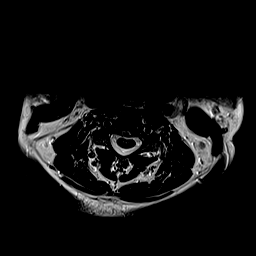
[im 23/28]
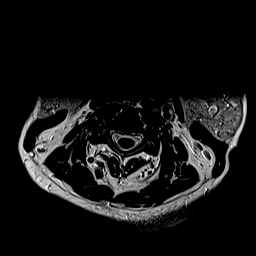
[im 28/28]
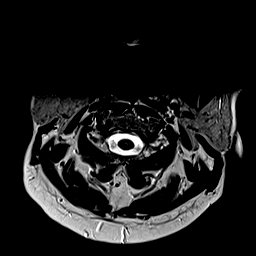

[Series 9: ax mpgr · axial · 3.0mm · 0.35mm/px · z∈[-210,-121]mm · 8 of 28 slices shown]
[im 1/28]
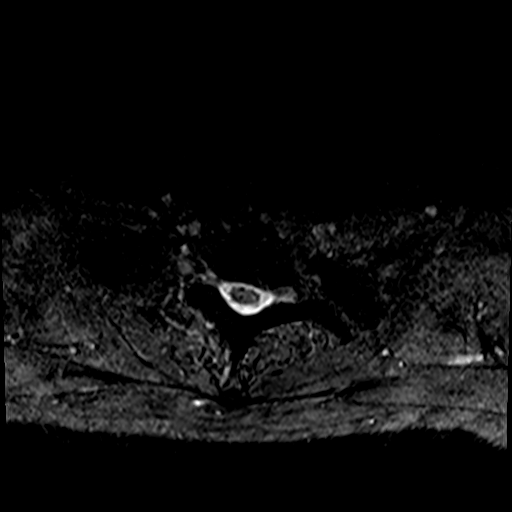
[im 5/28]
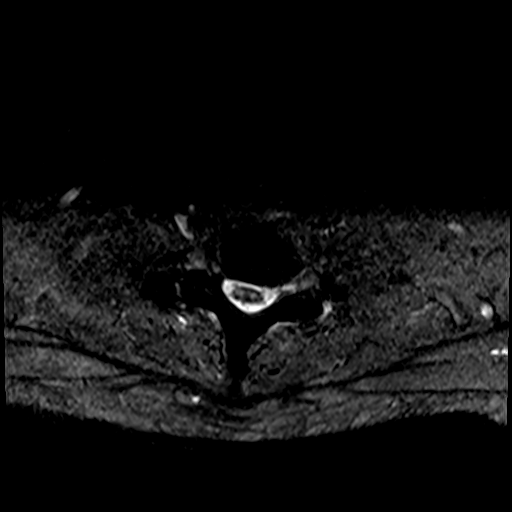
[im 10/28]
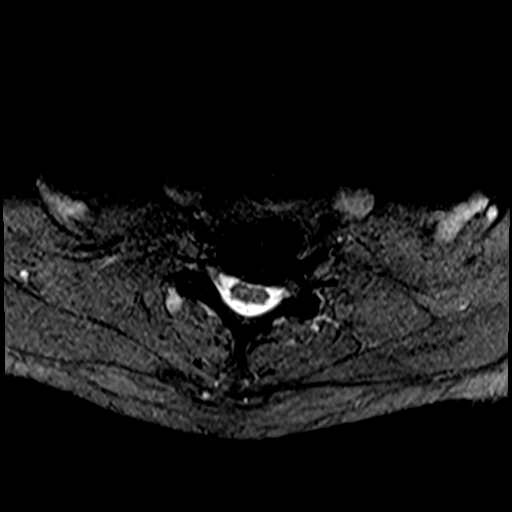
[im 12/28]
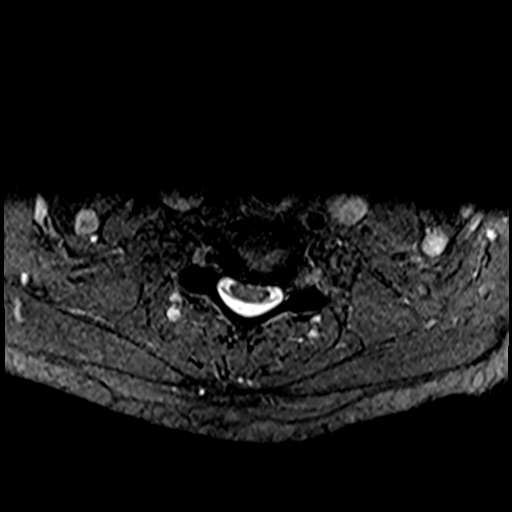
[im 16/28]
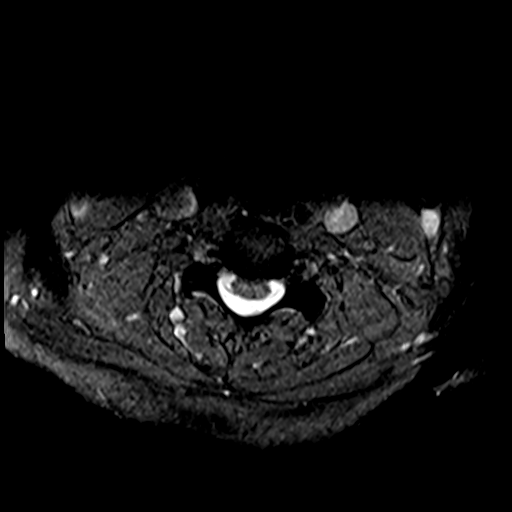
[im 19/28]
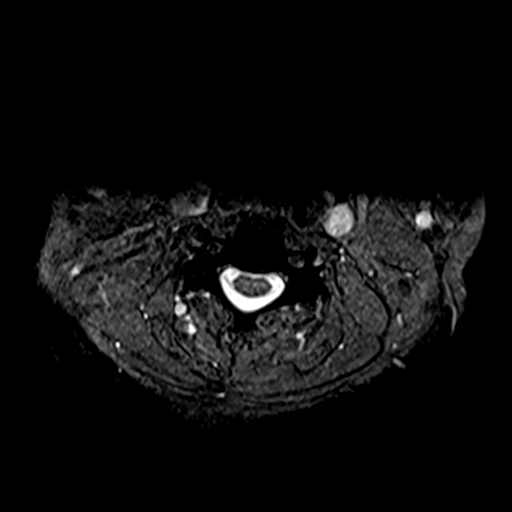
[im 23/28]
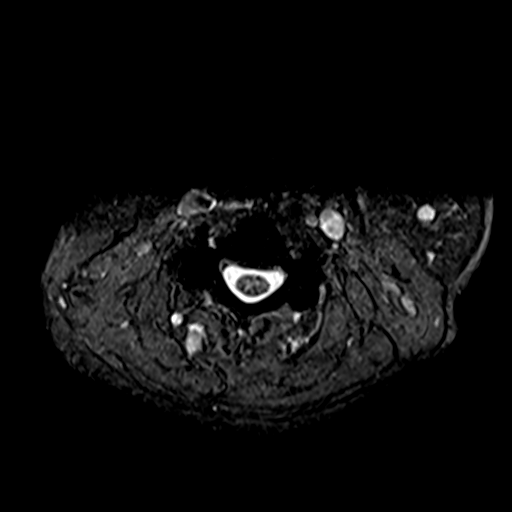
[im 28/28]
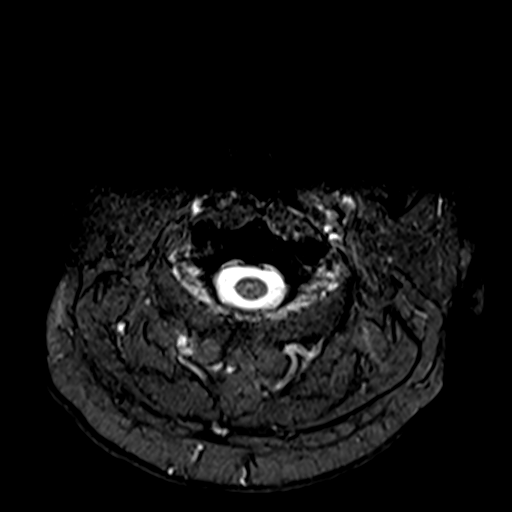

[39 of 48 positions shown; findings below may reference images not displayed]

FINDINGS: Alignment: Straightening of the normal cervical lordosis. No
significant listhesis.

Vertebrae: No acute fracture, suspicious marrow lesion, or
significant marrow edema. Chronic T1 vertebral compression
deformity.

Cord: Normal signal.

Posterior Fossa, vertebral arteries, paraspinal tissues: Posterior
fossa more fully evaluated on today's separate head MRI. Preserved
vertebral artery flow voids.

Disc levels:

C2-3: Moderate to severe facet arthrosis and minimal uncovertebral
spurring result in mild right neural foraminal stenosis without
spinal stenosis.

C3-4: Severe right facet arthrosis result in mild-to-moderate right
neural foraminal stenosis without spinal stenosis.

C4-5: Severe left facet arthrosis with facet ankylosis. No stenosis.

C5-6: Mild disc bulging results in a slight impression on the
ventral spinal cord without stenosis. Mild facet arthrosis.

C6-7: Interbody ankylosis. Mild osteophytic ridging without
stenosis.

C7-T1: Mild uncovertebral spurring and mild facet arthrosis without
significant stenosis.
IMPRESSION: 1. Advanced cervical facet arthrosis with mild-to-moderate right
neural foraminal stenosis at C3-4 and mild right neural foraminal
stenosis at C2-3.
2. No spinal stenosis.

## 2021-10-06 MED ORDER — ASPIRIN EC 81 MG PO TBEC
81.0000 mg | DELAYED_RELEASE_TABLET | Freq: Every day | ORAL | Status: DC
Start: 2021-10-07 — End: 2021-10-07
  Administered 2021-10-07: 81 mg via ORAL
  Filled 2021-10-06: qty 1

## 2021-10-06 MED ORDER — ALBUTEROL SULFATE (2.5 MG/3ML) 0.083% IN NEBU: 3.0000 mL | INHALATION_SOLUTION | RESPIRATORY_TRACT | Status: DC | PRN

## 2021-10-06 MED ORDER — LORAZEPAM 2 MG/ML IJ SOLN: 0.0000 mg | Freq: Four times a day (QID) | INTRAMUSCULAR | Status: DC

## 2021-10-06 MED ORDER — LORAZEPAM 2 MG/ML IJ SOLN: 1.0000 mg | Freq: Once | INTRAMUSCULAR | Status: DC | PRN

## 2021-10-06 MED ORDER — INSULIN ASPART 100 UNIT/ML IJ SOLN: 0.0000 [IU] | Freq: Every day | INTRAMUSCULAR | Status: DC

## 2021-10-06 MED ORDER — ATORVASTATIN CALCIUM 20 MG PO TABS
80.0000 mg | ORAL_TABLET | Freq: Every day | ORAL | Status: DC
  Administered 2021-10-06 – 2021-10-07 (×2): 80 mg via ORAL
  Filled 2021-10-06 (×2): qty 4

## 2021-10-06 MED ORDER — SENNOSIDES-DOCUSATE SODIUM 8.6-50 MG PO TABS: 1.0000 | ORAL_TABLET | Freq: Every evening | ORAL | Status: DC | PRN

## 2021-10-06 MED ORDER — ACETAMINOPHEN 160 MG/5ML PO SOLN
650.0000 mg | ORAL | Status: DC | PRN
  Filled 2021-10-06: qty 20.3

## 2021-10-06 MED ORDER — ADULT MULTIVITAMIN W/MINERALS CH: 1.0000 | ORAL_TABLET | Freq: Every day | ORAL | Status: DC

## 2021-10-06 MED ORDER — ONDANSETRON HCL 4 MG/2ML IJ SOLN
4.0000 mg | Freq: Three times a day (TID) | INTRAMUSCULAR | Status: DC | PRN
Start: 2021-10-06 — End: 2021-10-07

## 2021-10-06 MED ORDER — THIAMINE HCL 100 MG/ML IJ SOLN: 100.0000 mg | Freq: Every day | INTRAMUSCULAR | Status: DC

## 2021-10-06 MED ORDER — SODIUM CHLORIDE 0.9% FLUSH: 3.0000 mL | Freq: Once | INTRAVENOUS | Status: DC

## 2021-10-06 MED ORDER — INSULIN ASPART 100 UNIT/ML IJ SOLN
0.0000 [IU] | Freq: Three times a day (TID) | INTRAMUSCULAR | Status: DC
  Administered 2021-10-06: 2 [IU] via SUBCUTANEOUS
  Administered 2021-10-07: 1 [IU] via SUBCUTANEOUS
  Administered 2021-10-07: 2 [IU] via SUBCUTANEOUS
  Filled 2021-10-06 (×2): qty 1

## 2021-10-06 MED ORDER — LORAZEPAM 1 MG PO TABS: 1.0000 mg | ORAL_TABLET | ORAL | Status: DC | PRN

## 2021-10-06 MED ORDER — FOLIC ACID 1 MG PO TABS
1.0000 mg | ORAL_TABLET | Freq: Every day | ORAL | Status: DC
  Administered 2021-10-06 – 2021-10-07 (×2): 1 mg via ORAL
  Filled 2021-10-06 (×2): qty 1

## 2021-10-06 MED ORDER — CLOPIDOGREL BISULFATE 75 MG PO TABS
75.0000 mg | ORAL_TABLET | Freq: Every day | ORAL | Status: DC
  Administered 2021-10-06 – 2021-10-07 (×2): 75 mg via ORAL
  Filled 2021-10-06 (×2): qty 1

## 2021-10-06 MED ORDER — ENOXAPARIN SODIUM 40 MG/0.4ML IJ SOSY
40.0000 mg | PREFILLED_SYRINGE | INTRAMUSCULAR | Status: DC
  Administered 2021-10-06: 40 mg via SUBCUTANEOUS
  Filled 2021-10-06: qty 0.4

## 2021-10-06 MED ORDER — ASPIRIN 325 MG PO TABS
325.0000 mg | ORAL_TABLET | Freq: Once | ORAL | Status: AC
  Administered 2021-10-06: 325 mg via ORAL
  Filled 2021-10-06: qty 1

## 2021-10-06 MED ORDER — ACETAMINOPHEN 650 MG RE SUPP: 650.0000 mg | RECTAL | Status: DC | PRN

## 2021-10-06 MED ORDER — IOHEXOL 350 MG/ML SOLN
100.0000 mL | Freq: Once | INTRAVENOUS | Status: AC | PRN
  Administered 2021-10-06: 100 mL via INTRAVENOUS

## 2021-10-06 MED ORDER — LORAZEPAM 2 MG/ML IJ SOLN: 1.0000 mg | INTRAMUSCULAR | Status: DC | PRN

## 2021-10-06 MED ORDER — HYDRALAZINE HCL 20 MG/ML IJ SOLN: 5.0000 mg | INTRAMUSCULAR | Status: DC | PRN

## 2021-10-06 MED ORDER — ADULT MULTIVITAMIN W/MINERALS CH
ORAL_TABLET | Freq: Every day | ORAL | Status: DC
  Administered 2021-10-06 – 2021-10-07 (×2): 1 via ORAL
  Filled 2021-10-06 (×2): qty 1

## 2021-10-06 MED ORDER — ACETAMINOPHEN 325 MG PO TABS: 650.0000 mg | ORAL_TABLET | ORAL | Status: DC | PRN

## 2021-10-06 MED ORDER — STROKE: EARLY STAGES OF RECOVERY BOOK
Freq: Once | Status: DC
Start: 2021-10-06 — End: 2021-10-07

## 2021-10-06 MED ORDER — KRILL OIL 300 MG PO CAPS: 300.0000 mg | ORAL_CAPSULE | Freq: Every day | ORAL | Status: DC

## 2021-10-06 MED ORDER — THIAMINE HCL 100 MG PO TABS
100.0000 mg | ORAL_TABLET | Freq: Every day | ORAL | Status: DC
  Administered 2021-10-06 – 2021-10-07 (×2): 100 mg via ORAL
  Filled 2021-10-06 (×2): qty 1

## 2021-10-06 MED ORDER — LORAZEPAM 2 MG/ML IJ SOLN: 0.0000 mg | Freq: Two times a day (BID) | INTRAMUSCULAR | Status: DC

## 2021-10-06 NOTE — Consult Note (Signed)
NEUROLOGY CONSULTATION NOTE  ? ?Date of service: October 06, 2021 ?Patient Name: Robert Robertson. ?MRN:  [id number] ?DOB:  17-Jan-1951 ?Reason for consult: stroke code ?Requesting physician: Delman Kitten MD ?_ _ _   _ __   _ __ _ _  __ __   _ __   __ _ ? ?History of Present Illness  ? ?This a 71 year old gentleman with past medical history significant for diabetes, hyperlipidemia, hypertension who presented to the ED with paresthesias of the right arm only.  Last known well 7:30 AM.  Symptoms were not present upon awakening.  They initially involve the entire arm from the shoulder down to the fingertips and all of the fingers were involved.  There was no associated weakness. Symptoms are rapidly improving and he only has some mild paresthesias in his fingers that at this time.  No other neurologic deficits.  Head CT personally reviewed and showed no definitive acute intracranial process although there is possibly a small hypodensity in the left thalamus.  There is no blood in the scan.  TNK was not administered due to NIH stroke scale of 1 and rapidly improving symptoms.  Exam was not consistent with LVO therefore CTA was not performed as part of the stroke code.  Was not on anticoagulation.  Patient was previously on a daily aspirin but stopped this as his primary care doctor said he no longer needed it. ? ?Patient occasionally has had milder right arm numbness upon awakening which he previously contributed to sleeping on it wrong.  Has no neck pain and no history of trauma. ?  ?ROS  ? ?Per HPI: all other systems reviewed and are negative ? ?Past History  ? ?I have reviewed the following: ? ?Past Medical History:  ?Diagnosis Date  ? Allergy   ? Colon polyp   ? Diabetes mellitus   ? Hyperlipidemia   ? Hypertension   ? ?Past Surgical History:  ?Procedure Laterality Date  ? COLONOSCOPY  2010  ? 10 mm adenomatous polyp in the descending colon without atypia, [name], MD  ? COLONOSCOPY WITH PROPOFOL N/A 07/18/2019  ?  Procedure: COLONOSCOPY WITH PROPOFOL;  Surgeon: Robert Bellow, MD;  Location: [facility] ENDOSCOPY;  Service: Endoscopy;  Laterality: N/A;  ? ?Family History  ?Problem Relation Age of Onset  ? Diabetes Mother   ? Stroke Mother   ? Colon cancer Mother   ? Diabetes Brother   ? Diabetes Maternal Grandfather   ? Diabetes Maternal Grandmother   ? Cancer Sister   ?     brain tumor  ? ?Social History  ? ?Socioeconomic History  ? Marital status: Married  ?  Spouse name: Not on file  ? Number of children: Not on file  ? Years of education: Not on file  ? Highest education level: Not on file  ?Occupational History  ? Not on file  ?Tobacco Use  ? Smoking status: Former  ?  Packs/day: 0.50  ?  Years: 45.00  ?  Pack years: 22.50  ?  Types: Cigarettes  ?  Quit date: 2015  ?  Years since quitting: 8.2  ? Smokeless tobacco: Never  ?Vaping Use  ? Vaping Use: Never used  ?Substance and Sexual Activity  ? Alcohol use: Yes  ?  Alcohol/week: 14.0 standard drinks  ?  Types: 14 Glasses of wine per week  ?  Comment: daily  ? Drug use: No  ? Sexual activity: Not on file  ?Other Topics Concern  ?  Not on file  ?Social History Narrative  ? Not on file  ? ?Social Determinants of Health  ? ?Financial Resource Strain: Low Risk   ? Difficulty of Paying Living Expenses: Not hard at all  ?Food Insecurity: No Food Insecurity  ? Worried About Charity fundraiser in the Last Year: Never true  ? Ran Out of Food in the Last Year: Never true  ?Transportation Needs: No Transportation Needs  ? Lack of Transportation (Medical): No  ? Lack of Transportation (Non-Medical): No  ?Physical Activity: Not on file  ?Stress: No Stress Concern Present  ? Feeling of Stress : Not at all  ?Social Connections: Not on file  ? ?Allergies  ?Allergen Reactions  ? Pollen Extract Other (See Comments)  ?  Hay-fever. Reaction: Sneezing  ? ? ?Medications  ? ?(Not in a hospital admission) ?  ? ? ?Current Facility-Administered Medications:  ?  sodium chloride flush (NS) 0.9 %  injection 3 mL, 3 mL, Intravenous, Once, Delman Kitten, MD ? ?Current Outpatient Medications:  ?  atorvastatin (LIPITOR) 80 MG tablet, TAKE 1 TABLET DAILY, Disp: 90 tablet, Rfl: 3 ?  blood glucose meter kit and supplies, USE TO CHECK BLOOD SUGARS UP TO TWO TIMES DAILY, Disp: 1 each, Rfl: 0 ?  Blood Glucose Monitoring Suppl (FREESTYLE FREEDOM LITE) w/Device KIT, 1 application by Does not apply route 2 (two) times daily., Disp: 1 kit, Rfl: 0 ?  glucose blood (FREESTYLE LITE) test strip, USE TO CHECK SUGARS TWICE A DAY, Disp: 200 each, Rfl: 1 ?  hydrochlorothiazide (HYDRODIURIL) 25 MG tablet, TAKE 1 TABLET DAILY, Disp: 90 tablet, Rfl: 3 ?  JANUVIA 100 MG tablet, TAKE 1 TABLET DAILY, Disp: 90 tablet, Rfl: 3 ?  Krill Oil 300 MG CAPS, Take 1 capsule by mouth daily., Disp: , Rfl:  ?  Lancets (FREESTYLE) lancets, USE TO CHECK SUGAR TWICE A DAY, Disp: 200 each, Rfl: 3 ?  metFORMIN (GLUCOPHAGE) 1000 MG tablet, TAKE 1 TABLET TWICE A DAY WITH MEALS, Disp: 180 tablet, Rfl: 3 ?  Multiple Vitamins-Minerals (CENTRUM SILVER ADULT 50+ PO), Take 1 tablet by mouth daily., Disp: , Rfl:  ?  Telmisartan-amLODIPine 40-10 MG TABS, Take 1 tablet by mouth at bedtime., Disp: 90 tablet, Rfl: 1 ? ?Vitals  ? ?Vitals:  ? 10/06/21 0755 10/06/21 0759  ?BP: 134/90   ?Pulse: 81   ?Resp: 16   ?Temp:  98.6 ?F (37 ?C)  ?TempSrc:  Oral  ?SpO2: 98%   ?  ? ?There is no height or weight on file to calculate BMI. ? ?Physical Exam  ? ?Physical Exam ?Gen: A&O x4, NAD ?HEENT: Atraumatic, normocephalic;mucous membranes moist; oropharynx clear, tongue without atrophy or fasciculations. ?Neck: Supple, trachea midline. ?Resp: CTAB, no w/r/r ?CV: RRR, no m/g/r; nml S1 and S2. 2+ symmetric peripheral pulses. ?Abd: soft/NT/ND; nabs x 4 quad ?Extrem: Nml bulk; no cyanosis, clubbing, or edema. ? ?Neuro: ?*MS: A&O x4. Follows multi-step commands.  ?*Speech: fluid, nondysarthric, able to name and repeat ?*CN:  ?  I: Deferred ?  II,III: PERRLA, VFF by confrontation, optic  discs unable to be visualized 2/2 pupillary constriction ?  III,IV,VI: EOMI w/o nystagmus, no ptosis ?  V: Sensation intact from V1 to V3 to LT ?  VII: Eyelid closure was full.  Smile symmetric. ?  VIII: Hearing intact to voice ?  IX,X: Voice normal, palate elevates symmetrically  ?  XI: SCM/trap 5/5 bilat   ?XII: Tongue protrudes midline, no atrophy or fasciculations  ? ?*Motor:  Normal bulk.  No tremor, rigidity or bradykinesia. No pronator drift. ? ?  Strength: Dlt Bic Tri WrE WrF FgS Gr HF KnF KnE PlF DoF  ?  Left 5 5 5 5 5 5 5 5 5 5 5 5   ?  Right 5 5 5 5 5 5 5 5 5 5 5 5   ? ? ?*Sensory: Impaired to LT distal RUE only. No double-simultaneous extinction.  ?*Coordination:  Finger-to-nose, heel-to-shin, rapid alternating motions were intact. ?*Reflexes:  2+ and symmetric throughout without clonus; toes down-going bilat ?*Gait: normal base, normal stride, normal turn. Negative Romberg. ? ?NIHSS = 1 for sensory ? ?Premorbid mRS = 0 ? ? ?Labs  ? ?CBC: No results for input(s): WBC, NEUTROABS, HGB, HCT, MCV, PLT in the last 168 hours. ? ?Basic Metabolic Panel:  ?Lab Results  ?Component Value Date  ? NA 138 09/11/2021  ? K 3.8 09/11/2021  ? CO2 29 09/11/2021  ? GLUCOSE 136 (H) 09/11/2021  ? BUN 14 09/11/2021  ? CREATININE 0.87 09/11/2021  ? CALCIUM 9.6 09/11/2021  ? GFRNONAA 77 (L) 02/28/2012  ? GFRAA 90 (L) 02/28/2012  ? ?Lipid Panel:  ?Lab Results  ?Component Value Date  ? LDLCALC 53 09/11/2021  ? ?HgbA1c:  ?Lab Results  ?Component Value Date  ? HGBA1C 7.4 (H) 09/11/2021  ? ?Urine Drug Screen: No results found for: LABOPIA, COCAINSCRNUR, [place], [place], THCU, LABBARB  ?Alcohol Level No results found for: ETH ? ? ?Impression  ? ?This is a 71 year old gentleman with a past medical history significant for hypertension, diabetes, hyperlipidemia who presented after acute onset of numbness and paresthesias in his right arm only.  These are rapidly improving but he continues to have some mild numbness in his distal right  upper extremity.  No other neurologic deficits.  TNK was not administered due to stroke scale of 1.  Symptoms are concerning for TIA versus small acute ischemic infarct in the left thalamus.  Given that his symp

## 2021-10-06 NOTE — Progress Notes (Signed)
SLP Cancellation Note ? ?Patient Details ?Name: Robert Robertson. ?MRN: [id number] ?DOB: Nov 21, 1950 ? ? ?Cancelled treatment:       Reason Eval/Treat Not Completed: SLP screened, no needs identified, will sign off ? ?Chart reviewed and pt without report of any acute cognitive communication deficits. ST will sign off at this time.  ? ?Celestine Bougie B. Rutherford Nail, M.S., CCC-SLP, CBIS ?Speech-Language Pathologist ?Rehabilitation Services ?Office [phone] ? ?Gethsemane Fischler ?10/06/2021, 1:55 PM ?

## 2021-10-06 NOTE — ED Triage Notes (Signed)
Pt to ED via POV c/o numbness in this right hand. Pt states that he woke up normal this morning. Pt reports that the numbness started in his fingers, moved up his arm, and into his right shoulder. Numbness has resolved in the arm and shoulder. Pt still c/o numbness in his hand. Pt has equal grip strength bilaterally. Equal sensation. Pt denies changes in speech, vision, or balance. Pt is currently in NAD.  ?

## 2021-10-06 NOTE — ED Notes (Signed)
Pt provided dinner tray.

## 2021-10-06 NOTE — ED Notes (Signed)
Pt back from CT and roomed by this RN. Pt placed on monitoring cords and EKG obtained. Pt reports numbness in right hand has almost completely resolved.  ?

## 2021-10-06 NOTE — ED Notes (Signed)
Code  stroke  called  to [facility]  at  [place] ?

## 2021-10-06 NOTE — Progress Notes (Signed)
OT Screen Note ? ?Patient Details ?Name: Robert Robertson. ?MRN: [id number] ?DOB: June 21, 1951 ? ? ?Cancelled Treatment:    Reason Eval/Treat Not Completed: OT screened, no needs identified, will sign off. Consult received, chart reviewed. Upon assessment, pt is functionally at baseline, denies difficulty with ADL or mobility. Ambulated and completed toileting independently in the room without difficulty. Denies difficulty with [facility] tasks such as managing utensils/meal items. Denies any residual numbness in RUE. No skilled OT needs at this time. Will sign off. Please re-consult if additional needs arise.  ? ?Ardeth Perfect., MPH, MS, OTR/L ?ascom [phone] ?10/06/21, 4:13 PM ? ?

## 2021-10-06 NOTE — ED Notes (Signed)
Pt given orange juice to drink ?

## 2021-10-06 NOTE — ED Notes (Signed)
Pt transported to MRI 

## 2021-10-06 NOTE — ED Provider Notes (Signed)
? ?[facility] ?Provider Note ? ? ? Event Date/Time  ? First MD Initiated Contact with Patient 10/06/21 [id number]   ?  (approximate) ? ? ?History  ? ?Numbness ? ? ?HPI ? ?Robert Robertson. is a 71 y.o. male who according to primary care note from March 22 DM type 2 with diabetic mixed hyperlipidemia ([place]), Diabetic nephropathy associated with type 2 diabetes mellitus ([place]), Hepatic steatosis, Tubular adenoma of colon, Essential hypertension, Pulmonary emphysema, unspecified emphysema type ([place]), and Pulmonary nodule ? ?Patient woke up sometime about 4:30 in the morning, he had been pain at about 7:30 in the morning he noticed suddenly that his right arm felt from his fingers up to about the left shoulder.  This persisted, he brought himself to the emergency room for evaluation. ? ?No recent illness.  No headache.  Been in his normal state of health.  He went out last night with friends and had about 2-2-1/2 beers.  Rested at home and woke up about 430 ? ?Denies any drug allergies.  No headache.  No weakness or difficulty with use of his arms or legs.  No trouble walking or using his car. ?  ? ? ?Physical Exam  ? ?Triage Vital Signs: ?ED Triage Vitals  ?Enc Vitals Group  ?   BP 10/06/21 0755 134/90  ?   Pulse Rate 10/06/21 0755 81  ?   Resp 10/06/21 0755 16  ?   Temp 10/06/21 0759 98.6 ?F (37 ?C)  ?   Temp Source 10/06/21 0759 Oral  ?   SpO2 10/06/21 0755 98 %  ?   Weight --   ?   Height --   ?   Head Circumference --   ?   Peak Flow --   ?   Pain Score 10/06/21 0755 0  ?   Pain Loc --   ?   Pain Edu? --   ?   Excl. in [place]? --   ? ? ?Most recent vital signs: ?Vitals:  ? 10/06/21 0759 10/06/21 0830  ?BP:  (!) 129/95  ?Pulse:  81  ?Resp:  (!) 21  ?Temp: 98.6 ?F (37 ?C)   ?SpO2:  98%  ? ? ? ?General: Awake, no distress.  ?CV:  Good peripheral perfusion.  ?Resp:  Normal effort.  ?Abd:  No distention.  ?Other:  Normal and symmetric smile.  Normal use of all muscles and extremities.  No noted ataxia.   Speech is clear.  Reports slight feeling of just slight numbness still over the right side of his arm but reports it seems to be much improved ? ? ?NIH = 1 (performed by neuro MD and stroke RN) ? ? ?ED Results / Procedures / Treatments  ? ?Labs ?(all labs ordered are listed, but only abnormal results are displayed) ?Labs Reviewed  ?CBC - Abnormal; Notable for the following components:  ?    Result Value  ? Hemoglobin 12.8 (*)   ? All other components within normal limits  ?COMPREHENSIVE METABOLIC PANEL - Abnormal; Notable for the following components:  ? Glucose, Bld 144 (*)   ? ALT 50 (*)   ? All other components within normal limits  ?CBG MONITORING, ED - Abnormal; Notable for the following components:  ? Glucose-Capillary 136 (*)   ? All other components within normal limits  ?PROTIME-INR  ?APTT  ?DIFFERENTIAL  ?LDL CHOLESTEROL, DIRECT  ?HEMOGLOBIN A1C  ?CBG MONITORING, ED  ? ? ? ?EKG ? ?Reviewed inter by me  at 8:30 AM ?Heart rate 80 ?QRS 95 ?QTc 450 ?Normal sinus rhythm no evidence of acute ischemia.  Single Q-wave noted in lead III ? ? ?RADIOLOGY ? ?I personally viewed and interpreted the patient's CT scan of the head, negative for acute pathology -on gross review ? ?CT HEAD CODE STROKE WO CONTRAST ? ?Result Date: 10/06/2021 ?CLINICAL DATA:  Code stroke.  Neuro deficit, acute, stroke suspected EXAM: CT HEAD WITHOUT CONTRAST TECHNIQUE: Contiguous axial images were obtained from the base of the skull through the vertex without intravenous contrast. RADIATION DOSE REDUCTION: This exam was performed according to the departmental dose-optimization program which includes automated exposure control, adjustment of the mA and/or kV according to patient size and/or use of iterative reconstruction technique. COMPARISON:  None. FINDINGS: Brain: no evidence of acute infarction, hemorrhage, hydrocephalus, extra-axial collection or mass lesion/mass effect. Remote left parieto-occipital infarct with encephalomalacia in this  region. Partially empty sella. Vascular: No hyperdense vessel. Skull: No acute fracture. Sinuses/Orbits: Visualized sinuses are clear. No acute findings in the visualized orbits. Other: No mastoid effusions. ASPECTS [facility] Stroke Program Early CT Score) total score (0-10 with 10 being normal): 10. IMPRESSION: 1. No evidence of acute intracranial abnormality. ASPECTS is 10. 2. Remote left parieto-occipital infarct. MRI could provide more sensitive evaluation for acute peri-infarct ischemia if clinically warranted. Code stroke imaging results were communicated on 10/06/2021 at 8:21 am to provider [facility] via secure text paging. Electronically Signed   By: Margaretha Sheffield M.D.   On: 10/06/2021 08:21   ? ? ? ? ? ? ?PROCEDURES: ? ?Critical Care performed: Yes, see critical care procedure note(s) ? ?Procedures ?CRITICAL CARE ?Performed by: Delman Kitten ? ? ?Total critical care time: 35 minutes ? ?Critical care time was exclusive of separately billable procedures and treating other patients. ? ?Critical care was necessary to treat or prevent imminent or life-threatening deterioration. ? ?Critical care was time spent personally by me on the following activities: development of treatment plan with patient and/or surrogate as well as nursing, discussions with consultants, evaluation of patient's response to treatment, examination of patient, obtaining history from patient or surrogate, ordering and performing treatments and interventions, ordering and review of laboratory studies, ordering and review of radiographic studies, pulse oximetry and re-evaluation of patient's condition. ? ? ?MEDICATIONS ORDERED IN ED: ?Medications  ?sodium chloride flush (NS) 0.9 % injection 3 mL ( Intravenous Canceled Entry 10/06/21 0831)  ?aspirin tablet 325 mg (has no administration in time range)  ?  Followed by  ?aspirin EC tablet 81 mg (has no administration in time range)  ?clopidogrel (PLAVIX) tablet 75 mg (has no administration in time  range)  ?atorvastatin (LIPITOR) tablet 80 mg (has no administration in time range)  ?LORazepam (ATIVAN) injection 1 mg (has no administration in time range)  ?ondansetron (ZOFRAN) injection 4 mg (has no administration in time range)  ?hydrALAZINE (APRESOLINE) injection 5 mg (has no administration in time range)  ? ? ? ?IMPRESSION / MDM / ASSESSMENT AND PLAN / ED COURSE  ?I reviewed the triage vital signs and the nursing notes. ?             ?               ? ?Differential diagnosis includes, but is not limited to, possible peripheral neuropathy, paresthesia, cervical etiology or also of high concern potential central neurologic cause such as stroke.  Patient does have a mild but yet present paresthesia involving the right arm only.  There is a unilateral  finding.  No associated headache.  CT of the head negative for evidence of hemorrhage. ? ?Patient seen and evaluated with neurology, concern patient may have had a small ischemic stroke.  He will be admitted to the hospital for further care and work-up.  He does not have any acute cardiac vascular pulmonary symptoms.  His hemodynamics and blood pressure are essentially normal.  He is alert no distress at this time.  Denies any preceding or recent illness ? ?Labs reviewed including CBC which shows very mild anemia.  EKG without evidence of acute ischemia or ectopy.  Comprehensive metabolic panel normal except for very minimally elevated ALT ? ?The patient is on the cardiac monitor to evaluate for evidence of arrhythmia and/or significant heart rate changes ? ?Discussed with the patient, he is understanding and agreement with plan for admission to the hospital for further care and management as to cause of his paresthesia with that at this point working diagnosis appears to be possible small ischemic stroke.  Patient is pending further studies including MRI.  Medication orders including antiplatelet orders will be placed by Dr. Quinn Axe ? ?Discussed case care and request  for admission with the hospitalist Dr. Blaine Hamper ?  ? ? ?FINAL CLINICAL IMPRESSION(S) / ED DIAGNOSES  ? ?Final diagnoses:  ?Numbness  ?Cerebrovascular accident (CVA), unspecified mechanism ([place])  ? ? ? ?Rx / DC Or

## 2021-10-06 NOTE — ED Notes (Signed)
Pt daughter at bedside and updated on POC  ?

## 2021-10-06 NOTE — Progress Notes (Signed)
PHARMACIST - PHYSICIAN ORDER COMMUNICATION ? ?CONCERNING: P&T Medication Policy on Herbal Medications ? ?DESCRIPTION:  This patient?s order for:  Astrid Drafts  has been noted. ? ?This product(s) is classified as an ?herbal? or natural product. ?Due to a lack of definitive safety studies or FDA approval, nonstandard manufacturing practices, plus the potential risk of unknown drug-drug interactions while on inpatient medications, the Pharmacy and Therapeutics Committee does not permit the use of ?herbal? or natural products of this type within [facility]. ?  ?ACTION TAKEN: ?The pharmacy department is unable to verify this order at this time. ? ?Please reevaluate patient?s clinical condition at discharge and address if the herbal or natural product(s) should be resumed at that time. ? ?Pernell Dupre, PharmD, BCPS ?Clinical Pharmacist ?10/06/2021 ?9:46 AM ? ? ?

## 2021-10-06 NOTE — ED Notes (Signed)
ECHO at bedside.

## 2021-10-06 NOTE — H&P (Signed)
?History and Physical  ? ? ?Robert Robertson. IFO:[id number] DOB: 1950/12/24 DOA: 10/06/2021 ? ?Referring MD/NP/PA:  ? ?PCP: [name], MD  ? ?Patient coming from:  The patient is coming from home.  At baseline, pt is independent for most of ADL.       ? ?Chief Complaint: right arm numbness ? ?HPI: Robert Robertson. is a 71 y.o. male with medical history significant of HTN, HLD, DM, former smoker, who presents with right arm numbness. ? ? ?Pt states that he woke up normal at about 4:30 AM. At about 7:30 AM, he suddenly start feeling in right arm numb from shoulder to fingers.  No weakness to extremities.  No facial droop or slurred speech.  Patient states that he occasionally has milder right arm numbness upon awakening which he previously contributed to sleeping on it wrong.  Pt dose not have neck pain and no history of trauma. Patient denies chest pain, cough, shortness of breath.  No nausea, vomiting, diarrhea or abdominal pain.  No symptoms of UTI.  No fever or chills.  When I saw patient in the ED, his symptoms have resolved. ? ? ?Data Reviewed and ED Course: pt was found to have WBC 7.0, INR 1.1, PTT 29, temperature normal, blood pressure 129/95, heart rate 81, RR 21, oxygen saturation 98% on room air.  CT of the head showed remote left parieto-occipital infarction.  Patient is placed on telemetry bed for patient, Dr. Quinn Axe of neurology is consulted ? ?CT-head: ? 1. No evidence of acute intracranial abnormality. ASPECTS is 10. ?2. Remote left parieto-occipital infarct. MRI could provide more ?sensitive evaluation for acute peri-infarct ischemia if clinically ?warranted. ? ?EKG: I have personally reviewed.  Sinus rhythm, QTc 455, early R wave progression, Q wave in lead III/aVF ? ? ?Review of Systems:  ? ?General: no fevers, chills, no body weight gain,  fatigue ?HEENT: no blurry vision, hearing changes or sore throat ?Respiratory: no dyspnea, coughing, wheezing ?CV: no chest pain, no  palpitations ?GI: no nausea, vomiting, abdominal pain, diarrhea, constipation ?GU: no dysuria, burning on urination, increased urinary frequency, hematuria  ?Ext: no leg edema ?Neuro: has right arm numbness, no vision change or hearing loss ?Skin: no rash, no skin tear. ?MSK: No muscle spasm, no deformity, no limitation of range of movement in spin ?Heme: No easy bruising.  ?Travel history: No recent long distant travel. ? ? ?Allergy:  ?Allergies  ?Allergen Reactions  ? Pollen Extract Other (See Comments)  ?  Hay-fever. Reaction: Sneezing  ? ? ?Past Medical History:  ?Diagnosis Date  ? Allergy   ? Colon polyp   ? Diabetes mellitus   ? Hyperlipidemia   ? Hypertension   ? ? ?Past Surgical History:  ?Procedure Laterality Date  ? COLONOSCOPY  2010  ? 10 mm adenomatous polyp in the descending colon without atypia, [name], MD  ? COLONOSCOPY WITH PROPOFOL N/A 07/18/2019  ? Procedure: COLONOSCOPY WITH PROPOFOL;  Surgeon: Robert Bellow, MD;  Location[facility] ENDOSCOPY;  Service: Endoscopy;  Laterality: N/A;  ? ? ?Social History:  reports that he quit smoking about 8 years ago. His smoking use included cigarettes. He has a 22.50 pack-year smoking history. He has never used smokeless tobacco. He reports current alcohol use of about 14.0 standard drinks per week. He reports that he does not use drugs. ? ?Family History:  ?Family History  ?Problem Relation Age of Onset  ? Diabetes Mother   ? Stroke Mother   ?  Colon cancer Mother   ? Diabetes Brother   ? Diabetes Maternal Grandfather   ? Diabetes Maternal Grandmother   ? Cancer Sister   ?     brain tumor  ?  ? ?Prior to Admission medications   ?Medication Sig Start Date End Date Taking? Authorizing Provider  ?atorvastatin (LIPITOR) 80 MG tablet TAKE 1 TABLET DAILY 06/08/21   [name], MD  ?blood glucose meter kit and supplies USE TO CHECK BLOOD SUGARS UP TO TWO TIMES DAILY 07/30/19   [name], MD  ?Blood Glucose Monitoring Suppl (FREESTYLE FREEDOM LITE)  w/Device KIT 1 application by Does not apply route 2 (two) times daily. 08/21/19   [name], MD  ?glucose blood (FREESTYLE LITE) test strip USE TO CHECK SUGARS TWICE A DAY 07/07/21   [name], MD  ?hydrochlorothiazide (HYDRODIURIL) 25 MG tablet TAKE 1 TABLET DAILY 11/21/20   [name], MD  ?JANUVIA 100 MG tablet TAKE 1 TABLET DAILY 08/11/21   [name], MD  ?Astrid Drafts 300 MG CAPS Take 1 capsule by mouth daily.    [provider]  ?Lancets (FREESTYLE) lancets USE TO CHECK SUGAR TWICE A DAY 08/08/20   [name], MD  ?metFORMIN (GLUCOPHAGE) 1000 MG tablet TAKE 1 TABLET TWICE A DAY WITH MEALS 05/12/21   [name], MD  ?Multiple Vitamins-Minerals (CENTRUM SILVER ADULT 50+ PO) Take 1 tablet by mouth daily.    [provider]  ?Telmisartan-amLODIPine 40-10 MG TABS Take 1 tablet by mouth at bedtime. 09/16/21   [name], MD  ? ? ?Physical Exam: ?Vitals:  ? 10/06/21 0759 10/06/21 0830 10/06/21 0845 10/06/21 0909  ?BP:  (!) 129/95    ?Pulse:  81 86   ?Resp:  (!) 21 18   ?Temp: 98.6 ?F (37 ?C)     ?TempSrc: Oral     ?SpO2:  98% 99%   ?Weight:    86.4 kg  ?Height:    5' 8"  (1.727 m)  ? ?General: Not in acute distress ?HEENT: ?      Eyes: PERRL, EOMI, no scleral icterus. ?      ENT: No discharge from the ears and nose, no pharynx injection, no tonsillar enlargement.  ?      Neck: No JVD, no bruit, no mass felt. ?Heme: No neck lymph node enlargement. ?Cardiac: S1/S2, RRR, No murmurs, No gallops or rubs. ?Respiratory: No rales, wheezing, rhonchi or rubs. ?GI: Soft, nondistended, nontender, no rebound pain, no organomegaly, BS present. ?GU: No hematuria ?Ext: No pitting leg edema bilaterally. 1+DP/PT pulse bilaterally. ?Musculoskeletal: No joint deformities, No joint redness or warmth, no limitation of ROM in spin. ?Skin: No rashes.  ?Neuro: Alert, oriented X3, cranial nerves II-XII grossly intact, moves all extremities normally. Muscle strength 5/5 in all extremities,  sensation to light touch intact.  ?Psych: Patient is not psychotic, no suicidal or hemocidal ideation. ? ?Labs on Admission: I have personally reviewed following labs and imaging studies ? ?CBC: ?Recent Labs  ?Lab 10/06/21 ?0823  ?WBC 7.0  ?NEUTROABS 4.5  ?HGB 12.8*  ?HCT 41.0  ?MCV 88.9  ?PLT 187  ? ?Basic Metabolic Panel: ?Recent Labs  ?Lab 10/06/21 ?0823  ?NA 136  ?K 3.9  ?CL 100  ?CO2 27  ?GLUCOSE 144*  ?BUN 14  ?CREATININE 0.95  ?CALCIUM 9.0  ? ?GFR: ?Estimated Creatinine Clearance: 77.4 mL/min (by C-G formula based on SCr of 0.95 mg/dL). ?Liver Function Tests: ?Recent Labs  ?Lab 10/06/21 ?  0823  ?AST 39  ?ALT 50*  ?ALKPHOS 57  ?BILITOT 0.9  ?PROT 7.1  ?ALBUMIN 3.7  ? ?No results for input(s): LIPASE, AMYLASE in the last 168 hours. ?No results for input(s): AMMONIA in the last 168 hours. ?Coagulation Profile: ?Recent Labs  ?Lab 10/06/21 ?0823  ?INR 0.9  ? ?Cardiac Enzymes: ?No results for input(s): CKTOTAL, CKMB, CKMBINDEX, TROPONINI in the last 168 hours. ?BNP (last 3 results) ?No results for input(s): PROBNP in the last 8760 hours. ?HbA1C: ?No results for input(s): HGBA1C in the last 72 hours. ?CBG: ?Recent Labs  ?Lab 10/06/21 ?1222  ?GLUCAP 136*  ? ?Lipid Profile: ?No results for input(s): CHOL, HDL, LDLCALC, TRIG, CHOLHDL, LDLDIRECT in the last 72 hours. ?Thyroid Function Tests: ?No results for input(s): TSH, T4TOTAL, FREET4, T3FREE, THYROIDAB in the last 72 hours. ?Anemia Panel: ?No results for input(s): VITAMINB12, FOLATE, FERRITIN, TIBC, IRON, RETICCTPCT in the last 72 hours. ?Urine analysis: ?   ?Component Value Date/Time  ? APPEARANCEUR Clear 08/05/2021 1045  ? GLUCOSEU Negative 08/05/2021 1045  ? BILIRUBINUR Negative 08/05/2021 1045  ? KETONESUR trace (5) (A) 03/25/2020 1812  ? PROTEINUR Negative 08/05/2021 1045  ? UROBILINOGEN 1.0 03/25/2020 1812  ? NITRITE Negative 08/05/2021 1045  ? LEUKOCYTESUR Negative 08/05/2021 1045  ? ?Sepsis Labs: ?@[email or web address] (procalcitonin:4,lacticidven:4) ?)No results found  for this or any previous visit (from the past 240 hour(s)).  ? ?Radiological Exams on Admission: ?CT ANGIO HEAD NECK W WO CM ? ?Result Date: 10/06/2021 ?CLINICAL DATA:  Provided history: Neuro deficit, acute, stroke suspected. A

## 2021-10-06 NOTE — Progress Notes (Signed)
PT Cancellation Note ? ?Patient Details ?Name: Robert Robertson. ?MRN: [id number] ?DOB: [birth year] ? ? ?Cancelled Treatment:    Reason Eval/Treat Not Completed: Other (comment);Patient declined, no reason specified. Orders received and chart reviewed. Upon entry to room, MRI present to take pt for imaging. Per pt and RN, pt has been independent in transfers, gait, and toileting in ED. Pt declining need for PT assessment. PT to d/c orders. Please re-consult if change in mobility. ? ? ?Salem Caster. Fairly IV, PT, DPT ?Physical Therapist- [place]  ?[facility] ?10/06/2021, 1:16 PM ?

## 2021-10-07 ENCOUNTER — Telehealth: Payer: Self-pay | Admitting: Nurse Practitioner

## 2021-10-07 ENCOUNTER — Other Ambulatory Visit: Payer: Self-pay | Admitting: Nurse Practitioner

## 2021-10-07 DIAGNOSIS — R200 Anesthesia of skin: Secondary | ICD-10-CM

## 2021-10-07 DIAGNOSIS — I639 Cerebral infarction, unspecified: Secondary | ICD-10-CM | POA: Diagnosis not present

## 2021-10-07 LAB — HIV ANTIBODY (ROUTINE TESTING W REFLEX): HIV Screen 4th Generation wRfx: NONREACTIVE

## 2021-10-07 LAB — GLUCOSE, CAPILLARY
Glucose-Capillary: 153 mg/dL — ABNORMAL HIGH (ref 70–99)
Glucose-Capillary: 139 mg/dL — ABNORMAL HIGH (ref 70–99)

## 2021-10-07 MED ORDER — CLOPIDOGREL BISULFATE 75 MG PO TABS
75.0000 mg | ORAL_TABLET | Freq: Every day | ORAL | 0 refills | Status: DC
Start: 2021-10-08 — End: 2021-10-27

## 2021-10-07 MED ORDER — ASPIRIN 81 MG PO TBEC: 81.0000 mg | DELAYED_RELEASE_TABLET | Freq: Every day | ORAL | 11 refills | Status: AC

## 2021-10-07 MED ORDER — THIAMINE HCL 100 MG PO TABS: 100.0000 mg | ORAL_TABLET | Freq: Every day | ORAL | 1 refills | Status: DC

## 2021-10-07 MED ORDER — FOLIC ACID 1 MG PO TABS: 1.0000 mg | ORAL_TABLET | Freq: Every day | ORAL | 1 refills | Status: DC

## 2021-10-07 NOTE — Care Management (Signed)
?  Transition of Care (TOC) Screening Note ? ? ?Patient Details  ?Name: Robert Robertson. ?Date of Birth: 17-Feb-1951 ? ? ?Transition of Care (TOC) CM/SW Contact:    ?Pete Pelt, RN ?Phone Number: ?10/07/2021, 10:20 AM ? ? ? ?Transition of Care Department [facility]) has reviewed patient and no TOC needs have been identified at this time. We will continue to monitor patient advancement through interdisciplinary progression rounds. If new patient transition needs arise, please place a TOC consult. ?  ?

## 2021-10-07 NOTE — Telephone Encounter (Signed)
Scheduled

## 2021-10-07 NOTE — Telephone Encounter (Addendum)
Reviewed zio order placed in Epic and it was entered correctly. ?Msg fwd to scheduling to call the pt to schedule a new pt appt. ? ? ?

## 2021-10-07 NOTE — Telephone Encounter (Signed)
Hey guys,  ? ?[facility] staff wasn't available to place a zio on this guy before he went home. I just entered an order for [place] to place via home enrollment (hope that's right).  Would you pls help to get him his zio?  ? ?He'll also need to establish as new pt in about a month.  ? ?Thanks,  ? ?Gerald Stabs  ?

## 2021-10-07 NOTE — Plan of Care (Signed)
Neurology Results Review and Plan of Care ? ?MRI brain wo ? ?Punctate acute infarction at the left frontoparietal vertex, ?probably affecting the post central gyrus. ?  ?Old cortical and subcortical infarction at the left posterior ?temporoparietal junction. ?  ?Few other tiny old cortical infarctions in the left frontal and ?parietal regions. ? ?MRI c spine wo ? ?1. Advanced cervical facet arthrosis with mild-to-moderate right ?neural foraminal stenosis at C3-4 and mild right neural foraminal ?stenosis at C2-3. ?2. No spinal stenosis. ? ?CNS imaging personally reviewed; I agree with above interpretations ? ?CTA neck: ?  ?1. The common carotid, internal carotid and vertebral arteries are ?patent within the neck without stenosis or significant ?atherosclerotic disease. ?2.  Aortic Atherosclerosis (ICD10-I70.0). ?  ?CTA head: ?  ?No intracranial large vessel occlusion or proximal high-grade ?arterial stenosis. ? ?TTE - no intracardiac clot, (+) for interatrial shunt ? ?Recommendations: Stroke workup completed, patient OK for discharge from neuro standpoint. Please discharge on ASA '81mg'$  daily + plavix '75mg'$  daily x21 days f/b ASA '81mg'$  daily monotherapy after that + atorvastatin '80mg'$  daily. Given his age and cerebrovascular risk factors, no specific further workup indicated at this time regarding PFO; cardiology will follow this up in clinic and also arrange for zio patch. I will arrange outpatient neurology f/u. ? ?Su Monks, MD ?[facility] ?[phone] ? ?If 7pm- 7am, please page neurology on call as listed in [place]. ? ?

## 2021-10-07 NOTE — Discharge Summary (Signed)
?Physician Discharge Summary ?  ?Patient: Robert Robertson. MRN: [id number] DOB: 1951-05-28  ?Admit date:     10/06/2021  ?Discharge date: 10/07/21  ?Discharge Physician: [name]  ? ?PCP: Robert Mc, MD  ? ?Recommendations at discharge:  ?ZIO monitor was ordered-please ensure proper usage and cardiology follow-up ?Follow-up with cardiology to discuss possible PFO ?Follow-up with primary care provider ?Follow-up with neurology in 1 month ? ?Discharge Diagnoses: ?Principal Problem: ?  Right arm numbness ?Active Problems: ?  Essential hypertension ?  DM type 2 with diabetic mixed hyperlipidemia ([place]) ?  HLD (hyperlipidemia) ?  Alcohol use ? ? ?Hospital Course: ?Kiwan [place]. is a 71 y.o. male with medical history significant of HTN, HLD, DM, former smoker, who presents with right arm numbness. ?  ?  ?Pt states that he woke up normal at about 4:30 AM. At about 7:30 AM, he suddenly start feeling in right arm numb from shoulder to fingers.  No weakness to extremities.  No facial droop or slurred speech.  Patient states that he occasionally has milder right arm numbness upon awakening which he previously contributed to sleeping on it wrong.  Pt dose not have neck pain and no history of trauma. Patient denies chest pain, cough, shortness of breath.  No nausea, vomiting, diarrhea or abdominal pain.  No symptoms of UTI.  No fever or chills.  At the time of admission his symptoms were completely resolved. ? ?On arrival he was hemodynamically stable.  CT head was negative for any acute infarct but did show remote left parieto-occipital infarcts.  MRI was positive for a punctate acute infarction at the left frontal parietal vertex.  Please see the report ? ?MRI brain wo ?  ?Punctate acute infarction at the left frontoparietal vertex, ?probably affecting the post central gyrus. ?  ?Old cortical and subcortical infarction at the left posterior ?temporoparietal junction. ?  ?Few other tiny old cortical  infarctions in the left frontal and ?parietal regions. ?  ?MRI c spine wo ?  ?1. Advanced cervical facet arthrosis with mild-to-moderate right ?neural foraminal stenosis at C3-4 and mild right neural foraminal ?stenosis at C2-3. ?2. No spinal stenosis. ?  ?CNS imaging personally reviewed; I agree with above interpretations ?  ?CTA neck: ?  ?1. The common carotid, internal carotid and vertebral arteries are ?patent within the neck without stenosis or significant ?atherosclerotic disease. ?2.  Aortic Atherosclerosis (ICD10-I70.0). ?  ?CTA head: ?  ?No intracranial large vessel occlusion or proximal high-grade ?arterial stenosis. ?  ?TTE - no intracardiac clot, (+) for interatrial shunt ? ?A1c of 7.  Patient refused to work with PT and OT stating that he is at his baseline. ? ?Neurology recommended to go on DAPT with aspirin and Plavix for 3 weeks followed by aspirin only.  He will continue his high intensity statin.  We discussed with cardiology and he will follow-up with them as an outpatient for further evaluation and possible intervention if needed for his PFO.  A ZIO monitor was also ordered. ? ?Patient was also placed on CIWA protocol for his history of alcohol use.  Counseling was provided. ? ?He will continue current medications and will follow-up with his providers. ?  ? ?Consultants: Neurology, cardiology ?Procedures performed: None ?Disposition: Home ?Diet recommendation:  ?Discharge Diet Orders (From admission, onward)  ? ?  Start     Ordered  ? 10/07/21 0000  Diet - low sodium heart healthy       ? 10/07/21  1204  ? ?  ?  ? ?  ? ?Cardiac and Carb modified diet ?DISCHARGE MEDICATION: ?Allergies as of 10/07/2021   ? ?   Reactions  ? Pollen Extract Other (See Comments)  ? Hay-fever. Reaction: Sneezing  ? ?  ? ?  ?Medication List  ?  ? ?TAKE these medications   ? ?aspirin 81 MG EC tablet ?Take 1 tablet (81 mg total) by mouth daily. Swallow whole. ?Start taking on: October 08, 2021 ?  ?atorvastatin 80 MG  tablet ?Commonly known as: LIPITOR ?TAKE 1 TABLET DAILY ?  ?blood glucose meter kit and supplies ?USE TO CHECK BLOOD SUGARS UP TO TWO TIMES DAILY ?  ?CENTRUM SILVER ADULT 50+ PO ?Take 1 tablet by mouth daily. ?  ?clopidogrel 75 MG tablet ?Commonly known as: PLAVIX ?Take 1 tablet (75 mg total) by mouth daily for 21 days. ?Start taking on: October 08, 6293 ?  ?folic acid 1 MG tablet ?Commonly known as: FOLVITE ?Take 1 tablet (1 mg total) by mouth daily. ?Start taking on: October 08, 2021 ?  ?FreeStyle Freedom Lite w/Device Kit ?1 application by Does not apply route 2 (two) times daily. ?  ?freestyle lancets ?USE TO CHECK SUGAR TWICE A DAY ?  ?FREESTYLE LITE test strip ?Generic drug: glucose blood ?USE TO CHECK SUGARS TWICE A DAY ?  ?hydrochlorothiazide 25 MG tablet ?Commonly known as: HYDRODIURIL ?TAKE 1 TABLET DAILY ?  ?Januvia 100 MG tablet ?Generic drug: sitaGLIPtin ?TAKE 1 TABLET DAILY ?What changed: how much to take ?  ?Krill Oil 300 MG Caps ?Take 300 mg by mouth daily. ?  ?metFORMIN 1000 MG tablet ?Commonly known as: GLUCOPHAGE ?TAKE 1 TABLET TWICE A DAY WITH MEALS ?  ?Telmisartan-amLODIPine 40-10 MG Tabs ?Take 1 tablet by mouth at bedtime. ?  ?thiamine 100 MG tablet ?Take 1 tablet (100 mg total) by mouth daily. ?Start taking on: October 08, 2021 ?  ? ?  ? ? Follow-up Information   ? ? Robert Mc, MD. Schedule an appointment as soon as possible for a visit in 1 week(s).   ?Specialty: Internal Medicine ?Contact information: ?[address] ?[address] ?[place] [address] ?[phone] ? ? ?  ?  ? ? [name], MD. Schedule an appointment as soon as possible for a visit in 2 week(s).   ?Specialties: Cardiology, Radiology ?Contact information: ?[place] [address] ?[phone] ? ? ?  ?  ? ?  ?  ? ?  ? ?Discharge Exam: ?Filed Weights  ? 10/06/21 0909  ?Weight: 86.4 kg  ? ?General.     In no acute distress. ?Pulmonary.  Lungs clear bilaterally, normal respiratory effort. ?CV.   Regular rate and rhythm, no JVD, rub or murmur. ?Abdomen.  Soft, nontender, nondistended, BS positive. ?CNS.  Alert and oriented x3.  No focal neurologic deficit. ?Extremities.  No edema, no cyanosis, pulses intact and symmetrical. ?Psychiatry.  Judgment and insight appears normal.  ? ?Condition at discharge: stable ? ?The results of significant diagnostics from this hospitalization (including imaging, microbiology, ancillary and laboratory) are listed below for reference.  ? ?Imaging Studies: ?CT ANGIO HEAD NECK W WO CM ? ?Result Date: 10/06/2021 ?CLINICAL DATA:  Provided history: Neuro deficit, acute, stroke suspected. Additional history provided: Paresthesia of right arm. EXAM: CT ANGIOGRAPHY HEAD AND NECK TECHNIQUE: Multidetector CT imaging of the head and neck was performed using the standard protocol during bolus administration of intravenous contrast. Multiplanar CT image reconstructions and MIPs were obtained to evaluate the  vascular anatomy. Carotid stenosis measurements (when applicable) are obtained utilizing NASCET criteria, using the distal internal carotid diameter as the denominator. RADIATION DOSE REDUCTION: This exam was performed according to the departmental dose-optimization program which includes automated exposure control, adjustment of the mA and/or kV according to patient size and/or use of iterative reconstruction technique. CONTRAST:  146m OMNIPAQUE IOHEXOL 350 MG/ML SOLN COMPARISON:  Noncontrast head CT performed earlier today 10/06/2021. FINDINGS: CTA NECK FINDINGS Aortic arch: Standard aortic branching. Mild calcified plaque within the visualized aortic arch. Streak and beam hardening artifact arising from a dense right-sided contrast bolus partially obscures the right subclavian artery. Within this limitation, there is no appreciable hemodynamically significant innominate or proximal subclavian artery stenosis. Right carotid system: CCA and ICA patent within neck without stenosis or  significant atherosclerotic disease. Partially retropharyngeal course of the cervical ICA. Left carotid system: CCA and ICA patent within the neck without stenosis or significant atherosclerotic disease. Partially retropharyng

## 2021-10-08 ENCOUNTER — Encounter: Payer: Self-pay | Admitting: Internal Medicine

## 2021-10-08 ENCOUNTER — Emergency Department: Payer: Medicare PPO

## 2021-10-08 ENCOUNTER — Ambulatory Visit (INDEPENDENT_AMBULATORY_CARE_PROVIDER_SITE_OTHER): Payer: Medicare PPO | Admitting: Internal Medicine

## 2021-10-08 ENCOUNTER — Observation Stay
Admission: EM | Admit: 2021-10-08 | Discharge: 2021-10-09 | Disposition: A | Discharge status: Home / Self Care | Payer: Medicare PPO | Attending: Internal Medicine | Admitting: Internal Medicine

## 2021-10-08 ENCOUNTER — Other Ambulatory Visit: Payer: Self-pay

## 2021-10-08 VITALS — BP 134/80 | HR 103 | Temp 98.0°F | Ht 68.0 in | Wt 191.0 lb

## 2021-10-08 DIAGNOSIS — R200 Anesthesia of skin: Principal | ICD-10-CM | POA: Insufficient documentation

## 2021-10-08 DIAGNOSIS — E1169 Type 2 diabetes mellitus with other specified complication: Secondary | ICD-10-CM

## 2021-10-08 DIAGNOSIS — Z87891 Personal history of nicotine dependence: Secondary | ICD-10-CM | POA: Insufficient documentation

## 2021-10-08 DIAGNOSIS — I679 Cerebrovascular disease, unspecified: Secondary | ICD-10-CM | POA: Diagnosis not present

## 2021-10-08 DIAGNOSIS — G459 Transient cerebral ischemic attack, unspecified: Secondary | ICD-10-CM

## 2021-10-08 DIAGNOSIS — Z79899 Other long term (current) drug therapy: Secondary | ICD-10-CM | POA: Diagnosis not present

## 2021-10-08 DIAGNOSIS — Z20822 Contact with and (suspected) exposure to covid-19: Secondary | ICD-10-CM | POA: Diagnosis not present

## 2021-10-08 DIAGNOSIS — E782 Mixed hyperlipidemia: Secondary | ICD-10-CM | POA: Diagnosis not present

## 2021-10-08 DIAGNOSIS — R42 Dizziness and giddiness: Secondary | ICD-10-CM | POA: Diagnosis not present

## 2021-10-08 DIAGNOSIS — K760 Fatty (change of) liver, not elsewhere classified: Secondary | ICD-10-CM | POA: Diagnosis not present

## 2021-10-08 DIAGNOSIS — Z7902 Long term (current) use of antithrombotics/antiplatelets: Secondary | ICD-10-CM | POA: Insufficient documentation

## 2021-10-08 DIAGNOSIS — Q2112 Patent foramen ovale: Secondary | ICD-10-CM | POA: Diagnosis not present

## 2021-10-08 DIAGNOSIS — R202 Paresthesia of skin: Secondary | ICD-10-CM

## 2021-10-08 DIAGNOSIS — I10 Essential (primary) hypertension: Secondary | ICD-10-CM | POA: Diagnosis not present

## 2021-10-08 DIAGNOSIS — J439 Emphysema, unspecified: Secondary | ICD-10-CM | POA: Insufficient documentation

## 2021-10-08 DIAGNOSIS — Z7984 Long term (current) use of oral hypoglycemic drugs: Secondary | ICD-10-CM | POA: Diagnosis not present

## 2021-10-08 DIAGNOSIS — R29818 Other symptoms and signs involving the nervous system: Secondary | ICD-10-CM | POA: Diagnosis not present

## 2021-10-08 DIAGNOSIS — E785 Hyperlipidemia, unspecified: Secondary | ICD-10-CM | POA: Diagnosis present

## 2021-10-08 DIAGNOSIS — I639 Cerebral infarction, unspecified: Secondary | ICD-10-CM | POA: Diagnosis not present

## 2021-10-08 DIAGNOSIS — Z8673 Personal history of transient ischemic attack (TIA), and cerebral infarction without residual deficits: Secondary | ICD-10-CM | POA: Diagnosis not present

## 2021-10-08 DIAGNOSIS — R7401 Elevation of levels of liver transaminase levels: Secondary | ICD-10-CM | POA: Insufficient documentation

## 2021-10-08 DIAGNOSIS — Z7982 Long term (current) use of aspirin: Secondary | ICD-10-CM | POA: Diagnosis not present

## 2021-10-08 DIAGNOSIS — Z09 Encounter for follow-up examination after completed treatment for conditions other than malignant neoplasm: Secondary | ICD-10-CM | POA: Insufficient documentation

## 2021-10-08 DIAGNOSIS — Z789 Other specified health status: Secondary | ICD-10-CM

## 2021-10-08 LAB — URINALYSIS, ROUTINE W REFLEX MICROSCOPIC
Bilirubin Urine: NEGATIVE
Glucose, UA: NEGATIVE mg/dL
Hgb urine dipstick: NEGATIVE
Ketones, ur: NEGATIVE mg/dL
Leukocytes,Ua: NEGATIVE
Nitrite: NEGATIVE
Protein, ur: NEGATIVE mg/dL
Specific Gravity, Urine: >1.046 — ABNORMAL HIGH (ref 1.005–1.030)
pH: 6 (ref 5.0–8.0)

## 2021-10-08 LAB — DIFFERENTIAL
Abs Immature Granulocytes: 0.03 10*3/uL (ref 0.00–0.07)
Basophils Absolute: 0 10*3/uL (ref 0.0–0.1)
Basophils Relative: 0 %
Eosinophils Absolute: 0.1 10*3/uL (ref 0.0–0.5)
Eosinophils Relative: 1 %
Immature Granulocytes: 0 %
Lymphocytes Relative: 22 %
Lymphs Abs: 1.7 10*3/uL (ref 0.7–4.0)
Monocytes Absolute: 0.7 10*3/uL (ref 0.1–1.0)
Monocytes Relative: 9 %
Neutro Abs: 5.1 10*3/uL (ref 1.7–7.7)
Neutrophils Relative %: 68 %

## 2021-10-08 LAB — RESP PANEL BY RT-PCR (FLU A&B, COVID) ARPGX2
Influenza A by PCR: NEGATIVE
Influenza B by PCR: NEGATIVE
SARS Coronavirus 2 by RT PCR: NEGATIVE

## 2021-10-08 LAB — APTT: aPTT: 28 seconds (ref 24–36)

## 2021-10-08 LAB — COMPREHENSIVE METABOLIC PANEL
ALT: 56 U/L — ABNORMAL HIGH (ref 0–44)
AST: 42 U/L — ABNORMAL HIGH (ref 15–41)
Albumin: 3.9 g/dL (ref 3.5–5.0)
Alkaline Phosphatase: 57 U/L (ref 38–126)
Anion gap: 8 (ref 5–15)
BUN: 14 mg/dL (ref 8–23)
CO2: 27 mmol/L (ref 22–32)
Calcium: 9.1 mg/dL (ref 8.9–10.3)
Chloride: 102 mmol/L (ref 98–111)
Creatinine, Ser: 0.8 mg/dL (ref 0.61–1.24)
GFR, Estimated: >60 mL/min (ref >60)
Glucose, Bld: 102 mg/dL — ABNORMAL HIGH (ref 70–99)
Potassium: 4 mmol/L (ref 3.5–5.1)
Sodium: 137 mmol/L (ref 135–145)
Total Bilirubin: 0.9 mg/dL (ref 0.3–1.2)
Total Protein: 7.4 g/dL (ref 6.5–8.1)

## 2021-10-08 LAB — URINE DRUG SCREEN, QUALITATIVE (ARMC ONLY)
Amphetamines, Ur Screen: NOT DETECTED
Barbiturates, Ur Screen: NOT DETECTED
Benzodiazepine, Ur Scrn: NOT DETECTED
Cannabinoid 50 Ng, Ur ~~LOC~~: NOT DETECTED
Cocaine Metabolite,Ur ~~LOC~~: NOT DETECTED
MDMA (Ecstasy)Ur Screen: NOT DETECTED
Methadone Scn, Ur: NOT DETECTED
Opiate, Ur Screen: NOT DETECTED
Phencyclidine (PCP) Ur S: NOT DETECTED
Tricyclic, Ur Screen: NOT DETECTED

## 2021-10-08 LAB — CBG MONITORING, ED
Glucose-Capillary: 102 mg/dL — ABNORMAL HIGH (ref 70–99)
Glucose-Capillary: 90 mg/dL (ref 70–99)
Glucose-Capillary: 207 mg/dL — ABNORMAL HIGH (ref 70–99)

## 2021-10-08 LAB — CBC
HCT: 41.3 % (ref 39.0–52.0)
Hemoglobin: 12.8 g/dL — ABNORMAL LOW (ref 13.0–17.0)
MCH: 27.8 pg (ref 26.0–34.0)
MCHC: 31 g/dL (ref 30.0–36.0)
MCV: 89.8 fL (ref 80.0–100.0)
Platelets: 173 10*3/uL (ref 150–400)
RBC: 4.6 MIL/uL (ref 4.22–5.81)
RDW: 13.5 % (ref 11.5–15.5)
WBC: 7.6 10*3/uL (ref 4.0–10.5)
nRBC: 0 % (ref 0.0–0.2)

## 2021-10-08 LAB — PROTIME-INR
INR: 1 (ref 0.8–1.2)
Prothrombin Time: 12.9 seconds (ref 11.4–15.2)

## 2021-10-08 LAB — TROPONIN I (HIGH SENSITIVITY)
Troponin I (High Sensitivity): 6 ng/L (ref <18)
Troponin I (High Sensitivity): 6 ng/L (ref <18)

## 2021-10-08 LAB — ETHANOL: Alcohol, Ethyl (B): <10 mg/dL (ref <10)

## 2021-10-08 LAB — BRAIN NATRIURETIC PEPTIDE: B Natriuretic Peptide: 9.8 pg/mL (ref 0.0–100.0)

## 2021-10-08 IMAGING — MR MR HEAD W/O CM
11 series · 47 of 48 positions shown · non-contrast
Comparison: [DATE]

CLINICAL DATA: Neuro deficit, acute, stroke suspected R face
numbness. Admitted for L insula stroke last week

EXAM:
MRI HEAD WITHOUT CONTRAST
TECHNIQUE: Multiplanar, multiecho pulse sequences of the brain and surrounding
structures were obtained without intravenous contrast.

[Series 5: ax dwi_tracew · axial · 3.0mm · 0.65mm/px · z∈[-120,+33]mm · 4 of 48 slices shown]
[im 1/48]
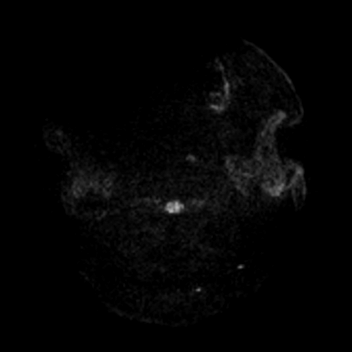
[im 16/48]
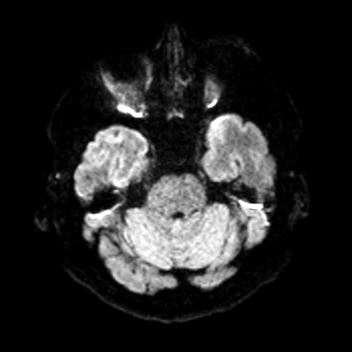
[im 32/48]
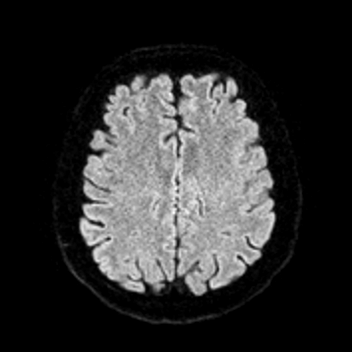
[im 48/48]
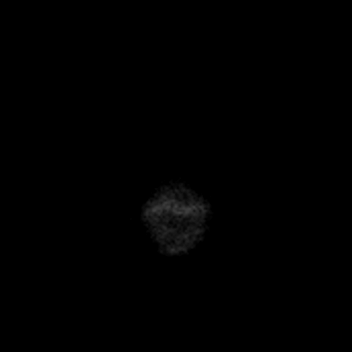

[Series 6: ax dwi_adc · axial · 3.0mm · 0.65mm/px · z∈[-120,+33]mm · 4 of 48 slices shown]
[im 1/48]
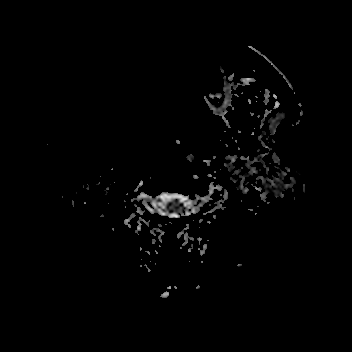
[im 16/48]
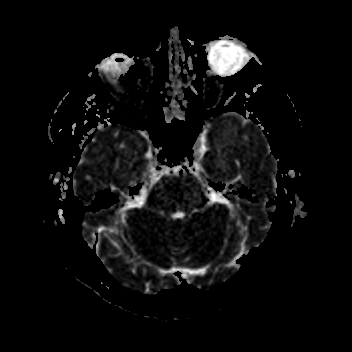
[im 32/48]
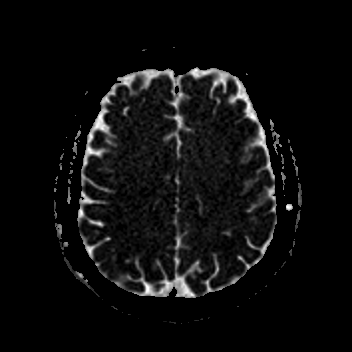
[im 48/48]
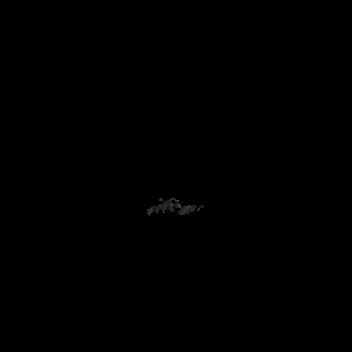

[Series 7: cor dwi_tracew · coronal · 5.0mm · 0.65mm/px · 3 of 40 slices shown]
[im 1/40]
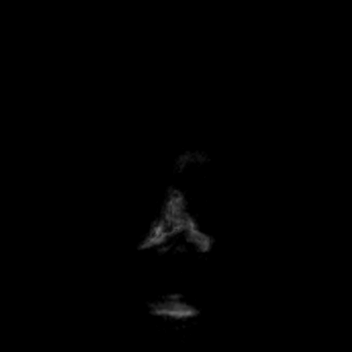
[im 20/40]
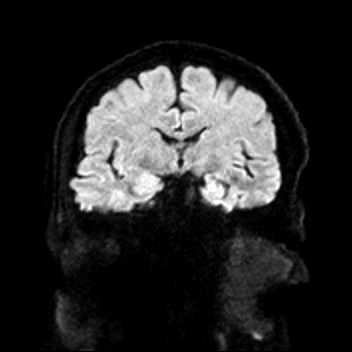
[im 40/40]
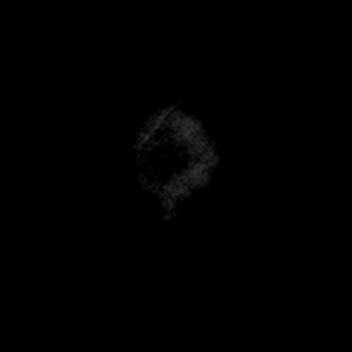

[Series 8: cor dwi_adc · coronal · 5.0mm · 0.65mm/px · 3 of 37 slices shown]
[im 1/37]
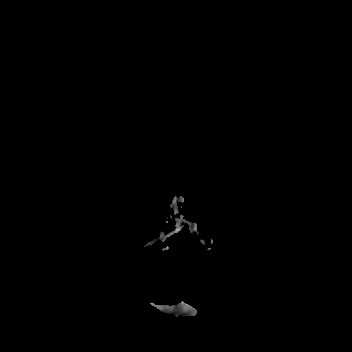
[im 19/37]
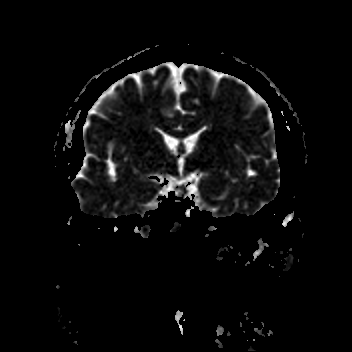
[im 37/37]
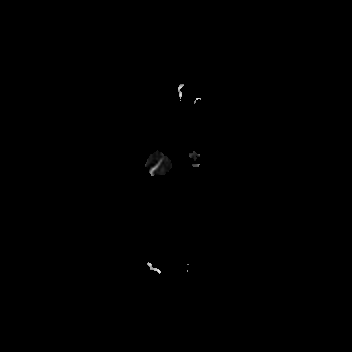

[Series 9: T1 · sagittal · 5.0mm · 0.62mm/px · 2 of 25 slices shown (1 of 2)]
[im 1/25]
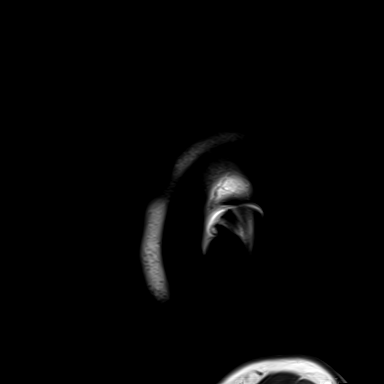
[im 25/25]
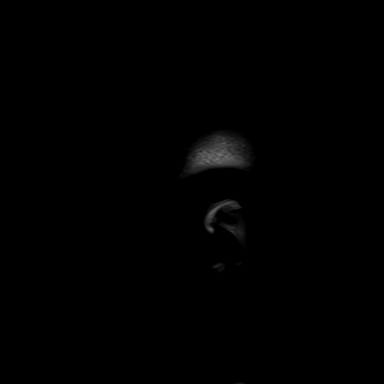

[Series 10: T2 · axial · 5.0mm · 0.53mm/px · z∈[-116,+32]mm · 2 of 26 slices shown (1 of 2)]
[im 1/26]
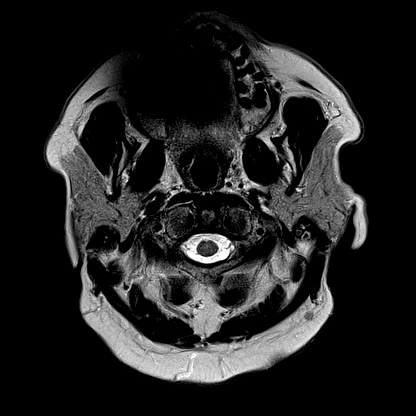
[im 26/26]
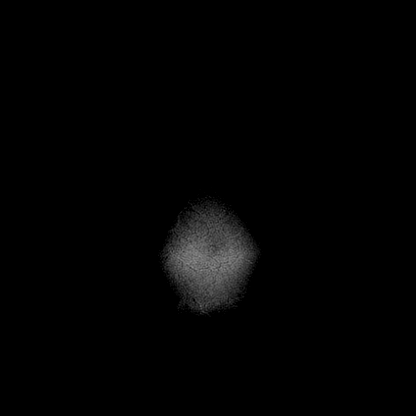

[Series 12: pha_images · axial · 3.0mm · 0.90mm/px · z∈[-130,+45]mm · 5 of 60 slices shown]
[im 1/60]
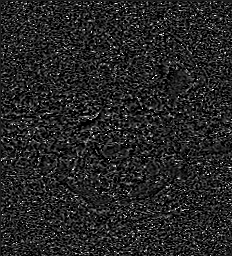
[im 15/60]
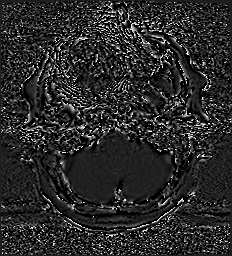
[im 30/60]
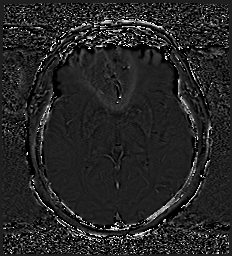
[im 45/60]
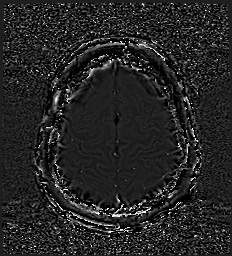
[im 60/60]
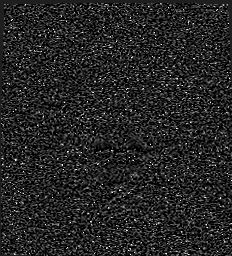

[Series 13: swi_images · axial · 3.0mm · 0.90mm/px · z∈[-130,+45]mm · 5 of 60 slices shown]
[im 1/60]
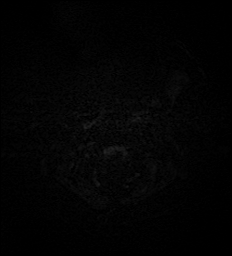
[im 15/60]
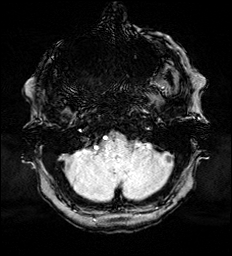
[im 30/60]
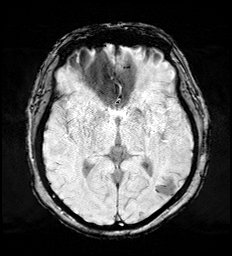
[im 45/60]
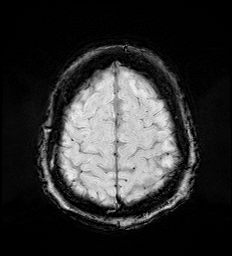
[im 60/60]
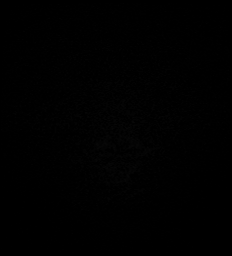

[Series 15: FLAIR · axial · 3.0mm · 0.53mm/px · z∈[-122,+38]mm · 4 of 55 slices shown]
[im 1/55]
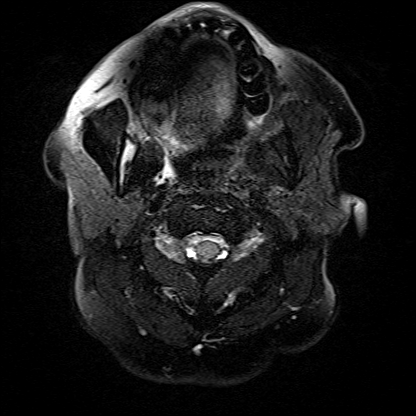
[im 19/55]
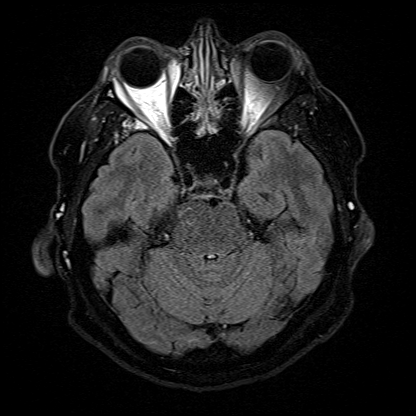
[im 37/55]
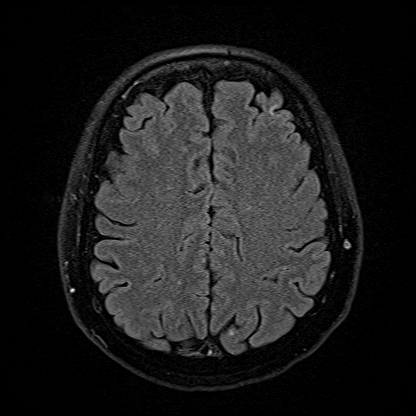
[im 55/55]
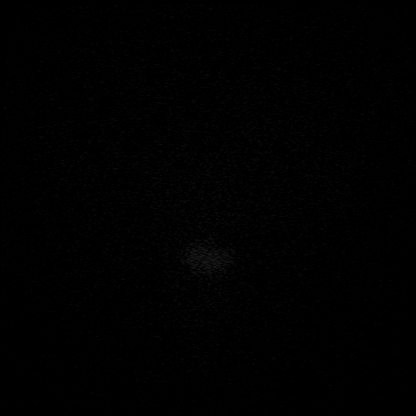

[Series 16: T1 · axial · 1.0mm · 0.98mm/px · z∈[-131,+41]mm · 13 of 174 slices shown (2 of 2)]
[im 1/174]
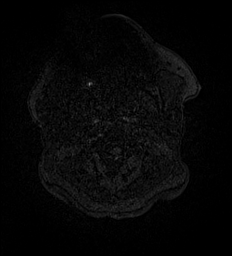
[im 14/174]
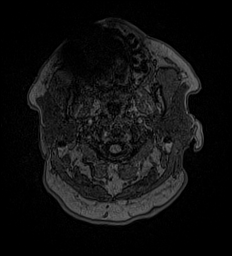
[im 27/174]
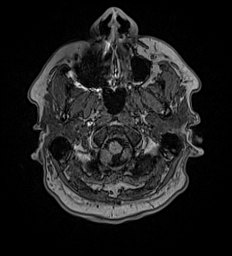
[im 40/174]
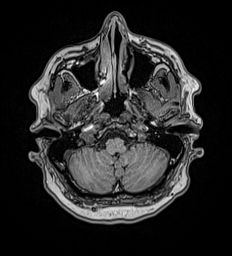
[im 54/174]
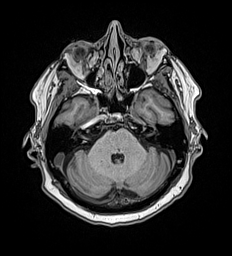
[im 67/174]
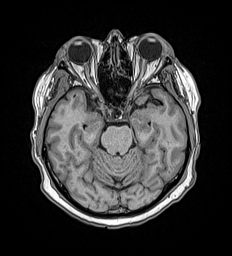
[im 80/174]
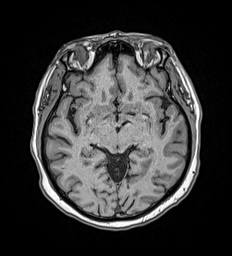
[im 94/174]
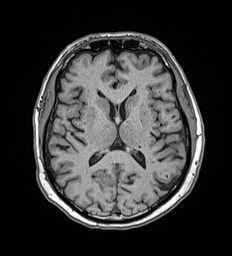
[im 107/174]
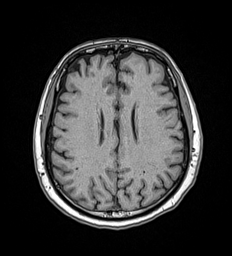
[im 120/174]
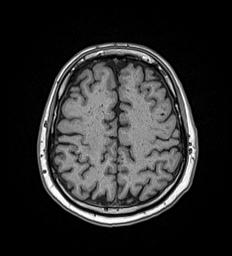
[im 134/174]
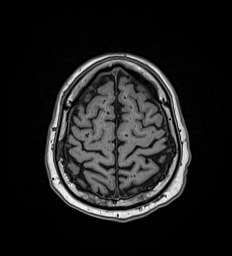
[im 147/174]
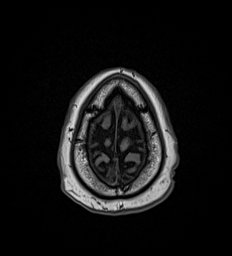
[im 174/174]
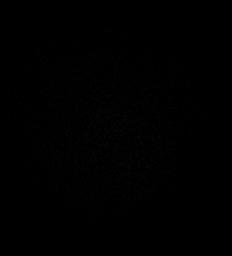

[Series 17: T2 · coronal · 5.0mm · 0.57mm/px · 2 of 29 slices shown (2 of 2)]
[im 1/29]
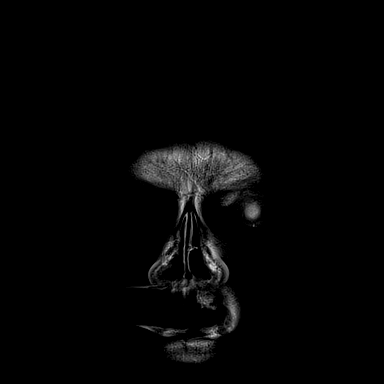
[im 29/29]
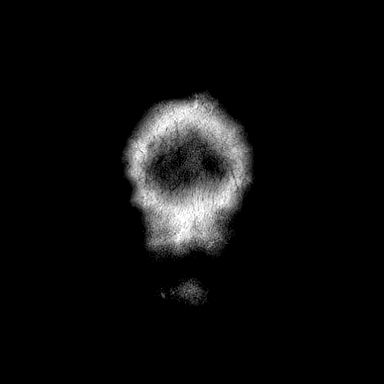

[47 of 48 positions shown; findings below may reference images not displayed]

FINDINGS: Brain: Small recent infarct of the left postcentral gyrus is again
identified and now more visible on T2 FLAIR. Size may be slightly
greater. There is no new infarct. Chronic left temporal-occipital
junction infarction. Additional chronic small left frontal and
parietal cortical infarcts. No evidence of hemorrhage. Ventricles
and sulci are stable in size and configuration. No extra-axial
collection.

Vascular: Major vessel flow voids at the skull base are preserved.

Skull and upper cervical spine: Normal marrow signal is preserved.

Sinuses/Orbits: Paranasal sinuses are aerated. Orbits are
unremarkable.

Other: Sella is partially empty.  Mastoid air cells are clear.
IMPRESSION: Small recent infarct of left postcentral gyrus again identified.
Size may be slightly greater. Otherwise no change from recent prior
study.

## 2021-10-08 IMAGING — CT CT ANGIO HEAD-NECK (W OR W/O PERF)
2 of 7 series · 8 of 33 positions shown · IV contrast (APPLIED)
Comparison: [DATE]

CLINICAL DATA: Neuro deficit, acute, stroke suspected

EXAM:
CT ANGIOGRAPHY HEAD AND NECK
TECHNIQUE: Multidetector CT imaging of the head and neck was performed using
the standard protocol during bolus administration of intravenous
contrast. Multiplanar CT image reconstructions and MIPs were
obtained to evaluate the vascular anatomy. Carotid stenosis
measurements (when applicable) are obtained utilizing NASCET
criteria, using the distal internal carotid diameter as the
denominator.

[Series 4: cta head neck · axial · 0.51mm/px · z∈[-128,-12]mm · 2 of 175 slices shown]
[im 59/175  soft-tissue]
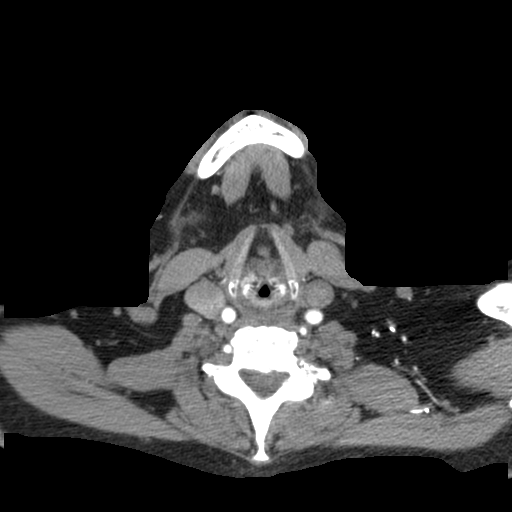
[im 117/175  soft-tissue]
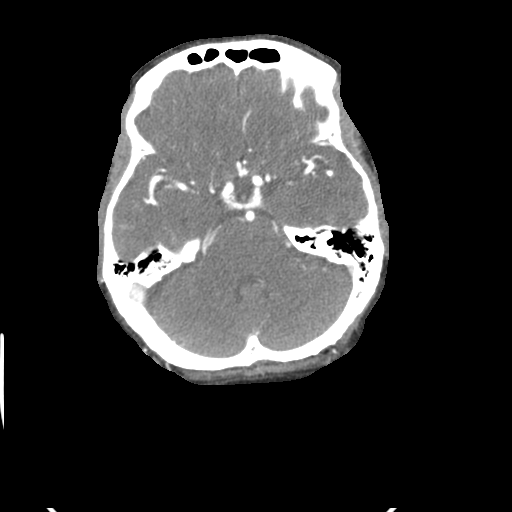

[Series 6: ax thin · axial · 0.42mm/px · z∈[-201,+43]mm · 6 of 342 slices shown]
[im 49/342  soft-tissue]
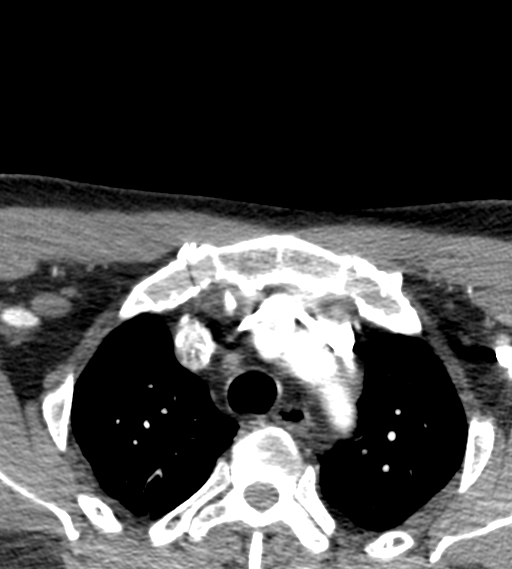
[im 98/342  bone]
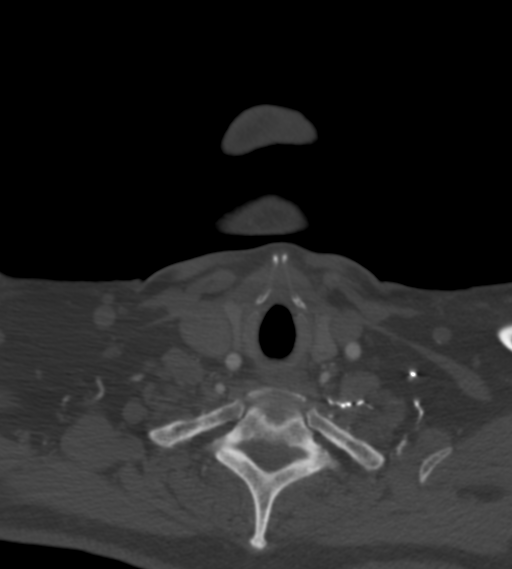
[im 147/342  soft-tissue]
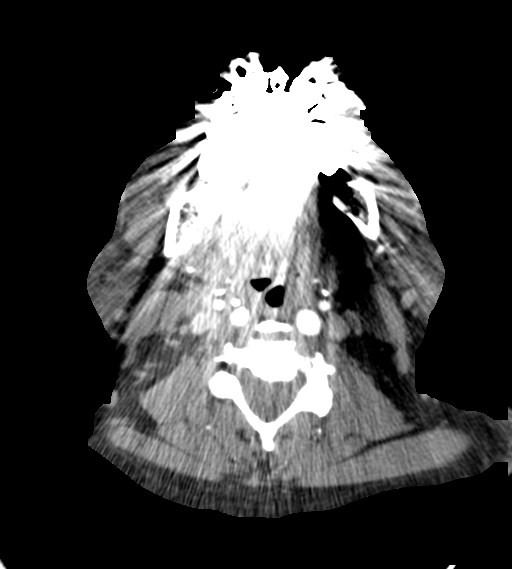
[im 195/342  bone]
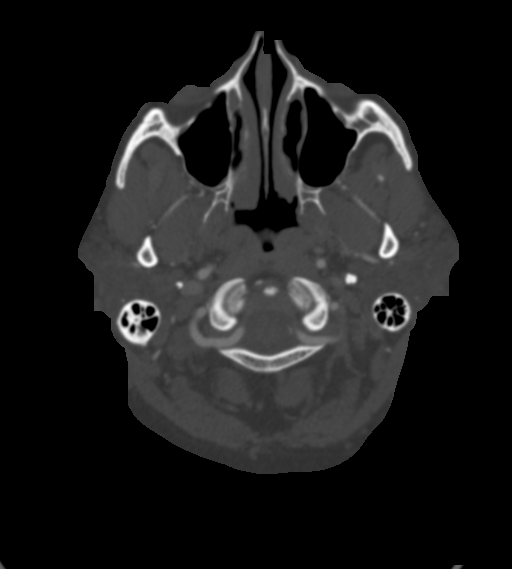
[im 244/342  soft-tissue]
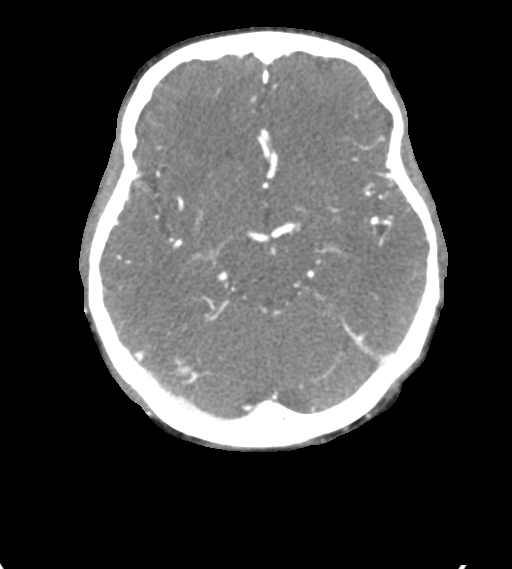
[im 293/342  bone]
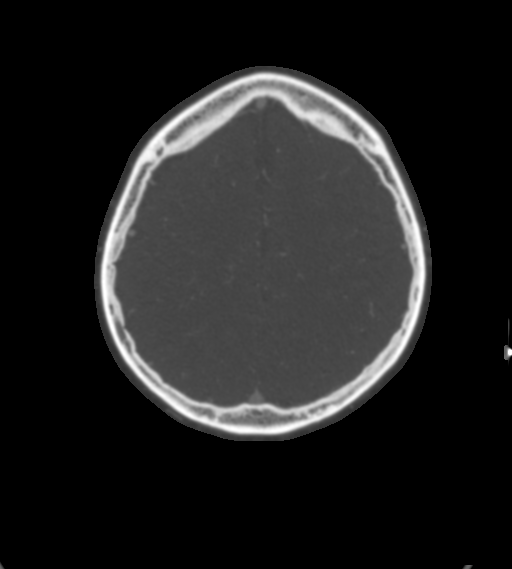

[8 of 33 positions shown; findings below may reference images not displayed]

RADIATION DOSE REDUCTION: This exam was performed according to the
departmental dose-optimization program which includes automated
exposure control, adjustment of the mA and/or kV according to
patient size and/or use of iterative reconstruction technique.

CONTRAST:  75mL OMNIPAQUE IOHEXOL 350 MG/ML SOLN
FINDINGS: CTA NECK

Aortic arch: Aortic arch is unremarkable. Great vessel origins are
patent.

Right carotid system: Patent. No stenosis. Partially retropharyngeal
course.

Left carotid system: Patent. No stenosis. Partially retropharyngeal
course.

Vertebral arteries: Patent and codominant.  No stenosis.

Skeleton: Degenerative changes of the included spine.

Other neck: Unremarkable.

Upper chest: No apical lung mass.

Review of the MIP images confirms the above findings

CTA HEAD

Anterior circulation: Intracranial internal carotid arteries patent.
Anterior and middle cerebral arteries are patent.

Posterior circulation: Intracranial vertebral arteries are patent.
Basilar artery is patent. Major cerebellar artery origins are
patent. Small posterior communicating arteries are present.
Posterior cerebral arteries are patent.

Venous sinuses: As permitted by contrast timing, patent.

Review of the MIP images confirms the above findings
IMPRESSION: No large vessel occlusion, hemodynamically significant stenosis, or
evidence of dissection.

## 2021-10-08 MED ORDER — CLOPIDOGREL BISULFATE 75 MG PO TABS
75.0000 mg | ORAL_TABLET | Freq: Every day | ORAL | Status: DC
  Administered 2021-10-09: 75 mg via ORAL
  Filled 2021-10-08: qty 1

## 2021-10-08 MED ORDER — ASPIRIN 81 MG PO TBEC: 81.0000 mg | DELAYED_RELEASE_TABLET | Freq: Every day | ORAL | Status: DC

## 2021-10-08 MED ORDER — IOHEXOL 350 MG/ML SOLN
75.0000 mL | Freq: Once | INTRAVENOUS | Status: AC | PRN
  Administered 2021-10-08: 75 mL via INTRAVENOUS

## 2021-10-08 MED ORDER — ASPIRIN 81 MG PO CHEW
81.0000 mg | CHEWABLE_TABLET | Freq: Every day | ORAL | Status: DC
  Administered 2021-10-09: 81 mg via ORAL
  Filled 2021-10-08: qty 1

## 2021-10-08 MED ORDER — INSULIN ASPART 100 UNIT/ML IJ SOLN
0.0000 [IU] | Freq: Three times a day (TID) | INTRAMUSCULAR | Status: DC
  Administered 2021-10-09: 2 [IU] via SUBCUTANEOUS
  Filled 2021-10-08: qty 1

## 2021-10-08 MED ORDER — CLOPIDOGREL BISULFATE 75 MG PO TABS: 75.0000 mg | ORAL_TABLET | Freq: Every day | ORAL | Status: DC

## 2021-10-08 MED ORDER — CLOPIDOGREL BISULFATE 75 MG PO TABS
75.0000 mg | ORAL_TABLET | Freq: Every day | ORAL | Status: DC
  Filled 2021-10-08: qty 1

## 2021-10-08 MED ORDER — STROKE: EARLY STAGES OF RECOVERY BOOK: Freq: Once | Status: DC

## 2021-10-08 MED ORDER — ATORVASTATIN CALCIUM 20 MG PO TABS
80.0000 mg | ORAL_TABLET | Freq: Every day | ORAL | Status: DC
  Administered 2021-10-09: 80 mg via ORAL
  Filled 2021-10-08 (×2): qty 4

## 2021-10-08 NOTE — Consult Note (Signed)
NEUROLOGY TELECONSULTATION NOTE  ? ?Date of service: October 08, 2021 ?Patient Name: Robert Robertson. ?MRN:  [id number] ?DOB:  05-02-1951 ?Reason for consult: telestroke ? ?Requesting Provider: Dr. Hulan Saas ?Consult Participants: myself, patient, bedside RN, telestroke RN ?Location of the provider: [place], [place] ?Location of the patient: [place] ? ?This consult was provided via telemedicine with 2-way video and audio communication. The patient/family was informed that care would be provided in this way and agreed to receive care in this manner.  ? ?_ _ _   _ __   _ __ _ _  __ __   _ __   __ _ ? ?History of Present Illness  ? ?This is a 72 yo gentleman with hx HTN, DM, HL just discharged earlier this week after a small acute ischemic infarct L frontoparietal vertex who presents with acute onset R facial numbness, now resolving. LKW 1540. No other sx besides R facial numbness. He did not have facial numbness with his recent stroke. NIHSS = 1 for sensory deficit. CT head NAICP, known subacute and chronic infarcts (personal review). TNK not administered 2/2 contraindication of recent stroke. Exam not c/w LVO. ?  ?ROS  ? ?Per HPI; all other systems reviewed and are negative ? ?Past History  ? ?The following was personally reviewed: ? ?Past Medical History:  ?Diagnosis Date  ? Allergy   ? Colon polyp   ? Diabetes mellitus   ? Hyperlipidemia   ? Hypertension   ? ?Past Surgical History:  ?Procedure Laterality Date  ? COLONOSCOPY  2010  ? 10 mm adenomatous polyp in the descending colon without atypia, [name], MD  ? COLONOSCOPY WITH PROPOFOL N/A 07/18/2019  ? Procedure: COLONOSCOPY WITH PROPOFOL;  Surgeon: Robert Bellow, MD;  Location: [facility] ENDOSCOPY;  Service: Endoscopy;  Laterality: N/A;  ? ?Family History  ?Problem Relation Age of Onset  ? Diabetes Mother   ? Stroke Mother   ? Colon cancer Mother   ? Diabetes Brother   ? Diabetes Maternal Grandfather   ? Diabetes Maternal Grandmother   ? Cancer Sister   ?      brain tumor  ? ?Social History  ? ?Socioeconomic History  ? Marital status: Married  ?  Spouse name: Not on file  ? Number of children: Not on file  ? Years of education: Not on file  ? Highest education level: Not on file  ?Occupational History  ? Not on file  ?Tobacco Use  ? Smoking status: Former  ?  Packs/day: 0.50  ?  Years: 45.00  ?  Pack years: 22.50  ?  Types: Cigarettes  ?  Quit date: 2015  ?  Years since quitting: 8.2  ? Smokeless tobacco: Never  ?Vaping Use  ? Vaping Use: Never used  ?Substance and Sexual Activity  ? Alcohol use: Yes  ?  Alcohol/week: 14.0 standard drinks  ?  Types: 14 Glasses of wine per week  ?  Comment: daily  ? Drug use: No  ? Sexual activity: Not on file  ?Other Topics Concern  ? Not on file  ?Social History Narrative  ? Not on file  ? ?Social Determinants of Health  ? ?Financial Resource Strain: Low Risk   ? Difficulty of Paying Living Expenses: Not hard at all  ?Food Insecurity: No Food Insecurity  ? Worried About Charity fundraiser in the Last Year: Never true  ? Ran Out of Food in the Last Year: Never true  ?Transportation Needs:  No Transportation Needs  ? Lack of Transportation (Medical): No  ? Lack of Transportation (Non-Medical): No  ?Physical Activity: Not on file  ?Stress: No Stress Concern Present  ? Feeling of Stress : Not at all  ?Social Connections: Not on file  ? ?Allergies  ?Allergen Reactions  ? Pollen Extract Other (See Comments)  ?  Hay-fever. Reaction: Sneezing  ? ? ?Medications  ? ?(Not in a hospital admission) ?  ? ?No current facility-administered medications for this encounter. ? ?Current Outpatient Medications:  ?  aspirin EC 81 MG EC tablet, Take 1 tablet (81 mg total) by mouth daily. Swallow whole., Disp: 30 tablet, Rfl: 11 ?  atorvastatin (LIPITOR) 80 MG tablet, TAKE 1 TABLET DAILY (Patient taking differently: Take 80 mg by mouth daily.), Disp: 90 tablet, Rfl: 3 ?  blood glucose meter kit and supplies, USE TO CHECK BLOOD SUGARS UP TO TWO TIMES DAILY,  Disp: 1 each, Rfl: 0 ?  Blood Glucose Monitoring Suppl (FREESTYLE FREEDOM LITE) w/Device KIT, 1 application by Does not apply route 2 (two) times daily., Disp: 1 kit, Rfl: 0 ?  clopidogrel (PLAVIX) 75 MG tablet, Take 1 tablet (75 mg total) by mouth daily for 21 days., Disp: 21 tablet, Rfl: 0 ?  folic acid (FOLVITE) 1 MG tablet, Take 1 tablet (1 mg total) by mouth daily., Disp: 90 tablet, Rfl: 1 ?  glucose blood (FREESTYLE LITE) test strip, USE TO CHECK SUGARS TWICE A DAY, Disp: 200 each, Rfl: 1 ?  hydrochlorothiazide (HYDRODIURIL) 25 MG tablet, TAKE 1 TABLET DAILY (Patient taking differently: Take 25 mg by mouth daily.), Disp: 90 tablet, Rfl: 3 ?  JANUVIA 100 MG tablet, TAKE 1 TABLET DAILY (Patient taking differently: Take 100 mg by mouth daily.), Disp: 90 tablet, Rfl: 3 ?  Krill Oil 300 MG CAPS, Take 300 mg by mouth daily., Disp: , Rfl:  ?  Lancets (FREESTYLE) lancets, USE TO CHECK SUGAR TWICE A DAY, Disp: 200 each, Rfl: 3 ?  metFORMIN (GLUCOPHAGE) 1000 MG tablet, TAKE 1 TABLET TWICE A DAY WITH MEALS (Patient taking differently: Take 1,000 mg by mouth 2 (two) times daily with a meal.), Disp: 180 tablet, Rfl: 3 ?  Multiple Vitamins-Minerals (CENTRUM SILVER ADULT 50+ PO), Take 1 tablet by mouth daily., Disp: , Rfl:  ?  Telmisartan-amLODIPine 40-10 MG TABS, Take 1 tablet by mouth at bedtime., Disp: 90 tablet, Rfl: 1 ?  thiamine 100 MG tablet, Take 1 tablet (100 mg total) by mouth daily., Disp: 90 tablet, Rfl: 1 ? ?Vitals  ? ?Vitals:  ? 10/08/21 1707  ?BP: 138/82  ?Pulse: 99  ?Resp: 17  ?Temp: 98.5 ?F (36.9 ?C)  ?TempSrc: Oral  ?SpO2: 96%  ?  ? ?There is no height or weight on file to calculate BMI. ? ?Physical Exam  ? ?Exam performed over telemedicine with 2-way video and audio communication and with assistance of bedside RN ? ?Physical Exam ?Gen: A&O x4, NAD ?Resp: normal WOB ?CV: extremities appear well-perfused ? ?Neuro: ?*MS: A&O x4. Follows multi-step commands.  ?*Speech: nondysarthric, no aphasia, able to name  and repeat ?*CN: PERRL 10m, EOMI, VFF by confrontation, R facial numbness, smile symmetric, hearing intact to voice ?*Motor:   Normal bulk.  No tremor, rigidity or bradykinesia. No pronator drift. All extremities appear full-strength and symmetric. ?*Sensory: SILT. Symmetric. No double-simultaneous extinction.  ?*Coordination:  Finger-to-nose, heel-to-shin, rapid alternating motions were intact. ?*Reflexes:  UTA 2/2 tele-exam ?*Gait: deferred ? ?NIHSS = 1 for sensory deficit ? ? ?Premorbid mRS =  0 ? ? ?Labs  ? ?CBC:  ?Recent Labs  ?Lab 10/06/21 ?0823  ?WBC 7.0  ?NEUTROABS 4.5  ?HGB 12.8*  ?HCT 41.0  ?MCV 88.9  ?PLT 187  ? ? ?Basic Metabolic Panel:  ?Lab Results  ?Component Value Date  ? NA 136 10/06/2021  ? K 3.9 10/06/2021  ? CO2 27 10/06/2021  ? GLUCOSE 144 (H) 10/06/2021  ? BUN 14 10/06/2021  ? CREATININE 0.95 10/06/2021  ? CALCIUM 9.0 10/06/2021  ? GFRNONAA >60 10/06/2021  ? GFRAA 90 (L) 02/28/2012  ? ?Lipid Panel:  ?Lab Results  ?Component Value Date  ? LDLCALC 53 09/11/2021  ? ?HgbA1c:  ?Lab Results  ?Component Value Date  ? HGBA1C 7.0 (H) 10/06/2021  ? ?Urine Drug Screen:  ?   ?Component Value Date/Time  ? [place] DETECTED 10/06/2021 1309  ? COCAINSCRNUR NONE DETECTED 10/06/2021 1309  ? LABBENZ NONE DETECTED 10/06/2021 1309  ? AMPHETMU NONE DETECTED 10/06/2021 1309  ? THCU NONE DETECTED 10/06/2021 1309  ? LABBARB NONE DETECTED 10/06/2021 1309  ?  ?Alcohol Level No results found for: ETH ? ? ?Impression  ? ?This is a 71 yo gentleman with hx HTN, DM, HL just discharged earlier this week after a small acute ischemic infarct L frontoparietal vertex who presents with acute onset R facial numbness, now resolving c/f TIA vs small recurrent ischemic infarct. Will admit overnight for observation and MRI. If he does have a new infarct he may need TEE if it looks like it could be central embolic in origin. He had no hemodynamically significant cerebrovascular stenosis on CTA earlier this week. ? ?Recommendations   ? ?- Admit to hospitalist service for stroke w/u ?- Permissive HTN x48 hrs from sx onset or until stroke ruled out by MRI goal BP <220/110. PRN labetalol or hydralazine if BP above these parameters. Avoid

## 2021-10-08 NOTE — ED Provider Notes (Signed)
? ?[facility] ?Provider Note ? ? ? Event Date/Time  ? First MD Initiated Contact with Patient 10/08/21 1714   ?  (approximate) ? ? ?History  ? ?Code Stroke ? ? ?HPI ? ?Robert Robertson. is a 71 y.o. male  with medical history significant of HTN, HLD, DM, former smoker and recent admission discharged yesterday after he was found to have an acute stroke in left frontal parietal vertex with symptoms including right arm numbness from shoulders to fingers without any associated weakness slurred speech or facial droop who presents for evaluation of right-sided facial numbness that started at 3:40 PM associate with headache.  He states that since emergency room he feels that the gotten better.  He also states the right arm numbness he had earlier is also been better since leaving hospital.  He denies any interim falls or injuries, fever, cough, shortness of breath, abdominal pain, chest pain, back pain or vomiting or diarrhea.  He has had a little bit of headache earlier today but this is also better now.  Denies tobacco use EtOH use illicit drug use.  No other acute concerns at this time. ? ?  ? ? ?Physical Exam  ?Triage Vital Signs: ?ED Triage Vitals [10/08/21 1707]  ?Enc Vitals Group  ?   BP 138/82  ?   Pulse Rate 99  ?   Resp 17  ?   Temp 98.5 ?F (36.9 ?C)  ?   Temp Source Oral  ?   SpO2 96 %  ?   Weight   ?   Height   ?   Head Circumference   ?   Peak Flow   ?   Pain Score   ?   Pain Loc   ?   Pain Edu?   ?   Excl. in [place]?   ? ? ?Most recent vital signs: ?Vitals:  ? 10/08/21 1707 10/08/21 1745  ?BP: 138/82 (!) 141/89  ?Pulse: 99 89  ?Resp: 17 17  ?Temp: 98.5 ?F (36.9 ?C)   ?SpO2: 96% 99%  ? ? ?General: Awake, no distress.  ?CV:  Good peripheral perfusion.  2+ radial pulses. ?Resp:  Normal effort.  Clear bilaterally. ?Abd:  No distention.  Soft. ?Other:  Cranial nerves II through XII grossly intact.  No pronator drift.  No finger dysmetria.  Symmetric 5/5 strength of all extremities.   Sensation intact to light touch in all extremities. ? ? ? ? ?ED Results / Procedures / Treatments  ?Labs ?(all labs ordered are listed, but only abnormal results are displayed) ?Labs Reviewed  ?CBC - Abnormal; Notable for the following components:  ?    Result Value  ? Hemoglobin 12.8 (*)   ? All other components within normal limits  ?COMPREHENSIVE METABOLIC PANEL - Abnormal; Notable for the following components:  ? Glucose, Bld 102 (*)   ? AST 42 (*)   ? ALT 56 (*)   ? All other components within normal limits  ?CBG MONITORING, ED - Abnormal; Notable for the following components:  ? Glucose-Capillary 102 (*)   ? All other components within normal limits  ?RESP PANEL BY RT-PCR (FLU A&B, COVID) ARPGX2  ?ETHANOL  ?PROTIME-INR  ?APTT  ?DIFFERENTIAL  ?URINE DRUG SCREEN, QUALITATIVE ([facility] ONLY)  ?URINALYSIS, ROUTINE W REFLEX MICROSCOPIC  ? ? ? ?EKG ? ?EKG is remarkable for sinus rhythm with a ventricular rate of 88, normal axis, unremarkable intervals without evidence of acute ischemia or significant arrhythmia. ? ?RADIOLOGY ? ?  CT head without contrast, interpretation shows no evidence of hemorrhage or other clear significant acute infarct.  I discussed with radiology who notes remote left temporal parietal infarct but no other acute process. ? ?CTA head and neck, interpretation preliminarily shows no injury or large vessel occlusion.  I also reviewed radiology interpretation and agree with her findings of no large vessel occlusion or significant stenosis or dissection. ? ? ?PROCEDURES: ? ?Critical Care performed: Yes, see critical care procedure note(s) ? ?.1-3 Lead EKG Interpretation ?Performed by: Lucrezia Starch, MD ?Authorized by: Lucrezia Starch, MD  ? ?  Interpretation: normal   ?  ECG rate assessment: normal   ?  Rhythm: sinus rhythm   ?  Ectopy: none   ?  Conduction: normal   ?.Critical Care ?Performed by: Lucrezia Starch, MD ?Authorized by: Lucrezia Starch, MD  ? ?Critical care provider statement:  ?   Critical care time (minutes):  30 ?  Critical care time was exclusive of:  Separately billable procedures and treating other patients ?  Critical care was necessary to treat or prevent imminent or life-threatening deterioration of the following conditions:  CNS failure or compromise ?  Critical care was time spent personally by me on the following activities:  Development of treatment plan with patient or surrogate, discussions with consultants, evaluation of patient's response to treatment, examination of patient, ordering and review of laboratory studies, ordering and review of radiographic studies, ordering and performing treatments and interventions, pulse oximetry, re-evaluation of patient's condition and review of old charts ? ?The patient is on the cardiac monitor to evaluate for evidence of arrhythmia and/or significant heart rate changes. ? ? ?MEDICATIONS ORDERED IN ED: ?Medications  ?aspirin chewable tablet 81 mg (has no administration in time range)  ?clopidogrel (PLAVIX) tablet 75 mg (has no administration in time range)  ?iohexol (OMNIPAQUE) 350 MG/ML injection 75 mL (75 mLs Intravenous Contrast Given 10/08/21 1726)  ? ? ? ?IMPRESSION / MDM / ASSESSMENT AND PLAN / ED COURSE  ?I reviewed the triage vital signs and the nursing notes. ?             ?               ? ?Differential diagnosis includes, but is not limited to recurrent CVA, SAH, metabolic derangements, anemia and neuropathy.  No history exam features to suggest acute infectious process or recent trauma.  Low suspicion for toxic ingestion.  Given patient reports symptom onset within 6 hours prior to arrival he was made code stroke on arrival. ? ?CT head without contrast, interpretation shows no evidence of hemorrhage or other clear significant acute infarct.  I discussed with radiology who notes remote left temporal parietal infarct but no other acute process. ? ?CTA head and neck, interpretation preliminarily shows no injury or large vessel  occlusion.  I also reviewed radiology interpretation and agree with her findings of no large vessel occlusion or significant stenosis or dissection. ? ?CMP shows no significant electrolyte or metabolic derangements aside from very mild transaminitis.  CBC without leukocytosis or acute anemia.  Ethanol undetectable.  INR is 1. ? ?I discussed patient with consulting teleneurologist who saw patient Dr. Quinn Axe recommends admission and MRI.  Not a TNK or interventional candidate at this time. ? ?I discussed with the hospitalist will admit patient for further evaluation and management. ? ?  ? ? ?FINAL CLINICAL IMPRESSION(S) / ED DIAGNOSES  ? ?Final diagnoses:  ?Numbness  ? ? ? ?Rx /  DC Orders  ? ?ED Discharge Orders   ? ? None  ? ?  ? ? ? ?Note:  This document was prepared using Dragon voice recognition software and may include unintentional dictation errors. ?  ?Lucrezia Starch, MD ?10/08/21 1815 ? ?

## 2021-10-08 NOTE — Assessment & Plan Note (Addendum)
Evidence of acute and chronic infarcts were noted on admission MRI brain from April 11-12.  He has a PFO.  He will take asa and plavix for 3 months, will continue his high dose statin .  He has good BP contol and his diabetes is controlled.  He has  been referred to neurology and cardiology  ?

## 2021-10-08 NOTE — Progress Notes (Signed)
? ?Subjective:  ?Patient ID: Robert Lat., male    DOB: 1951-02-23  Age: 71 y.o. MRN: [id number] ? ?CC: The primary encounter diagnosis was PFO (patent foramen ovale). Diagnoses of Cerebrovascular disease, History of CVA (cerebrovascular accident) without residual deficits, Alcohol use, DM type 2 with diabetic mixed hyperlipidemia ([place]), Essential hypertension, and Hospital discharge follow-up were also pertinent to this visit. ? ? ?This visit occurred during the SARS-CoV-2 public health emergency.  Safety protocols were in place, including screening questions prior to the visit, additional usage of staff PPE, and extensive cleaning of exam room while observing appropriate contact time as indicated for disinfecting solutions.   ? ?HPI ?Robert Barkley Bruns. presents for [place] ?Chief Complaint  ?Patient presents with  ? Hospitalization Follow-up  ?  Hospital follow for a TIA  ? ?HPI:  71 yr old male with type 2 DM, hypertension  and hyperlipidemia,  was admitted  to [facility] on April 11 with sudden onset of right arm numbness without weakness that occurred while getting dressed.   He was diagnosed with acute CVA by  MRI as well as evidence of prior infarcts  as follows:  ? ? " Diffusion imaging shows a punctate acute infarction at the left frontoparietal vertex, ... Old small vessel infarction in the  right side of the pons...and old cortical and subcortical infarction at the left posterior temporoparietal junction, and a few other scattered tiny cortical infarctions in the left frontal and parietal region." ? ?PFO  found on agitated saline study ,  and Neurology recommended to start DAPT with aspirin and Plavix for 3 weeks followed by aspirin only.  he was already on  high intensity statin.   He was set up with Dr Garen Lah for new patient cardiology follow-up  for further evaluation and possible intervention if needed for his PFO.  A ZIO monitor was also ordered. ? ?He was observed for alcohol  withdrawal and treated for alcohol abuse because he was not asked how many beers he drank daily,  just whether he drank daily.  He was discharged on thiamine and folate.   He never drinks more than 1-2 beers a day; his wife is a practicing alcoholic.  ? ?Discharged to home with no residual symptoms on April 12 .  Daughter is resent with him today.  Feels fine.  No numbness or headaches.  Pharmacy was out of Plavix yesterday when he tried to pick up the rx so he has not started it yet.  He is taking asa.  ? ?2) Type 2DM:  A1c was repeated during admission and lower than in march .  Now at 7.0  no hypoglycemic episodes reported ? ? ?Outpatient Medications Prior to Visit  ?Medication Sig Dispense Refill  ? aspirin EC 81 MG EC tablet Take 1 tablet (81 mg total) by mouth daily. Swallow whole. 30 tablet 11  ? atorvastatin (LIPITOR) 80 MG tablet TAKE 1 TABLET DAILY (Patient taking differently: Take 80 mg by mouth daily.) 90 tablet 3  ? blood glucose meter kit and supplies USE TO CHECK BLOOD SUGARS UP TO TWO TIMES DAILY 1 each 0  ? Blood Glucose Monitoring Suppl (FREESTYLE FREEDOM LITE) w/Device KIT 1 application by Does not apply route 2 (two) times daily. 1 kit 0  ? clopidogrel (PLAVIX) 75 MG tablet Take 1 tablet (75 mg total) by mouth daily for 21 days. 21 tablet 0  ? folic acid (FOLVITE) 1 MG tablet Take 1 tablet (1  mg total) by mouth daily. 90 tablet 1  ? glucose blood (FREESTYLE LITE) test strip USE TO CHECK SUGARS TWICE A DAY 200 each 1  ? hydrochlorothiazide (HYDRODIURIL) 25 MG tablet TAKE 1 TABLET DAILY (Patient taking differently: Take 25 mg by mouth daily.) 90 tablet 3  ? JANUVIA 100 MG tablet TAKE 1 TABLET DAILY (Patient taking differently: Take 100 mg by mouth daily.) 90 tablet 3  ? Krill Oil 300 MG CAPS Take 300 mg by mouth daily.    ? Lancets (FREESTYLE) lancets USE TO CHECK SUGAR TWICE A DAY 200 each 3  ? metFORMIN (GLUCOPHAGE) 1000 MG tablet TAKE 1 TABLET TWICE A DAY WITH MEALS (Patient taking differently:  Take 1,000 mg by mouth 2 (two) times daily with a meal.) 180 tablet 3  ? Multiple Vitamins-Minerals (CENTRUM SILVER ADULT 50+ PO) Take 1 tablet by mouth daily.    ? Telmisartan-amLODIPine 40-10 MG TABS Take 1 tablet by mouth at bedtime. 90 tablet 1  ? thiamine 100 MG tablet Take 1 tablet (100 mg total) by mouth daily. 90 tablet 1  ? ?No facility-administered medications prior to visit.  ? ? ?Review of Systems; ? ?Patient denies headache, fevers, malaise, unintentional weight loss, skin rash, eye pain, sinus congestion and sinus pain, sore throat, dysphagia,  hemoptysis , cough, dyspnea, wheezing, chest pain, palpitations, orthopnea, edema, abdominal pain, nausea, melena, diarrhea, constipation, flank pain, dysuria, hematuria, urinary  Frequency, nocturia, numbness, tingling, seizures,  Focal weakness, Loss of consciousness,  Tremor, insomnia, depression, anxiety, and suicidal ideation.   ? ? ? ?Objective:  ?BP 134/80 (BP Location: Left Arm, Patient Position: Sitting, Cuff Size: Normal)   Pulse (!) 103   Temp 98 ?F (36.7 ?C) (Oral)   Ht 5' 8"  (1.727 m)   Wt 191 lb (86.6 kg)   SpO2 96%   BMI 29.04 kg/m?  ? ?BP Readings from Last 3 Encounters:  ?10/08/21 134/80  ?10/07/21 136/81  ?09/16/21 126/72  ? ? ?Wt Readings from Last 3 Encounters:  ?10/08/21 191 lb (86.6 kg)  ?10/06/21 190 lb 7.6 oz (86.4 kg)  ?09/16/21 190 lb 6.4 oz (86.4 kg)  ? ? ?General appearance: alert, cooperative and appears stated age ?Ears: normal TM's and external ear canals both ears ?Throat: lips, mucosa, and tongue normal; teeth and gums normal ?Neck: no adenopathy, no carotid bruit, supple, symmetrical, trachea midline and thyroid not enlarged, symmetric, no tenderness/mass/nodules ?Back: symmetric, no curvature. ROM normal. No CVA tenderness. ?Lungs: clear to auscultation bilaterally ?Heart: regular rate and rhythm, S1, S2 normal, no murmur, click, rub or gallop ?Abdomen: soft, non-tender; bowel sounds normal; no masses,  no  organomegaly ?Pulses: 2+ and symmetric ?Skin: Skin color, texture, turgor normal. No rashes or lesions ?Lymph nodes: Cervical, supraclavicular, and axillary nodes normal. ?Neuro: CNs 2-12 intact. DTRs 2+/4 in biceps, brachioradialis, patellars and achilles. Muscle strength 5/5 in upper and lower exremities. Fine resting tremor bilaterally both hands cerebellar function normal. Romberg negative.  No pronator drift.   Gait normal.   ? ?Lab Results  ?Component Value Date  ? HGBA1C 7.0 (H) 10/06/2021  ? HGBA1C 7.4 (H) 09/11/2021  ? HGBA1C 7.2 (H) 03/16/2021  ? ? ?Lab Results  ?Component Value Date  ? CREATININE 0.95 10/06/2021  ? CREATININE 0.87 09/11/2021  ? CREATININE 0.90 03/16/2021  ? ? ?Lab Results  ?Component Value Date  ? WBC 7.0 10/06/2021  ? HGB 12.8 (L) 10/06/2021  ? HCT 41.0 10/06/2021  ? PLT 187 10/06/2021  ? GLUCOSE 144 (H)  10/06/2021  ? CHOL 125 09/11/2021  ? TRIG 72.0 09/11/2021  ? HDL 57.80 09/11/2021  ? LDLDIRECT 52.8 10/06/2021  ? LDLCALC 53 09/11/2021  ? ALT 50 (H) 10/06/2021  ? AST 39 10/06/2021  ? NA 136 10/06/2021  ? K 3.9 10/06/2021  ? CL 100 10/06/2021  ? CREATININE 0.95 10/06/2021  ? BUN 14 10/06/2021  ? CO2 27 10/06/2021  ? PSA 2.43 09/11/2021  ? INR 0.9 10/06/2021  ? HGBA1C 7.0 (H) 10/06/2021  ? MICROALBUR 0.9 03/16/2021  ? ? ?CT ANGIO HEAD NECK W WO CM ? ?Result Date: 10/06/2021 ?CLINICAL DATA:  Provided history: Neuro deficit, acute, stroke suspected. Additional history provided: Paresthesia of right arm. EXAM: CT ANGIOGRAPHY HEAD AND NECK TECHNIQUE: Multidetector CT imaging of the head and neck was performed using the standard protocol during bolus administration of intravenous contrast. Multiplanar CT image reconstructions and MIPs were obtained to evaluate the vascular anatomy. Carotid stenosis measurements (when applicable) are obtained utilizing NASCET criteria, using the distal internal carotid diameter as the denominator. RADIATION DOSE REDUCTION: This exam was performed according to  the departmental dose-optimization program which includes automated exposure control, adjustment of the mA and/or kV according to patient size and/or use of iterative reconstruction technique. CONTRAST:  125m OMNIPAQUE IO

## 2021-10-08 NOTE — ED Notes (Signed)
Patient transported to MRI 

## 2021-10-08 NOTE — Progress Notes (Signed)
Code stroke activated per elert @ 1724, page sent to Dr Quinn Axe @ 1730/ on screen at 1730 to assess patient, TNK decision at 1732, as patient had stroke last week  ?

## 2021-10-08 NOTE — Assessment & Plan Note (Addendum)
Pt has a h/o of tobacco abuse / emphysema.  ?PRN ventolin. ?Supplemental oxygen as needed. ?

## 2021-10-08 NOTE — Progress Notes (Signed)
CODE STROKE- PHARMACY COMMUNICATION ? ? ?Time CODE STROKE called/page received:17:16 ? ?Time response to CODE STROKE was made (in person): 17:20 ? ?Time Stroke Kit retrieved from [place] (only if needed):not indicated ? ?Name of Provider/Nurse contacted: ? ?Past Medical History:  ?Diagnosis Date  ? Allergy   ? Colon polyp   ? Diabetes mellitus   ? Hyperlipidemia   ? Hypertension   ? ?Prior to Admission medications   ?Medication Sig Start Date End Date Taking? Authorizing Provider  ?aspirin EC 81 MG EC tablet Take 1 tablet (81 mg total) by mouth daily. Swallow whole. 10/08/21   [name], MD  ?atorvastatin (LIPITOR) 80 MG tablet TAKE 1 TABLET DAILY ?Patient taking differently: Take 80 mg by mouth daily. 06/08/21   [name], MD  ?blood glucose meter kit and supplies USE TO CHECK BLOOD SUGARS UP TO TWO TIMES DAILY 07/30/19   [name], MD  ?Blood Glucose Monitoring Suppl (FREESTYLE FREEDOM LITE) w/Device KIT 1 application by Does not apply route 2 (two) times daily. 08/21/19   [name], MD  ?clopidogrel (PLAVIX) 75 MG tablet Take 1 tablet (75 mg total) by mouth daily for 21 days. 10/08/21 10/29/21  [name], MD  ?folic acid (FOLVITE) 1 MG tablet Take 1 tablet (1 mg total) by mouth daily. 10/08/21   [name], MD  ?glucose blood (FREESTYLE LITE) test strip USE TO CHECK SUGARS TWICE A DAY 07/07/21   [name], MD  ?hydrochlorothiazide (HYDRODIURIL) 25 MG tablet TAKE 1 TABLET DAILY ?Patient taking differently: Take 25 mg by mouth daily. 11/21/20   [name], MD  ?JANUVIA 100 MG tablet TAKE 1 TABLET DAILY ?Patient taking differently: Take 100 mg by mouth daily. 08/11/21   [name], MD  ?Astrid Drafts 300 MG CAPS Take 300 mg by mouth daily.    [provider]  ?Lancets (FREESTYLE) lancets USE TO CHECK SUGAR TWICE A DAY 08/08/20   [name], MD  ?metFORMIN (GLUCOPHAGE) 1000 MG tablet TAKE 1 TABLET TWICE A DAY WITH MEALS ?Patient taking differently: Take 1,000 mg by mouth 2  (two) times daily with a meal. 05/12/21   [name], MD  ?Multiple Vitamins-Minerals (CENTRUM SILVER ADULT 50+ PO) Take 1 tablet by mouth daily.    [provider]  ?Telmisartan-amLODIPine 40-10 MG TABS Take 1 tablet by mouth at bedtime. 09/16/21   [name], MD  ?thiamine 100 MG tablet Take 1 tablet (100 mg total) by mouth daily. 10/08/21   [name], MD  ? ? ?Vira Blanco ,PharmD ?Clinical Pharmacist  ?10/08/2021  6:05 PM ? ? ?

## 2021-10-08 NOTE — Assessment & Plan Note (Signed)
Well controlled on current regimen of telmisartan/amlodipine/hct.  Renal function stable, no changes today. ?

## 2021-10-08 NOTE — Assessment & Plan Note (Addendum)
.  Abnormal LFT mildly high. ? ?Goal; is to optimize diabetes and statin therapy.  ?

## 2021-10-08 NOTE — Assessment & Plan Note (Signed)
He is a moderate drinker.  He has no history of alcohol abuse ; he does not need thiamine and folate. ?

## 2021-10-08 NOTE — Assessment & Plan Note (Addendum)
.-   Admit to hospitalist service for stroke w/u ?- Permissive HTN x48 hrs from sx onset or until stroke ruled out by MRI goal BP <220/110. PRN labetalol or hydralazine if BP above these parameters. Avoid oral antihypertensives. ?- MRI brain wo contrast ?- Hold off on CTA or MRA H&N and TTE at this time since he already had them earlier this week ?- Atorvastatin '80mg'$  daily ?- NPO until bedside nursing dysphagia screen ?- ASA '81mg'$  daily + plavix '75mg'$  daily x21 days f/b ASA '81mg'$  daily monotherapy after that ?- q4 hr neuro checks ?- STAT head CT for any change in neuro exam ?- Tele ?- PT/OT/SLP ?- Stroke education ?- Outpatient neuro f/u after hospital discharge ?

## 2021-10-08 NOTE — Assessment & Plan Note (Signed)
Patient was discharged yesterday from [facility].  Patient is stable post discharge and has no new issues or questions about his discharge plans, and all referrals have been made ?

## 2021-10-08 NOTE — ED Notes (Signed)
Pt given meal tray and drink. Pt tolerating well.  ?

## 2021-10-08 NOTE — ED Notes (Addendum)
Pt up to use bathroom, VS to be collected after pt back in bed. Pt stable, VS stable up to this time.  ?

## 2021-10-08 NOTE — ED Triage Notes (Addendum)
Pt to ED via POV from home. Pt reports right sided facial numbness that started at 3:40pm. Pt reports symptoms have not completely resolved and now reports onset of HA. Pt seen on 4/11 and diagnosed with stroke. Pt started blood thinner this afternoon. CBG 106 ?

## 2021-10-08 NOTE — Assessment & Plan Note (Signed)
BS are at goal on metformin , Januvia , statin .  ASA resumed due to acute CVA October 06 2021.  ? ?Lab Results  ?Component Value Date  ? HGBA1C 7.0 (H) 10/06/2021  ? ?Lab Results  ?Component Value Date  ? LABMICR See below: 08/05/2021  ? LABMICR See below: 08/04/2020  ? MICROALBUR 0.9 03/16/2021  ? MICROALBUR <0.7 03/04/2020  ? ? ? ?Lab Results  ?Component Value Date  ? CHOL 125 09/11/2021  ? HDL 57.80 09/11/2021  ? LDLCALC 53 09/11/2021  ? LDLDIRECT 52.8 10/06/2021  ? TRIG 72.0 09/11/2021  ? CHOLHDL 2 09/11/2021  ? ? ?

## 2021-10-08 NOTE — Assessment & Plan Note (Addendum)
Blood pressure 129/89, pulse 83, temperature 98.5 ?F (36.9 ?C), temperature source Oral, resp. rate 19, height '5\' 8"'$  (1.727 m), weight 86.2 kg, SpO2 98 %. ?Permissive HTN home meds held.  ?. ?

## 2021-10-08 NOTE — Assessment & Plan Note (Addendum)
Home regimen of novolog/ metformin and januvia held and we wil cover with Glycemic protocol and SSI/ accuchecks.  ? a1c is 7  09/2021. ?

## 2021-10-08 NOTE — ED Notes (Signed)
Pt states he is unable to urinate at this time.

## 2021-10-08 NOTE — ED Notes (Signed)
Pt in MRI.

## 2021-10-08 NOTE — Assessment & Plan Note (Addendum)
Stable. Continue Atorvastatin 80mg. 

## 2021-10-08 NOTE — H&P (Signed)
?History and Physical  ? ? ?Patient: Robert Robertson. IWP:[id number] DOB: 1950/10/02 ?DOA: 10/08/2021 ?DOS: the patient was seen and examined on 10/08/2021 ?PCP: [name], MD  ?Patient coming from: Home ? ?Chief Complaint:  ?Chief Complaint  ?Patient presents with  ? Code Stroke  ? ?HPI: Nilton Robertson. is a 71 y.o. male coming to [place] today in a private vehicle for right-sided facial numbness that started about 3:40 PM per nurses report.  Symptoms have resolved now and patient has headache.  Patient's last known normal was 340. ?Upon arrival to the emergency room a code stroke was called. ?Patient had a recent admission for CVA on April 11 to October 07, 2021 where he was found to have a PFO patient was to take dual antiplatelet therapy for 3 months along with high intensity statin therapy.  Patient was discharged yesterday and patient was actually seen by primary care today for hospital follow-up appointment. ?Initial EKG was sinus rhythm 88 with normal axis and no ST-T wave changes. ?Code stroke protocol CT noncontrast head done stat showed no evidence of hemorrhage or infarct patient noted to have remote left temporal parietal infarct. ?CT angio head and neck done which showed no large vessel occlusion. ?Neurology is consulted for code stroke with recommendations for admission and an MRI of the brain.  Patient does not meet criteria for TNK therapy or any intervention at this time. ?Symptoms lasted 30 minutes and resolved. RUE not involved. Advised to not drive if he has symptoms of any kind next time as he drove to two hospital today.  ? ?Review of Systems: Review of Systems  ?Neurological:  Positive for dizziness and tingling.  ?All other systems reviewed and are negative. ? ?Past Medical History:  ?Diagnosis Date  ? Allergy   ? Colon polyp   ? Diabetes mellitus   ? Hyperlipidemia   ? Hypertension   ? ?Past Surgical History:  ?Procedure Laterality Date  ? COLONOSCOPY  2010  ? 10 mm adenomatous  polyp in the descending colon without atypia, [name], MD  ? COLONOSCOPY WITH PROPOFOL N/A 07/18/2019  ? Procedure: COLONOSCOPY WITH PROPOFOL;  Surgeon: Robert Bellow, MD;  Location: [facility] ENDOSCOPY;  Service: Endoscopy;  Laterality: N/A;  ? ?Social History:  reports that he quit smoking about 8 years ago. His smoking use included cigarettes. He has a 22.50 pack-year smoking history. He has never used smokeless tobacco. He reports current alcohol use of about 14.0 standard drinks per week. He reports that he does not use drugs. ? ?Allergies  ?Allergen Reactions  ? Pollen Extract Other (See Comments)  ?  Hay-fever. Reaction: Sneezing  ? ? ?Family History  ?Problem Relation Age of Onset  ? Diabetes Mother   ? Stroke Mother   ? Colon cancer Mother   ? Diabetes Brother   ? Diabetes Maternal Grandfather   ? Diabetes Maternal Grandmother   ? Cancer Sister   ?     brain tumor  ? ? ?Prior to Admission medications   ?Medication Sig Start Date End Date Taking? Authorizing Provider  ?aspirin EC 81 MG EC tablet Take 1 tablet (81 mg total) by mouth daily. Swallow whole. 10/08/21   [name], MD  ?atorvastatin (LIPITOR) 80 MG tablet TAKE 1 TABLET DAILY ?Patient taking differently: Take 80 mg by mouth daily. 06/08/21   [name], MD  ?blood glucose meter kit and supplies USE TO CHECK BLOOD SUGARS UP TO TWO TIMES DAILY 07/30/19  [name], MD  ?Blood Glucose Monitoring Suppl (FREESTYLE FREEDOM LITE) w/Device KIT 1 application by Does not apply route 2 (two) times daily. 08/21/19   [name], MD  ?clopidogrel (PLAVIX) 75 MG tablet Take 1 tablet (75 mg total) by mouth daily for 21 days. 10/08/21 10/29/21  [name], MD  ?folic acid (FOLVITE) 1 MG tablet Take 1 tablet (1 mg total) by mouth daily. 10/08/21   [name], MD  ?glucose blood (FREESTYLE LITE) test strip USE TO CHECK SUGARS TWICE A DAY 07/07/21   [name], MD  ?hydrochlorothiazide (HYDRODIURIL) 25 MG tablet TAKE 1 TABLET DAILY ?Patient  taking differently: Take 25 mg by mouth daily. 11/21/20   [name], MD  ?JANUVIA 100 MG tablet TAKE 1 TABLET DAILY ?Patient taking differently: Take 100 mg by mouth daily. 08/11/21   [name], MD  ?Astrid Drafts 300 MG CAPS Take 300 mg by mouth daily.    [provider]  ?Lancets (FREESTYLE) lancets USE TO CHECK SUGAR TWICE A DAY 08/08/20   [name], MD  ?metFORMIN (GLUCOPHAGE) 1000 MG tablet TAKE 1 TABLET TWICE A DAY WITH MEALS ?Patient taking differently: Take 1,000 mg by mouth 2 (two) times daily with a meal. 05/12/21   [name], MD  ?Multiple Vitamins-Minerals (CENTRUM SILVER ADULT 50+ PO) Take 1 tablet by mouth daily.    [provider]  ?Telmisartan-amLODIPine 40-10 MG TABS Take 1 tablet by mouth at bedtime. 09/16/21   [name], MD  ?thiamine 100 MG tablet Take 1 tablet (100 mg total) by mouth daily. 10/08/21   [name], MD  ? ? ?Physical Exam: ?Vitals:  ? 10/08/21 2000 10/08/21 2030 10/08/21 2115 10/08/21 2200  ?BP: 129/89   118/89  ?Pulse: 77 84 83 84  ?Resp: 17 15 19  (!) 21  ?Temp:      ?TempSrc:      ?SpO2: 98% 98% 98% 99%  ?Weight:      ?Height:      ? ?Physical Exam ?Vitals and nursing note reviewed.  ?Constitutional:   ?   General: He is not in acute distress. ?   Appearance: Normal appearance. He is not ill-appearing, toxic-appearing or diaphoretic.  ?HENT:  ?   Head: Normocephalic and atraumatic.  ?   Right Ear: Hearing and external ear normal.  ?   Left Ear: Hearing and external ear normal.  ?   Nose: Nose normal. No nasal deformity.  ?   Mouth/Throat:  ?   Lips: Pink.  ?   Mouth: Mucous membranes are moist.  ?   Tongue: No lesions.  ?   Pharynx: Oropharynx is clear.  ?Eyes:  ?   General: No visual field deficit. ?   Extraocular Movements: Extraocular movements intact.  ?   Pupils: Pupils are equal, round, and reactive to light.  ?Neck:  ?   Vascular: No carotid bruit.  ?Cardiovascular:  ?   Rate and Rhythm: Normal rate and regular rhythm.  ?   Pulses:  Normal pulses.  ?   Heart sounds: Normal heart sounds.  ?Pulmonary:  ?   Effort: Pulmonary effort is normal.  ?   Breath sounds: Normal breath sounds.  ?Abdominal:  ?   General: Bowel sounds are normal. There is no distension.  ?   Palpations: Abdomen is soft. There is no mass.  ?   Tenderness: There is no abdominal tenderness. There is no guarding.  ?   Hernia: No  hernia is present.  ?Musculoskeletal:  ?   Right lower leg: No edema.  ?   Left lower leg: No edema.  ?Skin: ?   General: Skin is warm.  ?Neurological:  ?   General: No focal deficit present.  ?   Mental Status: He is alert and oriented to person, place, and time.  ?   Cranial Nerves: Cranial nerves 2-12 are intact. No cranial nerve deficit, dysarthria or facial asymmetry.  ?   Motor: Motor function is intact.  ?   Coordination: Coordination is intact. Finger-Nose-Finger Test and Heel to [place] Test normal.  ?   Gait: Gait is intact.  ?   Deep Tendon Reflexes:  ?   Reflex Scores: ?     Bicep reflexes are 2+ on the right side and 2+ on the left side. ?     Patellar reflexes are 2+ on the right side and 2+ on the left side. ?Psychiatric:     ?   Attention and Perception: Attention normal.     ?   Mood and Affect: Mood normal.     ?   Speech: Speech normal.     ?   Behavior: Behavior normal. Behavior is cooperative.     ?   Cognition and Memory: Cognition normal.  ? ? ?Data Reviewed: ?Results for orders placed or performed during the hospital encounter of 10/08/21 (from the past 24 hour(s))  ?CBG monitoring, ED     Status: Abnormal  ? Collection Time: 10/08/21  5:21 PM  ?Result Value Ref Range  ? Glucose-Capillary 102 (H) 70 - 99 mg/dL  ?Resp Panel by RT-PCR (Flu A&B, Covid) Nasopharyngeal Swab     Status: None  ? Collection Time: 10/08/21  5:39 PM  ? Specimen: Nasopharyngeal Swab; Nasopharyngeal(NP) swabs in vial transport medium  ?Result Value Ref Range  ? SARS Coronavirus 2 by RT PCR NEGATIVE NEGATIVE  ? Influenza A by PCR NEGATIVE NEGATIVE  ? Influenza B  by PCR NEGATIVE NEGATIVE  ?Ethanol     Status: None  ? Collection Time: 10/08/21  5:39 PM  ?Result Value Ref Range  ? Alcohol, Ethyl (B) <10 <10 mg/dL  ?Protime-INR     Status: None  ? Collection Time: 04/1

## 2021-10-08 NOTE — ED Notes (Signed)
Pt in CT, information gathered pt's daughter who is healthcare POA.  ?

## 2021-10-09 DIAGNOSIS — R200 Anesthesia of skin: Secondary | ICD-10-CM | POA: Diagnosis not present

## 2021-10-09 LAB — HEMOGLOBIN A1C
Hgb A1c MFr Bld: 6.9 % — ABNORMAL HIGH (ref 4.8–5.6)
Mean Plasma Glucose: 151.33 mg/dL

## 2021-10-09 LAB — LIPID PANEL
Cholesterol: 121 mg/dL (ref 0–200)
HDL: 46 mg/dL (ref >40)
LDL Cholesterol: 60 mg/dL (ref 0–99)
Total CHOL/HDL Ratio: 2.6 RATIO
Triglycerides: 74 mg/dL (ref <150)
VLDL: 15 mg/dL (ref 0–40)

## 2021-10-09 LAB — CBG MONITORING, ED: Glucose-Capillary: 137 mg/dL — ABNORMAL HIGH (ref 70–99)

## 2021-10-09 NOTE — ED Notes (Signed)
Pt ambulated to bathroom with strong steady gait.  ?

## 2021-10-09 NOTE — Progress Notes (Signed)
SLP Cancellation Note ? ?Patient Details ?Name: Rodrigus Henry Krzywicki Jr. ?MRN: [id number] ?DOB: 02/06/1951 ? ? ?Cancelled treatment:       Reason Eval/Treat Not Completed: SLP screened, no needs identified, will sign off (chart reviewed; consulted NSG then met w/ pt in room. MD present as well.) ?Pt denied any difficulty swallowing and is currently on a regular diet; tolerates swallowing pills w/ water per NSG. Pt conversed in conversation w/out overt expressive/receptive deficits noted; pt denied any speech-language deficits. Speech clear. ?No further skilled ST services indicated as pt appears at his baseline. Pt agreed. NSG to reconsult if any change in status while admitted.   ? ? ? ? ?Katherine Watson, MS, CCC-SLP ?Speech Language Pathologist ?Rehab Services; [facility] [place] [facility] ?[phone] (ascom) ?Watson,Katherine ?10/09/2021, 9:02 AM ?

## 2021-10-15 DIAGNOSIS — K760 Fatty (change of) liver, not elsewhere classified: Secondary | ICD-10-CM | POA: Diagnosis not present

## 2021-10-24 ENCOUNTER — Encounter: Payer: Self-pay | Admitting: Internal Medicine

## 2021-10-27 ENCOUNTER — Other Ambulatory Visit: Payer: Self-pay | Admitting: Internal Medicine

## 2021-10-27 MED ORDER — CLOPIDOGREL BISULFATE 75 MG PO TABS: 75.0000 mg | ORAL_TABLET | Freq: Every day | ORAL | 2 refills | Status: DC

## 2021-11-02 ENCOUNTER — Ambulatory Visit (INDEPENDENT_AMBULATORY_CARE_PROVIDER_SITE_OTHER): Payer: Medicare PPO

## 2021-11-02 ENCOUNTER — Telehealth: Payer: Self-pay | Admitting: Cardiology

## 2021-11-02 DIAGNOSIS — I639 Cerebral infarction, unspecified: Secondary | ICD-10-CM | POA: Diagnosis not present

## 2021-11-02 NOTE — Telephone Encounter (Signed)
Spoke with the patient. ?Apologized for the inconvenience. Advised him that the issue with the zio order has been corrected and that he should receive the monitor this week. Advised him to follow the application and return instructions. Rescheduled the new pt 11/05/21 appt with Dr. Garen Lah to 12/07/21 to allow for the monitor results to be received. ? ?Patient agreeable with the plan and voiced appreciation for the call. ? ?

## 2021-11-02 NOTE — Telephone Encounter (Signed)
Patient calling in because they havent received the heart monitor and he was told that he was supposed to received it prior to his monitor. He thought he was supposed to have so results prior to the appt.Please advise  ?

## 2021-11-05 ENCOUNTER — Ambulatory Visit: Payer: TRICARE For Life (TFL) | Admitting: Cardiology

## 2021-11-08 DIAGNOSIS — I639 Cerebral infarction, unspecified: Secondary | ICD-10-CM | POA: Diagnosis not present

## 2021-11-24 ENCOUNTER — Other Ambulatory Visit: Payer: Self-pay | Admitting: Internal Medicine

## 2021-12-07 ENCOUNTER — Ambulatory Visit (INDEPENDENT_AMBULATORY_CARE_PROVIDER_SITE_OTHER): Payer: Medicare PPO | Admitting: Cardiology

## 2021-12-07 ENCOUNTER — Encounter: Payer: Self-pay | Admitting: Cardiology

## 2021-12-07 ENCOUNTER — Other Ambulatory Visit
Admission: RE | Admit: 2021-12-07 | Discharge: 2021-12-07 | Disposition: A | Discharge status: Home / Self Care | Payer: Medicare PPO | Source: Ambulatory Visit | Attending: Cardiology | Admitting: Cardiology

## 2021-12-07 VITALS — BP 116/82 | HR 89 | Ht 68.0 in | Wt 185.0 lb

## 2021-12-07 DIAGNOSIS — I10 Essential (primary) hypertension: Secondary | ICD-10-CM

## 2021-12-07 DIAGNOSIS — I639 Cerebral infarction, unspecified: Secondary | ICD-10-CM

## 2021-12-07 DIAGNOSIS — E7800 Pure hypercholesterolemia, unspecified: Secondary | ICD-10-CM

## 2021-12-07 DIAGNOSIS — R931 Abnormal findings on diagnostic imaging of heart and coronary circulation: Secondary | ICD-10-CM | POA: Diagnosis not present

## 2021-12-07 LAB — BASIC METABOLIC PANEL
Anion gap: 8 (ref 5–15)
BUN: 17 mg/dL (ref 8–23)
CO2: 26 mmol/L (ref 22–32)
Calcium: 9 mg/dL (ref 8.9–10.3)
Chloride: 102 mmol/L (ref 98–111)
Creatinine, Ser: 0.76 mg/dL (ref 0.61–1.24)
GFR, Estimated: >60 mL/min (ref >60)
Glucose, Bld: 113 mg/dL — ABNORMAL HIGH (ref 70–99)
Potassium: 4 mmol/L (ref 3.5–5.1)
Sodium: 136 mmol/L (ref 135–145)

## 2021-12-07 LAB — CBC
HCT: 41.9 % (ref 39.0–52.0)
Hemoglobin: 13.2 g/dL (ref 13.0–17.0)
MCH: 27.5 pg (ref 26.0–34.0)
MCHC: 31.5 g/dL (ref 30.0–36.0)
MCV: 87.3 fL (ref 80.0–100.0)
Platelets: 197 10*3/uL (ref 150–400)
RBC: 4.8 MIL/uL (ref 4.22–5.81)
RDW: 13.2 % (ref 11.5–15.5)
WBC: 5.9 10*3/uL (ref 4.0–10.5)
nRBC: 0 % (ref 0.0–0.2)

## 2021-12-07 LAB — HM DIABETES EYE EXAM

## 2021-12-07 NOTE — Patient Instructions (Signed)
Medication Instructions:   Your physician recommends that you continue on your current medications as directed. Please refer to the Current Medication list given to you today.   *If you need a refill on your cardiac medications before your next appointment, please call your pharmacy*   Lab Work:  Please go to the medical mall after your appointment today for a lab draw (CBC, BMP).   Testing/Procedures:   You are scheduled for a TEE on Wednesday 6/14 with Dr. Garen Lah.  Please arrive at the [facility] at 7:00AM am/pm. (1 hour prior to procedure).  DIET: Nothing to eat or drink after midnight except a sip of water with medications (see medication instructions below)   Medication Instructions:  Hold  hydrochlorothiazide (HYDRODIURIL) 25 MG tablet   You must have a responsible person to drive you home and stay in the waiting area during your procedure. Failure to do so could result in cancellation.  Bring your insurance cards.    Follow-Up: At [facility], you and your health needs are our priority.  As part of our continuing mission to provide you with exceptional heart care, we have created designated Provider Care Teams.  These Care Teams include your primary Cardiologist (physician) and Advanced Practice Providers (APPs -  Physician Assistants and Nurse Practitioners) who all work together to provide you with the care you need, when you need it.  We recommend signing up for the patient portal called "MyChart".  Sign up information is provided on this After Visit Summary.  MyChart is used to connect with patients for Virtual Visits (Telemedicine).  Patients are able to view lab/test results, encounter notes, upcoming appointments, etc.  Non-urgent messages can be sent to your provider as well.   To learn more about what you can do with MyChart, go to [email or web address].    Your next appointment:   6 week(s)  The format for your next appointment:   In  Person  Provider:   You may see [name], MD or one of the following Advanced Practice Providers on your designated Care Team:   Murray Hodgkins, NP Christell Faith, PA-C Cadence Kathlen Mody, [place]   Other Instructions   Important Information About Sugar

## 2021-12-07 NOTE — H&P (View-Only) (Signed)
Cardiology Office Note:    Date:  12/07/2021   ID:  [name], DOB July 08, 1950, MRN [id number]  PCP:  [name], MD   [facility] Providers Cardiologist:  [name], MD     Referring MD: [name], MD   Chief Complaint  Patient presents with   Hospitalization Follow-up    History of Present Illness:    Tracey Stewart. is a 71 y.o. male with a hx of hypertension, hyperlipidemia, diabetes, CVA, former smoker presenting due to recent admission for stroke.  Admitted to the hospital October 16, 2018 due to right arm numbness, imaging/brain MRI did reveal acute left post central gyrus stroke.  He denies any weakness or speech issues.  Started on aspirin, Plavix.  Takes medications as prescribed.  Echocardiogram 10/06/2021 EF 60 to 65%, saline contrast did reveal possible atrial septal defect.  Cardiac monitor 12/01/2021, no evidence for atrial fibrillation or atrial flutter.  Paroxysmal SVT, 2 nonsustained VT noted  Past Medical History:  Diagnosis Date   Allergy    Colon polyp    Diabetes mellitus    Hyperlipidemia    Hypertension     Past Surgical History:  Procedure Laterality Date   COLONOSCOPY  2010   10 mm adenomatous polyp in the descending colon without atypia, [name], MD   COLONOSCOPY WITH PROPOFOL N/A 07/18/2019   Procedure: COLONOSCOPY WITH PROPOFOL;  Surgeon: Robert Bellow, MD;  Location: [facility]OSCOPY;  Service: Endoscopy;  Laterality: N/A;    Current Medications: Current Meds  Medication Sig   aspirin EC 81 MG EC tablet Take 1 tablet (81 mg total) by mouth daily. Swallow whole.   atorvastatin (LIPITOR) 80 MG tablet TAKE 1 TABLET DAILY   blood glucose meter kit and supplies USE TO CHECK BLOOD SUGARS UP TO TWO TIMES DAILY   Blood Glucose Monitoring Suppl (FREESTYLE FREEDOM LITE) w/Device KIT 1 application by Does not apply route 2 (two) times daily.   clopidogrel (PLAVIX) 75 MG tablet Take 1 tablet (75 mg total)  by mouth daily.   glucose blood (FREESTYLE LITE) test strip USE TO CHECK SUGARS TWICE A DAY   hydrochlorothiazide (HYDRODIURIL) 25 MG tablet TAKE 1 TABLET DAILY   JANUVIA 100 MG tablet TAKE 1 TABLET DAILY   Krill Oil 300 MG CAPS Take 300 mg by mouth daily.   Lancets (FREESTYLE) lancets USE TO CHECK SUGAR TWICE A DAY   metFORMIN (GLUCOPHAGE) 1000 MG tablet TAKE 1 TABLET TWICE A DAY WITH MEALS   Multiple Vitamins-Minerals (CENTRUM SILVER ADULT 50+ PO) Take 1 tablet by mouth daily.   Telmisartan-amLODIPine 40-10 MG TABS Take 1 tablet by mouth at bedtime.     Allergies:   Pollen extract   Social History   Socioeconomic History   Marital status: Married    Spouse name: Not on file   Number of children: Not on file   Years of education: Not on file   Highest education level: Not on file  Occupational History   Not on file  Tobacco Use   Smoking status: Former    Packs/day: 0.50    Years: 45.00    Total pack years: 22.50    Types: Cigarettes    Quit date: 2015    Years since quitting: 8.4   Smokeless tobacco: Never  Vaping Use   Vaping Use: Never used  Substance and Sexual Activity   Alcohol use: Yes    Alcohol/week: 2.0 standard drinks of alcohol  Types: 2 Glasses of wine per week    Comment: weekly   Drug use: No   Sexual activity: Not on file  Other Topics Concern   Not on file  Social History Narrative   Not on file   Social Determinants of Health   Financial Resource Strain: Low Risk  (03/25/2021)   Overall Financial Resource Strain (CARDIA)    Difficulty of Paying Living Expenses: Not hard at all  Food Insecurity: No Food Insecurity (03/25/2021)   Hunger Vital Sign    Worried About Running Out of Food in the Last Year: Never true    Ran Out of Food in the Last Year: Never true  Transportation Needs: No Transportation Needs (03/25/2021)   PRAPARE - Hydrologist (Medical): No    Lack of Transportation (Non-Medical): No  Physical  Activity: Unknown (01/26/2018)   Exercise Vital Sign    Days of Exercise per Week: 0 days    Minutes of Exercise per Session: Not on file  Stress: No Stress Concern Present (03/25/2021)   [place]    Feeling of Stress : Not at all  Social Connections: Not on file     Family History: The patient's family history includes Cancer in his sister; Colon cancer in his mother; Diabetes in his brother, maternal grandfather, maternal grandmother, and mother; Stroke in his mother.  ROS:   Please see the history of present illness.     All other systems reviewed and are negative.  EKGs/Labs/Other Studies Reviewed:    The following studies were reviewed today:   EKG:  EKG is  ordered today.  The ekg ordered today demonstrates normal sinus rhythm, normal ECG  Recent Labs: 10/08/2021: ALT 56; B Natriuretic Peptide 9.8; BUN 14; Creatinine, Ser 0.80; Hemoglobin 12.8; Platelets 173; Potassium 4.0; Sodium 137  Recent Lipid Panel    Component Value Date/Time   CHOL 121 10/09/2021 0452   TRIG 74 10/09/2021 0452   HDL 46 10/09/2021 0452   CHOLHDL 2.6 10/09/2021 0452   VLDL 15 10/09/2021 0452   LDLCALC 60 10/09/2021 0452   LDLDIRECT 52.8 10/06/2021 0823     Risk Assessment/Calculations:          Physical Exam:    VS:  BP 116/82 (BP Location: Right Arm, Patient Position: Sitting, Cuff Size: Normal)   Pulse 89   Ht 5' 8"  (1.727 m)   Wt 185 lb (83.9 kg)   SpO2 98%   BMI 28.13 kg/m     Wt Readings from Last 3 Encounters:  12/07/21 185 lb (83.9 kg)  10/08/21 190 lb (86.2 kg)  10/08/21 191 lb (86.6 kg)     GEN:  Well nourished, well developed in no acute distress HEENT: Normal NECK: No JVD; No carotid bruits CARDIAC: RRR, no murmurs, rubs, gallops RESPIRATORY:  Clear to auscultation without rales, wheezing or rhonchi  ABDOMEN: Soft, non-tender, non-distended MUSCULOSKELETAL:  No edema; No deformity  SKIN: Warm and  dry NEUROLOGIC:  Alert and oriented x 3 PSYCHIATRIC:  Normal affect   ASSESSMENT:    1. Cerebrovascular accident (CVA), unspecified mechanism ([place])   2. Abnormal echocardiogram   3. Primary hypertension   4. Pure hypercholesterolemia    PLAN:    In order of problems listed above:  Recent CVA, cardiac monitor with no A-fib or flutter.  Echo did reveal saline contrast shunting suggesting atrial septal defect.  Continue aspirin, Plavix, Lipitor.  Plan for TEE.  Consider referral to structural team for PFO closure based on TEE results. Hypertension, BP controlled.  Continue current meds. Hyperlipidemia, cholesterol at goal.  Continue Lipitor 80.  Follow-up in 6 weeks after TEE.      Shared Decision Making/Informed Consent The risks [esophageal damage, perforation (1:10,000 risk), bleeding, pharyngeal hematoma as well as other potential complications associated with conscious sedation including aspiration, arrhythmia, respiratory failure and death], benefits (treatment guidance and diagnostic support) and alternatives of a transesophageal echocardiogram were discussed in detail with Mr. Min and he is willing to proceed.     Medication Adjustments/Labs and Tests Ordered: Current medicines are reviewed at length with the patient today.  Concerns regarding medicines are outlined above.  Orders Placed This Encounter  Procedures   CBC   Basic metabolic panel   EKG 25-QDIY   No orders of the defined types were placed in this encounter.   Patient Instructions  Medication Instructions:   Your physician recommends that you continue on your current medications as directed. Please refer to the Current Medication list given to you today.   *If you need a refill on your cardiac medications before your next appointment, please call your pharmacy*   Lab Work:  Please go to the medical mall after your appointment today for a lab draw (CBC, BMP).   Testing/Procedures:   You are  scheduled for a TEE on Wednesday 6/14 with Dr. Garen Lah.  Please arrive at the [facility] at 7:00AM am/pm. (1 hour prior to procedure).  DIET: Nothing to eat or drink after midnight except a sip of water with medications (see medication instructions below)   Medication Instructions:  Hold  hydrochlorothiazide (HYDRODIURIL) 25 MG tablet   You must have a responsible person to drive you home and stay in the waiting area during your procedure. Failure to do so could result in cancellation.  Bring your insurance cards.    Follow-Up: At [facility], you and your health needs are our priority.  As part of our continuing mission to provide you with exceptional heart care, we have created designated Provider Care Teams.  These Care Teams include your primary Cardiologist (physician) and Advanced Practice Providers (APPs -  Physician Assistants and Nurse Practitioners) who all work together to provide you with the care you need, when you need it.  We recommend signing up for the patient portal called "MyChart".  Sign up information is provided on this After Visit Summary.  MyChart is used to connect with patients for Virtual Visits (Telemedicine).  Patients are able to view lab/test results, encounter notes, upcoming appointments, etc.  Non-urgent messages can be sent to your provider as well.   To learn more about what you can do with MyChart, go to [email or web address].    Your next appointment:   6 week(s)  The format for your next appointment:   In Person  Provider:   You may see [name], MD or one of the following Advanced Practice Providers on your designated Care Team:   Murray Hodgkins, NP Christell Faith, PA-C Cadence Kathlen Mody, [place]   Other Instructions   Important Information About Sugar         Signed, [name], MD  12/07/2021 10:34 AM    [place]

## 2021-12-07 NOTE — Progress Notes (Signed)
Cardiology Office Note:    Date:  12/07/2021   ID:  [name], DOB 1950-08-26, MRN [id number]  PCP:  [name], MD   [facility] Providers Cardiologist:  [name], MD     Referring MD: [name], MD   Chief Complaint  Patient presents with   Hospitalization Follow-up    History of Present Illness:    Robert Teems. is a 71 y.o. male with a hx of hypertension, hyperlipidemia, diabetes, CVA, former smoker presenting due to recent admission for stroke.  Admitted to the hospital October 16, 2018 due to right arm numbness, imaging/brain MRI did reveal acute left post central gyrus stroke.  He denies any weakness or speech issues.  Started on aspirin, Plavix.  Takes medications as prescribed.  Echocardiogram 10/06/2021 EF 60 to 65%, saline contrast did reveal possible atrial septal defect.  Cardiac monitor 12/01/2021, no evidence for atrial fibrillation or atrial flutter.  Paroxysmal SVT, 2 nonsustained VT noted  Past Medical History:  Diagnosis Date   Allergy    Colon polyp    Diabetes mellitus    Hyperlipidemia    Hypertension     Past Surgical History:  Procedure Laterality Date   COLONOSCOPY  2010   10 mm adenomatous polyp in the descending colon without atypia, [name], MD   COLONOSCOPY WITH PROPOFOL N/A 07/18/2019   Procedure: COLONOSCOPY WITH PROPOFOL;  Surgeon: Robert Bellow, MD;  Location: [facility];  Service: Endoscopy;  Laterality: N/A;    Current Medications: Current Meds  Medication Sig   aspirin EC 81 MG EC tablet Take 1 tablet (81 mg total) by mouth daily. Swallow whole.   atorvastatin (LIPITOR) 80 MG tablet TAKE 1 TABLET DAILY   blood glucose meter kit and supplies USE TO CHECK BLOOD SUGARS UP TO TWO TIMES DAILY   Blood Glucose Monitoring Suppl (FREESTYLE FREEDOM LITE) w/Device KIT 1 application by Does not apply route 2 (two) times daily.   clopidogrel (PLAVIX) 75 MG tablet Take 1 tablet (75 mg total)  by mouth daily.   glucose blood (FREESTYLE LITE) test strip USE TO CHECK SUGARS TWICE A DAY   hydrochlorothiazide (HYDRODIURIL) 25 MG tablet TAKE 1 TABLET DAILY   JANUVIA 100 MG tablet TAKE 1 TABLET DAILY   Krill Oil 300 MG CAPS Take 300 mg by mouth daily.   Lancets (FREESTYLE) lancets USE TO CHECK SUGAR TWICE A DAY   metFORMIN (GLUCOPHAGE) 1000 MG tablet TAKE 1 TABLET TWICE A DAY WITH MEALS   Multiple Vitamins-Minerals (CENTRUM SILVER ADULT 50+ PO) Take 1 tablet by mouth daily.   Telmisartan-amLODIPine 40-10 MG TABS Take 1 tablet by mouth at bedtime.     Allergies:   Pollen extract   Social History   Socioeconomic History   Marital status: Married    Spouse name: Not on file   Number of children: Not on file   Years of education: Not on file   Highest education level: Not on file  Occupational History   Not on file  Tobacco Use   Smoking status: Former    Packs/day: 0.50    Years: 45.00    Total pack years: 22.50    Types: Cigarettes    Quit date: 2015    Years since quitting: 8.4   Smokeless tobacco: Never  Vaping Use   Vaping Use: Never used  Substance and Sexual Activity   Alcohol use: Yes    Alcohol/week: 2.0 standard drinks of alcohol  Types: 2 Glasses of wine per week    Comment: weekly   Drug use: No   Sexual activity: Not on file  Other Topics Concern   Not on file  Social History Narrative   Not on file   Social Determinants of Health   Financial Resource Strain: Low Risk  (03/25/2021)   Overall Financial Resource Strain (CARDIA)    Difficulty of Paying Living Expenses: Not hard at all  Food Insecurity: No Food Insecurity (03/25/2021)   Hunger Vital Sign    Worried About Running Out of Food in the Last Year: Never true    Ran Out of Food in the Last Year: Never true  Transportation Needs: No Transportation Needs (03/25/2021)   PRAPARE - Hydrologist (Medical): No    Lack of Transportation (Non-Medical): No  Physical  Activity: Unknown (01/26/2018)   Exercise Vital Sign    Days of Exercise per Week: 0 days    Minutes of Exercise per Session: Not on file  Stress: No Stress Concern Present (03/25/2021)   [place]    Feeling of Stress : Not at all  Social Connections: Not on file     Family History: The patient's family history includes Cancer in his sister; Colon cancer in his mother; Diabetes in his brother, maternal grandfather, maternal grandmother, and mother; Stroke in his mother.  ROS:   Please see the history of present illness.     All other systems reviewed and are negative.  EKGs/Labs/Other Studies Reviewed:    The following studies were reviewed today:   EKG:  EKG is  ordered today.  The ekg ordered today demonstrates normal sinus rhythm, normal ECG  Recent Labs: 10/08/2021: ALT 56; B Natriuretic Peptide 9.8; BUN 14; Creatinine, Ser 0.80; Hemoglobin 12.8; Platelets 173; Potassium 4.0; Sodium 137  Recent Lipid Panel    Component Value Date/Time   CHOL 121 10/09/2021 0452   TRIG 74 10/09/2021 0452   HDL 46 10/09/2021 0452   CHOLHDL 2.6 10/09/2021 0452   VLDL 15 10/09/2021 0452   LDLCALC 60 10/09/2021 0452   LDLDIRECT 52.8 10/06/2021 0823     Risk Assessment/Calculations:          Physical Exam:    VS:  BP 116/82 (BP Location: Right Arm, Patient Position: Sitting, Cuff Size: Normal)   Pulse 89   Ht 5' 8"  (1.727 m)   Wt 185 lb (83.9 kg)   SpO2 98%   BMI 28.13 kg/m     Wt Readings from Last 3 Encounters:  12/07/21 185 lb (83.9 kg)  10/08/21 190 lb (86.2 kg)  10/08/21 191 lb (86.6 kg)     GEN:  Well nourished, well developed in no acute distress HEENT: Normal NECK: No JVD; No carotid bruits CARDIAC: RRR, no murmurs, rubs, gallops RESPIRATORY:  Clear to auscultation without rales, wheezing or rhonchi  ABDOMEN: Soft, non-tender, non-distended MUSCULOSKELETAL:  No edema; No deformity  SKIN: Warm and  dry NEUROLOGIC:  Alert and oriented x 3 PSYCHIATRIC:  Normal affect   ASSESSMENT:    1. Cerebrovascular accident (CVA), unspecified mechanism ([place])   2. Abnormal echocardiogram   3. Primary hypertension   4. Pure hypercholesterolemia    PLAN:    In order of problems listed above:  Recent CVA, cardiac monitor with no A-fib or flutter.  Echo did reveal saline contrast shunting suggesting atrial septal defect.  Continue aspirin, Plavix, Lipitor.  Plan for TEE.  Consider referral to structural team for PFO closure based on TEE results. Hypertension, BP controlled.  Continue current meds. Hyperlipidemia, cholesterol at goal.  Continue Lipitor 80.  Follow-up in 6 weeks after TEE.      Shared Decision Making/Informed Consent The risks [esophageal damage, perforation (1:10,000 risk), bleeding, pharyngeal hematoma as well as other potential complications associated with conscious sedation including aspiration, arrhythmia, respiratory failure and death], benefits (treatment guidance and diagnostic support) and alternatives of a transesophageal echocardiogram were discussed in detail with Mr. Humphrey and he is willing to proceed.     Medication Adjustments/Labs and Tests Ordered: Current medicines are reviewed at length with the patient today.  Concerns regarding medicines are outlined above.  Orders Placed This Encounter  Procedures   CBC   Basic metabolic panel   EKG [id number]   No orders of the defined types were placed in this encounter.   Patient Instructions  Medication Instructions:   Your physician recommends that you continue on your current medications as directed. Please refer to the Current Medication list given to you today.   *If you need a refill on your cardiac medications before your next appointment, please call your pharmacy*   Lab Work:  Please go to the medical mall after your appointment today for a lab draw (CBC, BMP).   Testing/Procedures:   You are  scheduled for a TEE on Wednesday 6/14 with Dr. Garen Lah.  Please arrive at the [facility] at 7:00AM am/pm. (1 hour prior to procedure).  DIET: Nothing to eat or drink after midnight except a sip of water with medications (see medication instructions below)   Medication Instructions:  Hold  hydrochlorothiazide (HYDRODIURIL) 25 MG tablet   You must have a responsible person to drive you home and stay in the waiting area during your procedure. Failure to do so could result in cancellation.  Bring your insurance cards.    Follow-Up: At [facility], you and your health needs are our priority.  As part of our continuing mission to provide you with exceptional heart care, we have created designated Provider Care Teams.  These Care Teams include your primary Cardiologist (physician) and Advanced Practice Providers (APPs -  Physician Assistants and Nurse Practitioners) who all work together to provide you with the care you need, when you need it.  We recommend signing up for the patient portal called "MyChart".  Sign up information is provided on this After Visit Summary.  MyChart is used to connect with patients for Virtual Visits (Telemedicine).  Patients are able to view lab/test results, encounter notes, upcoming appointments, etc.  Non-urgent messages can be sent to your provider as well.   To learn more about what you can do with MyChart, go to [email or web address].    Your next appointment:   6 week(s)  The format for your next appointment:   In Person  Provider:   You may see [name], MD or one of the following Advanced Practice Providers on your designated Care Team:   Murray Hodgkins, NP Christell Faith, PA-C Cadence Kathlen Mody, [place]   Other Instructions   Important Information About Sugar         Signed, [name], MD  12/07/2021 10:34 AM    [place]

## 2021-12-08 DIAGNOSIS — E119 Type 2 diabetes mellitus without complications: Secondary | ICD-10-CM | POA: Diagnosis not present

## 2021-12-09 ENCOUNTER — Other Ambulatory Visit: Payer: Self-pay

## 2021-12-09 ENCOUNTER — Ambulatory Visit (HOSPITAL_BASED_OUTPATIENT_CLINIC_OR_DEPARTMENT_OTHER)
Admission: RE | Admit: 2021-12-09 | Discharge: 2021-12-09 | Disposition: A | Discharge status: Home / Self Care | Payer: Medicare PPO | Source: Home / Self Care | Attending: Cardiology | Admitting: Cardiology

## 2021-12-09 ENCOUNTER — Ambulatory Visit
Admission: RE | Admit: 2021-12-09 | Discharge: 2021-12-09 | Disposition: A | Discharge status: Home / Self Care | Payer: Medicare PPO | Attending: Cardiology | Admitting: Cardiology

## 2021-12-09 ENCOUNTER — Encounter
Admission: RE | Disposition: A | Discharge status: Home / Self Care | Payer: Self-pay | Source: Home / Self Care | Attending: Cardiology

## 2021-12-09 ENCOUNTER — Encounter: Payer: Self-pay | Admitting: Cardiology

## 2021-12-09 ENCOUNTER — Telehealth: Payer: Self-pay

## 2021-12-09 DIAGNOSIS — Z7902 Long term (current) use of antithrombotics/antiplatelets: Secondary | ICD-10-CM | POA: Diagnosis not present

## 2021-12-09 DIAGNOSIS — Z87891 Personal history of nicotine dependence: Secondary | ICD-10-CM | POA: Diagnosis not present

## 2021-12-09 DIAGNOSIS — I6389 Other cerebral infarction: Secondary | ICD-10-CM

## 2021-12-09 DIAGNOSIS — R931 Abnormal findings on diagnostic imaging of heart and coronary circulation: Secondary | ICD-10-CM

## 2021-12-09 DIAGNOSIS — Q2112 Patent foramen ovale: Secondary | ICD-10-CM | POA: Insufficient documentation

## 2021-12-09 DIAGNOSIS — E7800 Pure hypercholesterolemia, unspecified: Secondary | ICD-10-CM | POA: Insufficient documentation

## 2021-12-09 DIAGNOSIS — I639 Cerebral infarction, unspecified: Secondary | ICD-10-CM

## 2021-12-09 DIAGNOSIS — I340 Nonrheumatic mitral (valve) insufficiency: Secondary | ICD-10-CM | POA: Diagnosis not present

## 2021-12-09 DIAGNOSIS — E119 Type 2 diabetes mellitus without complications: Secondary | ICD-10-CM | POA: Insufficient documentation

## 2021-12-09 DIAGNOSIS — Z79899 Other long term (current) drug therapy: Secondary | ICD-10-CM | POA: Diagnosis not present

## 2021-12-09 DIAGNOSIS — Z7982 Long term (current) use of aspirin: Secondary | ICD-10-CM | POA: Diagnosis not present

## 2021-12-09 DIAGNOSIS — Z7984 Long term (current) use of oral hypoglycemic drugs: Secondary | ICD-10-CM | POA: Insufficient documentation

## 2021-12-09 DIAGNOSIS — I10 Essential (primary) hypertension: Secondary | ICD-10-CM | POA: Diagnosis not present

## 2021-12-09 HISTORY — PX: TEE WITHOUT CARDIOVERSION: SHX5443

## 2021-12-09 LAB — GLUCOSE, CAPILLARY: Glucose-Capillary: 116 mg/dL — ABNORMAL HIGH (ref 70–99)

## 2021-12-09 SURGERY — ECHOCARDIOGRAM, TRANSESOPHAGEAL
Anesthesia: Moderate Sedation

## 2021-12-09 MED ORDER — MIDAZOLAM HCL 2 MG/2ML IJ SOLN
INTRAMUSCULAR | Status: AC
  Filled 2021-12-09: qty 4

## 2021-12-09 MED ORDER — SODIUM CHLORIDE 0.9 % IV SOLN: INTRAVENOUS | Status: DC

## 2021-12-09 MED ORDER — MIDAZOLAM HCL 2 MG/2ML IJ SOLN
INTRAMUSCULAR | Status: AC | PRN
  Administered 2021-12-09 (×2): 1 mg via INTRAVENOUS

## 2021-12-09 MED ORDER — FENTANYL CITRATE (PF) 100 MCG/2ML IJ SOLN
INTRAMUSCULAR | Status: AC | PRN
  Administered 2021-12-09 (×3): 25 ug via INTRAVENOUS

## 2021-12-09 MED ORDER — LIDOCAINE VISCOUS HCL 2 % MT SOLN
OROMUCOSAL | Status: AC
  Filled 2021-12-09: qty 15

## 2021-12-09 MED ORDER — LIDOCAINE VISCOUS HCL 2 % MT SOLN
OROMUCOSAL | Status: AC | PRN
  Administered 2021-12-09: 15 mL via OROMUCOSAL

## 2021-12-09 MED ORDER — FENTANYL CITRATE (PF) 100 MCG/2ML IJ SOLN
INTRAMUSCULAR | Status: AC
  Filled 2021-12-09: qty 2

## 2021-12-09 NOTE — Telephone Encounter (Signed)
Dr. Garen Lah did a TEE on patient this morning and asked me to place a referral to structural heart team for PFO closure consideration due to recent CVA. Referral placed and staff message sent to Harland German.

## 2021-12-09 NOTE — Interval H&P Note (Signed)
History and Physical Interval Note:  12/09/2021 8:28 AM  Robert Robertson.  has presented today for surgery, with the diagnosis of   CVA ,  Possible PFO.  The various methods of treatment have been discussed with the patient and family. After consideration of risks, benefits and other options for treatment, the patient has consented to  Procedure(s): TRANSESOPHAGEAL ECHOCARDIOGRAM (TEE) (N/A) as a surgical intervention.  The patient's history has been reviewed, patient examined, no change in status, stable for surgery.  I have reviewed the patient's chart and labs.  Questions were answered to the patient's satisfaction.     Aaron Edelman [place]

## 2021-12-09 NOTE — Procedures (Signed)
Transesophageal Echocardiogram :  Indication: CVA  Procedure: 10cc of viscous lidocaine were given orally to provide local anesthesia to the oropharynx. The patient was positioned supine on the left side, bite block provided. The patient was moderately sedated with the doses of versed and fentanyl as detailed below.  Using digital technique an omniplane probe was advanced into the esophagus without incident.   Moderate sedation: 1. Sedation used:  Versed: '2mg'$ , Fentanyl: 75 mcg 2. Time administered:   8:04  Time when patient started recovery:8:23 3. I was face to face during this time, 19 mins  See report in EPIC  for complete details: In brief, imaging revealed normal LV function with no RWMAs and no mural apical thrombus.  .  Estimated ejection fraction was 60%.  Right sided cardiac chambers were normal with no evidence of pulmonary hypertension.  Interatrial septum was aneurysmal Bubble study was positive for atrial shunt and PFO.  The LA was well visualized in orthogonal views.  There was no spontaneous contrast and no thrombus in the LA and LA appendage   The descending thoracic aorta had no  mural aortic debris with no evidence of aneurysmal dilation or disection  Recommendations: Cont asa, plavix as prescribed plan referral to structural heart team for PFO closure consideration due to recent CVA. Keep follow up appointments   [name] 12/09/2021 8:29 AM

## 2021-12-09 NOTE — Progress Notes (Signed)
*  PRELIMINARY RESULTS* Echocardiogram Echocardiogram Transesophageal has been performed.  Sherrie Sport 12/09/2021, 8:37 AM

## 2021-12-10 ENCOUNTER — Encounter: Payer: Self-pay | Admitting: Cardiology

## 2021-12-10 NOTE — Telephone Encounter (Signed)
Scheduled the patient for PFO consult on 12/21/2021 with Dr. Ali Lowe. He was grateful for call and agrees with plan.

## 2021-12-11 ENCOUNTER — Other Ambulatory Visit: Payer: Self-pay

## 2021-12-11 MED ORDER — CLOPIDOGREL BISULFATE 75 MG PO TABS: 75.0000 mg | ORAL_TABLET | Freq: Every day | ORAL | 2 refills | Status: DC

## 2021-12-15 ENCOUNTER — Other Ambulatory Visit: Payer: Medicare Other

## 2021-12-17 ENCOUNTER — Ambulatory Visit: Payer: Medicare Other | Admitting: Internal Medicine

## 2021-12-17 ENCOUNTER — Other Ambulatory Visit: Payer: Self-pay | Admitting: Internal Medicine

## 2021-12-21 ENCOUNTER — Encounter: Payer: Self-pay | Admitting: Internal Medicine

## 2021-12-21 ENCOUNTER — Ambulatory Visit (INDEPENDENT_AMBULATORY_CARE_PROVIDER_SITE_OTHER): Payer: Medicare PPO | Admitting: Internal Medicine

## 2021-12-21 VITALS — BP 106/64 | HR 84 | Ht 68.0 in | Wt 186.2 lb

## 2021-12-21 DIAGNOSIS — E785 Hyperlipidemia, unspecified: Secondary | ICD-10-CM | POA: Diagnosis not present

## 2021-12-21 DIAGNOSIS — E1159 Type 2 diabetes mellitus with other circulatory complications: Secondary | ICD-10-CM | POA: Diagnosis not present

## 2021-12-21 DIAGNOSIS — Q2112 Patent foramen ovale: Secondary | ICD-10-CM | POA: Diagnosis not present

## 2021-12-21 DIAGNOSIS — I152 Hypertension secondary to endocrine disorders: Secondary | ICD-10-CM

## 2021-12-21 DIAGNOSIS — E118 Type 2 diabetes mellitus with unspecified complications: Secondary | ICD-10-CM

## 2021-12-21 DIAGNOSIS — I700 Atherosclerosis of aorta: Secondary | ICD-10-CM | POA: Diagnosis not present

## 2021-12-21 DIAGNOSIS — E1169 Type 2 diabetes mellitus with other specified complication: Secondary | ICD-10-CM | POA: Diagnosis not present

## 2021-12-21 DIAGNOSIS — I253 Aneurysm of heart: Secondary | ICD-10-CM | POA: Diagnosis not present

## 2021-12-21 DIAGNOSIS — I639 Cerebral infarction, unspecified: Secondary | ICD-10-CM | POA: Diagnosis not present

## 2022-01-13 ENCOUNTER — Other Ambulatory Visit (INDEPENDENT_AMBULATORY_CARE_PROVIDER_SITE_OTHER): Payer: Medicare PPO

## 2022-01-13 DIAGNOSIS — E1169 Type 2 diabetes mellitus with other specified complication: Secondary | ICD-10-CM | POA: Diagnosis not present

## 2022-01-13 DIAGNOSIS — I10 Essential (primary) hypertension: Secondary | ICD-10-CM

## 2022-01-13 DIAGNOSIS — E782 Mixed hyperlipidemia: Secondary | ICD-10-CM | POA: Diagnosis not present

## 2022-01-13 LAB — COMPREHENSIVE METABOLIC PANEL
ALT: 49 U/L (ref 0–53)
AST: 49 U/L — ABNORMAL HIGH (ref 0–37)
Albumin: 4.6 g/dL (ref 3.5–5.2)
Alkaline Phosphatase: 56 U/L (ref 39–117)
BUN: 12 mg/dL (ref 6–23)
CO2: 26 mEq/L (ref 19–32)
Calcium: 9.2 mg/dL (ref 8.4–10.5)
Chloride: 99 mEq/L (ref 96–112)
Creatinine, Ser: 0.82 mg/dL (ref 0.40–1.50)
GFR: 88.59 mL/min (ref >60.00)
Glucose, Bld: 120 mg/dL — ABNORMAL HIGH (ref 70–99)
Potassium: 3.9 mEq/L (ref 3.5–5.1)
Sodium: 136 mEq/L (ref 135–145)
Total Bilirubin: 1 mg/dL (ref 0.2–1.2)
Total Protein: 7 g/dL (ref 6.0–8.3)

## 2022-01-13 LAB — LIPID PANEL
Cholesterol: 120 mg/dL (ref 0–200)
HDL: 50.9 mg/dL (ref >39.00)
LDL Cholesterol: 57 mg/dL (ref 0–99)
NonHDL: 68.84
Total CHOL/HDL Ratio: 2
Triglycerides: 60 mg/dL (ref 0.0–149.0)
VLDL: 12 mg/dL (ref 0.0–40.0)

## 2022-01-13 LAB — MICROALBUMIN / CREATININE URINE RATIO
Creatinine,U: 217.7 mg/dL
Microalb Creat Ratio: 0.7 mg/g (ref 0.0–30.0)
Microalb, Ur: 1.5 mg/dL (ref 0.0–1.9)

## 2022-01-13 LAB — HEMOGLOBIN A1C: Hgb A1c MFr Bld: 6.9 % — ABNORMAL HIGH (ref 4.6–6.5)

## 2022-01-18 ENCOUNTER — Encounter: Payer: Self-pay | Admitting: Internal Medicine

## 2022-01-18 ENCOUNTER — Ambulatory Visit (INDEPENDENT_AMBULATORY_CARE_PROVIDER_SITE_OTHER): Payer: Medicare PPO | Admitting: Internal Medicine

## 2022-01-18 VITALS — BP 126/86 | HR 80 | Temp 98.3°F | Ht 68.0 in | Wt 185.2 lb

## 2022-01-18 DIAGNOSIS — E1121 Type 2 diabetes mellitus with diabetic nephropathy: Secondary | ICD-10-CM

## 2022-01-18 DIAGNOSIS — E782 Mixed hyperlipidemia: Secondary | ICD-10-CM | POA: Diagnosis not present

## 2022-01-18 DIAGNOSIS — R040 Epistaxis: Secondary | ICD-10-CM | POA: Diagnosis not present

## 2022-01-18 DIAGNOSIS — E1169 Type 2 diabetes mellitus with other specified complication: Secondary | ICD-10-CM

## 2022-01-18 DIAGNOSIS — I10 Essential (primary) hypertension: Secondary | ICD-10-CM

## 2022-01-18 DIAGNOSIS — E7800 Pure hypercholesterolemia, unspecified: Secondary | ICD-10-CM | POA: Diagnosis not present

## 2022-01-18 DIAGNOSIS — K760 Fatty (change of) liver, not elsewhere classified: Secondary | ICD-10-CM | POA: Diagnosis not present

## 2022-01-18 NOTE — Assessment & Plan Note (Signed)
Elevated AST on current panel likely due to escessive alcohol consumption.  Advised to reduce alcohol intake .  He is vaccinated against Hep A and  B

## 2022-01-18 NOTE — Assessment & Plan Note (Signed)
BS are at goal on metformin and  Januvia.  He is tolaerating ARB and statin .  ASA resumed due to acute CVA October 06 2021.   Lab Results  Component Value Date   HGBA1C 6.9 (H) 01/13/2022   Lab Results  Component Value Date   LABMICR See below: 08/05/2021   LABMICR See below: 08/04/2020   MICROALBUR 1.5 01/13/2022   MICROALBUR 0.9 03/16/2021     Lab Results  Component Value Date   CHOL 120 01/13/2022   HDL 50.90 01/13/2022   LDLCALC 57 01/13/2022   LDLDIRECT 52.8 10/06/2021   TRIG 60.0 01/13/2022   CHOLHDL 2 01/13/2022

## 2022-01-18 NOTE — Assessment & Plan Note (Signed)
Managed with telmisartan .most recent UA was negative for protein .  Lab Results  Component Value Date   MICROALBUR 1.5 01/13/2022

## 2022-01-18 NOTE — Patient Instructions (Addendum)
Limit beer consumption to one red cup . Switch to non alcoholic after one.   No change to medications since your a1c is < 7.0 and  since you are exercising more frequently  Neuropathy does not lead to blood clots, but it can lead to foot infections and gangrene  You do not need CoQ10!  Fruits and vegetables  are probably more benrficial than your  multivitamin    Keep Afrin on hand for your nosebleeds  and continue moisturizing sinus passages daily

## 2022-01-18 NOTE — Assessment & Plan Note (Signed)
Well controlled on current regimen. Renal function stable, no changes today.  Lab Results  Component Value Date   CREATININE 0.82 01/13/2022   Lab Results  Component Value Date   NA 136 01/13/2022   K 3.9 01/13/2022   CL 99 01/13/2022   CO2 26 01/13/2022

## 2022-01-18 NOTE — Progress Notes (Signed)
Subjective:  Patient ID: Robert Lat., male    DOB: July 13, 1950  Age: 71 y.o. MRN: [id number]  CC: The primary encounter diagnosis was Essential hypertension. Diagnoses of DM type 2 with diabetic mixed hyperlipidemia ([place]), Hepatic steatosis, Frequent nosebleeds, Pure hypercholesterolemia, Diabetic nephropathy associated with type 2 diabetes mellitus ([place]), and Epistaxis were also pertinent to this visit.   HPI Robert Senteno. presents for follow up on type 2 DM,  hyperlipidemia and other issues  Chief Complaint  Patient presents with   Follow-up    3 month follow up on diabetes    1) Type 2 DM:   He feels generally well, is exercising several times per week and checking blood sugars once daily at variable times.  BS have been under 130 fasting and < 150 post prandially.  Denies any recent hypoglyemic events.  Taking his medications as directed. Following a carbohydrate modified diet 6 days per week. Denies numbness, burning and tingling of extremities. Appetite is good.     2) He has been having Nosebleeds that last for about 15 to 30 minutes ,  occurring once a month.   3) Elevated AST :  reviewed liver ultrasound , alcohol intake .  He is drinking 2-3 "Red Cups"  of beer nightly at his local bar  Lab Results  Component Value Date   PSA1 2.7 08/05/2021   PSA1 2.7 08/04/2020   PSA 2.43 09/11/2021   PSA 2.48 07/03/2019   PSA 2.00 05/14/2014      Outpatient Medications Prior to Visit  Medication Sig Dispense Refill   aspirin EC 81 MG EC tablet Take 1 tablet (81 mg total) by mouth daily. Swallow whole. 30 tablet 11   atorvastatin (LIPITOR) 80 MG tablet TAKE 1 TABLET DAILY 90 tablet 3   blood glucose meter kit and supplies USE TO CHECK BLOOD SUGARS UP TO TWO TIMES DAILY 1 each 0   Blood Glucose Monitoring Suppl (FREESTYLE FREEDOM LITE) w/Device KIT 1 application by Does not apply route 2 (two) times daily. 1 kit 0   clopidogrel (PLAVIX) 75 MG tablet Take 1 tablet  (75 mg total) by mouth daily. 90 tablet 2   glucose blood (FREESTYLE LITE) test strip USE TO CHECK SUGARS TWICE A DAY 200 each 1   hydrochlorothiazide (HYDRODIURIL) 25 MG tablet TAKE 1 TABLET DAILY 90 tablet 3   JANUVIA 100 MG tablet TAKE 1 TABLET DAILY 90 tablet 3   Krill Oil 300 MG CAPS Take 300 mg by mouth daily.     Lancets (FREESTYLE) lancets USE TO CHECK SUGAR TWICE A DAY 200 each 3   metFORMIN (GLUCOPHAGE) 1000 MG tablet TAKE 1 TABLET TWICE A DAY WITH MEALS 180 tablet 3   Multiple Vitamins-Minerals (CENTRUM SILVER ADULT 50+ PO) Take 1 tablet by mouth daily.     Telmisartan-amLODIPine 40-10 MG TABS Take 1 tablet by mouth at bedtime. 90 tablet 1   No facility-administered medications prior to visit.    Review of Systems;  Patient denies headache, fevers, malaise, unintentional weight loss, skin rash, eye pain, sinus congestion and sinus pain, sore throat, dysphagia,  hemoptysis , cough, dyspnea, wheezing, chest pain, palpitations, orthopnea, edema, abdominal pain, nausea, melena, diarrhea, constipation, flank pain, dysuria, hematuria, urinary  Frequency, nocturia, numbness, tingling, seizures,  Focal weakness, Loss of consciousness,  Tremor, insomnia, depression, anxiety, and suicidal ideation.      Objective:  BP 126/86 (BP Location: Left Arm, Patient Position: Sitting, Cuff Size: Normal)   Pulse  80   Temp 98.3 F (36.8 C) (Oral)   Ht 5' 8"  (1.727 m)   Wt 185 lb 3.2 oz (84 kg)   SpO2 96%   BMI 28.16 kg/m   BP Readings from Last 3 Encounters:  01/18/22 126/86  12/21/21 106/64  12/09/21 109/68    Wt Readings from Last 3 Encounters:  01/18/22 185 lb 3.2 oz (84 kg)  12/21/21 186 lb 3.2 oz (84.5 kg)  12/09/21 188 lb (85.3 kg)    General appearance: alert, cooperative and appears stated age Ears: normal TM's and external ear canals both ears Throat: lips, mucosa, and tongue normal; teeth and gums normal Neck: no adenopathy, no carotid bruit, supple, symmetrical, trachea  midline and thyroid not enlarged, symmetric, no tenderness/mass/nodules Back: symmetric, no curvature. ROM normal. No CVA tenderness. Lungs: clear to auscultation bilaterally Heart: regular rate and rhythm, S1, S2 normal, no murmur, click, rub or gallop Abdomen: soft, non-tender; bowel sounds normal; no masses,  no organomegaly Pulses: 2+ and symmetric Skin: Skin color, texture, turgor normal. No rashes or lesions Lymph nodes: Cervical, supraclavicular, and axillary nodes normal.  Lab Results  Component Value Date   HGBA1C 6.9 (H) 01/13/2022   HGBA1C 6.9 (H) 10/09/2021   HGBA1C 7.0 (H) 10/06/2021    Lab Results  Component Value Date   CREATININE 0.82 01/13/2022   CREATININE 0.76 12/07/2021   CREATININE 0.80 10/08/2021    Lab Results  Component Value Date   WBC 5.9 12/07/2021   HGB 13.2 12/07/2021   HCT 41.9 12/07/2021   PLT 197 12/07/2021   GLUCOSE 120 (H) 01/13/2022   CHOL 120 01/13/2022   TRIG 60.0 01/13/2022   HDL 50.90 01/13/2022   LDLDIRECT 52.8 10/06/2021   LDLCALC 57 01/13/2022   ALT 49 01/13/2022   AST 49 (H) 01/13/2022   NA 136 01/13/2022   K 3.9 01/13/2022   CL 99 01/13/2022   CREATININE 0.82 01/13/2022   BUN 12 01/13/2022   CO2 26 01/13/2022   PSA 2.43 09/11/2021   INR 1.0 10/08/2021   HGBA1C 6.9 (H) 01/13/2022   MICROALBUR 1.5 01/13/2022    ECHO TEE  Result Date: 12/09/2021    TRANSESOPHOGEAL ECHO REPORT   Patient Name:   Robert Lomax. Date of Exam: 12/09/2021 Medical Rec #:  [id number]              Height:       68.0 in Accession #:    [id number]             Weight:       188.0 lb Date of Birth:  1950/12/02              BSA:          1.991 m Patient Age:    57 years               BP:           103/66 mmHg Patient Gender: M                      HR:           78 bpm. Exam Location:  [facility] Procedure: Transesophageal Echo, Color Doppler, Cardiac Doppler and Saline            Contrast Bubble Study Indications:     I63.9 CVA-unspecified mechanism.   History:         Patient has prior history of Echocardiogram examinations, most  recent 10/06/2021. Risk Factors:Diabetes, Hypertension and                  Dyslipidemia.  Sonographer:     Robert Robertson Referring Phys:  [id number] Robert Robertson Diagnosing Phys: Robert Sable MD PROCEDURE: The transesophogeal probe was passed without difficulty through the esophogus of the patient. Sedation performed by performing physician. The patient developed no complications during the procedure. IMPRESSIONS  1. Left ventricular ejection fraction, by estimation, is 60 to 65%. The left ventricle has normal function.  2. Right ventricular systolic function is normal. The right ventricular size is normal.  3. No left atrial/left atrial appendage thrombus was detected.  4. The mitral valve is normal in structure. Mild mitral valve regurgitation.  5. The aortic valve is tricuspid. Aortic valve regurgitation is not visualized.  6. Evidence of atrial level shunting detected by color flow Doppler. Agitated saline contrast bubble study was positive with shunting observed within 3-6 cardiac cycles suggestive of interatrial shunt. atrial septum appears aneurysmal with evidence of PFO. FINDINGS  Left Ventricle: Left ventricular ejection fraction, by estimation, is 60 to 65%. The left ventricle has normal function. The left ventricular internal cavity size was normal in size. Right Ventricle: The right ventricular size is normal. No increase in right ventricular wall thickness. Right ventricular systolic function is normal. Left Atrium: Left atrial size was normal in size. No left atrial/left atrial appendage thrombus was detected. Right Atrium: Right atrial size was normal in size. Pericardium: There is no evidence of pericardial effusion. Mitral Valve: The mitral valve is normal in structure. Mild mitral valve regurgitation. Tricuspid Valve: The tricuspid valve is normal in structure. Tricuspid valve regurgitation is not  demonstrated. Aortic Valve: The aortic valve is tricuspid. Aortic valve regurgitation is not visualized. Pulmonic Valve: The pulmonic valve was normal in structure. Pulmonic valve regurgitation is not visualized. Aorta: The aortic root is normal in size and structure. IAS/Shunts: The interatrial septum is aneurysmal. Evidence of atrial level shunting detected by color flow Doppler. Agitated saline contrast was given intravenously to evaluate for intracardiac shunting. Agitated saline contrast bubble study was positive  with shunting observed within 3-6 cardiac cycles suggestive of interatrial shunt. Atrial septum appears aneurysmal with evidence of PFO. Robert Sable MD Electronically signed by Robert Sable MD Signature Date/Time: 12/09/2021/6:32:39 PM    Final     Assessment & Plan:   Problem List Items Addressed This Visit     Diabetic nephropathy associated with type 2 diabetes mellitus ([place])    Managed with telmisartan .most recent UA was negative for protein .  Lab Results  Component Value Date   MICROALBUR 1.5 01/13/2022         DM type 2 with diabetic mixed hyperlipidemia ([facility])    BS are at goal on metformin and  Januvia.  He is tolaerating ARB and statin .  ASA resumed due to acute CVA October 06 2021.   Lab Results  Component Value Date   HGBA1C 6.9 (H) 01/13/2022   Lab Results  Component Value Date   LABMICR See below: 08/05/2021   LABMICR See below: 08/04/2020   MICROALBUR 1.5 01/13/2022   MICROALBUR 0.9 03/16/2021     Lab Results  Component Value Date   CHOL 120 01/13/2022   HDL 50.90 01/13/2022   LDLCALC 57 01/13/2022   LDLDIRECT 52.8 10/06/2021   TRIG 60.0 01/13/2022   CHOLHDL 2 01/13/2022         Relevant Orders   Lipid Profile  Comprehensive metabolic panel   HgB B0A   HLD (hyperlipidemia)    He is tolerating maximal dose of atorvastatin. LDL is < 70  Lab Results  Component Value Date   CHOL 120 01/13/2022   HDL 50.90 01/13/2022   LDLCALC  57 01/13/2022   LDLDIRECT 52.8 10/06/2021   TRIG 60.0 01/13/2022   CHOLHDL 2 01/13/2022         Essential hypertension - Primary    Well controlled on current regimen. Renal function stable, no changes today.  Lab Results  Component Value Date   CREATININE 0.82 01/13/2022   Lab Results  Component Value Date   NA 136 01/13/2022   K 3.9 01/13/2022   CL 99 01/13/2022   CO2 26 01/13/2022         Relevant Orders   Comprehensive metabolic panel   Hepatic steatosis    Elevated AST on current panel likely due to escessive alcohol consumption.  Advised to reduce alcohol intake .  He is vaccinated against Hep A and  B      Epistaxis    Occurring around once a month,  No indication for PT/INR and coag testing .  Advised to use vasoconstrictor prn and saline irrigations daily       Other Visit Diagnoses     Frequent nosebleeds       Relevant Orders   CBC with Differential/Platelet       I spent a total of 30 minutes with this patient in a face to face visit on the date of this encounter reviewing the last office visit with me,  patient's diet and eating habits, home blood pressure readings, blood sugar readings,   most recent imaging study of liver,  and post visit ordering of testing and therapeutics.    Follow-up: No follow-ups on file.   [name], MD

## 2022-01-18 NOTE — Assessment & Plan Note (Signed)
Occurring around once a month,  No indication for PT/INR and coag testing .  Advised to use vasoconstrictor prn and saline irrigations daily

## 2022-01-18 NOTE — Assessment & Plan Note (Signed)
He is tolerating maximal dose of atorvastatin. LDL is < 70  Lab Results  Component Value Date   CHOL 120 01/13/2022   HDL 50.90 01/13/2022   LDLCALC 57 01/13/2022   LDLDIRECT 52.8 10/06/2021   TRIG 60.0 01/13/2022   CHOLHDL 2 01/13/2022

## 2022-01-20 DIAGNOSIS — I639 Cerebral infarction, unspecified: Secondary | ICD-10-CM | POA: Diagnosis not present

## 2022-01-20 DIAGNOSIS — G629 Polyneuropathy, unspecified: Secondary | ICD-10-CM | POA: Diagnosis not present

## 2022-01-20 DIAGNOSIS — I10 Essential (primary) hypertension: Secondary | ICD-10-CM | POA: Diagnosis not present

## 2022-01-20 DIAGNOSIS — R0683 Snoring: Secondary | ICD-10-CM | POA: Diagnosis not present

## 2022-01-20 DIAGNOSIS — Q2112 Patent foramen ovale: Secondary | ICD-10-CM | POA: Diagnosis not present

## 2022-01-22 ENCOUNTER — Other Ambulatory Visit: Payer: Self-pay

## 2022-01-22 MED ORDER — FREESTYLE LITE TEST VI STRP: ORAL_STRIP | 1 refills | Status: DC

## 2022-02-02 ENCOUNTER — Ambulatory Visit (INDEPENDENT_AMBULATORY_CARE_PROVIDER_SITE_OTHER): Payer: Medicare PPO | Admitting: Cardiology

## 2022-02-02 ENCOUNTER — Encounter: Payer: Self-pay | Admitting: Cardiology

## 2022-02-02 VITALS — BP 110/78 | HR 96 | Ht 68.0 in | Wt 185.4 lb

## 2022-02-02 DIAGNOSIS — E7800 Pure hypercholesterolemia, unspecified: Secondary | ICD-10-CM | POA: Diagnosis not present

## 2022-02-02 DIAGNOSIS — Q2112 Patent foramen ovale: Secondary | ICD-10-CM

## 2022-02-02 DIAGNOSIS — I639 Cerebral infarction, unspecified: Secondary | ICD-10-CM

## 2022-02-02 DIAGNOSIS — I253 Aneurysm of heart: Secondary | ICD-10-CM

## 2022-02-02 DIAGNOSIS — I10 Essential (primary) hypertension: Secondary | ICD-10-CM | POA: Diagnosis not present

## 2022-02-02 NOTE — Patient Instructions (Signed)
You have been referred to EP, Dr. Quentin Ore.   Medication Instructions:   Your physician recommends that you continue on your current medications as directed. Please refer to the Current Medication list given to you today.   *If you need a refill on your cardiac medications before your next appointment, please call your pharmacy*   Lab Work: None ordered  If you have labs (blood work) drawn today and your tests are completely normal, you will receive your results only by: [place] (if you have MyChart) OR A paper copy in the mail If you have any lab test that is abnormal or we need to change your treatment, we will call you to review the results.   Testing/Procedures: None ordered   Follow-Up: At [facility], you and your health needs are our priority.  As part of our continuing mission to provide you with exceptional heart care, we have created designated Provider Care Teams.  These Care Teams include your primary Cardiologist (physician) and Advanced Practice Providers (APPs -  Physician Assistants and Nurse Practitioners) who all work together to provide you with the care you need, when you need it.  We recommend signing up for the patient portal called "MyChart".  Sign up information is provided on this After Visit Summary.  MyChart is used to connect with patients for Virtual Visits (Telemedicine).  Patients are able to view lab/test results, encounter notes, upcoming appointments, etc.  Non-urgent messages can be sent to your provider as well.   To learn more about what you can do with MyChart, go to [email or web address].    Your next appointment:   1 year(s)  The format for your next appointment:   In Person  Provider:   You may see [name], MD or one of the following Advanced Practice Providers on your designated Care Team:   Murray Hodgkins, NP Christell Faith, PA-C Cadence Kathlen Mody, [place]   Other Instructions N/A  Important Information About  Sugar

## 2022-02-02 NOTE — Progress Notes (Signed)
Cardiology Office Note:    Date:  02/02/2022   ID:  Robert Robertson., DOB Jan 31, 1951, MRN [id number]  PCP:  [name], MD   [facility] Providers Cardiologist:  [name], MD     Referring MD: [name], MD   Chief Complaint  Patient presents with   Follow-up    6 week follow up after TEE. Patient states that he feels fine today. He doesn't have any complaints.  Meds reviewed with patient.     History of Present Illness:    Robert Robertson. is a 71 y.o. male with a hx of hypertension, hyperlipidemia, diabetes, PFO, CVA, former smoker presenting for follow-up.  Previously seen due to CVA.  TEE did show aneurysmal atrial septum, PFO.  Refer to structural team, not a candidate for PFO closure due to comorbidities, advanced age, low R0 PE score.  He feels well, takes statin, aspirin, Plavix as prescribed.  Blood pressure adequately controlled on current meds.  Has no concerns at this time, no palpitations, no bleeding issues with aspirin and Plavix.  Prior notes Echocardiogram 10/06/2021 EF 60 to 65%, saline contrast did reveal possible atrial septal defect. Cardiac monitor 12/01/2021, no evidence for atrial fibrillation or atrial flutter.  Paroxysmal SVT, 2 nonsustained VT noted  Past Medical History:  Diagnosis Date   Allergy    Colon polyp    Diabetes mellitus    Hyperlipidemia    Hypertension     Past Surgical History:  Procedure Laterality Date   COLONOSCOPY  2010   10 mm adenomatous polyp in the descending colon without atypia, Robert Lame, MD   COLONOSCOPY WITH PROPOFOL N/A 07/18/2019   Procedure: COLONOSCOPY WITH PROPOFOL;  Surgeon: Robert Bellow, MD;  Location: [facility];  Service: Endoscopy;  Laterality: N/A;   TEE WITHOUT CARDIOVERSION N/A 12/09/2021   Procedure: TRANSESOPHAGEAL ECHOCARDIOGRAM (TEE);  Surgeon: [name], MD;  Location: [facility] ORS;  Service: Cardiovascular;  Laterality: N/A;    Current  Medications: Current Meds  Medication Sig   aspirin EC 81 MG EC tablet Take 1 tablet (81 mg total) by mouth daily. Swallow whole.   atorvastatin (LIPITOR) 80 MG tablet TAKE 1 TABLET DAILY   Blood Glucose Monitoring Suppl (FREESTYLE FREEDOM LITE) w/Device KIT 1 application by Does not apply route 2 (two) times daily.   clopidogrel (PLAVIX) 75 MG tablet Take 1 tablet (75 mg total) by mouth daily.   glucose blood (FREESTYLE LITE) test strip USE TO CHECK SUGARS TWICE A DAY   hydrochlorothiazide (HYDRODIURIL) 25 MG tablet TAKE 1 TABLET DAILY   JANUVIA 100 MG tablet TAKE 1 TABLET DAILY   Krill Oil 300 MG CAPS Take 300 mg by mouth daily.   Lancets (FREESTYLE) lancets USE TO CHECK SUGAR TWICE A DAY   metFORMIN (GLUCOPHAGE) 1000 MG tablet TAKE 1 TABLET TWICE A DAY WITH MEALS   Multiple Vitamins-Minerals (CENTRUM SILVER ADULT 50+ PO) Take 1 tablet by mouth daily.   Telmisartan-amLODIPine 40-10 MG TABS Take 1 tablet by mouth at bedtime.     Allergies:   Pollen extract   Social History   Socioeconomic History   Marital status: Married    Spouse name: Not on file   Number of children: Not on file   Years of education: Not on file   Highest education level: Not on file  Occupational History   Not on file  Tobacco Use   Smoking status: Former    Packs/day: 0.50    Years:  45.00    Total pack years: 22.50    Types: Cigarettes    Quit date: 2015    Years since quitting: 8.6   Smokeless tobacco: Never  Vaping Use   Vaping Use: Never used  Substance and Sexual Activity   Alcohol use: Yes    Alcohol/week: 2.0 standard drinks of alcohol    Types: 2 Glasses of wine per week    Comment: weekly   Drug use: No   Sexual activity: Not on file  Other Topics Concern   Not on file  Social History Narrative   Not on file   Social Determinants of Health   Financial Resource Strain: Low Risk  (03/25/2021)   Overall Financial Resource Strain (CARDIA)    Difficulty of Paying Living Expenses: Not  hard at all  Food Insecurity: No Food Insecurity (03/25/2021)   Hunger Vital Sign    Worried About Running Out of Food in the Last Year: Never true    Ran Out of Food in the Last Year: Never true  Transportation Needs: No Transportation Needs (03/25/2021)   PRAPARE - Hydrologist (Medical): No    Lack of Transportation (Non-Medical): No  Physical Activity: Unknown (01/26/2018)   Exercise Vital Sign    Days of Exercise per Week: 0 days    Minutes of Exercise per Session: Not on file  Stress: No Stress Concern Present (03/25/2021)   [place]    Feeling of Stress : Not at all  Social Connections: Not on file     Family History: The patient's family history includes Cancer in his sister; Colon cancer in his mother; Diabetes in his brother, maternal grandfather, maternal grandmother, and mother; Stroke in his mother.  ROS:   Please see the history of present illness.     All other systems reviewed and are negative.  EKGs/Labs/Other Studies Reviewed:    The following studies were reviewed today:   EKG:  EKG not ordered today.    Recent Labs: 10/08/2021: B Natriuretic Peptide 9.8 12/07/2021: Hemoglobin 13.2; Platelets 197 01/13/2022: ALT 49; BUN 12; Creatinine, Ser 0.82; Potassium 3.9; Sodium 136  Recent Lipid Panel    Component Value Date/Time   CHOL 120 01/13/2022 0921   TRIG 60.0 01/13/2022 0921   HDL 50.90 01/13/2022 0921   CHOLHDL 2 01/13/2022 0921   VLDL 12.0 01/13/2022 0921   LDLCALC 57 01/13/2022 0921   LDLDIRECT 52.8 10/06/2021 0823     Risk Assessment/Calculations:        Physical Exam:    VS:  BP 110/78 (BP Location: Left Arm, Patient Position: Sitting, Cuff Size: Normal)   Pulse 96   Ht 5' 8"  (1.727 m)   Wt 185 lb 6.4 oz (84.1 kg)   SpO2 98%   BMI 28.19 kg/m     Wt Readings from Last 3 Encounters:  02/02/22 185 lb 6.4 oz (84.1 kg)  01/18/22 185 lb 3.2 oz (84  kg)  12/21/21 186 lb 3.2 oz (84.5 kg)     GEN:  Well nourished, well developed in no acute distress HEENT: Normal NECK: No JVD; No carotid bruits CARDIAC: RRR, no murmurs, rubs, gallops RESPIRATORY:  Clear to auscultation without rales, wheezing or rhonchi  ABDOMEN: Soft, non-tender, non-distended MUSCULOSKELETAL:  No edema; No deformity  SKIN: Warm and dry NEUROLOGIC:  Alert and oriented x 3 PSYCHIATRIC:  Normal affect   ASSESSMENT:    1. Cerebrovascular accident (CVA), unspecified  mechanism ([place])   2. Primary hypertension   3. Pure hypercholesterolemia   4. PFO with atrial septal aneurysm    PLAN:    In order of problems listed above:  Recent CVA, cardiac monitor x2 weeks with no A-fib or flutter.  TEE with PFO, not candidate for PFO closure due to other cardiovascular risk factors, advanced age, low rope score.  Continue aspirin, Plavix, Lipitor.  Refer to EP for ILR consideration to evaluate presence of A-fib. Hypertension, BP controlled.  Continue telmisartan, amlodipine, HCTZ. Hyperlipidemia, cholesterol at goal.  Continue Lipitor 80. PFO, no indication for closure as in #1 above  Follow-up in 1 year.     Medication Adjustments/Labs and Tests Ordered: Current medicines are reviewed at length with the patient today.  Concerns regarding medicines are outlined above.  Orders Placed This Encounter  Procedures   Ambulatory referral to Cardiac Electrophysiology   No orders of the defined types were placed in this encounter.   Patient Instructions  You have been referred to EP, Dr. Quentin Ore.   Medication Instructions:   Your physician recommends that you continue on your current medications as directed. Please refer to the Current Medication list given to you today.   *If you need a refill on your cardiac medications before your next appointment, please call your pharmacy*   Lab Work: None ordered  If you have labs (blood work) drawn today and your tests are  completely normal, you will receive your results only by: [place] (if you have MyChart) OR A paper copy in the mail If you have any lab test that is abnormal or we need to change your treatment, we will call you to review the results.   Testing/Procedures: None ordered   Follow-Up: At [facility], you and your health needs are our priority.  As part of our continuing mission to provide you with exceptional heart care, we have created designated Provider Care Teams.  These Care Teams include your primary Cardiologist (physician) and Advanced Practice Providers (APPs -  Physician Assistants and Nurse Practitioners) who all work together to provide you with the care you need, when you need it.  We recommend signing up for the patient portal called "MyChart".  Sign up information is provided on this After Visit Summary.  MyChart is used to connect with patients for Virtual Visits (Telemedicine).  Patients are able to view lab/test results, encounter notes, upcoming appointments, etc.  Non-urgent messages can be sent to your provider as well.   To learn more about what you can do with MyChart, go to [email or web address].    Your next appointment:   1 year(s)  The format for your next appointment:   In Person  Provider:   You may see [name], MD or one of the following Advanced Practice Providers on your designated Care Team:   Murray Hodgkins, NP Christell Faith, PA-C Cadence Kathlen Mody, [place]   Other Instructions N/A  Important Information About Sugar         Signed, [name], MD  02/02/2022 4:29 PM    [place]

## 2022-03-30 NOTE — Progress Notes (Unsigned)
Electrophysiology Office Note:    Date:  03/31/2022   ID:  Robert Lat., DOB Nov 24, 1950, MRN [id number]  PCP:  [name], MD  [facility] Cardiologist:  [name], MD  [facility] Electrophysiologist:  Vickie Epley, MD   Referring MD: [name], MD   Chief Complaint: Cryptogenic stroke  History of Present Illness:    Robert Plucinski. is a 71 y.o. male who presents for an evaluation of cryptogenic stroke at the request of Dr. Garen Lah. Their medical history includes cryptogenic stroke, hypertension, hyperlipidemia, diabetes, PFO, prior tobacco abuse.  No evidence of prior atrial fibrillation.  He is referred for consideration of loop recorder implant for ongoing atrial fibrillation surveillance.  He is familiar with loop recorders as his wife previously had a loop recorder implanted.  Her loop recorder is no longer functional.  She has dementia.     Past Medical History:  Diagnosis Date   Allergy    Colon polyp    Diabetes mellitus    Hyperlipidemia    Hypertension     Past Surgical History:  Procedure Laterality Date   COLONOSCOPY  2010   10 mm adenomatous polyp in the descending colon without atypia, [name], MD   COLONOSCOPY WITH PROPOFOL N/A 07/18/2019   Procedure: COLONOSCOPY WITH PROPOFOL;  Surgeon: Robert Bellow, MD;  Location: [facility] ENDOSCOPY;  Service: Endoscopy;  Laterality: N/A;   TEE WITHOUT CARDIOVERSION N/A 12/09/2021   Procedure: TRANSESOPHAGEAL ECHOCARDIOGRAM (TEE);  Surgeon: [name], MD;  Location: [facility] ORS;  Service: Cardiovascular;  Laterality: N/A;    Current Medications: Current Meds  Medication Sig   aspirin EC 81 MG EC tablet Take 1 tablet (81 mg total) by mouth daily. Swallow whole.   atorvastatin (LIPITOR) 80 MG tablet TAKE 1 TABLET DAILY   blood glucose meter kit and supplies USE TO CHECK BLOOD SUGARS UP TO TWO TIMES DAILY   Blood Glucose Monitoring Suppl (FREESTYLE FREEDOM  LITE) w/Device KIT 1 application by Does not apply route 2 (two) times daily.   clopidogrel (PLAVIX) 75 MG tablet Take 1 tablet (75 mg total) by mouth daily.   glucose blood (FREESTYLE LITE) test strip USE TO CHECK SUGARS TWICE A DAY   hydrochlorothiazide (HYDRODIURIL) 25 MG tablet TAKE 1 TABLET DAILY   JANUVIA 100 MG tablet TAKE 1 TABLET DAILY   Krill Oil 300 MG CAPS Take 300 mg by mouth daily.   Lancets (FREESTYLE) lancets USE TO CHECK SUGAR TWICE A DAY   metFORMIN (GLUCOPHAGE) 1000 MG tablet TAKE 1 TABLET TWICE A DAY WITH MEALS   Multiple Vitamins-Minerals (CENTRUM SILVER ADULT 50+ PO) Take 1 tablet by mouth daily.   Telmisartan-amLODIPine 40-10 MG TABS Take 1 tablet by mouth at bedtime.     Allergies:   Pollen extract   Social History   Socioeconomic History   Marital status: Married    Spouse name: Not on file   Number of children: Not on file   Years of education: Not on file   Highest education level: Not on file  Occupational History   Not on file  Tobacco Use   Smoking status: Former    Packs/day: 0.50    Years: 45.00    Total pack years: 22.50    Types: Cigarettes    Quit date: 2015    Years since quitting: 8.7   Smokeless tobacco: Never  Vaping Use   Vaping Use: Never used  Substance and Sexual Activity   Alcohol use:  Yes    Alcohol/week: 2.0 standard drinks of alcohol    Types: 2 Glasses of wine per week    Comment: weekly   Drug use: No   Sexual activity: Not on file  Other Topics Concern   Not on file  Social History Narrative   Not on file   Social Determinants of Health   Financial Resource Strain: Low Risk  (03/25/2021)   Overall Financial Resource Strain (CARDIA)    Difficulty of Paying Living Expenses: Not hard at all  Food Insecurity: No Food Insecurity (03/25/2021)   Hunger Vital Sign    Worried About Running Out of Food in the Last Year: Never true    Ran Out of Food in the Last Year: Never true  Transportation Needs: No Transportation Needs  (03/25/2021)   PRAPARE - Hydrologist (Medical): No    Lack of Transportation (Non-Medical): No  Physical Activity: Unknown (01/26/2018)   Exercise Vital Sign    Days of Exercise per Week: 0 days    Minutes of Exercise per Session: Not on file  Stress: No Stress Concern Present (03/25/2021)   [place]    Feeling of Stress : Not at all  Social Connections: Not on file     Family History: The patient's family history includes Cancer in his sister; Colon cancer in his mother; Diabetes in his brother, maternal grandfather, maternal grandmother, and mother; Stroke in his mother.  ROS:   Please see the history of present illness.    All other systems reviewed and are negative.  EKGs/Labs/Other Studies Reviewed:    The following studies were reviewed today:  December 01, 2021 heart monitor Patch Wear Time:  13 days and 22 hours (2023-05-13T18:29:51-0400 to 2023-05-27T16:56:16-0400)   Patient had a min HR of 57 bpm, max HR of 194 bpm, and avg HR of 95 bpm. Predominant underlying rhythm was Sinus Rhythm. 2 Ventricular Tachycardia runs occurred, the run with the fastest interval lasting 15 beats with a max rate of 158 bpm (avg 126 bpm);  the run with the fastest interval was also the longest. 7 Supraventricular Tachycardia runs occurred, the run with the fastest interval lasting 12 beats with a max rate of 194 bpm, the longest lasting 14 beats with an avg rate of 162 bpm. Isolated SVEs  were rare (<1.0%), SVE Couplets were rare (<1.0%), and SVE Triplets were rare (<1.0%). Isolated VEs were rare (<1.0%), VE Couplets were rare (<1.0%), and no VE Triplets were present. Paroxysmal SVT and nonsustained VT noted.  No evidence of A-fib or atrial flutter noted on this monitor to suggest etiology of CVA.  December 09, 2021 transesophageal echo EF 60 to 65% RV normal No left atrial appendage thrombus Mild  MR Interatrial shunting Atrial septal aneurysm  December 07, 2021 EKG Sinus rhythm      Recent Labs: 10/08/2021: B Natriuretic Peptide 9.8 12/07/2021: Hemoglobin 13.2; Platelets 197 01/13/2022: ALT 49; BUN 12; Creatinine, Ser 0.82; Potassium 3.9; Sodium 136  Recent Lipid Panel    Component Value Date/Time   CHOL 120 01/13/2022 0921   TRIG 60.0 01/13/2022 0921   HDL 50.90 01/13/2022 0921   CHOLHDL 2 01/13/2022 0921   VLDL 12.0 01/13/2022 0921   LDLCALC 57 01/13/2022 0921   LDLDIRECT 52.8 10/06/2021 0823    Physical Exam:    VS:  BP 128/78   Pulse 88   Ht $R'5\' 8"'EU$  (1.727 m)   Wt 187  lb (84.8 kg)   SpO2 97%   BMI 28.43 kg/m     Wt Readings from Last 3 Encounters:  03/31/22 187 lb (84.8 kg)  02/02/22 185 lb 6.4 oz (84.1 kg)  01/18/22 185 lb 3.2 oz (84 kg)     GEN:  Well nourished, well developed in no acute distress HEENT: Normal NECK: No JVD; No carotid bruits LYMPHATICS: No lymphadenopathy CARDIAC: RRR, no murmurs, rubs, gallops RESPIRATORY:  Clear to auscultation without rales, wheezing or rhonchi  ABDOMEN: Soft, non-tender, non-distended MUSCULOSKELETAL:  No edema; No deformity  SKIN: Warm and dry NEUROLOGIC:  Alert and oriented x 3 PSYCHIATRIC:  Normal affect       ASSESSMENT:    1. Cerebrovascular accident (CVA), unspecified mechanism ([place])    PLAN:    In order of problems listed above:   #Cryptogenic Stroke Pathophysiology of cryptogenic stroke was discussed in detail during today's clinic appointment. I discussed the role of loop recorder monitoring in patients who have suffered a CVA/TIA. There has been no evidence of AF thus far in the patient's evaluation. Loop recorder monitors were discussed in detail including the implant procedure and its risks. I discussed the monthly monitoring costs associated with loop recorder monitoring. The patient would like to proceed with ILR implant.    Total time spent with patient today 48 minutes. This includes  reviewing records, evaluating the patient and coordinating care.  Medication Adjustments/Labs and Tests Ordered: Current medicines are reviewed at length with the patient today.  Concerns regarding medicines are outlined above.  No orders of the defined types were placed in this encounter.  No orders of the defined types were placed in this encounter.    Signed, Hilton Cork. Quentin Ore, MD, [facility], [facility] 03/31/2022 9:20 AM    Electrophysiology [facility]  ------------------------  SURGEON:  Vickie Epley, MD     PREPROCEDURE DIAGNOSIS:  Cryptogenic stroke    POSTPROCEDURE DIAGNOSIS: Cryptogenic stroke     PROCEDURES:   1. Implantable loop recorder implantation    INTRODUCTION:  Robert Lat. presents with a history of cryptogenic stroke The costs of loop recorder monitoring have been discussed with the patient.    DESCRIPTION OF PROCEDURE:  Informed written consent was obtained.  The patient required no sedation for the procedure today.  Mapping over the patient's chest was performed to identify the area where electrograms were most prominent for ILR recording.  This area was found to be the left parasternal region over the 4th intercostal space. The patients left chest was therefore prepped and draped in the usual sterile fashion. The skin overlying the left parasternal region was infiltrated with lidocaine for local analgesia.  A 0.5-cm incision was made over the left parasternal region over the 3rd intercostal space.  A subcutaneous ILR pocket was fashioned using a combination of sharp and blunt dissection.  A Medtronic Reveal LINQ [id number]) implantable loop recorder was then placed into the pocket  R waves were very prominent and measured >0.56m.  Steri- Strips and a sterile dressing were then applied.  There were no early apparent complications.     CONCLUSIONS:   1. Successful implantation of a implantable loop recorder for Cryptogenic stroke   2. No early apparent complications.   CLysbeth GalasT. LQuentin Ore MD, [facility]

## 2022-03-31 ENCOUNTER — Ambulatory Visit: Payer: Medicare PPO | Attending: Cardiology | Admitting: Cardiology

## 2022-03-31 ENCOUNTER — Encounter: Payer: Self-pay | Admitting: Cardiology

## 2022-03-31 VITALS — BP 128/78 | HR 88 | Ht 68.0 in | Wt 187.0 lb

## 2022-03-31 DIAGNOSIS — I639 Cerebral infarction, unspecified: Secondary | ICD-10-CM | POA: Diagnosis not present

## 2022-03-31 NOTE — Patient Instructions (Signed)
Medication Instructions:  Your physician recommends that you continue on your current medications as directed. Please refer to the Current Medication list given to you today.  Labwork: None ordered.  Testing/Procedures: None ordered.  Follow-Up:  Your physician wants you to follow-up in: As needed with Dr. Quentin Ore in person he will follow your device remotely.     Implantable Loop Recorder Placement, Care After This sheet gives you information about how to care for yourself after your procedure. Your health care provider may also give you more specific instructions. If you have problems or questions, contact your health care provider. What can I expect after the procedure? After the procedure, it is common to have: Soreness or discomfort near the incision. Some swelling or bruising near the incision.  Follow these instructions at home: Incision care  Monitor your cardiac device site for redness, swelling, and drainage. Call the device clinic at [phone] if you experience these symptoms or fever/chills.  Keep the large square bandage on your site for 24 hours and then you may remove it yourself. Keep the steri-strips underneath in place.   You may shower after 72 hours / 3 days from your procedure with the steri-strips in place. They will usually fall off on their own, or may be removed after 10 days. Pat dry.   Avoid lotions, ointments, or perfumes over your incision until it is well-healed.  Please do not submerge in water until your site is completely healed.   Your device is MRI compatible.   Remote monitoring is used to monitor your cardiac device from home. This monitoring is scheduled every month by our office. It allows [place] to keep an eye on the function of your device to ensure it is working properly.  If your wound site starts to bleed apply pressure.    For help with the monitor please call Medtronic Monitor Support Specialist directly at [phone].    If  you have any questions/concerns please call the device clinic at [phone].  Activity  Return to your normal activities.  General instructions Follow instructions from your health care provider about how to manage your implantable loop recorder and transmit the information. Learn how to activate a recording if this is necessary for your type of device. You may go through a metal detection gate, and you may let someone hold a metal detector over your chest. Show your ID card if needed. Do not have an MRI unless you check with your health care provider first. Take over-the-counter and prescription medicines only as told by your health care provider. Keep all follow-up visits as told by your health care provider. This is important. Contact a health care provider if: You have redness, swelling, or pain around your incision. You have a fever. You have pain that is not relieved by your pain medicine. You have triggered your device because of fainting (syncope) or because of a heartbeat that feels like it is racing, slow, fluttering, or skipping (palpitations). Get help right away if you have: Chest pain. Difficulty breathing. Summary After the procedure, it is common to have soreness or discomfort near the incision. Change your dressing as told by your health care provider. Follow instructions from your health care provider about how to manage your implantable loop recorder and transmit the information. Keep all follow-up visits as told by your health care provider. This is important. This information is not intended to replace advice given to you by your health care provider. Make sure you discuss any questions  you have with your health care provider. Document Released: 05/26/2015 Document Revised: 07/30/2017 Document Reviewed: 07/30/2017 Elsevier Patient Education  2020 [employer].

## 2022-04-05 ENCOUNTER — Ambulatory Visit (INDEPENDENT_AMBULATORY_CARE_PROVIDER_SITE_OTHER): Payer: Medicare PPO

## 2022-04-05 VITALS — BP 114/75 | HR 84 | Ht 68.0 in | Wt 187.0 lb

## 2022-04-05 DIAGNOSIS — Z0000 Encounter for general adult medical examination without abnormal findings: Secondary | ICD-10-CM | POA: Diagnosis not present

## 2022-04-05 NOTE — Patient Instructions (Addendum)
Robert Robertson , Thank you for taking time to come for your Medicare Wellness Visit. I appreciate your ongoing commitment to your health goals. Please review the following plan we discussed and let me know if I can assist you in the future.   These are the goals we discussed:  Goals       Patient Stated     Follow up with Primary Care Provider (pt-stated)      Monitor A1C twice daily          This is a list of the screening recommended for you and due dates:  Health Maintenance  Topic Date Due   Complete foot exam   03/18/2022   Hemoglobin A1C  07/16/2022   Eye exam for diabetics  12/08/2022   Yearly kidney function blood test for diabetes  01/14/2023   Yearly kidney health urinalysis for diabetes  01/14/2023   Colon Cancer Screening  07/17/2024   Tetanus Vaccine  08/07/2024   Pneumonia Vaccine  Completed   Flu Shot  Completed   COVID-19 Vaccine  Completed   Hepatitis C Screening: USPSTF Recommendation to screen - Ages 18-79 yo.  Completed   Zoster (Shingles) Vaccine  Completed   HPV Vaccine  Aged Out    Advanced directives: End of life planning; Advanced aging; Advanced directives discussed.  No HCPOA/Living Will.  Additional information declined at this time. Available in office as needed.   Next appointment: Follow up in one year for your annual wellness visit.   Preventive Care 19 Years and Older, Male  Preventive care refers to lifestyle choices and visits with your health care provider that can promote health and wellness. What does preventive care include? A yearly physical exam. This is also called an annual well check. Dental exams once or twice a year. Routine eye exams. Ask your health care provider how often you should have your eyes checked. Personal lifestyle choices, including: Daily care of your teeth and gums. Regular physical activity. Eating a healthy diet. Avoiding tobacco and drug use. Limiting alcohol use. Practicing safe sex. Taking low doses of  aspirin every day. Taking vitamin and mineral supplements as recommended by your health care provider. What happens during an annual well check? The services and screenings done by your health care provider during your annual well check will depend on your age, overall health, lifestyle risk factors, and family history of disease. Counseling  Your health care provider may ask you questions about your: Alcohol use. Tobacco use. Drug use. Emotional well-being. Home and relationship well-being. Sexual activity. Eating habits. History of falls. Memory and ability to understand (cognition). Work and work Statistician. Screening  You may have the following tests or measurements: Height, weight, and BMI. Blood pressure. Lipid and cholesterol levels. These may be checked every 5 years, or more frequently if you are over 68 years old. Skin check. Lung cancer screening. You may have this screening every year starting at age 35 if you have a 30-pack-year history of smoking and currently smoke or have quit within the past 15 years. Fecal occult blood test (FOBT) of the stool. You may have this test every year starting at age 30. Flexible sigmoidoscopy or colonoscopy. You may have a sigmoidoscopy every 5 years or a colonoscopy every 10 years starting at age 3. Prostate cancer screening. Recommendations will vary depending on your family history and other risks. Hepatitis C blood test. Hepatitis B blood test. Sexually transmitted disease (STD) testing. Diabetes screening. This is done by checking  your blood sugar (glucose) after you have not eaten for a while (fasting). You may have this done every 1-3 years. Abdominal aortic aneurysm (AAA) screening. You may need this if you are a current or former smoker. Osteoporosis. You may be screened starting at age 17 if you are at high risk. Talk with your health care provider about your test results, treatment options, and if necessary, the need for more  tests. Vaccines  Your health care provider may recommend certain vaccines, such as: Influenza vaccine. This is recommended every year. Tetanus, diphtheria, and acellular pertussis (Tdap, Td) vaccine. You may need a Td booster every 10 years. Zoster vaccine. You may need this after age 40. Pneumococcal 13-valent conjugate (PCV13) vaccine. One dose is recommended after age 4. Pneumococcal polysaccharide (PPSV23) vaccine. One dose is recommended after age 67. Talk to your health care provider about which screenings and vaccines you need and how often you need them. This information is not intended to replace advice given to you by your health care provider. Make sure you discuss any questions you have with your health care provider. Document Released: 07/11/2015 Document Revised: 03/03/2016 Document Reviewed: 04/15/2015 Elsevier Interactive Patient Education  2017 [place] Prevention in the Home Falls can cause injuries. They can happen to people of all ages. There are many things you can do to make your home safe and to help prevent falls. What can I do on the outside of my home? Regularly fix the edges of walkways and driveways and fix any cracks. Remove anything that might make you trip as you walk through a door, such as a raised step or threshold. Trim any bushes or trees on the path to your home. Use bright outdoor lighting. Clear any walking paths of anything that might make someone trip, such as rocks or tools. Regularly check to see if handrails are loose or broken. Make sure that both sides of any steps have handrails. Any raised decks and porches should have guardrails on the edges. Have any leaves, snow, or ice cleared regularly. Use sand or salt on walking paths during winter. Clean up any spills in your garage right away. This includes oil or grease spills. What can I do in the bathroom? Use night lights. Install grab bars by the toilet and in the tub and shower.  Do not use towel bars as grab bars. Use non-skid mats or decals in the tub or shower. If you need to sit down in the shower, use a plastic, non-slip stool. Keep the floor dry. Clean up any water that spills on the floor as soon as it happens. Remove soap buildup in the tub or shower regularly. Attach bath mats securely with double-sided non-slip rug tape. Do not have throw rugs and other things on the floor that can make you trip. What can I do in the bedroom? Use night lights. Make sure that you have a light by your bed that is easy to reach. Do not use any sheets or blankets that are too big for your bed. They should not hang down onto the floor. Have a firm chair that has side arms. You can use this for support while you get dressed. Do not have throw rugs and other things on the floor that can make you trip. What can I do in the kitchen? Clean up any spills right away. Avoid walking on wet floors. Keep items that you use a lot in easy-to-reach places. If you need to reach something  above you, use a strong step stool that has a grab bar. Keep electrical cords out of the way. Do not use floor polish or wax that makes floors slippery. If you must use wax, use non-skid floor wax. Do not have throw rugs and other things on the floor that can make you trip. What can I do with my stairs? Do not leave any items on the stairs. Make sure that there are handrails on both sides of the stairs and use them. Fix handrails that are broken or loose. Make sure that handrails are as long as the stairways. Check any carpeting to make sure that it is firmly attached to the stairs. Fix any carpet that is loose or worn. Avoid having throw rugs at the top or bottom of the stairs. If you do have throw rugs, attach them to the floor with carpet tape. Make sure that you have a light switch at the top of the stairs and the bottom of the stairs. If you do not have them, ask someone to add them for you. What else  can I do to help prevent falls? Wear shoes that: Do not have high heels. Have rubber bottoms. Are comfortable and fit you well. Are closed at the toe. Do not wear sandals. If you use a stepladder: Make sure that it is fully opened. Do not climb a closed stepladder. Make sure that both sides of the stepladder are locked into place. Ask someone to hold it for you, if possible. Clearly mark and make sure that you can see: Any grab bars or handrails. First and last steps. Where the edge of each step is. Use tools that help you move around (mobility aids) if they are needed. These include: Canes. Walkers. Scooters. Crutches. Turn on the lights when you go into a dark area. Replace any light bulbs as soon as they burn out. Set up your furniture so you have a clear path. Avoid moving your furniture around. If any of your floors are uneven, fix them. If there are any pets around you, be aware of where they are. Review your medicines with your doctor. Some medicines can make you feel dizzy. This can increase your chance of falling. Ask your doctor what other things that you can do to help prevent falls. This information is not intended to replace advice given to you by your health care provider. Make sure you discuss any questions you have with your health care provider. Document Released: 04/10/2009 Document Revised: 11/20/2015 Document Reviewed: 07/19/2014 Elsevier Interactive Patient Education  2017 [employer].

## 2022-04-05 NOTE — Progress Notes (Addendum)
Subjective:   Robert Robertson. is a 70 y.o. male who presents for Medicare Annual/Subsequent preventive examination.  Review of Systems    No ROS.  Medicare Wellness Virtual Visit.  Visual/audio telehealth visit, UTA vital signs.   See social history for additional risk factors.   Cardiac Risk Factors include: advanced age (>30mn, >>18women);male gender;diabetes mellitus;hypertension     Objective:    Today's Vitals   04/05/22 1003  BP: 114/75  Pulse: 84  Weight: 187 lb (84.8 kg)  Height: _0  (1.727 m)   Body mass index is 28.43 kg/m.     04/05/2022   10:20 AM 12/09/2021    7:34 AM 10/08/2021    5:30 PM 10/06/2021    5:20 PM 03/25/2021    3:52 PM 02/06/2020    9:39 AM 07/18/2019    7:42 AM  Advanced Directives  Does Patient Have a Medical Advance Directive? No Yes No Yes No Yes No  Type of AScientist, physiologicalof [place] will Healthcare Power of [place]  Does patient want to make changes to medical advance directive?    No - Patient declined  No - Patient declined   Copy of HReedsin Chart?   No - copy requested No - copy requested  No - copy requested   Would patient like information on creating a medical advance directive? No - Patient declined  No - Patient declined  Yes (MAU/Ambulatory/Procedural Areas - Information given)  No - Patient declined    Current Medications (verified) Outpatient Encounter Medications as of 04/05/2022  Medication Sig   aspirin EC 81 MG EC tablet Take 1 tablet (81 mg total) by mouth daily. Swallow whole.   atorvastatin (LIPITOR) 80 MG tablet TAKE 1 TABLET DAILY   blood glucose meter kit and supplies USE TO CHECK BLOOD SUGARS UP TO TWO TIMES DAILY   Blood Glucose Monitoring Suppl (FREESTYLE FREEDOM LITE) w/Device KIT 1 application by Does not apply route 2 (two) times daily.   clopidogrel (PLAVIX) 75 MG tablet Take 1 tablet (75 mg total) by mouth daily.    glucose blood (FREESTYLE LITE) test strip USE TO CHECK SUGARS TWICE A DAY   hydrochlorothiazide (HYDRODIURIL) 25 MG tablet TAKE 1 TABLET DAILY   JANUVIA 100 MG tablet TAKE 1 TABLET DAILY   Krill Oil 300 MG CAPS Take 300 mg by mouth daily.   Lancets (FREESTYLE) lancets USE TO CHECK SUGAR TWICE A DAY   metFORMIN (GLUCOPHAGE) 1000 MG tablet TAKE 1 TABLET TWICE A DAY WITH MEALS   Multiple Vitamins-Minerals (CENTRUM SILVER ADULT 50+ PO) Take 1 tablet by mouth daily.   Telmisartan-amLODIPine 40-10 MG TABS Take 1 tablet by mouth at bedtime.   No facility-administered encounter medications on file as of 04/05/2022.    Allergies (verified) Pollen extract   History: Past Medical History:  Diagnosis Date   Allergy    Colon polyp    Diabetes mellitus    Hyperlipidemia    Hypertension    Past Surgical History:  Procedure Laterality Date   COLONOSCOPY  2010   10 mm adenomatous polyp in the descending colon without atypia, DLucilla Lame MD   COLONOSCOPY WITH PROPOFOL N/A 07/18/2019   Procedure: COLONOSCOPY WITH PROPOFOL;  Surgeon: BRobert Bellow MD;  [facility];  Service: Endoscopy;  Laterality: N/A;   TEE WITHOUT CARDIOVERSION N/A 12/09/2021   Procedure: TRANSESOPHAGEAL ECHOCARDIOGRAM (TEE);  Surgeon: AKate Sable MD;  Location: [facility];  Service: Cardiovascular;  Laterality: N/A;   Family History  Problem Relation Age of Onset   Diabetes Mother    Stroke Mother    Colon cancer Mother    Cancer Sister        brain tumor   Diabetes Brother    Diabetes Maternal Grandmother    Diabetes Maternal Grandfather    Social History   Socioeconomic History   Marital status: Married    Spouse name: Not on file   Number of children: Not on file   Years of education: Not on file   Highest education level: Not on file  Occupational History   Not on file  Tobacco Use   Smoking status: Former    Packs/day: 0.50    Years: 45.00    Total pack years: 22.50    Types:  Cigarettes    Quit date: 2015    Years since quitting: 8.7   Smokeless tobacco: Never  Vaping Use   Vaping Use: Never used  Substance and Sexual Activity   Alcohol use: Yes    Alcohol/week: 2.0 standard drinks of alcohol    Types: 2 Glasses of wine per week    Comment: weekly   Drug use: No   Sexual activity: Not on file  Other Topics Concern   Not on file  Social History Narrative   Not on file   Social Determinants of Health   Financial Resource Strain: Low Risk  (04/05/2022)   Overall Financial Resource Strain (CARDIA)    Difficulty of Paying Living Expenses: Not hard at all  Food Insecurity: No Food Insecurity (04/05/2022)   Hunger Vital Sign    Worried About Running Out of Food in the Last Year: Never true    Ran Out of Food in the Last Year: Never true  Transportation Needs: No Transportation Needs (04/05/2022)   PRAPARE - Hydrologist (Medical): No    Lack of Transportation (Non-Medical): No  Physical Activity: Unknown (01/26/2018)   Exercise Vital Sign    Days of Exercise per Week: 0 days    Minutes of Exercise per Session: Not on file  Stress: No Stress Concern Present (04/05/2022)   [place]    Feeling of Stress : Not at all  Social Connections: Not on file    Tobacco Counseling Counseling given: Not Answered   Clinical Intake:  Pre-visit preparation completed: Yes        Diabetes: Yes (Followed by PCP)  How often do you need to have someone help you when you read instructions, pamphlets, or other written materials from your doctor or pharmacy?: 1 - Never  Nutrition Risk Assessment: Has the patient had any N/V/D within the last 2 months?  No  Does the patient have any non-healing wounds?  No  Has the patient had any unintentional weight loss or weight gain?  No   Diabetes: Is the patient diabetic?  Yes  If diabetic, was a CBG obtained today?  Yes , FBS  127 How often do you monitor your CBG's? Daily.  Foot exam notes next office visit with PCP.   Financial Strains and Diabetes Management: Are you having any financial strains with the device, your supplies or your medication? No .  Does the patient want to be seen by Chronic Care Management for management of their diabetes?  No  Would the patient like to be referred to a Nutritionist or  for Diabetic Management?  No   Interpreter Needed?: No      Activities of Daily Living    04/05/2022   10:08 AM 12/09/2021    7:30 AM  In your present state of health, do you have any difficulty performing the following activities:  Hearing? 0 0  Vision? 0 0  Difficulty concentrating or making decisions? 0 0  Walking or climbing stairs? 0 0  Dressing or bathing? 0 0  Doing errands, shopping? 0   Preparing Food and eating ? N   Using the Toilet? N   In the past six months, have you accidently leaked urine? N   Do you have problems with loss of bowel control? N   Managing your Medications? N   Managing your Finances? N   Housekeeping or managing your Housekeeping? N     Patient Care Team: [name], MD as PCP - General (Internal Medicine) [name], MD as PCP - Cardiology (Cardiology) Vickie Epley, MD as PCP - Electrophysiology (Cardiology) [name], MD (Internal Medicine) Bary Castilla Forest Gleason, MD (General Surgery)  Indicate any recent Medical Services you may have received from other than [facility] providers in the past year (date may be approximate).     Assessment:   This is a routine wellness examination for Robert Robertson.  I connected with  Robert Robertson. on 04/05/22 by a audio enabled telemedicine application and verified that I am speaking with the correct person using two identifiers.  Patient Location: Home  Provider Location: Office/Clinic  I discussed the limitations of evaluation and management by telemedicine. The patient expressed understanding and  agreed to proceed.   Hearing/Vision screen Hearing Screening - Comments:: Patient is able to hear conversational tones without difficulty.  No issues reported.   Vision Screening - Comments:: Followed by [facility]  Wears corrective lenses.  No retinopathy reported. They have seen their ophthalmologist. Glaucoma suspect; no drops in use. Visits every 6 months.   Dietary issues and exercise activities discussed: Current Exercise Habits: Home exercise routine Healthy diet Good water intake   Goals Addressed               This Visit's Progress     Patient Stated     Follow up with Primary Care Provider (pt-stated)   On track     Monitor A1C twice daily         Depression Screen    04/05/2022   10:08 AM 01/18/2022    8:10 AM 10/08/2021   11:15 AM 09/16/2021    8:11 AM 03/25/2021    3:37 PM 03/18/2021   10:12 AM 12/12/2020   11:44 AM  PHQ 2/9 Scores  PHQ - 2 Score 0 0 0 0 0 0 0  PHQ- 9 Score   0 0       Fall Risk    04/05/2022   10:08 AM 01/18/2022    8:03 AM 10/08/2021   11:14 AM 09/16/2021    8:11 AM 03/25/2021    3:53 PM  Fall Risk   Falls in the past year? 0 0 0 0 0  Number falls in past yr: 0   0 0  Injury with Fall? 0   0   Risk for fall due to :  No Fall Risks No Fall Risks No Fall Risks   Follow up _0     FALL RISK PREVENTION PERTAINING  TO THE HOME: Home free of loose throw rugs in walkways, pet beds, electrical cords, etc? Yes  Adequate lighting in your home to reduce risk of falls? Yes   ASSISTIVE DEVICES UTILIZED TO PREVENT FALLS: Life alert? No  Use of a cane, walker or w/c? No   TIMED UP AND GO: Was the test performed? No .   Cognitive Function:    02/06/2020   10:25 AM 01/26/2018   10:14 AM 12/28/2016    9:27 AM  MMSE - Mini Mental State Exam  Not completed: Unable to complete    Orientation to time  5 5   Orientation to Place  5 5  Registration  3 3  Attention/ Calculation  5 5  Recall  3 3  Language- name 2 objects  2 2  Language- repeat  1 1  Language- follow 3 step command  3 3  Language- read & follow direction  1 1  Write a sentence  1 1  Copy design  1 1  Total score  30 30        04/05/2022   10:11 AM 02/05/2019    9:43 AM  6CIT Screen  What Year? 0 points 0 points  What month? 0 points 0 points  What time? 0 points 0 points  Count back from 20 0 points 0 points  Months in reverse 0 points 0 points  Repeat phrase 2 points 0 points  Total Score 2 points 0 points    Immunizations Immunization History  Administered Date(s) Administered   Fluad Quad(high Dose 65+) 07/03/2019   Hep A / Hep B 05/07/2015, 06/10/2015, 11/04/2015   Influenza Split 04/28/2013   Influenza, High Dose Seasonal PF 02/27/2016, 07/07/2017, 06/08/2018, 03/17/2021   Influenza,inj,Quad PF,6+ Mos 05/08/2014, 05/07/2015   Influenza-Unspecified 03/25/2020, 03/29/2022   Moderna Covid-19 Vaccine Bivalent Booster 76yr & up 03/29/2022   PFIZER(Purple Top)SARS-COV-2 Vaccination 08/09/2019, 09/03/2019, 03/25/2020, 10/17/2020   Pfizer Covid-19 Vaccine Bivalent Booster 148yr& up 03/17/2021   Pneumococcal Conjugate-13 08/15/2015   Pneumococcal Polysaccharide-23 05/08/2014, 07/10/2019   Tdap 08/07/2014   Zoster Recombinat (Shingrix) 09/26/2019, 12/05/2019   Zoster, Live 02/27/2016   Screening Tests Health Maintenance  Topic Date Due   FOOT EXAM  03/18/2022   HEMOGLOBIN A1C  07/16/2022   OPHTHALMOLOGY EXAM  12/08/2022   Diabetic kidney evaluation - GFR measurement  01/14/2023   Diabetic kidney evaluation - Urine ACR  01/14/2023   COLONOSCOPY (Pts 45-4920yrnsurance coverage will need to be confirmed)  07/17/2024   TETANUS/TDAP  08/07/2024   Pneumonia Vaccine 65+60ears old  Completed   INFLUENZA VACCINE  Completed   COVID-19 Vaccine  Completed   Hepatitis C Screening  Completed   Zoster Vaccines-  Shingrix  Completed   HPV VACCINES  Aged Out   Health Maintenance Health Maintenance Due  Topic Date Due   FOOT EXAM  03/18/2022   Lung Cancer Screening: completed 08/2021.   Hepatitis C Screening: Completed 2016  Vision Screening: Recommended annual ophthalmology exams for early detection of glaucoma and other disorders of the eye.  Dental Screening: Recommended annual dental exams for proper oral hygiene  Community Resource Referral / Chronic Care Management: CRR required this visit?  No   CCM required this visit?  No      Plan:     I have personally reviewed and noted the following in the patient's chart:   Medical and social history Use of alcohol, tobacco or illicit drugs  Current medications and supplements including  opioid prescriptions. Patient is not currently taking opioid prescriptions. Functional ability and status Nutritional status Physical activity Advanced directives List of other physicians Hospitalizations, surgeries, and ER visits in previous 12 months Vitals Screenings to include cognitive, depression, and falls Referrals and appointments  In addition, I have reviewed and discussed with patient certain preventive protocols, quality metrics, and best practice recommendations. A written personalized care plan for preventive services as well as general preventive health recommendations were provided to patient.     OBrien-Blaney, Robt Okuda L, LPN   80/07/2334     I have reviewed the above information and agree with above.   Deborra Medina, MD

## 2022-04-08 ENCOUNTER — Telehealth: Payer: Self-pay | Admitting: Cardiology

## 2022-04-08 NOTE — Telephone Encounter (Signed)
Patient is concerned that he has a bruise is under to the left of where he had his loop recorder placed.  Patient stated he his having a yellow discoloration in this all of this area for the last 4 days.  Patient wants to know if he should be concerned about this or is this normal.

## 2022-04-09 NOTE — Telephone Encounter (Signed)
Returned call to patient. Advised patient bruising is normal and yellow color shows signs of improvement. Patient appreciative of call. Advised if he has further questions or concerns to please return call.

## 2022-04-14 ENCOUNTER — Telehealth: Payer: Self-pay | Admitting: Cardiology

## 2022-04-14 NOTE — Telephone Encounter (Signed)
Notified the patient that it is okay to take off his steri-strips.  Verbalized understanding.

## 2022-04-14 NOTE — Telephone Encounter (Signed)
Pt states that he had monitor put in on 10/4 and would like to know when he is able to take the bandages off. Please advise

## 2022-04-16 ENCOUNTER — Other Ambulatory Visit (INDEPENDENT_AMBULATORY_CARE_PROVIDER_SITE_OTHER): Payer: Medicare PPO

## 2022-04-16 DIAGNOSIS — R040 Epistaxis: Secondary | ICD-10-CM

## 2022-04-16 DIAGNOSIS — E782 Mixed hyperlipidemia: Secondary | ICD-10-CM | POA: Diagnosis not present

## 2022-04-16 DIAGNOSIS — I10 Essential (primary) hypertension: Secondary | ICD-10-CM

## 2022-04-16 DIAGNOSIS — E1169 Type 2 diabetes mellitus with other specified complication: Secondary | ICD-10-CM

## 2022-04-16 LAB — COMPREHENSIVE METABOLIC PANEL
ALT: 39 U/L (ref 0–53)
AST: 29 U/L (ref 0–37)
Albumin: 4.4 g/dL (ref 3.5–5.2)
Alkaline Phosphatase: 59 U/L (ref 39–117)
BUN: 12 mg/dL (ref 6–23)
CO2: 25 mEq/L (ref 19–32)
Calcium: 9.6 mg/dL (ref 8.4–10.5)
Chloride: 101 mEq/L (ref 96–112)
Creatinine, Ser: 0.85 mg/dL (ref 0.40–1.50)
GFR: 87.48 mL/min (ref >60.00)
Glucose, Bld: 121 mg/dL — ABNORMAL HIGH (ref 70–99)
Potassium: 4.3 mEq/L (ref 3.5–5.1)
Sodium: 138 mEq/L (ref 135–145)
Total Bilirubin: 0.8 mg/dL (ref 0.2–1.2)
Total Protein: 6.9 g/dL (ref 6.0–8.3)

## 2022-04-16 LAB — CBC WITH DIFFERENTIAL/PLATELET
Basophils Absolute: 0 10*3/uL (ref 0.0–0.1)
Basophils Relative: 0.5 % (ref 0.0–3.0)
Eosinophils Absolute: 0.1 10*3/uL (ref 0.0–0.7)
Eosinophils Relative: 1.9 % (ref 0.0–5.0)
HCT: 39.9 % (ref 39.0–52.0)
Hemoglobin: 12.8 g/dL — ABNORMAL LOW (ref 13.0–17.0)
Lymphocytes Relative: 28.9 % (ref 12.0–46.0)
Lymphs Abs: 1.7 10*3/uL (ref 0.7–4.0)
MCHC: 32.2 g/dL (ref 30.0–36.0)
MCV: 87.6 fl (ref 78.0–100.0)
Monocytes Absolute: 0.6 10*3/uL (ref 0.1–1.0)
Monocytes Relative: 10.6 % (ref 3.0–12.0)
Neutro Abs: 3.3 10*3/uL (ref 1.4–7.7)
Neutrophils Relative %: 58.1 % (ref 43.0–77.0)
Platelets: 189 10*3/uL (ref 150.0–400.0)
RBC: 4.55 Mil/uL (ref 4.22–5.81)
RDW: 13.9 % (ref 11.5–15.5)
WBC: 5.7 10*3/uL (ref 4.0–10.5)

## 2022-04-16 LAB — LIPID PANEL
Cholesterol: 131 mg/dL (ref 0–200)
HDL: 57.8 mg/dL (ref >39.00)
LDL Cholesterol: 59 mg/dL (ref 0–99)
NonHDL: 73.4
Total CHOL/HDL Ratio: 2
Triglycerides: 71 mg/dL (ref 0.0–149.0)
VLDL: 14.2 mg/dL (ref 0.0–40.0)

## 2022-04-16 LAB — HEMOGLOBIN A1C: Hgb A1c MFr Bld: 6.9 % — ABNORMAL HIGH (ref 4.6–6.5)

## 2022-04-19 ENCOUNTER — Telehealth: Payer: Self-pay | Admitting: *Deleted

## 2022-04-19 NOTE — Telephone Encounter (Signed)
   Pre-operative Risk Assessment    Patient Name: Robert Robertson.  DOB: 02/02/1951 MRN: [id number]     Request for Surgical Clearance    Procedure:   1 DENTAL FILLING ONLY PER THE CLEARANCE FORM  ; NO EXTRACTIONS TO BE DONE  Date of Surgery:  Clearance TBD                                 Surgeon:  DR. Martinique THOMAS Surgeon's Group or Practice Name:  [facility]  Phone number:  [phone] Fax number:  [phone]   Type of Clearance Requested:   - Medical  - Pharmacy:  Hold Aspirin and Clopidogrel (Plavix)     Type of Anesthesia:  Local    Additional requests/questions:    [name]   04/19/2022, 11:31 AM

## 2022-04-19 NOTE — Telephone Encounter (Signed)
    Primary Cardiologist: [name], MD  Chart reviewed as part of pre-operative protocol coverage. Simple dental extractions are considered low risk procedures per guidelines and generally do not require any specific cardiac clearance. It is also generally accepted that for simple extractions and dental cleanings, there is no need to interrupt blood thinner therapy.   SBE prophylaxis is not required for the patient.  I will route this recommendation to the requesting party via Epic fax function and remove from pre-op pool.  Please call with questions.  Deberah Pelton, NP 04/19/2022, 11:43 AM

## 2022-04-21 ENCOUNTER — Ambulatory Visit: Payer: Medicare PPO | Admitting: Internal Medicine

## 2022-04-26 ENCOUNTER — Encounter: Payer: Self-pay | Admitting: Internal Medicine

## 2022-04-26 ENCOUNTER — Ambulatory Visit (INDEPENDENT_AMBULATORY_CARE_PROVIDER_SITE_OTHER): Payer: Medicare PPO | Admitting: Internal Medicine

## 2022-04-26 VITALS — BP 132/78 | HR 95 | Temp 97.6°F | Ht 68.0 in | Wt 188.0 lb

## 2022-04-26 DIAGNOSIS — I10 Essential (primary) hypertension: Secondary | ICD-10-CM | POA: Diagnosis not present

## 2022-04-26 DIAGNOSIS — J439 Emphysema, unspecified: Secondary | ICD-10-CM

## 2022-04-26 DIAGNOSIS — E782 Mixed hyperlipidemia: Secondary | ICD-10-CM | POA: Diagnosis not present

## 2022-04-26 DIAGNOSIS — I639 Cerebral infarction, unspecified: Secondary | ICD-10-CM | POA: Diagnosis not present

## 2022-04-26 DIAGNOSIS — E1169 Type 2 diabetes mellitus with other specified complication: Secondary | ICD-10-CM

## 2022-04-26 DIAGNOSIS — R040 Epistaxis: Secondary | ICD-10-CM | POA: Diagnosis not present

## 2022-04-26 LAB — PROTIME-INR
INR: 1 ratio (ref 0.8–1.0)
Prothrombin Time: 10.8 s (ref 9.6–13.1)

## 2022-04-26 LAB — APTT: aPTT: 29.6 s (ref 25.4–36.8)

## 2022-04-26 NOTE — Assessment & Plan Note (Signed)
Centrilobular and paraseptal, mild by chest CT.  Secondary to tobacco abuse.   He remains asymptomatic with daily exercise.  Advised to obtain the RSV  Vaccine

## 2022-04-26 NOTE — Assessment & Plan Note (Signed)
BS are at goal on metformin and  Januvia.  He is tolerating ARB . Statin and  ASA( resumed due to acute CVA October 06 2021) .  Advised to change monitoring to post prandial times .   Lab Results  Component Value Date   HGBA1C 6.9 (H) 04/16/2022   Lab Results  Component Value Date   LABMICR See below: 08/05/2021   LABMICR See below: 08/04/2020   MICROALBUR 1.5 01/13/2022   MICROALBUR 0.9 03/16/2021     Lab Results  Component Value Date   CHOL 131 04/16/2022   HDL 57.80 04/16/2022   LDLCALC 59 04/16/2022   LDLDIRECT 52.8 10/06/2021   TRIG 71.0 04/16/2022   CHOLHDL 2 04/16/2022

## 2022-04-26 NOTE — Assessment & Plan Note (Signed)
Well controlled on current regimen of hctz  Amlodipine and telmisartan. Renal function stable, no changes today.

## 2022-04-26 NOTE — Assessment & Plan Note (Signed)
encouarged to increase hydration of nasal passages to twice daily

## 2022-04-26 NOTE — Assessment & Plan Note (Addendum)
Evidence of acute and chronic infarcts were noted on admission MRI brain from April 11-12.  He has a PFO.  He will take asa and plavix  indefinitely, will continue his high dose statin .  He has good BP control and his diabetes is controlled.  He has  been referred to neurology and cardiology and had  A LOOP monitor placed in early October.  He had a normal sleep study in May at the [facility]

## 2022-04-26 NOTE — Progress Notes (Signed)
Subjective:  Patient ID: Robert Lat., male    DOB: 12/05/1950  Age: 71 y.o. MRN: [id number]  CC: The primary encounter diagnosis was Essential hypertension. Diagnoses of DM type 2 with diabetic mixed hyperlipidemia ([place]), Recurrent epistaxis, Cerebrovascular accident (CVA), unspecified mechanism ([place]), Epistaxis, and Pulmonary emphysema, unspecified emphysema type ([place]) were also pertinent to this visit.   HPI Robert Ra. presents for follow up on type 2 DM, hypertension , etc  Chief Complaint  Patient presents with   Follow-up    3 month follow up on diabetes   1) T2DM:  He feels generally well, is exercising several times per week and checking blood sugars once daily in a fasting state .  BS have been under 120 fasting .  Denies any recent hypoglyemic events.  Taking his medications as directed. Following a carbohydrate modified diet 6 days per week. Denies numbness, burning and tingling of extremities. Appetite is good.    2) Hypertension: patient checks blood pressure twice weekly at home.  Readings have been < 130/80 100% of the time.   . Patient is following a reduced salt diet most days and is taking medications as prescribed   3) Recurrent epistaxis:  he has had 6 episodes in the last 6 weeks. Takes 81 mg ASA and 75 mg plavix daily using saline and gel at night.   4) onychomycosis : has been treating topically with tea tree oil  for several months with no change.     wants to see foot doctor for treatment   5) Cryptogennic CVA;  Loop recorder implanted in early October.  overnight sleep test done by the [facility] at their lab  May 2023  was  normal.     Outpatient Medications Prior to Visit  Medication Sig Dispense Refill   aspirin EC 81 MG EC tablet Take 1 tablet (81 mg total) by mouth daily. Swallow whole. 30 tablet 11   atorvastatin (LIPITOR) 80 MG tablet TAKE 1 TABLET DAILY 90 tablet 3   blood glucose meter kit and supplies USE TO CHECK BLOOD SUGARS  UP TO TWO TIMES DAILY 1 each 0   Blood Glucose Monitoring Suppl (FREESTYLE FREEDOM LITE) w/Device KIT 1 application by Does not apply route 2 (two) times daily. 1 kit 0   clopidogrel (PLAVIX) 75 MG tablet Take 1 tablet (75 mg total) by mouth daily. 90 tablet 2   glucose blood (FREESTYLE LITE) test strip USE TO CHECK SUGARS TWICE A DAY 200 each 1   hydrochlorothiazide (HYDRODIURIL) 25 MG tablet TAKE 1 TABLET DAILY 90 tablet 3   JANUVIA 100 MG tablet TAKE 1 TABLET DAILY 90 tablet 3   Krill Oil 300 MG CAPS Take 300 mg by mouth daily.     Lancets (FREESTYLE) lancets USE TO CHECK SUGAR TWICE A DAY 200 each 3   metFORMIN (GLUCOPHAGE) 1000 MG tablet TAKE 1 TABLET TWICE A DAY WITH MEALS 180 tablet 3   Multiple Vitamins-Minerals (CENTRUM SILVER ADULT 50+ PO) Take 1 tablet by mouth daily.     Telmisartan-amLODIPine 40-10 MG TABS Take 1 tablet by mouth at bedtime. 90 tablet 1   No facility-administered medications prior to visit.    Review of Systems;  Patient denies headache, fevers, malaise, unintentional weight loss, skin rash, eye pain, sinus congestion and sinus pain, sore throat, dysphagia,  hemoptysis , cough, dyspnea, wheezing, chest pain, palpitations, orthopnea, edema, abdominal pain, nausea, melena, diarrhea, constipation, flank pain, dysuria, hematuria, urinary  Frequency, nocturia,  numbness, tingling, seizures,  Focal weakness, Loss of consciousness,  Tremor, insomnia, depression, anxiety, and suicidal ideation.      Objective:  BP 132/78 (BP Location: Left Arm, Patient Position: Sitting, Cuff Size: Normal)   Pulse 95   Temp 97.6 F (36.4 C) (Oral)   Ht _0  (1.727 m)   Wt 188 lb (85.3 kg)   SpO2 97%   BMI 28.59 kg/m   BP Readings from Last 3 Encounters:  04/26/22 132/78  04/05/22 114/75  03/31/22 128/78    Wt Readings from Last 3 Encounters:  04/26/22 188 lb (85.3 kg)  04/05/22 187 lb (84.8 kg)  03/31/22 187 lb (84.8 kg)    General appearance: alert, cooperative and  appears stated age Ears: normal TM's and external ear canals both ears Throat: lips, mucosa, and tongue normal; teeth and gums normal Neck: no adenopathy, no carotid bruit, supple, symmetrical, trachea midline and thyroid not enlarged, symmetric, no tenderness/mass/nodules Back: symmetric, no curvature. ROM normal. No CVA tenderness. Lungs: clear to auscultation bilaterally Heart: regular rate and rhythm, S1, S2 normal, no murmur, click, rub or gallop Abdomen: soft, non-tender; bowel sounds normal; no masses,  no organomegaly Pulses: 2+ and symmetric Skin: Skin color, texture, turgor normal. No rashes or lesions Lymph nodes: Cervical, supraclavicular, and axillary nodes normal. Neuro:  awake and interactive with normal mood and affect. Higher cortical functions are normal. Speech is clear without word-finding difficulty or dysarthria. Extraocular movements are intact. Visual fields of both eyes are grossly intact. Sensation to light touch is grossly intact bilaterally of upper and lower extremities. Motor examination shows 4+/5 symmetric hand grip and upper extremity and 5/5 lower extremity strength. There is no pronation or drift. Gait is non-ataxic   Lab Results  Component Value Date   HGBA1C 6.9 (H) 04/16/2022   HGBA1C 6.9 (H) 01/13/2022   HGBA1C 6.9 (H) 10/09/2021    Lab Results  Component Value Date   CREATININE 0.85 04/16/2022   CREATININE 0.82 01/13/2022   CREATININE 0.76 12/07/2021    Lab Results  Component Value Date   WBC 5.7 04/16/2022   HGB 12.8 (L) 04/16/2022   HCT 39.9 04/16/2022   PLT 189.0 04/16/2022   GLUCOSE 121 (H) 04/16/2022   CHOL 131 04/16/2022   TRIG 71.0 04/16/2022   HDL 57.80 04/16/2022   LDLDIRECT 52.8 10/06/2021   LDLCALC 59 04/16/2022   ALT 39 04/16/2022   AST 29 04/16/2022   NA 138 04/16/2022   K 4.3 04/16/2022   CL 101 04/16/2022   CREATININE 0.85 04/16/2022   BUN 12 04/16/2022   CO2 25 04/16/2022   PSA 2.43 09/11/2021   INR 1.0 04/26/2022    HGBA1C 6.9 (H) 04/16/2022   MICROALBUR 1.5 01/13/2022    ECHO TEE  Result Date: 12/09/2021    TRANSESOPHOGEAL ECHO REPORT   Patient Name:   Robert Robertson. Date of Exam: 12/09/2021 Medical Rec #:  [id number]              Height:       68.0 in Accession #:    [id number]             Weight:       188.0 lb Date of Birth:  08-25-1950              BSA:          1.991 m Patient Age:    74 years  BP:           103/66 mmHg Patient Gender: M                      HR:           78 bpm. Exam Location:  [facility] Procedure: Transesophageal Echo, Color Doppler, Cardiac Doppler and Saline            Contrast Bubble Study Indications:     I63.9 CVA-unspecified mechanism.  History:         Patient has prior history of Echocardiogram examinations, most                  recent 10/06/2021. Risk Factors:Diabetes, Hypertension and                  Dyslipidemia.  Sonographer:     Sherrie Sport Referring Phys:  [id number] [name] Diagnosing Phys: [name] MD PROCEDURE: The transesophogeal probe was passed without difficulty through the esophogus of the patient. Sedation performed by performing physician. The patient developed no complications during the procedure. IMPRESSIONS  1. Left ventricular ejection fraction, by estimation, is 60 to 65%. The left ventricle has normal function.  2. Right ventricular systolic function is normal. The right ventricular size is normal.  3. No left atrial/left atrial appendage thrombus was detected.  4. The mitral valve is normal in structure. Mild mitral valve regurgitation.  5. The aortic valve is tricuspid. Aortic valve regurgitation is not visualized.  6. Evidence of atrial level shunting detected by color flow Doppler. Agitated saline contrast bubble study was positive with shunting observed within 3-6 cardiac cycles suggestive of interatrial shunt. atrial septum appears aneurysmal with evidence of PFO. FINDINGS  Left Ventricle: Left ventricular ejection fraction, by  estimation, is 60 to 65%. The left ventricle has normal function. The left ventricular internal cavity size was normal in size. Right Ventricle: The right ventricular size is normal. No increase in right ventricular wall thickness. Right ventricular systolic function is normal. Left Atrium: Left atrial size was normal in size. No left atrial/left atrial appendage thrombus was detected. Right Atrium: Right atrial size was normal in size. Pericardium: There is no evidence of pericardial effusion. Mitral Valve: The mitral valve is normal in structure. Mild mitral valve regurgitation. Tricuspid Valve: The tricuspid valve is normal in structure. Tricuspid valve regurgitation is not demonstrated. Aortic Valve: The aortic valve is tricuspid. Aortic valve regurgitation is not visualized. Pulmonic Valve: The pulmonic valve was normal in structure. Pulmonic valve regurgitation is not visualized. Aorta: The aortic root is normal in size and structure. IAS/Shunts: The interatrial septum is aneurysmal. Evidence of atrial level shunting detected by color flow Doppler. Agitated saline contrast was given intravenously to evaluate for intracardiac shunting. Agitated saline contrast bubble study was positive  with shunting observed within 3-6 cardiac cycles suggestive of interatrial shunt. Atrial septum appears aneurysmal with evidence of PFO. [name] MD Electronically signed by [name] MD Signature Date/Time: 12/09/2021/6:32:39 PM    Final     Assessment & Plan:   Problem List Items Addressed This Visit     Essential hypertension - Primary    Well controlled on current regimen of hctz  Amlodipine and telmisartan. Renal function stable, no changes today.      Relevant Orders   Comprehensive metabolic panel   DM type 2 with diabetic mixed hyperlipidemia ([place])    BS are at goal on metformin and  Januvia.  He is  tolerating ARB . Statin and  ASA( resumed due to acute CVA October 06 2021) .  Advised to  change monitoring to post prandial times .   Lab Results  Component Value Date   HGBA1C 6.9 (H) 04/16/2022   Lab Results  Component Value Date   LABMICR See below: 08/05/2021   LABMICR See below: 08/04/2020   MICROALBUR 1.5 01/13/2022   MICROALBUR 0.9 03/16/2021     Lab Results  Component Value Date   CHOL 131 04/16/2022   HDL 57.80 04/16/2022   LDLCALC 59 04/16/2022   LDLDIRECT 52.8 10/06/2021   TRIG 71.0 04/16/2022   CHOLHDL 2 04/16/2022         Relevant Orders   Lipid panel   LDL cholesterol, direct   Hemoglobin A1c   Epistaxis    encouarged to increase hydration of nasal passages to twice daily      Emphysema lung (HCC)    Centrilobular and paraseptal, mild by chest CT.  Secondary to tobacco abuse.   He remains asymptomatic with daily exercise.  Advised to obtain the RSV  Vaccine       Cerebrovascular accident (CVA) [facility])    Evidence of acute and chronic infarcts were noted on admission MRI brain from April 11-12.  He has a PFO.  He will take asa and plavix  indefinitely, will continue his high dose statin .  He has good BP control and his diabetes is controlled.  He has  been referred to neurology and cardiology and had  A LOOP monitor placed in early October.  He had a normal sleep study in May at the [facility]       Other Visit Diagnoses     Recurrent epistaxis       Relevant Orders   Protime-INR (Completed)   PTT (Completed)       I spent a total of 41  minutes with this patient in a face to face visit on the date of this encounter reviewing the last office visit with me,  his most recent visit with cardiology ,    patient's diet and exercise habits, home blood pressure /blood sugar readings,   and post visit ordering of testing and therapeutics.    Follow-up: Return in about 3 months (around 07/27/2022) for follow up diabetes.   [name], MD

## 2022-04-26 NOTE — Patient Instructions (Addendum)
Please check your blood sugars  2 hours after a meal (you can vary from day to day which meal you check)  Send me your blood sugars in 2 weeks through Mychart or fax/call the office. If your sugars are still too high we will need to change your medications or add additional medications depending on your blood sugars.     Moisturize the nostrils twice daily  to prevent nosebleeds.

## 2022-04-30 ENCOUNTER — Telehealth: Payer: Self-pay | Admitting: Cardiology

## 2022-04-30 NOTE — Telephone Encounter (Signed)
See Clearance phone note 04/19/2022. Dr. Lou Miner from [facility] states they received the clearance for the patient's dental cleaning. She says since the patient had a loop recorder placed 4 months ago they would like to know when they can schedule the cleaning. She states they are not sure if the patient would need to wait 6 months post loop placement. Phone: [phone]

## 2022-04-30 NOTE — Telephone Encounter (Signed)
Requesting office made aware that it is okay for pt to proceed with cleaning.

## 2022-05-03 ENCOUNTER — Ambulatory Visit (INDEPENDENT_AMBULATORY_CARE_PROVIDER_SITE_OTHER): Payer: Medicare PPO

## 2022-05-03 DIAGNOSIS — I639 Cerebral infarction, unspecified: Secondary | ICD-10-CM | POA: Diagnosis not present

## 2022-05-03 LAB — CUP PACEART REMOTE DEVICE CHECK
Date Time Interrogation Session: 20231106110751
Implantable Pulse Generator Implant Date: 20231004

## 2022-05-24 ENCOUNTER — Telehealth: Payer: Self-pay | Admitting: Internal Medicine

## 2022-05-24 DIAGNOSIS — G629 Polyneuropathy, unspecified: Secondary | ICD-10-CM | POA: Diagnosis not present

## 2022-05-24 DIAGNOSIS — I639 Cerebral infarction, unspecified: Secondary | ICD-10-CM | POA: Diagnosis not present

## 2022-05-24 DIAGNOSIS — Q2112 Patent foramen ovale: Secondary | ICD-10-CM | POA: Diagnosis not present

## 2022-05-24 NOTE — Telephone Encounter (Signed)
Pt dropped off prove of injection that he received at [facility]. Paperwork will be at Dr. Derrel Nip color folder.

## 2022-05-24 NOTE — Telephone Encounter (Signed)
Chart has been updated with vaccine information.

## 2022-06-01 NOTE — Progress Notes (Signed)
Carelink Summary Report / Loop Recorder 

## 2022-06-07 ENCOUNTER — Ambulatory Visit (INDEPENDENT_AMBULATORY_CARE_PROVIDER_SITE_OTHER): Payer: Medicare PPO

## 2022-06-07 DIAGNOSIS — I639 Cerebral infarction, unspecified: Secondary | ICD-10-CM

## 2022-06-07 LAB — CUP PACEART REMOTE DEVICE CHECK
Date Time Interrogation Session: 20231210231625
Implantable Pulse Generator Implant Date: 20231004

## 2022-06-09 NOTE — Telephone Encounter (Signed)
Error

## 2022-07-09 ENCOUNTER — Other Ambulatory Visit: Payer: Self-pay | Admitting: Internal Medicine

## 2022-07-11 ENCOUNTER — Other Ambulatory Visit: Payer: Self-pay | Admitting: Internal Medicine

## 2022-07-12 ENCOUNTER — Ambulatory Visit (INDEPENDENT_AMBULATORY_CARE_PROVIDER_SITE_OTHER): Payer: Medicare PPO

## 2022-07-12 DIAGNOSIS — I639 Cerebral infarction, unspecified: Secondary | ICD-10-CM | POA: Diagnosis not present

## 2022-07-12 NOTE — Progress Notes (Signed)
Carelink Summary Report / Loop Recorder

## 2022-07-13 LAB — CUP PACEART REMOTE DEVICE CHECK
Date Time Interrogation Session: 20240112231546
Implantable Pulse Generator Implant Date: 20231004

## 2022-07-17 ENCOUNTER — Other Ambulatory Visit: Payer: Self-pay | Admitting: Internal Medicine

## 2022-07-23 ENCOUNTER — Encounter: Payer: Self-pay | Admitting: Pharmacist

## 2022-07-26 ENCOUNTER — Other Ambulatory Visit (INDEPENDENT_AMBULATORY_CARE_PROVIDER_SITE_OTHER): Payer: Medicare PPO

## 2022-07-26 DIAGNOSIS — E1169 Type 2 diabetes mellitus with other specified complication: Secondary | ICD-10-CM

## 2022-07-26 DIAGNOSIS — I10 Essential (primary) hypertension: Secondary | ICD-10-CM

## 2022-07-26 DIAGNOSIS — E782 Mixed hyperlipidemia: Secondary | ICD-10-CM | POA: Diagnosis not present

## 2022-07-26 LAB — LIPID PANEL
Cholesterol: 136 mg/dL (ref 0–200)
HDL: 53.2 mg/dL (ref >39.00)
LDL Cholesterol: 67 mg/dL (ref 0–99)
NonHDL: 82.36
Total CHOL/HDL Ratio: 3
Triglycerides: 76 mg/dL (ref 0.0–149.0)
VLDL: 15.2 mg/dL (ref 0.0–40.0)

## 2022-07-26 LAB — COMPREHENSIVE METABOLIC PANEL
ALT: 43 U/L (ref 0–53)
AST: 29 U/L (ref 0–37)
Albumin: 4.3 g/dL (ref 3.5–5.2)
Alkaline Phosphatase: 57 U/L (ref 39–117)
BUN: 12 mg/dL (ref 6–23)
CO2: 29 mEq/L (ref 19–32)
Calcium: 9.1 mg/dL (ref 8.4–10.5)
Chloride: 99 mEq/L (ref 96–112)
Creatinine, Ser: 0.86 mg/dL (ref 0.40–1.50)
GFR: 87 mL/min (ref >60.00)
Glucose, Bld: 130 mg/dL — ABNORMAL HIGH (ref 70–99)
Potassium: 4 mEq/L (ref 3.5–5.1)
Sodium: 138 mEq/L (ref 135–145)
Total Bilirubin: 0.9 mg/dL (ref 0.2–1.2)
Total Protein: 6.7 g/dL (ref 6.0–8.3)

## 2022-07-26 LAB — LDL CHOLESTEROL, DIRECT: Direct LDL: 71 mg/dL

## 2022-07-26 LAB — HEMOGLOBIN A1C: Hgb A1c MFr Bld: 7 % — ABNORMAL HIGH (ref 4.6–6.5)

## 2022-07-27 ENCOUNTER — Encounter: Payer: Self-pay | Admitting: *Deleted

## 2022-07-27 ENCOUNTER — Other Ambulatory Visit: Payer: Self-pay

## 2022-07-27 ENCOUNTER — Emergency Department: Payer: Medicare PPO

## 2022-07-27 ENCOUNTER — Emergency Department
Admission: EM | Admit: 2022-07-27 | Discharge: 2022-07-27 | Disposition: A | Discharge status: Home / Self Care | Payer: Medicare PPO | Attending: Emergency Medicine | Admitting: Emergency Medicine

## 2022-07-27 DIAGNOSIS — E119 Type 2 diabetes mellitus without complications: Secondary | ICD-10-CM | POA: Diagnosis not present

## 2022-07-27 DIAGNOSIS — I10 Essential (primary) hypertension: Secondary | ICD-10-CM | POA: Diagnosis not present

## 2022-07-27 DIAGNOSIS — Z8673 Personal history of transient ischemic attack (TIA), and cerebral infarction without residual deficits: Secondary | ICD-10-CM | POA: Insufficient documentation

## 2022-07-27 DIAGNOSIS — Z87891 Personal history of nicotine dependence: Secondary | ICD-10-CM | POA: Diagnosis not present

## 2022-07-27 DIAGNOSIS — R079 Chest pain, unspecified: Secondary | ICD-10-CM | POA: Diagnosis not present

## 2022-07-27 DIAGNOSIS — R0789 Other chest pain: Secondary | ICD-10-CM | POA: Diagnosis not present

## 2022-07-27 LAB — BASIC METABOLIC PANEL
Anion gap: 10 (ref 5–15)
BUN: 14 mg/dL (ref 8–23)
CO2: 26 mmol/L (ref 22–32)
Calcium: 9.3 mg/dL (ref 8.9–10.3)
Chloride: 102 mmol/L (ref 98–111)
Creatinine, Ser: 0.84 mg/dL (ref 0.61–1.24)
GFR, Estimated: >60 mL/min (ref >60)
Glucose, Bld: 109 mg/dL — ABNORMAL HIGH (ref 70–99)
Potassium: 3.9 mmol/L (ref 3.5–5.1)
Sodium: 138 mmol/L (ref 135–145)

## 2022-07-27 LAB — CBC
HCT: 44 % (ref 39.0–52.0)
Hemoglobin: 13.7 g/dL (ref 13.0–17.0)
MCH: 27.5 pg (ref 26.0–34.0)
MCHC: 31.1 g/dL (ref 30.0–36.0)
MCV: 88.4 fL (ref 80.0–100.0)
Platelets: 194 10*3/uL (ref 150–400)
RBC: 4.98 MIL/uL (ref 4.22–5.81)
RDW: 13.6 % (ref 11.5–15.5)
WBC: 5.8 10*3/uL (ref 4.0–10.5)
nRBC: 0 % (ref 0.0–0.2)

## 2022-07-27 LAB — TROPONIN I (HIGH SENSITIVITY)
Troponin I (High Sensitivity): 5 ng/L (ref <18)
Troponin I (High Sensitivity): 5 ng/L (ref <18)

## 2022-07-27 NOTE — ED Provider Notes (Signed)
[facility] Provider Note    Event Date/Time   First MD Initiated Contact with Patient 07/27/22 1827     (approximate)   History   Chest Pain   HPI  Robert Robertson. is a 72 y.o. male history of diabetes hypertension hyperlipidemia, CVA who presents after an episode of chest pain.  Patient was driving about an hour ago when he developed a twinge in his chest.  Pain came on just lasted for seconds at a time and seem to occur with taking deep breath.  He has felt it about 5 total time since onset.  He is pain-free currently.  Denies shortness of breath denies nausea or diaphoresis.  Pain does not radiate.  Pain was not brought on by walking.  He has no history of coronary artery disease.  Did have a stroke several months ago and was found to have a PFO and has a loop recorder in place but he is not anticoagulated.  Takes Plavix.  Denies lower extremity swelling no cough fevers chills.     Past Medical History:  Diagnosis Date   Allergy    Colon polyp    Diabetes mellitus    Hyperlipidemia    Hypertension     Patient Active Problem List   Diagnosis Date Noted   Cerebrovascular accident (CVA) ([place])    History of CVA (cerebrovascular accident) without residual deficits 10/08/2021   Hospital discharge follow-up 10/08/2021   Right sided numbness 10/06/2021   HLD (hyperlipidemia) 10/06/2021   Alcohol use 10/06/2021   Emphysema lung ([place]) 09/16/2021   Pulmonary nodule, right 09/07/2020   Meralgia paraesthetica, right 03/08/2020   PSA elevation 07/06/2019   Prostate cancer screening 01/28/2019   Educated about COVID-19 virus infection 10/13/2018   Diabetic nephropathy associated with type 2 diabetes mellitus ([place]) 07/10/2017   Spouse of alcoholic 70/17/7939   Epistaxis 11/14/2015   Hepatic steatosis 01/28/2015   Obesity (BMI 30-39.9) 08/09/2014   Diverticulosis of colon without hemorrhage 08/07/2014   Encounter for screening colonoscopy 06/03/2014    Essential hypertension 05/11/2014   DM type 2 with diabetic mixed hyperlipidemia ([place]) 05/11/2014   Tubular adenoma of colon 05/11/2014   History of tobacco abuse 05/11/2014     Physical Exam  Triage Vital Signs: ED Triage Vitals  Enc Vitals Group     BP 07/27/22 1737 122/78     Pulse Rate 07/27/22 1737 83     Resp 07/27/22 1737 18     Temp 07/27/22 1737 98.7 F (37.1 C)     Temp Source 07/27/22 1737 Oral     SpO2 07/27/22 1737 97 %     Weight 07/27/22 1735 186 lb (84.4 kg)     Height 07/27/22 1735 '5\' 8"'$  (1.727 m)     Head Circumference --      Peak Flow --      Pain Score 07/27/22 1734 4     Pain Loc --      Pain Edu? --      Excl. in [place]? --     Most recent vital signs: Vitals:   07/27/22 1830 07/27/22 2000  BP: 134/87 126/80  Pulse: 75 73  Resp: 17 18  Temp:    SpO2: 98% 96%     General: Awake, no distress.  CV:  Good peripheral perfusion.  No peripheral edema Resp:  Normal effort.  Lungs are clear no increased work of breathing Abd:  No distention.  Neuro:  Awake, Alert, Oriented x 3  Other:     ED Results / Procedures / Treatments  Labs (all labs ordered are listed, but only abnormal results are displayed) Labs Reviewed  BASIC METABOLIC PANEL - Abnormal; Notable for the following components:      Result Value   Glucose, Bld 109 (*)    All other components within normal limits  CBC  TROPONIN I (HIGH SENSITIVITY)  TROPONIN I (HIGH SENSITIVITY)     EKG  EKG reviewed interpreted myself shows sinus rhythm normal axis normal intervals inferior Q waves no acute ischemic changes   RADIOLOGY I reviewed and interpreted the CXR which does not show any acute cardiopulmonary process    PROCEDURES:  Critical Care performed: No  Procedures  The patient is on the cardiac monitor to evaluate for evidence of arrhythmia and/or significant heart rate changes.   MEDICATIONS ORDERED IN ED: Medications - No data to display   IMPRESSION /  MDM / [place] / ED COURSE  I reviewed the triage vital signs and the nursing notes.                              Patient's presentation is most consistent with acute presentation with potential threat to life or bodily function.  Differential diagnosis includes, but is not limited to, ACS, PE, pleurisy, musculoskeletal pain, GI related pain less likely aortic dissection   The patient is 72 year old male with multiple cardiac risk factors including diabetes hypertension recent TIA who presents because of chest pain.  His pain is quite atypical occurred while he was driving he describes it as a twinge that came on lasted for just seconds at a time and seem to occur while he took a deep breath but he denies associated nausea diaphoresis or shortness of breath.  He is felt about 5 times in total and again pain does not persist just comes on for seconds at a time.  Not exertional.  He is pain-free currently.  Vitals are reassuring EKG is nonischemic and similar to prior.  First troponin is negative but his pain started just an hour ago.  Despite his risk factors his pain is quite atypical and I think if his second troponin is negative that he can be discharged with primary care follow-up.  Consider diagnosis of PE given somewhat pleuritic nature of the pain but given he has no ongoing pain now is not tachycardic not hypoxic feel this is less likely.  Symptomatology is not consistent with dissection.  Patient's repeat troponin is negative and he continues to feel well.  Will discharge.  Encouraged primary care follow-up and return to ED for any new or worsening chest pain.      FINAL CLINPulmICAL IMPRESSION(S) / ED DIAGNOSES   Final diagnoses:  Chest pain, unspecified type     Rx / DC Orders   ED Discharge Orders     None        Note:  This document was prepared using Dragon voice recognition software and may include unintentional dictation errors.   Rada Hay,  MD 07/27/22 [phone]

## 2022-07-27 NOTE — ED Notes (Signed)
Pt presents to ED with c/o of midsternum CP that does not radiate. Pt states more pain when he is breathing but denies pain on inspiration. Pt states about 6 episodes of this while driving and one while here. Pt denied cardiac HX. NAD noted. Pt denies any other issues at this time.

## 2022-07-27 NOTE — ED Triage Notes (Signed)
Pt reports chest pain today while driving.  Pt denies sob.  No n/v/d   non radiating pain   no cough. No fever   pt alert  speech clear.

## 2022-07-27 NOTE — Discharge Instructions (Addendum)
Your EKG and cardiac enzymes are reassuring.  If your pain is worsening or changing in someway please return to the emergency department.  Otherwise please follow-up with your primary care doctor and continue to take your aspirin and Plavix.

## 2022-07-28 ENCOUNTER — Ambulatory Visit (INDEPENDENT_AMBULATORY_CARE_PROVIDER_SITE_OTHER): Payer: Medicare PPO | Admitting: Internal Medicine

## 2022-07-28 ENCOUNTER — Encounter: Payer: Self-pay | Admitting: Internal Medicine

## 2022-07-28 VITALS — BP 122/64 | HR 92 | Temp 98.2°F | Ht 68.0 in | Wt 189.4 lb

## 2022-07-28 DIAGNOSIS — E1169 Type 2 diabetes mellitus with other specified complication: Secondary | ICD-10-CM

## 2022-07-28 DIAGNOSIS — I700 Atherosclerosis of aorta: Secondary | ICD-10-CM

## 2022-07-28 DIAGNOSIS — Z8673 Personal history of transient ischemic attack (TIA), and cerebral infarction without residual deficits: Secondary | ICD-10-CM | POA: Diagnosis not present

## 2022-07-28 DIAGNOSIS — R911 Solitary pulmonary nodule: Secondary | ICD-10-CM | POA: Diagnosis not present

## 2022-07-28 DIAGNOSIS — I10 Essential (primary) hypertension: Secondary | ICD-10-CM | POA: Diagnosis not present

## 2022-07-28 DIAGNOSIS — E7800 Pure hypercholesterolemia, unspecified: Secondary | ICD-10-CM

## 2022-07-28 DIAGNOSIS — R972 Elevated prostate specific antigen [PSA]: Secondary | ICD-10-CM | POA: Diagnosis not present

## 2022-07-28 DIAGNOSIS — J439 Emphysema, unspecified: Secondary | ICD-10-CM

## 2022-07-28 DIAGNOSIS — E782 Mixed hyperlipidemia: Secondary | ICD-10-CM

## 2022-07-28 DIAGNOSIS — Z125 Encounter for screening for malignant neoplasm of prostate: Secondary | ICD-10-CM | POA: Diagnosis not present

## 2022-07-28 MED ORDER — SITAGLIPTIN PHOSPHATE 100 MG PO TABS: 100.0000 mg | ORAL_TABLET | Freq: Every day | ORAL | 3 refills | Status: DC

## 2022-07-28 MED ORDER — CICLOPIROX TREATMENT 8 % EX KIT: PACK | CUTANEOUS | 1 refills | Status: DC

## 2022-07-28 NOTE — Assessment & Plan Note (Signed)
Evidence of acute and chronic infarcts were noted on admission MRI brain from April 11-12. 2023 [facility] noted  a PFO.  He will take asa and plavix for 3 months, will continue his high dose statin .  He has good BP contol and his diabetes is controlled.  He his now followed by  neurology and cardiology

## 2022-07-28 NOTE — Assessment & Plan Note (Signed)
BS are at goal on metformin and  Januvia.  He is tolerating ARB . Statin and  ASA( resumed due to acute CVA October 06 2021) .  Advised to change monitoring to post prandial times .   Lab Results  Component Value Date   HGBA1C 7.0 (H) 07/26/2022   Lab Results  Component Value Date   LABMICR See below: 08/05/2021   LABMICR See below: 08/04/2020   MICROALBUR 1.5 01/13/2022   MICROALBUR 0.9 03/16/2021     Lab Results  Component Value Date   CHOL 136 07/26/2022   HDL 53.20 07/26/2022   LDLCALC 67 07/26/2022   LDLDIRECT 71.0 07/26/2022   TRIG 76.0 07/26/2022   CHOLHDL 3 07/26/2022

## 2022-07-28 NOTE — Patient Instructions (Signed)
  I HAVE MADE A REFERRAL TO THE PULMONARY CLINIC TO CONTINUE ANNUAL SCREENING FOR LUNG CANCER WITH THE CHEST CT  THEY MAY RECOMMEND PULMONARY FUNCTION TESTS TO EVALUATE THE FINDING OF EMPHYSEMA ON YOUR LAST CT

## 2022-07-28 NOTE — Progress Notes (Unsigned)
Subjective:  Patient ID: Robert Lat., male    DOB: Dec 26, 1950  Age: 72 y.o. MRN: [id number]  CC: The primary encounter diagnosis was Essential hypertension. Diagnoses of DM type 2 with diabetic mixed hyperlipidemia ([place]), Pure hypercholesterolemia, History of CVA (cerebrovascular accident) without residual deficits, Pulmonary nodule, right, PSA elevation, Prostate cancer screening, Aortic atherosclerosis ([place]), and Pulmonary emphysema, unspecified emphysema type ([place]) were also pertinent to this visit.   HPI Robert Lat. presents for  Chief Complaint  Patient presents with   Medical Management of Chronic Issues    3 month follow up on diabetes.     1)  H/o CVA: he is s/p LOOP Monitor placed October .  Tolerating plavix/asa  without bleeding and atorvastatin without muscle pain   .  No plans for PFO closure    2) ER visit yesterday for mild chest pain  described as a recurrent "twinge" .  No changes on EKG ,  troponins negative x 2 . Symptoms were atypical  and mild and attributed to muscle sprain due to care of disabled wife.   3)  DM type 2  : tolerating januvia and metformin.  CBGs reviewed.     4) HTN  checking at home and readings are < 140/80  o.  Tolerating telmisartan/amlodipine and hctz   Outpatient Medications Prior to Visit  Medication Sig Dispense Refill   aspirin EC 81 MG EC tablet Take 1 tablet (81 mg total) by mouth daily. Swallow whole. 30 tablet 11   atorvastatin (LIPITOR) 80 MG tablet TAKE 1 TABLET DAILY 90 tablet 3   blood glucose meter kit and supplies USE TO CHECK BLOOD SUGARS UP TO TWO TIMES DAILY 1 each 0   Blood Glucose Monitoring Suppl (FREESTYLE FREEDOM LITE) w/Device KIT 1 application by Does not apply route 2 (two) times daily. 1 kit 0   clopidogrel (PLAVIX) 75 MG tablet Take 1 tablet (75 mg total) by mouth daily. 90 tablet 2   glucose blood (FREESTYLE LITE) test strip USE TO CHECK SUGARS TWICE A DAY 200 each 1   hydrochlorothiazide  (HYDRODIURIL) 25 MG tablet TAKE 1 TABLET DAILY 90 tablet 3   Krill Oil 300 MG CAPS Take 300 mg by mouth daily.     Lancets (FREESTYLE) lancets USE TO CHECK SUGAR TWICE A DAY 200 each 3   metFORMIN (GLUCOPHAGE) 1000 MG tablet TAKE 1 TABLET TWICE A DAY WITH MEALS 180 tablet 3   Multiple Vitamins-Minerals (CENTRUM SILVER ADULT 50+ PO) Take 1 tablet by mouth daily.     Telmisartan-amLODIPine 40-10 MG TABS TAKE 1 TABLET AT BEDTIME 90 tablet 3   JANUVIA 100 MG tablet TAKE 1 TABLET DAILY 90 tablet 3   No facility-administered medications prior to visit.    Review of Systems;  Patient denies headache, fevers, malaise, unintentional weight loss, skin rash, eye pain, sinus congestion and sinus pain, sore throat, dysphagia,  hemoptysis , cough, dyspnea, wheezing, chest pain, palpitations, orthopnea, edema, abdominal pain, nausea, melena, diarrhea, constipation, flank pain, dysuria, hematuria, urinary  Frequency, nocturia, numbness, tingling, seizures,  Focal weakness, Loss of consciousness,  Tremor, insomnia, depression, anxiety, and suicidal ideation.      Objective:  BP 122/64   Pulse 92   Temp 98.2 F (36.8 C) (Oral)   Ht '5\' 8"'$  (1.727 m)   Wt 189 lb 6.4 oz (85.9 kg)   SpO2 96%   BMI 28.80 kg/m   BP Readings from Last 3 Encounters:  07/28/22 122/64  07/27/22 (!) 128/91  04/26/22 132/78    Wt Readings from Last 3 Encounters:  07/28/22 189 lb 6.4 oz (85.9 kg)  07/27/22 186 lb (84.4 kg)  04/26/22 188 lb (85.3 kg)    Physical Exam  Lab Results  Component Value Date   HGBA1C 7.0 (H) 07/26/2022   HGBA1C 6.9 (H) 04/16/2022   HGBA1C 6.9 (H) 01/13/2022    Lab Results  Component Value Date   CREATININE 0.84 07/27/2022   CREATININE 0.86 07/26/2022   CREATININE 0.85 04/16/2022    Lab Results  Component Value Date   WBC 5.8 07/27/2022   HGB 13.7 07/27/2022   HCT 44.0 07/27/2022   PLT 194 07/27/2022   GLUCOSE 109 (H) 07/27/2022   CHOL 136 07/26/2022   TRIG 76.0 07/26/2022    HDL 53.20 07/26/2022   LDLDIRECT 71.0 07/26/2022   LDLCALC 67 07/26/2022   ALT 43 07/26/2022   AST 29 07/26/2022   NA 138 07/27/2022   K 3.9 07/27/2022   CL 102 07/27/2022   CREATININE 0.84 07/27/2022   BUN 14 07/27/2022   CO2 26 07/27/2022   PSA 2.43 09/11/2021   INR 1.0 04/26/2022   HGBA1C 7.0 (H) 07/26/2022   MICROALBUR 1.5 01/13/2022    DG Chest 2 View  Result Date: 07/27/2022 CLINICAL DATA:  Chest pain. EXAM: CHEST - 2 VIEW COMPARISON:  Chest CT 09/04/2021 FINDINGS: The cardiomediastinal contours are normal. The lungs are clear. Pulmonary vasculature is normal. No consolidation, pleural effusion, or pneumothorax. Mild broad-based scoliosis. No acute osseous abnormalities are seen. Loop recorder in the left chest wall. IMPRESSION: No acute chest findings. Electronically Signed   By: Keith Rake M.D.   On: 07/27/2022 18:13    Assessment & Plan:  .Essential hypertension Assessment & Plan: Well controlled on current regimen of hctz  Amlodipine and telmisartan. Renal function stable, no changes today.  Orders: -     Comprehensive metabolic panel; Future -     Microalbumin / creatinine urine ratio; Future  DM type 2 with diabetic mixed hyperlipidemia ([place]) Assessment & Plan: BS are at goal on metformin and  Januvia.  He is tolerating ARB . Statin and  ASA( resumed due to acute CVA October 06 2021) .  Advised to change monitoring to post prandial times .   Lab Results  Component Value Date   HGBA1C 7.0 (H) 07/26/2022   Lab Results  Component Value Date   LABMICR See below: 08/05/2021   LABMICR See below: 08/04/2020   MICROALBUR 1.5 01/13/2022   MICROALBUR 0.9 03/16/2021     Lab Results  Component Value Date   CHOL 136 07/26/2022   HDL 53.20 07/26/2022   LDLCALC 67 07/26/2022   LDLDIRECT 71.0 07/26/2022   TRIG 76.0 07/26/2022   CHOLHDL 3 07/26/2022     Orders: -     Comprehensive metabolic panel; Future -     Hemoglobin A1c; Future -     Microalbumin /  creatinine urine ratio; Future  Pure hypercholesterolemia Assessment & Plan: He is at goal of LDL < 70  Lab Results  Component Value Date   CHOL 136 07/26/2022   HDL 53.20 07/26/2022   LDLCALC 67 07/26/2022   LDLDIRECT 71.0 07/26/2022   TRIG 76.0 07/26/2022   CHOLHDL 3 07/26/2022     Orders: -     Lipid panel; Future -     LDL cholesterol, direct; Future  History of CVA (cerebrovascular accident) without residual deficits Assessment & Plan: Considered Cryptogenic by cardiology .  2023 ECHO noted  a PFO which is considered non contributor. .  He has been taking  asa and plavix for 3 months, will continue his high dose statin .  He has good BP control and his diabetes is controlled.  He is now followed by  neurology and cardiology    Pulmonary nodule, right -     Ambulatory referral to Pulmonology  PSA elevation Assessment & Plan: Attributed to NPH with LUTS by Urology in Jan 2021.  Continue annual follow up with  [facility]    Prostate cancer screening Assessment & Plan: PSA checked annually by Urology [facility]) next appt Feb 2024   Aortic atherosclerosis [facility]) Assessment & Plan: Reviewed findings of prior CT scan today..  Patient is tolerating high potency statin therapy     Pulmonary emphysema, unspecified emphysema type ([place]) Assessment & Plan: Centrilobular and paraseptal, mild by chest CT.  Secondary to tobacco abuse.   He remains asymptomatic with daily exercise.     Other orders -     Ciclopirox Treatment; Apply twice daily to nail bed and cuticle  Dispense: 34.6 mL; Refill: 1 -     SITagliptin Phosphate; Take 1 tablet (100 mg total) by mouth daily.  Dispense: 90 tablet; Refill: 3     I provided 40 minutes of face-to-face time during this encounter reviewing patient' recent surgical and non surgical procedures, previous  labs and imaging studies, counseling on currently addressed issues,  and post visit ordering to diagnostics and therapeutics .    Follow-up: Return in about 6 months (around 01/26/2023).   [name], MD

## 2022-07-29 DIAGNOSIS — I700 Atherosclerosis of aorta: Secondary | ICD-10-CM | POA: Insufficient documentation

## 2022-07-29 NOTE — Assessment & Plan Note (Signed)
He is at goal of LDL < 70  Lab Results  Component Value Date   CHOL 136 07/26/2022   HDL 53.20 07/26/2022   LDLCALC 67 07/26/2022   LDLDIRECT 71.0 07/26/2022   TRIG 76.0 07/26/2022   CHOLHDL 3 07/26/2022

## 2022-07-29 NOTE — Assessment & Plan Note (Signed)
Centrilobular and paraseptal, mild by chest CT.  Secondary to tobacco abuse.   He remains asymptomatic with daily exercise.

## 2022-07-29 NOTE — Assessment & Plan Note (Signed)
Reviewed findings of prior CT scan today..  Patient is tolerating high potency statin therapy  

## 2022-07-29 NOTE — Assessment & Plan Note (Signed)
PSA checked annually by Urology [facility]) next appt Feb 2024

## 2022-07-29 NOTE — Assessment & Plan Note (Signed)
Well controlled on current regimen of hctz  Amlodipine and telmisartan. Renal function stable, no changes today.

## 2022-07-29 NOTE — Assessment & Plan Note (Addendum)
Attributed to NPH with LUTS by Urology in Jan 2021.  Continue annual follow up with  [facility]

## 2022-08-03 ENCOUNTER — Telehealth: Payer: Self-pay

## 2022-08-03 NOTE — Telephone Encounter (Signed)
     Patient  visit on 07/27/2022  at [facility] was for chest pain.  Have you been able to follow up with your primary care physician? Yes  The patient was or was not able to obtain any needed medicine or equipment. No medication prescribed.  Are there diet recommendations that you are having difficulty following? No  Patient expresses understanding of discharge instructions and education provided has no other needs at this time. Yes   [place] Resource Care Guide   [email or web address]  ?? [phone]   Website: [email or web address]  [email or web address]

## 2022-08-05 ENCOUNTER — Ambulatory Visit: Payer: Medicare Other | Admitting: Urology

## 2022-08-05 ENCOUNTER — Other Ambulatory Visit: Payer: Self-pay

## 2022-08-05 ENCOUNTER — Telehealth: Payer: Self-pay | Admitting: Internal Medicine

## 2022-08-05 MED ORDER — CICLOPIROX TREATMENT 8 % EX KIT: PACK | CUTANEOUS | 1 refills | Status: DC

## 2022-08-05 NOTE — Telephone Encounter (Signed)
S/w [facility]- new rx needed to confirm dosing correct. New rx sent

## 2022-08-05 NOTE — Telephone Encounter (Signed)
Prescription Request  08/05/2022  Is this a "Controlled Substance" medicine? No  LOV: 07/28/2022  What is the name of the medication or equipment? Ciclopirox (CICLOPIROX TREATMENT) 8 % KIT [facility] prescription, dosage was  2x the dosage. If dosage is correct call [facility] at [phone]   Have you contacted your pharmacy to request a refill? Yes   Which pharmacy would you like this sent to?  [facility] - [address], [place] [address] Phone: [phone] Fax: [phone]   [facility] [place], [place] [address] [place] [address] Phone: [phone] Fax: [phone]    Patient notified that their request is being sent to the clinical staff for review and that they should receive a response within 2 business days.   Please advise at [place]

## 2022-08-11 ENCOUNTER — Encounter: Payer: Self-pay | Admitting: Urology

## 2022-08-11 ENCOUNTER — Ambulatory Visit (INDEPENDENT_AMBULATORY_CARE_PROVIDER_SITE_OTHER): Payer: Medicare PPO | Admitting: Urology

## 2022-08-11 VITALS — BP 133/84 | HR 98 | Ht 68.0 in | Wt 187.0 lb

## 2022-08-11 DIAGNOSIS — Z125 Encounter for screening for malignant neoplasm of prostate: Secondary | ICD-10-CM

## 2022-08-11 DIAGNOSIS — N400 Enlarged prostate without lower urinary tract symptoms: Secondary | ICD-10-CM | POA: Diagnosis not present

## 2022-08-11 LAB — BLADDER SCAN AMB NON-IMAGING: Scan Result: 0

## 2022-08-11 NOTE — Progress Notes (Unsigned)
08/11/2022 10:43 AM   Jenny Reichmann Barkley Bruns. 07-16-1950 [id number]  Referring provider: [name], MD [address],  [place] [address]  Chief Complaint  Patient presents with   Benign Prostatic Hypertrophy    HPI: 72 y.o. male presents for annual follow-up.  Doing well since last visit.  Has noted slight decrease in force of urinary stream and occasional urgency but not bothersome enough that he desires medical management Denies dysuria, gross hematuria Denies flank, abdominal or pelvic pain   PMH: Past Medical History:  Diagnosis Date   Allergy    Colon polyp    Diabetes mellitus    Hyperlipidemia    Hypertension     Surgical History: Past Surgical History:  Procedure Laterality Date   COLONOSCOPY  2010   10 mm adenomatous polyp in the descending colon without atypia, [name], MD   COLONOSCOPY WITH PROPOFOL N/A 07/18/2019   Procedure: COLONOSCOPY WITH PROPOFOL;  Surgeon: Robert Bellow, MD;  Location: [facility] ENDOSCOPY;  Service: Endoscopy;  Laterality: N/A;   TEE WITHOUT CARDIOVERSION N/A 12/09/2021   Procedure: TRANSESOPHAGEAL ECHOCARDIOGRAM (TEE);  Surgeon: [name], MD;  Location: [facility] ORS;  Service: Cardiovascular;  Laterality: N/A;    Home Medications:  Allergies as of 08/11/2022       Reactions   Pollen Extract Other (See Comments)   Hay-fever. Reaction: Sneezing        Medication List        Accurate as of August 11, 2022 10:43 AM. If you have any questions, ask your nurse or doctor.          aspirin EC 81 MG tablet Take 1 tablet (81 mg total) by mouth daily. Swallow whole.   atorvastatin 80 MG tablet Commonly known as: LIPITOR TAKE 1 TABLET DAILY   blood glucose meter kit and supplies USE TO CHECK BLOOD SUGARS UP TO TWO TIMES DAILY   CENTRUM SILVER ADULT 50+ PO Take 1 tablet by mouth daily.   Ciclopirox Treatment 8 % Kit Generic drug: Ciclopirox Apply twice daily to nail bed and  cuticle   clopidogrel 75 MG tablet Commonly known as: PLAVIX Take 1 tablet (75 mg total) by mouth daily.   FreeStyle Freedom Lite w/Device Kit 1 application by Does not apply route 2 (two) times daily.   freestyle lancets USE TO CHECK SUGAR TWICE A DAY   FREESTYLE LITE test strip Generic drug: glucose blood USE TO CHECK SUGARS TWICE A DAY   hydrochlorothiazide 25 MG tablet Commonly known as: HYDRODIURIL TAKE 1 TABLET DAILY   Krill Oil 300 MG Caps Take 300 mg by mouth daily.   metFORMIN 1000 MG tablet Commonly known as: GLUCOPHAGE TAKE 1 TABLET TWICE A DAY WITH MEALS   sitaGLIPtin 100 MG tablet Commonly known as: Januvia Take 1 tablet (100 mg total) by mouth daily.   Telmisartan-amLODIPine 40-10 MG Tabs TAKE 1 TABLET AT BEDTIME        Allergies:  Allergies  Allergen Reactions   Pollen Extract Other (See Comments)    Hay-fever. Reaction: Sneezing    Family History: Family History  Problem Relation Age of Onset   Diabetes Mother    Stroke Mother    Colon cancer Mother    Cancer Sister        brain tumor   Diabetes Brother    Diabetes Maternal Grandmother    Diabetes Maternal Grandfather     Social History:  reports that he quit smoking about 9 years  ago. His smoking use included cigarettes. He has a 22.50 pack-year smoking history. He has never used smokeless tobacco. He reports current alcohol use of about 2.0 standard drinks of alcohol per week. He reports that he does not use drugs.   Physical Exam: BP 133/84   Pulse 98   Ht 5' 8"$  (1.727 m)   Wt 187 lb (84.8 kg)   BMI 28.43 kg/m   Constitutional:  Alert and oriented, No acute distress. HEENT: [place] AT Respiratory: Normal respiratory effort, no increased work of breathing. Psychiatric: Normal mood and affect.   Assessment & Plan:    1. BPH without obstruction/lower urinary tract symptoms No bothersome LUTS Bladder scan PVR 0 mL  2.  Prostate cancer screening He has requested to continue PSA  screening and PSA ordered today. He desires to continue annual follow-up   Abbie Sons, MD  [place] [address], [place] [address], [place] [address] [phone]

## 2022-08-12 ENCOUNTER — Encounter: Payer: Self-pay | Admitting: *Deleted

## 2022-08-12 LAB — URINALYSIS, COMPLETE
Bilirubin, UA: NEGATIVE
Ketones, UA: NEGATIVE
Leukocytes,UA: NEGATIVE
Nitrite, UA: NEGATIVE
Protein,UA: NEGATIVE
RBC, UA: NEGATIVE
Specific Gravity, UA: 1.02 (ref 1.005–1.030)
Urobilinogen, Ur: 4 mg/dL — ABNORMAL HIGH (ref 0.2–1.0)
pH, UA: 6 (ref 5.0–7.5)

## 2022-08-12 LAB — MICROSCOPIC EXAMINATION

## 2022-08-12 LAB — PSA: Prostate Specific Ag, Serum: 3.4 ng/mL (ref 0.0–4.0)

## 2022-08-13 LAB — CUP PACEART REMOTE DEVICE CHECK
Date Time Interrogation Session: 20240214231805
Implantable Pulse Generator Implant Date: 20231004

## 2022-08-16 ENCOUNTER — Ambulatory Visit: Payer: Medicare PPO

## 2022-08-16 DIAGNOSIS — I639 Cerebral infarction, unspecified: Secondary | ICD-10-CM | POA: Diagnosis not present

## 2022-08-19 ENCOUNTER — Institutional Professional Consult (permissible substitution): Payer: Medicare PPO | Admitting: Pulmonary Disease

## 2022-08-19 NOTE — Progress Notes (Deleted)
Synopsis: Referred in February 2024 for pulmonary nodules by [name], MD  Subjective:   PATIENT ID: Robert Robertson. GENDER: male DOB: 1950-10-20, MRN: [id number]  No chief complaint on file.   This is a 72 year old gentleman, past medical history of diabetes hypertension hyperlipidemia.  Patient is a former smoker quit in 2015.Patient was referred for lung nodule evaluation.  Patient had a small right lower lobe pulmonary nodule on lung cancer screening CT in March 2023.  At the time measured 5 mm.    ***  Past Medical History:  Diagnosis Date   Allergy    Colon polyp    Diabetes mellitus    Hyperlipidemia    Hypertension      Family History  Problem Relation Age of Onset   Diabetes Mother    Stroke Mother    Colon cancer Mother    Cancer Sister        brain tumor   Diabetes Brother    Diabetes Maternal Grandmother    Diabetes Maternal Grandfather      Past Surgical History:  Procedure Laterality Date   COLONOSCOPY  2010   10 mm adenomatous polyp in the descending colon without atypia, [name], MD   COLONOSCOPY WITH PROPOFOL N/A 07/18/2019   Procedure: COLONOSCOPY WITH PROPOFOL;  Surgeon: Robert Bellow, MD;  Location: [facility] ENDOSCOPY;  Service: Endoscopy;  Laterality: N/A;   TEE WITHOUT CARDIOVERSION N/A 12/09/2021   Procedure: TRANSESOPHAGEAL ECHOCARDIOGRAM (TEE);  Surgeon: [name], MD;  Location: [facility] ORS;  Service: Cardiovascular;  Laterality: N/A;    Social History   Socioeconomic History   Marital status: Married    Spouse name: Not on file   Number of children: Not on file   Years of education: Not on file   Highest education level: Not on file  Occupational History   Not on file  Tobacco Use   Smoking status: Former    Packs/day: 0.50    Years: 45.00    Total pack years: 22.50    Types: Cigarettes    Quit date: 2015    Years since quitting: 9.1   Smokeless tobacco: Never  Vaping Use   Vaping Use: Never used   Substance and Sexual Activity   Alcohol use: Yes    Alcohol/week: 2.0 standard drinks of alcohol    Types: 2 Glasses of wine per week    Comment: weekly   Drug use: No   Sexual activity: Not on file  Other Topics Concern   Not on file  Social History Narrative   Not on file   Social Determinants of Health   Financial Resource Strain: Low Risk  (04/05/2022)   Overall Financial Resource Strain (CARDIA)    Difficulty of Paying Living Expenses: Not hard at all  Food Insecurity: No Food Insecurity (04/05/2022)   Hunger Vital Sign    Worried About Running Out of Food in the Last Year: Never true    Ran Out of Food in the Last Year: Never true  Transportation Needs: No Transportation Needs (04/05/2022)   PRAPARE - Hydrologist (Medical): No    Lack of Transportation (Non-Medical): No  Physical Activity: Unknown (01/26/2018)   Exercise Vital Sign    Days of Exercise per Week: 0 days    Minutes of Exercise per Session: Not on file  Stress: No Stress Concern Present (04/05/2022)   [employer] [place]    Feeling  of Stress : Not at all  Social Connections: Not on file  Intimate Partner Violence: Not At Risk (04/05/2022)   Humiliation, Afraid, Rape, and Kick questionnaire    Fear of Current or Ex-Partner: No    Emotionally Abused: No    Physically Abused: No    Sexually Abused: No     Allergies  Allergen Reactions   Pollen Extract Other (See Comments)    Hay-fever. Reaction: Sneezing     Outpatient Medications Prior to Visit  Medication Sig Dispense Refill   aspirin EC 81 MG EC tablet Take 1 tablet (81 mg total) by mouth daily. Swallow whole. 30 tablet 11   atorvastatin (LIPITOR) 80 MG tablet TAKE 1 TABLET DAILY 90 tablet 3   blood glucose meter kit and supplies USE TO CHECK BLOOD SUGARS UP TO TWO TIMES DAILY 1 each 0   Blood Glucose Monitoring Suppl (FREESTYLE FREEDOM LITE) w/Device KIT 1  application by Does not apply route 2 (two) times daily. 1 kit 0   Ciclopirox (CICLOPIROX TREATMENT) 8 % KIT Apply twice daily to nail bed and cuticle 34.6 mL 1   clopidogrel (PLAVIX) 75 MG tablet Take 1 tablet (75 mg total) by mouth daily. 90 tablet 2   glucose blood (FREESTYLE LITE) test strip USE TO CHECK SUGARS TWICE A DAY 200 each 1   hydrochlorothiazide (HYDRODIURIL) 25 MG tablet TAKE 1 TABLET DAILY 90 tablet 3   Krill Oil 300 MG CAPS Take 300 mg by mouth daily.     Lancets (FREESTYLE) lancets USE TO CHECK SUGAR TWICE A DAY 200 each 3   metFORMIN (GLUCOPHAGE) 1000 MG tablet TAKE 1 TABLET TWICE A DAY WITH MEALS 180 tablet 3   Multiple Vitamins-Minerals (CENTRUM SILVER ADULT 50+ PO) Take 1 tablet by mouth daily.     sitaGLIPtin (JANUVIA) 100 MG tablet Take 1 tablet (100 mg total) by mouth daily. 90 tablet 3   Telmisartan-amLODIPine 40-10 MG TABS TAKE 1 TABLET AT BEDTIME 90 tablet 3   No facility-administered medications prior to visit.    ROS   Objective:  Physical Exam   There were no vitals filed for this visit.   on *** LPM *** RA BMI Readings from Last 3 Encounters:  08/11/22 28.43 kg/m  07/28/22 28.80 kg/m  07/27/22 28.28 kg/m   Wt Readings from Last 3 Encounters:  08/11/22 187 lb (84.8 kg)  07/28/22 189 lb 6.4 oz (85.9 kg)  07/27/22 186 lb (84.4 kg)     CBC    Component Value Date/Time   WBC 5.8 07/27/2022 1738   RBC 4.98 07/27/2022 1738   HGB 13.7 07/27/2022 1738   HCT 44.0 07/27/2022 1738   PLT 194 07/27/2022 1738   MCV 88.4 07/27/2022 1738   MCV 82.4 12/12/2015 1826   MCH 27.5 07/27/2022 1738   MCHC 31.1 07/27/2022 1738   RDW 13.6 07/27/2022 1738   LYMPHSABS 1.7 04/16/2022 0818   MONOABS 0.6 04/16/2022 0818   EOSABS 0.1 04/16/2022 0818   BASOSABS 0.0 04/16/2022 0818    ***  Chest Imaging: ***  Pulmonary Functions Testing Results:     No data to display          FeNO: ***  Pathology: ***  Echocardiogram: ***  Heart  Catheterization: ***    Assessment & Plan:   No diagnosis found.  Discussion: ***   Current Outpatient Medications:    aspirin EC 81 MG EC tablet, Take 1 tablet (81 mg total) by mouth daily. Swallow whole., Disp:  30 tablet, Rfl: 11   atorvastatin (LIPITOR) 80 MG tablet, TAKE 1 TABLET DAILY, Disp: 90 tablet, Rfl: 3   blood glucose meter kit and supplies, USE TO CHECK BLOOD SUGARS UP TO TWO TIMES DAILY, Disp: 1 each, Rfl: 0   Blood Glucose Monitoring Suppl (FREESTYLE FREEDOM LITE) w/Device KIT, 1 application by Does not apply route 2 (two) times daily., Disp: 1 kit, Rfl: 0   Ciclopirox (CICLOPIROX TREATMENT) 8 % KIT, Apply twice daily to nail bed and cuticle, Disp: 34.6 mL, Rfl: 1   clopidogrel (PLAVIX) 75 MG tablet, Take 1 tablet (75 mg total) by mouth daily., Disp: 90 tablet, Rfl: 2   glucose blood (FREESTYLE LITE) test strip, USE TO CHECK SUGARS TWICE A DAY, Disp: 200 each, Rfl: 1   hydrochlorothiazide (HYDRODIURIL) 25 MG tablet, TAKE 1 TABLET DAILY, Disp: 90 tablet, Rfl: 3   Krill Oil 300 MG CAPS, Take 300 mg by mouth daily., Disp: , Rfl:    Lancets (FREESTYLE) lancets, USE TO CHECK SUGAR TWICE A DAY, Disp: 200 each, Rfl: 3   metFORMIN (GLUCOPHAGE) 1000 MG tablet, TAKE 1 TABLET TWICE A DAY WITH MEALS, Disp: 180 tablet, Rfl: 3   Multiple Vitamins-Minerals (CENTRUM SILVER ADULT 50+ PO), Take 1 tablet by mouth daily., Disp: , Rfl:    sitaGLIPtin (JANUVIA) 100 MG tablet, Take 1 tablet (100 mg total) by mouth daily., Disp: 90 tablet, Rfl: 3   Telmisartan-amLODIPine 40-10 MG TABS, TAKE 1 TABLET AT BEDTIME, Disp: 90 tablet, Rfl: 3  I spent *** minutes dedicated to the care of this patient on the date of this encounter to include pre-visit review of records, face-to-face time with the patient discussing conditions above, post visit ordering of testing, clinical documentation with the electronic health record, making appropriate referrals as documented, and communicating necessary findings to  members of the patients care team.   Garner Nash, [facility] 08/19/2022 8:34 AM

## 2022-08-19 NOTE — Progress Notes (Signed)
Carelink Summary Report / Loop Recorder 

## 2022-09-06 ENCOUNTER — Ambulatory Visit: Payer: Medicare PPO

## 2022-09-07 ENCOUNTER — Ambulatory Visit
Admission: RE | Admit: 2022-09-07 | Discharge: 2022-09-07 | Disposition: A | Discharge status: Home / Self Care | Payer: Medicare PPO | Source: Ambulatory Visit | Attending: Acute Care | Admitting: Acute Care

## 2022-09-07 DIAGNOSIS — Z87891 Personal history of nicotine dependence: Secondary | ICD-10-CM

## 2022-09-08 ENCOUNTER — Ambulatory Visit (INDEPENDENT_AMBULATORY_CARE_PROVIDER_SITE_OTHER): Payer: Medicare PPO | Admitting: Pulmonary Disease

## 2022-09-08 ENCOUNTER — Encounter: Payer: Self-pay | Admitting: Pulmonary Disease

## 2022-09-08 VITALS — BP 130/70 | HR 84 | Ht 68.0 in | Wt 192.0 lb

## 2022-09-08 DIAGNOSIS — R911 Solitary pulmonary nodule: Secondary | ICD-10-CM

## 2022-09-08 DIAGNOSIS — Z87891 Personal history of nicotine dependence: Secondary | ICD-10-CM | POA: Diagnosis not present

## 2022-09-08 NOTE — Progress Notes (Signed)
Synopsis: Referred in March 2024 for Lung nodule by [name], MD  Subjective:   PATIENT ID: [name] GENDER: male DOB: Aug 26, 1950, MRN: [id number]  Chief Complaint  Patient presents with   Consult    Pulmonary nodule    This is a 72 year old gentleman, past medical history of diabetes hyperlipidemia, hypertension.  Patient is a former smoker.  Enrolled in lung cancer screening program time for annual follow-up.  He had his annual CT scan done yesterday which has not been read yet but upon review by myself shows that the nodule that we were following in the lower lobe on the right side is stable in comparison.  Patient has no respiratory complaints today.     Past Medical History:  Diagnosis Date   Allergy    Colon polyp    Diabetes mellitus    Hyperlipidemia    Hypertension      Family History  Problem Relation Age of Onset   Diabetes Mother    Stroke Mother    Colon cancer Mother    Cancer Sister        brain tumor   Diabetes Brother    Diabetes Maternal Grandmother    Diabetes Maternal Grandfather      Past Surgical History:  Procedure Laterality Date   COLONOSCOPY  2010   10 mm adenomatous polyp in the descending colon without atypia, [name], MD   COLONOSCOPY WITH PROPOFOL N/A 07/18/2019   Procedure: COLONOSCOPY WITH PROPOFOL;  Surgeon: Robert Bellow, MD;  Location: [facility] ENDOSCOPY;  Service: Endoscopy;  Laterality: N/A;   TEE WITHOUT CARDIOVERSION N/A 12/09/2021   Procedure: TRANSESOPHAGEAL ECHOCARDIOGRAM (TEE);  Surgeon: [name], MD;  Location: [facility] ORS;  Service: Cardiovascular;  Laterality: N/A;    Social History   Socioeconomic History   Marital status: Married    Spouse name: Not on file   Number of children: Not on file   Years of education: Not on file   Highest education level: Not on file  Occupational History   Not on file  Tobacco Use   Smoking status: Former    Packs/day: 0.50    Years: 45.00     Total pack years: 22.50    Types: Cigarettes    Quit date: 2015    Years since quitting: 9.2   Smokeless tobacco: Never  Vaping Use   Vaping Use: Never used  Substance and Sexual Activity   Alcohol use: Yes    Alcohol/week: 2.0 standard drinks of alcohol    Types: 2 Glasses of wine per week    Comment: weekly   Drug use: No   Sexual activity: Not on file  Other Topics Concern   Not on file  Social History Narrative   Not on file   Social Determinants of Health   Financial Resource Strain: Low Risk  (04/05/2022)   Overall Financial Resource Strain (CARDIA)    Difficulty of Paying Living Expenses: Not hard at all  Food Insecurity: No Food Insecurity (04/05/2022)   Hunger Vital Sign    Worried About Running Out of Food in the Last Year: Never true    Ran Out of Food in the Last Year: Never true  Transportation Needs: No Transportation Needs (04/05/2022)   PRAPARE - Hydrologist (Medical): No    Lack of Transportation (Non-Medical): No  Physical Activity: Unknown (01/26/2018)   Exercise Vital Sign    Days of Exercise per Week: 0 days  Minutes of Exercise per Session: Not on file  Stress: No Stress Concern Present (04/05/2022)   [place]    Feeling of Stress : Not at all  Social Connections: Not on file  Intimate Partner Violence: Not At Risk (04/05/2022)   Humiliation, Afraid, Rape, and Kick questionnaire    Fear of Current or Ex-Partner: No    Emotionally Abused: No    Physically Abused: No    Sexually Abused: No     Allergies  Allergen Reactions   Pollen Extract Other (See Comments)    Hay-fever. Reaction: Sneezing     Outpatient Medications Prior to Visit  Medication Sig Dispense Refill   aspirin EC 81 MG EC tablet Take 1 tablet (81 mg total) by mouth daily. Swallow whole. 30 tablet 11   atorvastatin (LIPITOR) 80 MG tablet TAKE 1 TABLET DAILY 90 tablet 3   blood glucose  meter kit and supplies USE TO CHECK BLOOD SUGARS UP TO TWO TIMES DAILY 1 each 0   Blood Glucose Monitoring Suppl (FREESTYLE FREEDOM LITE) w/Device KIT 1 application by Does not apply route 2 (two) times daily. 1 kit 0   Ciclopirox (CICLOPIROX TREATMENT) 8 % KIT Apply twice daily to nail bed and cuticle 34.6 mL 1   glucose blood (FREESTYLE LITE) test strip USE TO CHECK SUGARS TWICE A DAY 200 each 1   hydrochlorothiazide (HYDRODIURIL) 25 MG tablet TAKE 1 TABLET DAILY 90 tablet 3   Krill Oil 300 MG CAPS Take 300 mg by mouth daily.     Lancets (FREESTYLE) lancets USE TO CHECK SUGAR TWICE A DAY 200 each 3   metFORMIN (GLUCOPHAGE) 1000 MG tablet TAKE 1 TABLET TWICE A DAY WITH MEALS 180 tablet 3   Multiple Vitamins-Minerals (CENTRUM SILVER ADULT 50+ PO) Take 1 tablet by mouth daily.     sitaGLIPtin (JANUVIA) 100 MG tablet Take 1 tablet (100 mg total) by mouth daily. 90 tablet 3   Telmisartan-amLODIPine 40-10 MG TABS TAKE 1 TABLET AT BEDTIME 90 tablet 3   No facility-administered medications prior to visit.    Review of Systems  Constitutional:  Negative for chills, fever, malaise/fatigue and weight loss.  HENT:  Negative for hearing loss, sore throat and tinnitus.   Eyes:  Negative for blurred vision and double vision.  Respiratory:  Negative for cough, hemoptysis, sputum production, shortness of breath, wheezing and stridor.   Cardiovascular:  Negative for chest pain, palpitations, orthopnea, leg swelling and PND.  Gastrointestinal:  Negative for abdominal pain, constipation, diarrhea, heartburn, nausea and vomiting.  Genitourinary:  Negative for dysuria, hematuria and urgency.  Musculoskeletal:  Negative for joint pain and myalgias.  Skin:  Negative for itching and rash.  Neurological:  Negative for dizziness, tingling, weakness and headaches.  Endo/Heme/Allergies:  Negative for environmental allergies. Does not bruise/bleed easily.  Psychiatric/Behavioral:  Negative for depression. The patient  is not nervous/anxious and does not have insomnia.   All other systems reviewed and are negative.    Objective:  Physical Exam Vitals reviewed.  Constitutional:      General: He is not in acute distress.    Appearance: He is well-developed.  HENT:     Head: Normocephalic and atraumatic.  Eyes:     General: No scleral icterus.    Conjunctiva/sclera: Conjunctivae normal.     Pupils: Pupils are equal, round, and reactive to light.  Neck:     Vascular: No JVD.     Trachea: No tracheal deviation.  Cardiovascular:     Rate and Rhythm: Normal rate and regular rhythm.     Heart sounds: Normal heart sounds. No murmur heard. Pulmonary:     Effort: Pulmonary effort is normal. No tachypnea, accessory muscle usage or respiratory distress.     Breath sounds: No stridor. No wheezing, rhonchi or rales.  Abdominal:     General: There is no distension.     Palpations: Abdomen is soft.     Tenderness: There is no abdominal tenderness.  Musculoskeletal:        General: No tenderness.     Cervical back: Neck supple.  Lymphadenopathy:     Cervical: No cervical adenopathy.  Skin:    General: Skin is warm and dry.     Capillary Refill: Capillary refill takes less than 2 seconds.     Findings: No rash.  Neurological:     Mental Status: He is alert and oriented to person, place, and time.  Psychiatric:        Behavior: Behavior normal.      Vitals:   09/08/22 0924  BP: 130/70  Pulse: 84  SpO2: 98%  Weight: 192 lb (87.1 kg)  Height: '5\' 8"'$  (1.727 m)   98% on RA BMI Readings from Last 3 Encounters:  09/08/22 29.19 kg/m  08/11/22 28.43 kg/m  07/28/22 28.80 kg/m   Wt Readings from Last 3 Encounters:  09/08/22 192 lb (87.1 kg)  08/11/22 187 lb (84.8 kg)  07/28/22 189 lb 6.4 oz (85.9 kg)     CBC    Component Value Date/Time   WBC 5.8 07/27/2022 1738   RBC 4.98 07/27/2022 1738   HGB 13.7 07/27/2022 1738   HCT 44.0 07/27/2022 1738   PLT 194 07/27/2022 1738   MCV 88.4  07/27/2022 1738   MCV 82.4 12/12/2015 1826   MCH 27.5 07/27/2022 1738   MCHC 31.1 07/27/2022 1738   RDW 13.6 07/27/2022 1738   LYMPHSABS 1.7 04/16/2022 0818   MONOABS 0.6 04/16/2022 0818   EOSABS 0.1 04/16/2022 0818   BASOSABS 0.0 04/16/2022 0818     Chest Imaging:  Lung cancer screening CT March 2024: Lung nodule within the right lower lobe appears stable to me.  Final read not completed yet. lung cancer screening CT coronaries follow-up. The patient's images have been independently reviewed by me.    Pulmonary Functions Testing Results:     No data to display          FeNO:   Pathology:   Echocardiogram:   Heart Catheterization:     Assessment & Plan:     ICD-10-CM   1. Lung nodule  R91.1     2. Former smoker  Z87.891       Discussion:  72 year old gentleman with a lung nodule on lung cancer screening CT.  Stable compared to last year.  Former smoker.  Plan: Continue lung cancer screening CT annually. He does not really have any significant symptoms at this time. He does have some emphysema on his CT.  He has not had pulmonary function test in the past. Not interested in using inhaler at this time. Continue annual screening. I will send a note over to our coordinators to ensure repeat annual screenings complete.   Current Outpatient Medications:    aspirin EC 81 MG EC tablet, Take 1 tablet (81 mg total) by mouth daily. Swallow whole., Disp: 30 tablet, Rfl: 11   atorvastatin (LIPITOR) 80 MG tablet, TAKE 1 TABLET DAILY, Disp: 90 tablet, Rfl: 3  blood glucose meter kit and supplies, USE TO CHECK BLOOD SUGARS UP TO TWO TIMES DAILY, Disp: 1 each, Rfl: 0   Blood Glucose Monitoring Suppl (FREESTYLE FREEDOM LITE) w/Device KIT, 1 application by Does not apply route 2 (two) times daily., Disp: 1 kit, Rfl: 0   Ciclopirox (CICLOPIROX TREATMENT) 8 % KIT, Apply twice daily to nail bed and cuticle, Disp: 34.6 mL, Rfl: 1   glucose blood (FREESTYLE LITE) test strip,  USE TO CHECK SUGARS TWICE A DAY, Disp: 200 each, Rfl: 1   hydrochlorothiazide (HYDRODIURIL) 25 MG tablet, TAKE 1 TABLET DAILY, Disp: 90 tablet, Rfl: 3   Krill Oil 300 MG CAPS, Take 300 mg by mouth daily., Disp: , Rfl:    Lancets (FREESTYLE) lancets, USE TO CHECK SUGAR TWICE A DAY, Disp: 200 each, Rfl: 3   metFORMIN (GLUCOPHAGE) 1000 MG tablet, TAKE 1 TABLET TWICE A DAY WITH MEALS, Disp: 180 tablet, Rfl: 3   Multiple Vitamins-Minerals (CENTRUM SILVER ADULT 50+ PO), Take 1 tablet by mouth daily., Disp: , Rfl:    sitaGLIPtin (JANUVIA) 100 MG tablet, Take 1 tablet (100 mg total) by mouth daily., Disp: 90 tablet, Rfl: 3   Telmisartan-amLODIPine 40-10 MG TABS, TAKE 1 TABLET AT BEDTIME, Disp: 90 tablet, Rfl: 3   Garner Nash, DO [facility] 09/08/2022 10:19 AM

## 2022-09-08 NOTE — Patient Instructions (Addendum)
Thank you for visiting Dr. Valeta Harms at [facility]. Today we recommend the following:  Yearly CT next year LDCT coordinators will schedule  Return in about 1 year (around 09/08/2023) for with Eric Form, NP, or Dr. Valeta Harms.    Please do your part to reduce the spread of COVID-19.

## 2022-09-09 ENCOUNTER — Other Ambulatory Visit: Payer: Self-pay

## 2022-09-09 DIAGNOSIS — Z87891 Personal history of nicotine dependence: Secondary | ICD-10-CM

## 2022-09-20 ENCOUNTER — Ambulatory Visit (INDEPENDENT_AMBULATORY_CARE_PROVIDER_SITE_OTHER): Payer: Medicare PPO

## 2022-09-20 DIAGNOSIS — I639 Cerebral infarction, unspecified: Secondary | ICD-10-CM | POA: Diagnosis not present

## 2022-09-20 LAB — CUP PACEART REMOTE DEVICE CHECK
Date Time Interrogation Session: 20240324232400
Implantable Pulse Generator Implant Date: 20231004

## 2022-09-27 NOTE — Progress Notes (Signed)
Carelink Summary Report / Loop Recorder 

## 2022-09-30 DIAGNOSIS — K7581 Nonalcoholic steatohepatitis (NASH): Secondary | ICD-10-CM | POA: Diagnosis not present

## 2022-09-30 DIAGNOSIS — Q2112 Patent foramen ovale: Secondary | ICD-10-CM | POA: Insufficient documentation

## 2022-09-30 DIAGNOSIS — K7401 Hepatic fibrosis, early fibrosis: Secondary | ICD-10-CM | POA: Diagnosis not present

## 2022-10-18 ENCOUNTER — Telehealth: Payer: Self-pay | Admitting: Internal Medicine

## 2022-10-18 MED ORDER — HYDROCHLOROTHIAZIDE 25 MG PO TABS: 25.0000 mg | ORAL_TABLET | Freq: Every day | ORAL | 3 refills | Status: DC

## 2022-10-18 NOTE — Telephone Encounter (Signed)
Prescription Request  10/18/2022  LOV: 07/28/2022  What is the name of the medication or equipment? hydrochlorothiazide (HYDRODIURIL) 25 MG tablet   Have you contacted your pharmacy to request a refill? No   Which pharmacy would you like this sent to?   [id number] - [place], [place] - [address] [facility] [address] [facility] [address] [place] [address] Phone: [phone] Fax: [phone]     Patient notified that their request is being sent to the clinical staff for review and that they should receive a response within 2 business days.   Please advise at Mobile [phone] (mobile)

## 2022-10-18 NOTE — Telephone Encounter (Signed)
Medication has been refilled.

## 2022-10-18 NOTE — Addendum Note (Signed)
Addended by: Sandy Salaam on: 10/18/2022 04:56 PM   Modules accepted: Orders

## 2022-10-22 ENCOUNTER — Telehealth: Payer: Self-pay | Admitting: Internal Medicine

## 2022-10-22 MED ORDER — ATORVASTATIN CALCIUM 80 MG PO TABS: 80.0000 mg | ORAL_TABLET | Freq: Every day | ORAL | 3 refills | Status: DC

## 2022-10-22 NOTE — Addendum Note (Signed)
Addended by: Sandy Salaam on: 10/22/2022 01:04 PM   Modules accepted: Orders

## 2022-10-22 NOTE — Telephone Encounter (Signed)
Medication has been refilled.

## 2022-10-22 NOTE — Telephone Encounter (Signed)
Prescription Request  10/22/2022  LOV: 07/28/2022  What is the name of the medication or equipment? atorvastatin (LIPITOR) 80 MG tablet and Clopidogrel 75mg    Have you contacted your pharmacy to request a refill? Yes   Which pharmacy would you like this sent to?   [id number] - [place], [place] - [address] AT [facility] [address] [facility] [address] [place] [address] Phone: [phone] Fax: [phone]     Patient notified that their request is being sent to the clinical staff for review and that they should receive a response within 2 business days.   Please advise at Mobile [phone] (mobile)

## 2022-10-25 ENCOUNTER — Ambulatory Visit (INDEPENDENT_AMBULATORY_CARE_PROVIDER_SITE_OTHER): Payer: Medicare PPO

## 2022-10-25 DIAGNOSIS — I639 Cerebral infarction, unspecified: Secondary | ICD-10-CM

## 2022-10-25 LAB — CUP PACEART REMOTE DEVICE CHECK
Date Time Interrogation Session: 20240426230634
Implantable Pulse Generator Implant Date: 20231004

## 2022-10-27 ENCOUNTER — Telehealth: Payer: Self-pay | Admitting: Internal Medicine

## 2022-10-27 MED ORDER — CLOPIDOGREL BISULFATE 75 MG PO TABS: 75.0000 mg | ORAL_TABLET | Freq: Every day | ORAL | 2 refills | Status: DC

## 2022-10-27 NOTE — Telephone Encounter (Signed)
Plavix is not in current medication list. Last office note states that he is taking without any issues. Is it okay to refill?

## 2022-10-27 NOTE — Telephone Encounter (Signed)
Prescription Request  10/27/2022  LOV: 07/28/2022  What is the name of the medication or equipment? Clopidogrel(Plavix) Bisulfate tabs 75 mg  Have you contacted your pharmacy to request a refill? Yes   Which pharmacy would you like this sent to?   [id number] - [place], [place] - [address] AT [facility] [address] [facility] [address] [place] [address] Phone: [phone] Fax: [phone]    Patient notified that their request is being sent to the clinical staff for review and that they should receive a response within 2 business days.   Please advise at Mobile [phone] (mobile)

## 2022-10-28 NOTE — Telephone Encounter (Signed)
noted 

## 2022-10-28 NOTE — Progress Notes (Signed)
Carelink Summary Report / Loop Recorder 

## 2022-11-03 ENCOUNTER — Telehealth: Payer: Self-pay | Admitting: Internal Medicine

## 2022-11-03 MED ORDER — METFORMIN HCL 1000 MG PO TABS: 1000.0000 mg | ORAL_TABLET | Freq: Two times a day (BID) | ORAL | 3 refills | Status: DC

## 2022-11-03 NOTE — Telephone Encounter (Signed)
Pt need a refill on Metformin  sent to [facility]

## 2022-11-03 NOTE — Telephone Encounter (Signed)
Medication has been refilled.

## 2022-11-15 ENCOUNTER — Telehealth: Payer: Self-pay | Admitting: Internal Medicine

## 2022-11-15 MED ORDER — TELMISARTAN-AMLODIPINE 40-10 MG PO TABS: 1.0000 | ORAL_TABLET | Freq: Every day | ORAL | 3 refills | Status: DC

## 2022-11-15 NOTE — Telephone Encounter (Signed)
Medication has been refilled.

## 2022-11-15 NOTE — Telephone Encounter (Signed)
Pt need a refill on telmisartan-amLODIPine sent to [facility]

## 2022-11-18 MED ORDER — TELMISARTAN-AMLODIPINE 40-10 MG PO TABS: 1.0000 | ORAL_TABLET | Freq: Every day | ORAL | 3 refills | Status: DC

## 2022-11-18 NOTE — Addendum Note (Signed)
Addended by: Shelby Dubin on: 11/18/2022 02:53 PM   Modules accepted: Orders

## 2022-11-18 NOTE — Telephone Encounter (Signed)
Rx sent to pharmacy   

## 2022-11-18 NOTE — Telephone Encounter (Signed)
Pt called stating he would like to cancel the [facility] at [facility] and get [facility] because its cheaper

## 2022-11-25 NOTE — Progress Notes (Signed)
Carelink Summary Report / Loop Recorder 

## 2022-11-29 ENCOUNTER — Ambulatory Visit (INDEPENDENT_AMBULATORY_CARE_PROVIDER_SITE_OTHER): Payer: Medicare PPO

## 2022-11-29 DIAGNOSIS — I639 Cerebral infarction, unspecified: Secondary | ICD-10-CM

## 2022-11-29 LAB — CUP PACEART REMOTE DEVICE CHECK
Date Time Interrogation Session: 20240602231423
Implantable Pulse Generator Implant Date: 20231004

## 2022-12-13 DIAGNOSIS — E119 Type 2 diabetes mellitus without complications: Secondary | ICD-10-CM | POA: Diagnosis not present

## 2022-12-13 DIAGNOSIS — Z0100 Encounter for examination of eyes and vision without abnormal findings: Secondary | ICD-10-CM | POA: Diagnosis not present

## 2022-12-13 DIAGNOSIS — H40003 Preglaucoma, unspecified, bilateral: Secondary | ICD-10-CM | POA: Diagnosis not present

## 2022-12-13 DIAGNOSIS — H2513 Age-related nuclear cataract, bilateral: Secondary | ICD-10-CM | POA: Diagnosis not present

## 2022-12-13 LAB — HM DIABETES EYE EXAM

## 2022-12-21 NOTE — Progress Notes (Signed)
Carelink Summary Report / Loop Recorder 

## 2023-01-03 ENCOUNTER — Ambulatory Visit: Payer: Medicare PPO

## 2023-01-03 DIAGNOSIS — I639 Cerebral infarction, unspecified: Secondary | ICD-10-CM

## 2023-01-03 LAB — CUP PACEART REMOTE DEVICE CHECK
Date Time Interrogation Session: 20240705230919
Implantable Pulse Generator Implant Date: 20231004

## 2023-01-10 ENCOUNTER — Telehealth: Payer: Self-pay | Admitting: Internal Medicine

## 2023-01-10 MED ORDER — SITAGLIPTIN PHOSPHATE 100 MG PO TABS: 100.0000 mg | ORAL_TABLET | Freq: Every day | ORAL | 3 refills | Status: DC

## 2023-01-10 MED ORDER — FREESTYLE LANCETS MISC: 3 refills | Status: DC

## 2023-01-10 MED ORDER — FREESTYLE LITE TEST VI STRP: ORAL_STRIP | 1 refills | Status: DC

## 2023-01-10 NOTE — Telephone Encounter (Signed)
refilled 

## 2023-01-10 NOTE — Telephone Encounter (Signed)
Prescription Request  01/10/2023  LOV: 07/28/2022  What is the name of the medication or equipment? sitaGLIPtin (JANUVIA) 100 MG tablet  and  Lancets (FREESTYLE) lancets and Free Style lite test stripes     Have you contacted your pharmacy to request a refill? No   Which pharmacy would you like this sent to?    [id number] - [place], [place] - [address] AT [facility] [address] [facility] [address] [place] [address] Phone: [phone] Fax: [phone]  [phone]    Patient notified that their request is being sent to the clinical staff for review and that they should receive a response within 2 business days.   Please advise at [facility] [phone]

## 2023-01-11 MED ORDER — FREESTYLE LANCETS MISC: 3 refills | Status: DC

## 2023-01-11 MED ORDER — FREESTYLE LITE TEST VI STRP: ORAL_STRIP | 1 refills | Status: DC

## 2023-01-11 MED ORDER — SITAGLIPTIN PHOSPHATE 100 MG PO TABS: 100.0000 mg | ORAL_TABLET | Freq: Every day | ORAL | 3 refills | Status: DC

## 2023-01-11 NOTE — Telephone Encounter (Signed)
Resent to [facility] 

## 2023-01-11 NOTE — Telephone Encounter (Signed)
Pt called in asking if Dr. Darrick Huntsman can sent previous refills to [facility]. Pt stated that with [facility] its [facility]$ and [facility] only 38$. Pt canceled [facility] already.

## 2023-01-11 NOTE — Addendum Note (Signed)
Addended by: Sandy Salaam on: 01/11/2023 11:40 AM   Modules accepted: Orders

## 2023-01-17 ENCOUNTER — Telehealth: Payer: Self-pay

## 2023-01-17 NOTE — Telephone Encounter (Signed)
Following alert received from [employer] received for  tachycardia 7/19 @ 14:39, mean HR 200. EGM shows narrow, regular rhythm. Route to triage for first tachy.  Patient denies any symptoms and compliant with medications. Advised I will forward to MD for review and if changes someone will call. Patient aware of call if any further symptoms arise.

## 2023-01-18 ENCOUNTER — Emergency Department: Payer: Medicare PPO

## 2023-01-18 ENCOUNTER — Other Ambulatory Visit: Payer: Self-pay

## 2023-01-18 ENCOUNTER — Emergency Department
Admission: EM | Admit: 2023-01-18 | Discharge: 2023-01-18 | Disposition: A | Discharge status: Home / Self Care | Payer: Medicare PPO | Source: Home / Self Care | Attending: Emergency Medicine | Admitting: Emergency Medicine

## 2023-01-18 DIAGNOSIS — S5001XA Contusion of right elbow, initial encounter: Secondary | ICD-10-CM | POA: Diagnosis not present

## 2023-01-18 DIAGNOSIS — S0083XA Contusion of other part of head, initial encounter: Secondary | ICD-10-CM | POA: Diagnosis not present

## 2023-01-18 DIAGNOSIS — K5730 Diverticulosis of large intestine without perforation or abscess without bleeding: Secondary | ICD-10-CM | POA: Diagnosis not present

## 2023-01-18 DIAGNOSIS — X58XXXA Exposure to other specified factors, initial encounter: Secondary | ICD-10-CM | POA: Diagnosis not present

## 2023-01-18 DIAGNOSIS — K760 Fatty (change of) liver, not elsewhere classified: Secondary | ICD-10-CM | POA: Diagnosis not present

## 2023-01-18 DIAGNOSIS — R1031 Right lower quadrant pain: Secondary | ICD-10-CM | POA: Diagnosis not present

## 2023-01-18 DIAGNOSIS — S301XXA Contusion of abdominal wall, initial encounter: Secondary | ICD-10-CM | POA: Diagnosis not present

## 2023-01-18 DIAGNOSIS — S3991XA Unspecified injury of abdomen, initial encounter: Secondary | ICD-10-CM | POA: Diagnosis present

## 2023-01-18 DIAGNOSIS — Y9339 Activity, other involving climbing, rappelling and jumping off: Secondary | ICD-10-CM | POA: Diagnosis not present

## 2023-01-18 DIAGNOSIS — Y92830 Public park as the place of occurrence of the external cause: Secondary | ICD-10-CM | POA: Diagnosis not present

## 2023-01-18 DIAGNOSIS — S0990XA Unspecified injury of head, initial encounter: Secondary | ICD-10-CM | POA: Diagnosis not present

## 2023-01-18 DIAGNOSIS — E119 Type 2 diabetes mellitus without complications: Secondary | ICD-10-CM | POA: Diagnosis not present

## 2023-01-18 DIAGNOSIS — I10 Essential (primary) hypertension: Secondary | ICD-10-CM | POA: Diagnosis not present

## 2023-01-18 DIAGNOSIS — S8002XA Contusion of left knee, initial encounter: Secondary | ICD-10-CM | POA: Diagnosis not present

## 2023-01-18 DIAGNOSIS — Z8673 Personal history of transient ischemic attack (TIA), and cerebral infarction without residual deficits: Secondary | ICD-10-CM | POA: Diagnosis not present

## 2023-01-18 LAB — COMPREHENSIVE METABOLIC PANEL
ALT: 55 U/L — ABNORMAL HIGH (ref 0–44)
AST: 48 U/L — ABNORMAL HIGH (ref 15–41)
Albumin: 4.5 g/dL (ref 3.5–5.0)
Alkaline Phosphatase: 56 U/L (ref 38–126)
Anion gap: 10 (ref 5–15)
BUN: 11 mg/dL (ref 8–23)
CO2: 23 mmol/L (ref 22–32)
Calcium: 9.3 mg/dL (ref 8.9–10.3)
Chloride: 99 mmol/L (ref 98–111)
Creatinine, Ser: 0.73 mg/dL (ref 0.61–1.24)
GFR, Estimated: >60 mL/min (ref >60)
Glucose, Bld: 107 mg/dL — ABNORMAL HIGH (ref 70–99)
Potassium: 3.6 mmol/L (ref 3.5–5.1)
Sodium: 132 mmol/L — ABNORMAL LOW (ref 135–145)
Total Bilirubin: 1.5 mg/dL — ABNORMAL HIGH (ref 0.3–1.2)
Total Protein: 7.6 g/dL (ref 6.5–8.1)

## 2023-01-18 LAB — CBC WITH DIFFERENTIAL/PLATELET
Abs Immature Granulocytes: 0.02 10*3/uL (ref 0.00–0.07)
Basophils Absolute: 0 10*3/uL (ref 0.0–0.1)
Basophils Relative: 0 %
Eosinophils Absolute: 0.1 10*3/uL (ref 0.0–0.5)
Eosinophils Relative: 2 %
HCT: 38.5 % — ABNORMAL LOW (ref 39.0–52.0)
Hemoglobin: 12.5 g/dL — ABNORMAL LOW (ref 13.0–17.0)
Immature Granulocytes: 0 %
Lymphocytes Relative: 24 %
Lymphs Abs: 1.5 10*3/uL (ref 0.7–4.0)
MCH: 28.3 pg (ref 26.0–34.0)
MCHC: 32.5 g/dL (ref 30.0–36.0)
MCV: 87.1 fL (ref 80.0–100.0)
Monocytes Absolute: 0.7 10*3/uL (ref 0.1–1.0)
Monocytes Relative: 12 %
Neutro Abs: 3.8 10*3/uL (ref 1.7–7.7)
Neutrophils Relative %: 62 %
Platelets: 212 10*3/uL (ref 150–400)
RBC: 4.42 MIL/uL (ref 4.22–5.81)
RDW: 13.2 % (ref 11.5–15.5)
WBC: 6.1 10*3/uL (ref 4.0–10.5)
nRBC: 0 % (ref 0.0–0.2)

## 2023-01-18 MED ORDER — IOHEXOL 300 MG/ML  SOLN
100.0000 mL | Freq: Once | INTRAMUSCULAR | Status: AC | PRN
  Administered 2023-01-18: 100 mL via INTRAVENOUS

## 2023-01-18 NOTE — ED Triage Notes (Addendum)
Pt arrives via Pov w/ c/oabdominal pain and a lump to RLQ. Pt states he fell last Tuesday and now has large area of bruising to lower abd and the lump that started x2 days ago that he wants to get checked out.  Pt does take plavix.

## 2023-01-18 NOTE — Progress Notes (Signed)
Carelink Summary Report / Loop Recorder 
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
## 2023-01-18 NOTE — Discharge Instructions (Addendum)
Fortunately your testing did not show any dangerous injuries like bleeding in the brain or inside of the abdomen.    Take Tylenol 650 mg every 6 hours for pain.  Follow-up with your doctor this week for checkup.  Thank you for choosing [place] for your health care today!  Please see your primary doctor this week for a follow up appointment.   If you have any new, worsening, or unexpected symptoms call your doctor right away or come back to the emergency department for reevaluation.  It was my pleasure to care for you today.   Daneil Dan Modesto Charon, MD

## 2023-01-24 ENCOUNTER — Telehealth: Payer: Self-pay | Admitting: Internal Medicine

## 2023-01-24 NOTE — Telephone Encounter (Signed)
Pt called in stating that he was in the ED on 01/18/23, and as per ed notes to f/u with provider. Pt already has an appt on 02/23/23, however, theres no available openings before then.

## 2023-01-25 ENCOUNTER — Encounter: Payer: Self-pay | Admitting: Emergency Medicine

## 2023-01-25 ENCOUNTER — Emergency Department
Admission: EM | Admit: 2023-01-25 | Discharge: 2023-01-25 | Disposition: A | Discharge status: Home / Self Care | Payer: Medicare PPO | Attending: Emergency Medicine | Admitting: Emergency Medicine

## 2023-01-25 ENCOUNTER — Emergency Department: Payer: Medicare PPO

## 2023-01-25 ENCOUNTER — Other Ambulatory Visit: Payer: Self-pay

## 2023-01-25 DIAGNOSIS — Z86010 Personal history of colonic polyps: Secondary | ICD-10-CM | POA: Diagnosis not present

## 2023-01-25 DIAGNOSIS — F17200 Nicotine dependence, unspecified, uncomplicated: Secondary | ICD-10-CM | POA: Diagnosis not present

## 2023-01-25 DIAGNOSIS — E119 Type 2 diabetes mellitus without complications: Secondary | ICD-10-CM | POA: Diagnosis not present

## 2023-01-25 DIAGNOSIS — M7021 Olecranon bursitis, right elbow: Secondary | ICD-10-CM | POA: Insufficient documentation

## 2023-01-25 DIAGNOSIS — M8580 Other specified disorders of bone density and structure, unspecified site: Secondary | ICD-10-CM | POA: Diagnosis not present

## 2023-01-25 DIAGNOSIS — W01198A Fall on same level from slipping, tripping and stumbling with subsequent striking against other object, initial encounter: Secondary | ICD-10-CM | POA: Insufficient documentation

## 2023-01-25 DIAGNOSIS — S8991XA Unspecified injury of right lower leg, initial encounter: Secondary | ICD-10-CM | POA: Diagnosis present

## 2023-01-25 DIAGNOSIS — S0990XA Unspecified injury of head, initial encounter: Secondary | ICD-10-CM | POA: Diagnosis not present

## 2023-01-25 DIAGNOSIS — M25521 Pain in right elbow: Secondary | ICD-10-CM | POA: Diagnosis not present

## 2023-01-25 DIAGNOSIS — S8001XA Contusion of right knee, initial encounter: Secondary | ICD-10-CM | POA: Diagnosis not present

## 2023-01-25 DIAGNOSIS — Y9301 Activity, walking, marching and hiking: Secondary | ICD-10-CM | POA: Diagnosis not present

## 2023-01-25 DIAGNOSIS — I10 Essential (primary) hypertension: Secondary | ICD-10-CM | POA: Diagnosis not present

## 2023-01-25 DIAGNOSIS — M25561 Pain in right knee: Secondary | ICD-10-CM | POA: Diagnosis not present

## 2023-01-25 DIAGNOSIS — Z043 Encounter for examination and observation following other accident: Secondary | ICD-10-CM | POA: Diagnosis not present

## 2023-01-25 DIAGNOSIS — Y92009 Unspecified place in unspecified non-institutional (private) residence as the place of occurrence of the external cause: Secondary | ICD-10-CM

## 2023-01-25 DIAGNOSIS — S5001XA Contusion of right elbow, initial encounter: Secondary | ICD-10-CM | POA: Diagnosis not present

## 2023-01-25 NOTE — Telephone Encounter (Signed)
Noted  

## 2023-01-25 NOTE — ED Triage Notes (Addendum)
Pt states that he wasn't being careful walking through a cluttered room and fell today, pt has a large amount os swelling to his right knee and right elbow, states that he did hit his head a little on the door, denies loc. Pt using a golf club to assist him with ambulation

## 2023-01-25 NOTE — Discharge Instructions (Addendum)
Your x-rays and CT scans are negative for any acute bony injury.  You likely have a hematoma over your knee as we discussed, and you have a bursitis of your elbow as we discussed.  You may wear the compression wraps to help with the swelling, though do not wear 24 hours a day as this will increase your risk for developing a blood clot.  Please also apply cool compresses to the area, 20 minutes on, 20 minutes off multiple times per day.  Please follow-up with your outpatient providers/orthopedics if your symptoms persist.  Return for any new, worsening, or change in symptoms or other concerns.  It was a pleasure caring for you today.

## 2023-01-25 NOTE — ED Notes (Signed)
See triage note  Presents s/p fall this am   States just tripped and fell  Landed on right knee and elbow

## 2023-01-25 NOTE — ED Provider Notes (Signed)
[facility] Provider Note    Event Date/Time   First MD Initiated Contact with Patient 01/25/23 [phone]     (approximate)   History   Fall, Knee Injury, and Elbow Pain   HPI  Robert Banner. is a 72 y.o. male with a past medical history of CVA on Plavix he presents today for evaluation after a trip and fall.  Patient reports that he was walking through a cluttered room and he tripped over something and landed on his right knee and his right elbow.  He reports that he also hit his head, the denies any headache or loss of consciousness.  He denies neck pain.  He has been able to ambulate with a cane since the event.  He denies nausea or vomiting.  Patient Active Problem List   Diagnosis Date Noted   Aortic atherosclerosis (HCC) 07/29/2022   History of CVA (cerebrovascular accident) without residual deficits 10/08/2021   Hospital discharge follow-up 10/08/2021   Right sided numbness 10/06/2021   HLD (hyperlipidemia) 10/06/2021   Alcohol use 10/06/2021   Emphysema lung (HCC) 09/16/2021   Pulmonary nodule, right 09/07/2020   Meralgia paraesthetica, right 03/08/2020   PSA elevation 07/06/2019   Prostate cancer screening 01/28/2019   Educated about COVID-19 virus infection 10/13/2018   Diabetic nephropathy associated with type 2 diabetes mellitus (HCC) 07/10/2017   Epistaxis 11/14/2015   Hepatic steatosis 01/28/2015   Obesity (BMI 30-39.9) 08/09/2014   Diverticulosis of colon without hemorrhage 08/07/2014   Encounter for screening colonoscopy 06/03/2014   Essential hypertension 05/11/2014   DM type 2 with diabetic mixed hyperlipidemia (HCC) 05/11/2014   Tubular adenoma of colon 05/11/2014   History of tobacco abuse 05/11/2014          Physical Exam   Triage Vital Signs: ED Triage Vitals  Encounter Vitals Group     BP 01/25/23 0955 125/74     Systolic BP Percentile --      Diastolic BP Percentile --      Pulse Rate 01/25/23 0955 79      Resp 01/25/23 0955 18     Temp 01/25/23 0955 98.6 F (37 C)     Temp Source 01/25/23 0955 Oral     SpO2 01/25/23 0955 98 %     Weight --      Height --      Head Circumference --      Peak Flow --      Pain Score 01/25/23 0957 6     Pain Loc --      Pain Education --      Exclude from Growth Chart --     Most recent vital signs: Vitals:   01/25/23 0955  BP: 125/74  Pulse: 79  Resp: 18  Temp: 98.6 F (37 C)  SpO2: 98%    Physical Exam Vitals and nursing note reviewed.  Constitutional:      General: Awake and alert. No acute distress.    Appearance: Normal appearance. The patient is normal weight.  HENT:     Head: Normocephalic and atraumatic.     Mouth: Mucous membranes are moist.  Eyes:     General: PERRL. Normal EOMs        Right eye: No discharge.        Left eye: No discharge.     Conjunctiva/sclera: Conjunctivae normal.  Cardiovascular:     Rate and Rhythm: Normal rate and regular rhythm.     Pulses: Normal pulses.  Pulmonary:     Effort: Pulmonary effort is normal. No respiratory distress.     Breath sounds: Normal breath sounds.  Abdominal:     Abdomen is soft. There is no abdominal tenderness. No rebound or guarding. No distention. Musculoskeletal:        General: No swelling. Normal range of motion.     Cervical back: Normal range of motion and neck supple. No midline cervical spine tenderness.  Full range of motion of neck.  Negative Spurling test.  Negative Lhermitte sign.  Normal strength and sensation in bilateral upper extremities. Normal grip strength bilaterally.  Normal intrinsic muscle function of the hand bilaterally.  Normal radial pulses bilaterally. Left knee: Swelling with faint ecchymosis over the patella.  No deformity or rash. No joint line tenderness. No patellar tenderness Warm and well perfused extremity with 2+ pedal pulses 5/5 strength to dorsiflexion and plantarflexion at the ankle with intact sensation throughout extremity Normal  range of motion of the knee, with intact flexion and extension to active and passive range of motion. Extensor mechanism intact. No ligamentous laxity. Negative anterior/posterior drawer/negative lachman, negative mcmurrays No effusion or warmth Intact quadriceps, hamstring function, patellar tendon function Pelvis stable Full ROM of ankle without pain or swelling Foot warm and well perfused Right elbow: Full and normal range of motion of elbow, normal supination and pronation.  There is a marble sized bursitis noted over the olecranon.  There is no redness or warmth. Skin:    General: Skin is warm and dry.     Capillary Refill: Capillary refill takes less than 2 seconds.     Findings: No rash.  Neurological:     Mental Status: The patient is awake and alert.   Neurological: GCS 15 alert and oriented x3 Normal speech, no expressive or receptive aphasia or dysarthria Cranial nerves II through XII intact Normal visual fields 5 out of 5 strength in all 4 extremities with intact sensation throughout No extremity drift Normal finger-to-nose testing, no limb or truncal ataxia    ED Results / Procedures / Treatments   Labs (all labs ordered are listed, but only abnormal results are displayed) Labs Reviewed - No data to display   EKG     RADIOLOGY I independently reviewed and interpreted imaging and agree with radiologists findings.     PROCEDURES:  Critical Care performed:   Procedures   MEDICATIONS ORDERED IN ED: Medications - No data to display   IMPRESSION / MDM / ASSESSMENT AND PLAN / ED COURSE  I reviewed the triage vital signs and the nursing notes.   Differential diagnosis includes, but is not limited to, fracture, dislocation, hematoma, intracranial hemorrhage, cervical spine injury, bursitis.  Patient is awake and alert, hemodynamically stable and afebrile.  He has obvious swelling over his right knee and his right elbow, though he has full and normal  range of motion of both.  I suspect traumatic hematoma given that he is on Lasix.  He is able to weight-bear.  CT head and neck obtained per [place] criteria are negative for any acute findings.  X-ray of his knee feels soft tissue swelling anterior to the patella and the quadriceps tendon.  His extensor mechanism is intact and he is able to weight-bear, I do not suspect quadriceps or patellar tendon rupture.  His elbow is consistent with olecranon bursitis.  Patient reports that he had swelling in this area prior to the fall and he does not feel that the area has changed at all.  There is no overlying erythema or warmth, no drainage, I do not suspect septic bursitis.  Patient was given an Ace wrap to help with compression of his elbow bursitis and his knee injury.  He was instructed to remove these at night so he does not increase his risk for developing a blood clot.  He is able to ambulate with a steady gait, though cane was ordered for him per his request.  I recommend that he also continue to apply cold compresses to the area to help with the swelling.  I recommended that he follow-up with his outpatient provider.  He was also given the information for orthopedics.  Discussed all findings with the patient and his family.  All questions were answered.  Patient was discharged in stable condition.   Patient's presentation is most consistent with acute complicated illness / injury requiring diagnostic workup.    FINAL CLINICAL IMPRESSION(S) / ED DIAGNOSES   Final diagnoses:  Fall in home, initial encounter  Contusion of right knee, initial encounter  Olecranon bursitis of right elbow  Injury of head, initial encounter     Rx / DC Orders   ED Discharge Orders          Ordered    Cane        01/25/23 1156             Note:  This document was prepared using Dragon voice recognition software and may include unintentional dictation errors.   Robert Robertson 01/25/23 1441     Jene Every, MD 01/27/23 [phone]

## 2023-01-25 NOTE — Telephone Encounter (Signed)
FYI  Spoke with pt. Pt states he fell again this morning and is on the way to the Emergency room. Informed pt to give [place] a call back to get him a sooner appt.

## 2023-01-25 NOTE — Telephone Encounter (Signed)
Pt scheduled for f/up 08/01 for 1030

## 2023-01-26 ENCOUNTER — Telehealth: Payer: Self-pay

## 2023-01-26 ENCOUNTER — Encounter (INDEPENDENT_AMBULATORY_CARE_PROVIDER_SITE_OTHER): Payer: Self-pay

## 2023-01-26 NOTE — Telephone Encounter (Signed)
Transition Care Management Unsuccessful Follow-up Telephone Call  Date of discharge and from where:  [facility] 7/23  Attempts:  1st Attempt  Reason for unsuccessful TCM follow-up call:  No answer/busy   Lenard Forth [facility] Guide, [facility] [phone] [address] [place], [place], [place] [address] Phone: [phone] Email: [email or web address]

## 2023-01-27 ENCOUNTER — Encounter: Payer: Self-pay | Admitting: Internal Medicine

## 2023-01-27 ENCOUNTER — Ambulatory Visit (INDEPENDENT_AMBULATORY_CARE_PROVIDER_SITE_OTHER): Payer: Medicare PPO | Admitting: Internal Medicine

## 2023-01-27 VITALS — BP 114/70 | HR 100 | Temp 98.1°F | Ht 68.0 in | Wt 191.0 lb

## 2023-01-27 DIAGNOSIS — S8001XS Contusion of right knee, sequela: Secondary | ICD-10-CM | POA: Diagnosis not present

## 2023-01-27 DIAGNOSIS — S8001XD Contusion of right knee, subsequent encounter: Secondary | ICD-10-CM | POA: Diagnosis not present

## 2023-01-27 DIAGNOSIS — E782 Mixed hyperlipidemia: Secondary | ICD-10-CM | POA: Diagnosis not present

## 2023-01-27 DIAGNOSIS — Z7984 Long term (current) use of oral hypoglycemic drugs: Secondary | ICD-10-CM

## 2023-01-27 DIAGNOSIS — R296 Repeated falls: Secondary | ICD-10-CM | POA: Diagnosis not present

## 2023-01-27 DIAGNOSIS — E538 Deficiency of other specified B group vitamins: Secondary | ICD-10-CM

## 2023-01-27 DIAGNOSIS — S301XXD Contusion of abdominal wall, subsequent encounter: Secondary | ICD-10-CM | POA: Diagnosis not present

## 2023-01-27 DIAGNOSIS — E1169 Type 2 diabetes mellitus with other specified complication: Secondary | ICD-10-CM | POA: Diagnosis not present

## 2023-01-27 DIAGNOSIS — M25421 Effusion, right elbow: Secondary | ICD-10-CM

## 2023-01-27 DIAGNOSIS — I700 Atherosclerosis of aorta: Secondary | ICD-10-CM | POA: Diagnosis not present

## 2023-01-27 DIAGNOSIS — E1121 Type 2 diabetes mellitus with diabetic nephropathy: Secondary | ICD-10-CM

## 2023-01-27 LAB — CBC WITH DIFFERENTIAL/PLATELET
Basophils Absolute: 0 10*3/uL (ref 0.0–0.1)
Basophils Relative: 0.3 % (ref 0.0–3.0)
Eosinophils Absolute: 0.1 10*3/uL (ref 0.0–0.7)
Eosinophils Relative: 1.5 % (ref 0.0–5.0)
HCT: 37.6 % — ABNORMAL LOW (ref 39.0–52.0)
Hemoglobin: 11.9 g/dL — ABNORMAL LOW (ref 13.0–17.0)
Lymphocytes Relative: 16.2 % (ref 12.0–46.0)
Lymphs Abs: 1.2 10*3/uL (ref 0.7–4.0)
MCHC: 31.6 g/dL (ref 30.0–36.0)
MCV: 88.6 fl (ref 78.0–100.0)
Monocytes Absolute: 1 10*3/uL (ref 0.1–1.0)
Monocytes Relative: 13.2 % — ABNORMAL HIGH (ref 3.0–12.0)
Neutro Abs: 5 10*3/uL (ref 1.4–7.7)
Neutrophils Relative %: 68.8 % (ref 43.0–77.0)
Platelets: 215 10*3/uL (ref 150.0–400.0)
RBC: 4.25 Mil/uL (ref 4.22–5.81)
RDW: 13.8 % (ref 11.5–15.5)
WBC: 7.2 10*3/uL (ref 4.0–10.5)

## 2023-01-27 LAB — MICROALBUMIN / CREATININE URINE RATIO
Creatinine,U: 141.3 mg/dL
Microalb Creat Ratio: 0.5 mg/g (ref 0.0–30.0)
Microalb, Ur: <0.7 mg/dL (ref 0.0–1.9)

## 2023-01-27 LAB — HEMOGLOBIN A1C: Hgb A1c MFr Bld: 7 % — ABNORMAL HIGH (ref 4.6–6.5)

## 2023-01-27 LAB — B12 AND FOLATE PANEL
Folate: 22.9 ng/mL (ref >5.9)
Vitamin B-12: 201 pg/mL — ABNORMAL LOW (ref 211–911)

## 2023-01-27 NOTE — Assessment & Plan Note (Addendum)
Managed with diet , metformin and Januvia  .  Cbgs well controlled based on review of glucometer.  Htn and proteinuria, managed with telmisartan .taking a statin .  Lab Results  Component Value Date   MICROALBUR 1.5 01/13/2022   Lab Results  Component Value Date   HGBA1C 7.0 (H) 07/26/2022

## 2023-01-27 NOTE — Patient Instructions (Addendum)
The more time you can apply COLD THERAPY to your swollen knee/elbow/abdomen,  the BETTER  Apply  many times throughout the day,  use ice packs or commercial ice wraps  avoid FROSTBITE with ICE PACKS BY using a thin layer of fabric between the skin and the PACK

## 2023-01-27 NOTE — Progress Notes (Addendum)
Subjective:  Patient ID: Robert Derby., male    DOB: [birth year]  Age: 72 y.o. MRN: [id number]  CC: The primary encounter diagnosis was Aortic atherosclerosis (HCC). Diagnoses of Recurrent falls, DM type 2 with diabetic mixed hyperlipidemia (HCC), Traumatic hematoma of abdominal wall, subsequent encounter, Traumatic hematoma of right knee, subsequent encounter, Effusion of olecranon bursa, right, Diabetic nephropathy associated with type 2 diabetes mellitus (HCC), Traumatic hematoma of knee, right, sequela, and B12 deficiency were also pertinent to this visit.   HPI Robert Robertson. presents for  ER follow ups (2 recent ER visits)  after 2 separate mechanical falls that resulted in trauamtic hematomas onf the right knee,  right elbow and abdominal wall   Chief Complaint  Patient presents with   Hospitalization Follow-up    Pt fell twice, last fall was on 07/30 which resulted in a swollen knee and bruise. Pt has fallen twice within the past 3 weeks     The first fall occurred when he jumped back into his car that had started rolling backward.  He sustained blunt trauma to his abdominal wall  and the occipital region of scalp.  CT abd and CT head were negative for liver/splenic rupture and SDHD , respectively.  Abd wall hematoma was noted.  .  The second fall occurred at home when he tripped over some clutter and landed on to right knee and elbow.  He developed a very large hematoma on his right knee and right elbow.  ER evaluation included CT head and c spine,,  and plain films of elbow and knee.  He was treated and released with an order for a walking cane and advised to wrap the elbow to prevent enlargement of the olecranon effusion.    I reviewed his photographs and note that the knee effusion has improved significantly using ice and compression   Pain started after a hot shower . Using compression garment on knee and icing the   2) T2DM follow up:  currently  not  exercising  regularly tdue to his soft tissue injuries. . Checking  blood sugars once daily at variable times, BS have been under 120  fasting and < 150 post prandially.  Denies any recent hypoglyemic events.  Taking   medications as directed. Following a carbohydrate modified diet 6 days per week. Denies numbness, burning and tingling of extremities. Appetite is good.     Outpatient Medications Prior to Visit  Medication Sig Dispense Refill   aspirin EC 81 MG EC tablet Take 1 tablet (81 mg total) by mouth daily. Swallow whole. 30 tablet 11   atorvastatin (LIPITOR) 80 MG tablet Take 1 tablet (80 mg total) by mouth daily. 90 tablet 3   blood glucose meter kit and supplies USE TO CHECK BLOOD SUGARS UP TO TWO TIMES DAILY 1 each 0   Blood Glucose Monitoring Suppl (FREESTYLE FREEDOM LITE) w/Device KIT 1 application by Does not apply route 2 (two) times daily. 1 kit 0   Ciclopirox (CICLOPIROX TREATMENT) 8 % KIT Apply twice daily to nail bed and cuticle 34.6 mL 1   clopidogrel (PLAVIX) 75 MG tablet Take 1 tablet (75 mg total) by mouth daily. 90 tablet 2   glucose blood (FREESTYLE LITE) test strip USE TO CHECK SUGARS TWICE A DAY 200 each 1   hydrochlorothiazide (HYDRODIURIL) 25 MG tablet Take 1 tablet (25 mg total) by mouth daily. 90 tablet 3   Krill Oil 300 MG CAPS Take 300 mg by mouth  daily.     Lancets (FREESTYLE) lancets USE TO CHECK SUGAR TWICE A DAY 200 each 3   metFORMIN (GLUCOPHAGE) 1000 MG tablet Take 1 tablet (1,000 mg total) by mouth 2 (two) times daily with a meal. 180 tablet 3   Multiple Vitamins-Minerals (CENTRUM SILVER ADULT 50+ PO) Take 1 tablet by mouth daily.     sitaGLIPtin (JANUVIA) 100 MG tablet Take 1 tablet (100 mg total) by mouth daily. 90 tablet 3   Telmisartan-amLODIPine 40-10 MG TABS Take 1 tablet by mouth at bedtime. 90 tablet 3   No facility-administered medications prior to visit.    Review of Systems;  Patient denies headache, fevers, malaise, unintentional weight loss, skin rash,  eye pain, sinus congestion and sinus pain, sore throat, dysphagia,  hemoptysis , cough, dyspnea, wheezing, chest pain, palpitations, orthopnea, edema, abdominal pain, nausea, melena, diarrhea, constipation, flank pain, dysuria, hematuria, urinary  Frequency, nocturia, numbness, tingling, seizures,  Focal weakness, Loss of consciousness,  Tremor, insomnia, depression, anxiety, and suicidal ideation.      Objective:  BP 114/70   Pulse 100   Temp 98.1 F (36.7 C) (Oral)   Ht 5\' 8"  (1.727 m)   Wt 191 lb (86.6 kg)   SpO2 98%   BMI 29.04 kg/m   BP Readings from Last 3 Encounters:  01/27/23 114/70  01/25/23 125/74  01/18/23 120/79    Wt Readings from Last 3 Encounters:  01/27/23 191 lb (86.6 kg)  01/25/23 189 lb 13.1 oz (86.1 kg)  01/18/23 190 lb (86.2 kg)    Physical Exam Vitals reviewed.  Constitutional:      General: He is not in acute distress.    Appearance: Normal appearance. He is normal weight. He is not ill-appearing, toxic-appearing or diaphoretic.  HENT:     Head: Normocephalic.  Eyes:     General: No scleral icterus.       Right eye: No discharge.        Left eye: No discharge.     Conjunctiva/sclera: Conjunctivae normal.  Cardiovascular:     Rate and Rhythm: Normal rate and regular rhythm.     Heart sounds: Normal heart sounds.  Pulmonary:     Effort: Pulmonary effort is normal. No respiratory distress.     Breath sounds: Normal breath sounds.  Musculoskeletal:        General: Swelling and tenderness present. Normal range of motion.     Right elbow: Swelling and effusion present.     Cervical back: Normal range of motion.     Right knee: Swelling, effusion, erythema and ecchymosis present.  Skin:    General: Skin is warm and dry.  Neurological:     General: No focal deficit present.     Mental Status: He is alert and oriented to person, place, and time. Mental status is at baseline.  Psychiatric:        Mood and Affect: Mood normal.        Behavior:  Behavior normal.        Thought Content: Thought content normal.        Judgment: Judgment normal.    Lab Results  Component Value Date   HGBA1C 7.0 (H) 01/27/2023   HGBA1C 7.0 (H) 07/26/2022   HGBA1C 6.9 (H) 04/16/2022    Lab Results  Component Value Date   CREATININE 0.73 01/18/2023   CREATININE 0.84 07/27/2022   CREATININE 0.86 07/26/2022    Lab Results  Component Value Date   WBC 7.2 01/27/2023  HGB 11.9 (L) 01/27/2023   HCT 37.6 (L) 01/27/2023   PLT 215.0 01/27/2023   GLUCOSE 107 (H) 01/18/2023   CHOL 120 01/27/2023   TRIG 103 01/27/2023   HDL 56 01/27/2023   LDLDIRECT 71.0 07/26/2022   LDLCALC 45 01/27/2023   ALT 55 (H) 01/18/2023   AST 48 (H) 01/18/2023   NA 132 (L) 01/18/2023   K 3.6 01/18/2023   CL 99 01/18/2023   CREATININE 0.73 01/18/2023   BUN 11 01/18/2023   CO2 23 01/18/2023   PSA 2.43 09/11/2021   INR 1.0 04/26/2022   HGBA1C 7.0 (H) 01/27/2023   MICROALBUR <0.7 01/27/2023    DG Elbow Complete Right  Result Date: 01/25/2023 CLINICAL DATA:  Pain after fall EXAM: RIGHT ELBOW - COMPLETE 4 VIEW COMPARISON:  None Available. FINDINGS: No fracture or dislocation. Preserved joint spaces and bone mineralization. No joint effusion. Hyperostosis along the olecranon. There is soft tissue swelling about the olecranon posteriorly. Please correlate for etiology including any evidence of bursitis. IMPRESSION: Soft tissue swelling about the olecranon posteriorly. Please correlate for etiology including any evidence of bursitis. Electronically Signed   By: Karen Kays M.D.   On: 01/25/2023 11:22   DG Knee Complete 4 Views Right  Result Date: 01/25/2023 CLINICAL DATA:  Pain after fall EXAM: RIGHT KNEE - COMPLETE 4 VIEW COMPARISON:  12/12/2015 x-ray FINDINGS: On the lateral view there is severe soft tissue swelling anterior to the patella and the quadriceps tendon. No acute fracture or dislocation. Preserved joint spaces and bone mineralization. No joint effusion on  lateral view. Well corticated density once again seen adjacent to the medial condyle. Please correlate for any history of old soft tissue injury including Pellegrini-Stieda IMPRESSION: Soft tissue swelling seen anterior to the patella in the quadriceps tendon. Electronically Signed   By: Karen Kays M.D.   On: 01/25/2023 11:21   CT Cervical Spine Wo Contrast  Result Date: 01/25/2023 CLINICAL DATA:  Fall EXAM: CT HEAD WITHOUT CONTRAST CT CERVICAL SPINE WITHOUT CONTRAST TECHNIQUE: Multidetector CT imaging of the head and cervical spine was performed following the standard protocol without intravenous contrast. Multiplanar CT image reconstructions of the cervical spine were also generated. RADIATION DOSE REDUCTION: This exam was performed according to the departmental dose-optimization program which includes automated exposure control, adjustment of the mA and/or kV according to patient size and/or use of iterative reconstruction technique. COMPARISON:  01/18/2023 FINDINGS: CT HEAD FINDINGS Brain: No evidence of acute infarction, hemorrhage, hydrocephalus, extra-axial collection or mass lesion/mass effect. Unchanged left parieto-occipital encephalomalacia (series 3, image 19). Vascular: No hyperdense vessel or unexpected calcification. Skull: Normal. Negative for fracture or focal lesion. Sinuses/Orbits: No acute finding. Other: None. CT CERVICAL SPINE FINDINGS Alignment: Degenerative straightening and reversal of the normal cervical lordosis. Skull base and vertebrae: No acute fracture. No primary bone lesion or focal pathologic process. Soft tissues and spinal canal: No prevertebral fluid or swelling. No visible canal hematoma. Disc levels: Focally severe disc degenerative disease and ankylosis of C6-C7 otherwise mild disc degenerative change throughout. Upper chest: Negative. Other: None. IMPRESSION: 1. No acute intracranial pathology. Unchanged left parieto-occipital encephalomalacia. 2. No fracture or static  subluxation of the cervical spine. 3. Focally severe disc degenerative disease and ankylosis of C6-C7 with otherwise mild disc degenerative change throughout. Electronically Signed   By: Jearld Lesch M.D.   On: 01/25/2023 10:54   CT Head Wo Contrast  Result Date: 01/25/2023 CLINICAL DATA:  Fall EXAM: CT HEAD WITHOUT CONTRAST CT CERVICAL SPINE WITHOUT  CONTRAST TECHNIQUE: Multidetector CT imaging of the head and cervical spine was performed following the standard protocol without intravenous contrast. Multiplanar CT image reconstructions of the cervical spine were also generated. RADIATION DOSE REDUCTION: This exam was performed according to the departmental dose-optimization program which includes automated exposure control, adjustment of the mA and/or kV according to patient size and/or use of iterative reconstruction technique. COMPARISON:  01/18/2023 FINDINGS: CT HEAD FINDINGS Brain: No evidence of acute infarction, hemorrhage, hydrocephalus, extra-axial collection or mass lesion/mass effect. Unchanged left parieto-occipital encephalomalacia (series 3, image 19). Vascular: No hyperdense vessel or unexpected calcification. Skull: Normal. Negative for fracture or focal lesion. Sinuses/Orbits: No acute finding. Other: None. CT CERVICAL SPINE FINDINGS Alignment: Degenerative straightening and reversal of the normal cervical lordosis. Skull base and vertebrae: No acute fracture. No primary bone lesion or focal pathologic process. Soft tissues and spinal canal: No prevertebral fluid or swelling. No visible canal hematoma. Disc levels: Focally severe disc degenerative disease and ankylosis of C6-C7 otherwise mild disc degenerative change throughout. Upper chest: Negative. Other: None. IMPRESSION: 1. No acute intracranial pathology. Unchanged left parieto-occipital encephalomalacia. 2. No fracture or static subluxation of the cervical spine. 3. Focally severe disc degenerative disease and ankylosis of C6-C7 with  otherwise mild disc degenerative change throughout. Electronically Signed   By: Jearld Lesch M.D.   On: 01/25/2023 10:54    Assessment & Plan:  .Aortic atherosclerosis [facility]) Assessment & Plan: Reviewed findings of recent abdominal  CT scan  done July 23  which noted mild AA .  Patient is tolerating high potency statin therapy     Recurrent falls -     B12 and Folate Panel -     For home use only DME Other see comment  DM type 2 with diabetic mixed hyperlipidemia (HCC) Assessment & Plan: BS are at goal on metformin and  Januvia.  He is tolerating ARB . Statin and  ASA( resumed due to acute CVA October 06 2021) .  Advised to change monitoring to post prandial times .   Lab Results  Component Value Date   HGBA1C 7.0 (H) 01/27/2023   Lab Results  Component Value Date   LABMICR See below: 08/11/2022   LABMICR See below: 08/05/2021   MICROALBUR <0.7 01/27/2023   MICROALBUR 1.5 01/13/2022     Lab Results  Component Value Date   CHOL 120 01/27/2023   HDL 56 01/27/2023   LDLCALC 45 01/27/2023   LDLDIRECT 71.0 07/26/2022   TRIG 103 01/27/2023   CHOLHDL 2.1 01/27/2023     Orders: -     Hemoglobin A1c -     Lipid Panel w/reflex Direct LDL -     Microalbumin / creatinine urine ratio  Traumatic hematoma of abdominal wall, subsequent encounter -     CBC with Differential/Platelet  Traumatic hematoma of right knee, subsequent encounter -     CBC with Differential/Platelet  Effusion of olecranon bursa, right Assessment & Plan: Traumatic,  improving with cold therapy   Orders: -     CBC with Differential/Platelet  Diabetic nephropathy associated with type 2 diabetes mellitus (HCC) Assessment & Plan: Managed with diet , metformin and Januvia  .  Cbgs well controlled based on review of glucometer.  Htn and proteinuria, managed with telmisartan .taking a statin .  Lab Results  Component Value Date   MICROALBUR 1.5 01/13/2022   Lab Results  Component Value Date   HGBA1C  7.0 (H) 07/26/2022      Traumatic hematoma of  knee, right, sequela Assessment & Plan: Large effusion improving with compression and ice.  Patient advised that in the future plavix should be suspended for 48 to 72 hours    B12 deficiency Assessment & Plan: Recommending parenteral therapy and IF Ab /MMA levels.   Orders: -     Intrinsic Factor Antibodies; Future -     Methylmalonic acid, serum; Future     I provided 30 minutes of face-to-face time during this encounter reviewing patient's last visit with me, patient's  most recent visit with cardiology,  nephrology,  and neurology,  recent surgical and non surgical procedures, previous  labs and imaging studies, counseling on currently addressed issues,  and post visit ordering to diagnostics and therapeutics .   Follow-up: Return in about 6 months (around 07/30/2023) for follow up diabetes.   Sherlene Shams, MD

## 2023-01-27 NOTE — Assessment & Plan Note (Signed)
Reviewed findings of recent abdominal  CT scan  done July 23  which noted mild AA .  Patient is tolerating high potency statin therapy

## 2023-01-28 DIAGNOSIS — S8001XS Contusion of right knee, sequela: Secondary | ICD-10-CM | POA: Insufficient documentation

## 2023-01-28 DIAGNOSIS — M25421 Effusion, right elbow: Secondary | ICD-10-CM | POA: Insufficient documentation

## 2023-01-28 DIAGNOSIS — E538 Deficiency of other specified B group vitamins: Secondary | ICD-10-CM | POA: Insufficient documentation

## 2023-01-28 NOTE — Assessment & Plan Note (Signed)
BS are at goal on metformin and  Januvia.  He is tolerating ARB . Statin and  ASA( resumed due to acute CVA October 06 2021) .  Advised to change monitoring to post prandial times .   Lab Results  Component Value Date   HGBA1C 7.0 (H) 01/27/2023   Lab Results  Component Value Date   LABMICR See below: 08/11/2022   LABMICR See below: 08/05/2021   MICROALBUR <0.7 01/27/2023   MICROALBUR 1.5 01/13/2022     Lab Results  Component Value Date   CHOL 120 01/27/2023   HDL 56 01/27/2023   LDLCALC 45 01/27/2023   LDLDIRECT 71.0 07/26/2022   TRIG 103 01/27/2023   CHOLHDL 2.1 01/27/2023

## 2023-01-28 NOTE — Assessment & Plan Note (Signed)
Large effusion improving with compression and ice.  Patient advised that in the future plavix should be suspended for 48 to 72 hours

## 2023-01-28 NOTE — Assessment & Plan Note (Signed)
Traumatic,  improving with cold therapy

## 2023-01-28 NOTE — Addendum Note (Signed)
Addended by: Sherlene Shams on: 01/28/2023 10:00 AM   Modules accepted: Orders

## 2023-01-28 NOTE — Assessment & Plan Note (Signed)
Recommending parenteral therapy and IF Ab /MMA levels.

## 2023-01-31 ENCOUNTER — Telehealth: Payer: Self-pay

## 2023-01-31 NOTE — Telephone Encounter (Signed)
Transition Care Management Unsuccessful Follow-up Telephone Call  Date of discharge and from where:  01/25/2023 [facility]  Attempts:  1st Attempt  Reason for unsuccessful TCM follow-up call:  Left voice message   [facility]  [facility] Population Health Community Resource Care Guide   [email or web address]  ?? [phone]   Website: [email or web address]  [email or web address]

## 2023-02-01 ENCOUNTER — Telehealth: Payer: Self-pay

## 2023-02-01 ENCOUNTER — Ambulatory Visit: Payer: Medicare PPO

## 2023-02-01 NOTE — Telephone Encounter (Signed)
Transition Care Management Follow-up Telephone Call Date of discharge and from where: 01/25/2023 [facility] How have you been since you were released from the hospital? Patient stated he still has pain and swelling but is getting better. Any questions or concerns? No  Items Reviewed: Did the pt receive and understand the discharge instructions provided? Yes  Medications obtained and verified?  No medication prescribed. Other? No  Any new allergies since your discharge? No  Dietary orders reviewed? Yes Do you have support at home? Yes   Follow up appointments reviewed:  PCP Hospital f/u appt confirmed? Yes  Scheduled to see Rosey Bath L. Darrick Huntsman, MD on 01/27/2023 @ [facility] Specialist Hospital f/u appt confirmed? No  Scheduled to see  on  @ . Are transportation arrangements needed? No  If their condition worsens, is the pt aware to call PCP or go to the Emergency Dept.? Yes Was the patient provided with contact information for the PCP's office or ED? Yes Was to pt encouraged to call back with questions or concerns? Yes   [facility]  [facility] Population Health Community Resource Care Guide   [email or web address]  ?? [phone]   Website: [email or web address]  [email or web address]

## 2023-02-02 ENCOUNTER — Ambulatory Visit (INDEPENDENT_AMBULATORY_CARE_PROVIDER_SITE_OTHER): Payer: Medicare PPO | Admitting: Family

## 2023-02-02 ENCOUNTER — Encounter: Payer: Self-pay | Admitting: Family

## 2023-02-02 ENCOUNTER — Ambulatory Visit
Admission: RE | Admit: 2023-02-02 | Discharge: 2023-02-02 | Disposition: A | Discharge status: Home / Self Care | Payer: Medicare PPO | Source: Ambulatory Visit | Attending: Family | Admitting: Family

## 2023-02-02 VITALS — BP 136/80 | HR 87 | Temp 98.6°F | Ht 68.0 in | Wt 190.6 lb

## 2023-02-02 DIAGNOSIS — S8001XS Contusion of right knee, sequela: Secondary | ICD-10-CM | POA: Diagnosis not present

## 2023-02-02 DIAGNOSIS — E538 Deficiency of other specified B group vitamins: Secondary | ICD-10-CM

## 2023-02-02 DIAGNOSIS — M7989 Other specified soft tissue disorders: Secondary | ICD-10-CM | POA: Diagnosis not present

## 2023-02-02 DIAGNOSIS — M79661 Pain in right lower leg: Secondary | ICD-10-CM | POA: Diagnosis not present

## 2023-02-02 MED ORDER — CYANOCOBALAMIN 1000 MCG/ML IJ SOLN
1000.0000 ug | Freq: Once | INTRAMUSCULAR | Status: AC
Start: 2023-02-02 — End: 2023-02-11
  Administered 2023-02-11: 1000 ug via INTRAMUSCULAR

## 2023-02-02 NOTE — Assessment & Plan Note (Addendum)
Concern for possible DVT after traumatic injury, hematoma.  If, I suspect hematoma resolution causing depending edema.   ordered stat US.

## 2023-02-02 NOTE — Progress Notes (Signed)
Assessment & Plan:  Right leg swelling Assessment & Plan: Concern for possible DVT after traumatic injury, hematoma.  If, I suspect hematoma resolution causing depending edema.   ordered stat US.   Orders: -     US Venous Img Lower Unilateral Right (DVT); Future -     Ambulatory referral to Orthopedic Surgery  B12 deficiency Assessment & Plan: B12 given today.  Patient will schedule second B12 injection along with labs.   Orders: -     Cyanocobalamin  Traumatic hematoma of knee, right, sequela Assessment & Plan: Hematoma overall improved from prior with elevation, icing.  He is using Tylenol appropriately.  Avoidance of NSAIDs. Reiterated the importance of use of Ace wrap.  I placed a referral to orthopedics for second opinion in regards to knee pain.   Orders: -     Ambulatory referral to Orthopedic Surgery     Return precautions given.   Risks, benefits, and alternatives of the medications and treatment plan prescribed today were discussed, and patient expressed understanding.   Education regarding symptom management and diagnosis given to patient on AVS either electronically or printed.  No follow-ups on file.  Rennie Plowman, FNP  Subjective:    Patient ID: Robert Robertson., male    DOB: 1951/06/09, 72 y.o.   MRN: [id number]  CC: Robert Robertson. is a 72 y.o. male who presents today for an acute visit.    HPI: Right knee swelling and pain after fall 8 days ago  He describes that he  wasn't paying attention and in a cluttered room when he fell on twin mattress edge on the floor.   He can bend the knee however it is painful, previously all the knee was swollen.  It was not painful.  He is noted now that his calf is swollen as well. Pain started 3 days ago. Taking tylenol 500mg  q8 hrs , with relief  Swelling has improved overall with ice and elevation.  He had been using an Ace wrap 3 days ago  No nsaids.   No fever, sob.   No h/o dvt.   He is  compliant with Plavix   H/o HTN, DM, b12 deficiency Following presents emergency room 01/25/2023. At that time swelling of his right knee and right elbow. CT head and neck obtained negative for acute findings.  Given Ace wrap  X-ray 01/25/2023 right knee soft tissue swelling anterior to the patella and the quadriceps tendon.  B12 201, 6 days ago Crt 0.73  Seen by Dr Darrick Huntsman   Allergies: Pollen extract Current Outpatient Medications on File Prior to Visit  Medication Sig Dispense Refill   aspirin EC 81 MG EC tablet Take 1 tablet (81 mg total) by mouth daily. Swallow whole. 30 tablet 11   atorvastatin (LIPITOR) 80 MG tablet Take 1 tablet (80 mg total) by mouth daily. 90 tablet 3   blood glucose meter kit and supplies USE TO CHECK BLOOD SUGARS UP TO TWO TIMES DAILY 1 each 0   Blood Glucose Monitoring Suppl (FREESTYLE FREEDOM LITE) w/Device KIT 1 application by Does not apply route 2 (two) times daily. 1 kit 0   Ciclopirox (CICLOPIROX TREATMENT) 8 % KIT Apply twice daily to nail bed and cuticle 34.6 mL 1   clopidogrel (PLAVIX) 75 MG tablet Take 1 tablet (75 mg total) by mouth daily. 90 tablet 2   glucose blood (FREESTYLE LITE) test strip USE TO CHECK SUGARS TWICE A DAY 200 each 1   hydrochlorothiazide (  HYDRODIURIL) 25 MG tablet Take 1 tablet (25 mg total) by mouth daily. 90 tablet 3   Krill Oil 300 MG CAPS Take 300 mg by mouth daily.     Lancets (FREESTYLE) lancets USE TO CHECK SUGAR TWICE A DAY 200 each 3   metFORMIN (GLUCOPHAGE) 1000 MG tablet Take 1 tablet (1,000 mg total) by mouth 2 (two) times daily with a meal. 180 tablet 3   Multiple Vitamins-Minerals (CENTRUM SILVER ADULT 50+ PO) Take 1 tablet by mouth daily.     sitaGLIPtin (JANUVIA) 100 MG tablet Take 1 tablet (100 mg total) by mouth daily. 90 tablet 3   Telmisartan-amLODIPine 40-10 MG TABS Take 1 tablet by mouth at bedtime. 90 tablet 3   No current facility-administered medications on file prior to visit.    Review of Systems   Constitutional:  Negative for chills and fever.  Respiratory:  Negative for cough and shortness of breath.   Cardiovascular:  Positive for leg swelling. Negative for chest pain and palpitations.  Gastrointestinal:  Negative for nausea and vomiting.      Objective:    BP 136/80   Pulse 87   Temp 98.6 F (37 C) (Oral)   Ht 5\' 8"  (1.727 m)   Wt 190 lb 9.6 oz (86.5 kg)   SpO2 96%   BMI 28.98 kg/m   BP Readings from Last 3 Encounters:  02/02/23 136/80  01/27/23 114/70  01/25/23 125/74   Wt Readings from Last 3 Encounters:  02/02/23 190 lb 9.6 oz (86.5 kg)  01/27/23 191 lb (86.6 kg)  01/25/23 189 lb 13.1 oz (86.1 kg)    Physical Exam Vitals reviewed.  Constitutional:      Appearance: He is well-developed.  Cardiovascular:     Rate and Rhythm: Regular rhythm.     Heart sounds: Normal heart sounds.     Comments: +1 pedal edema RLE.  No palpable cords or masses. No erythema or increased warmth over lower right leg. Asymmetry in calf size when compared bilaterally R> L.   No discoloration or varicosities noted. LE warm and palpable pedal pulses.  Pulmonary:     Effort: Pulmonary effort is normal. No respiratory distress.     Breath sounds: Normal breath sounds. No wheezing, rhonchi or rales.  Musculoskeletal:     Right lower leg: Edema present.       Legs:     Comments: Hematoma with increased erythema.  Slight tactile warmth appreciated on exam when compared to left knee.  Skin:    General: Skin is warm and dry.  Neurological:     Mental Status: He is alert.  Psychiatric:        Speech: Speech normal.        Behavior: Behavior normal.    If neck

## 2023-02-02 NOTE — Assessment & Plan Note (Signed)
Hematoma overall improved from prior with elevation, icing.  He is using Tylenol appropriately.  Avoidance of NSAIDs. Reiterated the importance of use of Ace wrap.  I placed a referral to orthopedics for second opinion in regards to knee pain.

## 2023-02-02 NOTE — Assessment & Plan Note (Signed)
B12 given today.  Patient will schedule second B12 injection along with labs.

## 2023-02-02 NOTE — Patient Instructions (Signed)
I have ordered a stat ultrasound to rule out deep vein thrombosis.  Please keep your cell phone on you so my call you tonight if it were positive for blood clot.  If it is negative, I will result this in your MyChart Please continue icing, Tylenol.  Please resume Ace wrap  I have placed a referral to orthopedics  Let us know if you dont hear back within a week in regards to an appointment being scheduled.   So that you are aware, if you are [facility] MyChart user , please pay attention to your MyChart messages as you may receive a MyChart message with a phone number to call and schedule this test/appointment own your own from our referral coordinator. This is a new process so I do not want you to miss this message.  If you are not a MyChart user, you will receive a phone call.

## 2023-02-03 ENCOUNTER — Ambulatory Visit (INDEPENDENT_AMBULATORY_CARE_PROVIDER_SITE_OTHER): Payer: Medicare PPO

## 2023-02-03 DIAGNOSIS — I639 Cerebral infarction, unspecified: Secondary | ICD-10-CM | POA: Diagnosis not present

## 2023-02-08 ENCOUNTER — Other Ambulatory Visit: Payer: Self-pay | Admitting: Oncology

## 2023-02-08 ENCOUNTER — Ambulatory Visit: Payer: Medicare PPO

## 2023-02-08 DIAGNOSIS — Z006 Encounter for examination for normal comparison and control in clinical research program: Secondary | ICD-10-CM

## 2023-02-11 ENCOUNTER — Ambulatory Visit (INDEPENDENT_AMBULATORY_CARE_PROVIDER_SITE_OTHER): Payer: Medicare PPO

## 2023-02-11 DIAGNOSIS — E538 Deficiency of other specified B group vitamins: Secondary | ICD-10-CM

## 2023-02-11 NOTE — Progress Notes (Signed)
After obtaining consent, and per orders of Dr. Birdie Sons, injection of b-12 given IM in left deltoid by Valentino Nose. Patient tolerated injection well.

## 2023-02-15 ENCOUNTER — Ambulatory Visit: Payer: Medicare PPO

## 2023-02-15 ENCOUNTER — Ambulatory Visit: Payer: Medicare PPO | Admitting: Internal Medicine

## 2023-02-17 ENCOUNTER — Ambulatory Visit: Payer: Medicare PPO

## 2023-02-17 DIAGNOSIS — E538 Deficiency of other specified B group vitamins: Secondary | ICD-10-CM

## 2023-02-17 MED ORDER — CYANOCOBALAMIN 1000 MCG/ML IJ SOLN
1000.0000 ug | Freq: Once | INTRAMUSCULAR | Status: AC
Start: 2023-02-17 — End: 2023-02-17
  Administered 2023-02-17: 1000 ug via INTRAMUSCULAR

## 2023-02-17 NOTE — Progress Notes (Signed)
Patient arrived for a B12 injection and it was administered into his right deltoid. Patient tolerated the injection well and did not show any signs of distress or voice any concerns.

## 2023-02-18 ENCOUNTER — Other Ambulatory Visit: Payer: Medicare PPO

## 2023-02-22 ENCOUNTER — Ambulatory Visit: Payer: Medicare PPO

## 2023-02-23 ENCOUNTER — Ambulatory Visit (INDEPENDENT_AMBULATORY_CARE_PROVIDER_SITE_OTHER): Payer: Medicare PPO

## 2023-02-23 ENCOUNTER — Ambulatory Visit: Payer: Medicare PPO | Admitting: Internal Medicine

## 2023-02-23 DIAGNOSIS — E538 Deficiency of other specified B group vitamins: Secondary | ICD-10-CM

## 2023-02-23 MED ORDER — CYANOCOBALAMIN 1000 MCG/ML IJ SOLN
1000.0000 ug | Freq: Once | INTRAMUSCULAR | Status: AC
Start: 2023-02-23 — End: 2023-02-23
  Administered 2023-02-23: 1000 ug via INTRAMUSCULAR

## 2023-02-23 NOTE — Progress Notes (Signed)
Patient presented for B 12 injection to left deltoid, patient voiced no concerns nor showed any signs of distress during injection. 

## 2023-02-24 ENCOUNTER — Ambulatory Visit: Payer: Medicare PPO

## 2023-02-24 LAB — METHYLMALONIC ACID, SERUM: Methylmalonic Acid, Quant: 112 nmol/L (ref 69–390)

## 2023-02-24 LAB — INTRINSIC FACTOR ANTIBODIES: Intrinsic Factor: POSITIVE — AB

## 2023-03-02 ENCOUNTER — Telehealth: Payer: Self-pay | Admitting: Internal Medicine

## 2023-03-02 NOTE — Telephone Encounter (Signed)
Pt called in stating that he would like to know what's the frequency of his b-12 shot? Pt stated that he already had his 4 weekly ones, but his not sure what's next? Pt would like a call back for update.

## 2023-03-02 NOTE — Telephone Encounter (Signed)
Lvm to inform pt he should be getting monthly b12 injections now that he has completed the weekly course

## 2023-03-04 NOTE — Telephone Encounter (Signed)
noted 

## 2023-03-04 NOTE — Telephone Encounter (Signed)
Appointment scheduled for b12 inj.

## 2023-03-08 ENCOUNTER — Ambulatory Visit: Payer: Medicare PPO

## 2023-03-08 DIAGNOSIS — I639 Cerebral infarction, unspecified: Secondary | ICD-10-CM

## 2023-03-08 LAB — CUP PACEART REMOTE DEVICE CHECK
Date Time Interrogation Session: 20240909230818
Implantable Pulse Generator Implant Date: 20231004

## 2023-03-15 DIAGNOSIS — S8001XA Contusion of right knee, initial encounter: Secondary | ICD-10-CM | POA: Diagnosis not present

## 2023-03-15 DIAGNOSIS — M1711 Unilateral primary osteoarthritis, right knee: Secondary | ICD-10-CM | POA: Diagnosis not present

## 2023-03-15 DIAGNOSIS — M25561 Pain in right knee: Secondary | ICD-10-CM | POA: Diagnosis not present

## 2023-03-22 NOTE — Progress Notes (Signed)
Carelink Summary Report / Loop Recorder 

## 2023-03-24 NOTE — Progress Notes (Signed)
Carelink Summary Report / Loop Recorder 

## 2023-03-28 ENCOUNTER — Ambulatory Visit (INDEPENDENT_AMBULATORY_CARE_PROVIDER_SITE_OTHER): Payer: Medicare PPO

## 2023-03-28 DIAGNOSIS — E538 Deficiency of other specified B group vitamins: Secondary | ICD-10-CM

## 2023-03-28 MED ORDER — CYANOCOBALAMIN 1000 MCG/ML IJ SOLN
1000.0000 ug | Freq: Once | INTRAMUSCULAR | Status: AC
Start: 2023-03-28 — End: 2023-03-28
  Administered 2023-03-28: 1000 ug via INTRAMUSCULAR

## 2023-03-28 NOTE — Progress Notes (Signed)
Pt presented for their vitamin B12 injection. Pt was identified through two identifiers. Pt tolerated shot well in their right deltoid.  

## 2023-04-06 ENCOUNTER — Encounter: Payer: Self-pay | Admitting: Emergency Medicine

## 2023-04-07 ENCOUNTER — Ambulatory Visit: Payer: Medicare PPO

## 2023-04-07 VITALS — Ht 68.0 in | Wt 190.0 lb

## 2023-04-07 DIAGNOSIS — Z0000 Encounter for general adult medical examination without abnormal findings: Secondary | ICD-10-CM

## 2023-04-07 NOTE — Patient Instructions (Addendum)
Robert Robertson , Thank you for taking time to come for your Medicare Wellness Visit. I appreciate your ongoing commitment to your health goals. Please review the following plan we discussed and let me know if I can assist you in the future.   Referrals/Orders/Follow-Ups/Clinician Recommendations: Get a diabetic foot exam at your next OV with Dr. Darrick Huntsman on 08/01/23. Keep up the good work!  This is a list of the screening recommended for you and due dates:  Health Maintenance  Topic Date Due   Complete foot exam   03/18/2022   COVID-19 Vaccine (7 - 2023-24 season) 02/27/2023   Hemoglobin A1C  07/30/2023   Screening for Lung Cancer  09/07/2023   Eye exam for diabetics  12/13/2023   Yearly kidney function blood test for diabetes  01/18/2024   Yearly kidney health urinalysis for diabetes  01/27/2024   Medicare Annual Wellness Visit  04/06/2024   Colon Cancer Screening  07/17/2024   DTaP/Tdap/Td vaccine (2 - Td or Tdap) 08/07/2024   Pneumonia Vaccine  Completed   Flu Shot  Completed   Hepatitis C Screening  Completed   Zoster (Shingles) Vaccine  Completed   HPV Vaccine  Aged Out    Advanced directives: (ACP Link)Information on Advanced Care Planning can be found at [facility] Advance Health Care Directives Advance Health Care Directives ([email or web address]   Please bring a copy of your health care power of attorney and living will to the office to be added to your chart at your convenience.   Next Medicare Annual Wellness Visit scheduled for next year: Yes, 04/12/24 @ 10:30am  Fall Prevention in the Home, Adult Falls can cause injuries and affect people of all ages. There are many simple things that you can do to make your home safe and to help prevent falls. If you need it, ask for help making these changes. What actions can I take to prevent falls? General information Use good lighting in all rooms. Make sure to: Replace any light bulbs that burn out. Turn on lights if it  is dark and use night-lights. Keep items that you use often in easy-to-reach places. Lower the shelves around your home if needed. Move furniture so that there are clear paths around it. Do not keep throw rugs or other things on the floor that can make you trip. If any of your floors are uneven, fix them. Add color or contrast paint or tape to clearly mark and help you see: Grab bars or handrails. First and last steps of staircases. Where the edge of each step is. If you use a ladder or stepladder: Make sure that it is fully opened. Do not climb a closed ladder. Make sure the sides of the ladder are locked in place. Have someone hold the ladder while you use it. Know where your pets are as you move through your home. What can I do in the bathroom?     Keep the floor dry. Clean up any water that is on the floor right away. Remove soap buildup in the bathtub or shower. Buildup makes bathtubs and showers slippery. Use non-skid mats or decals on the floor of the bathtub or shower. Attach bath mats securely with double-sided, non-slip rug tape. If you need to sit down while you are in the shower, use a non-slip stool. Install grab bars by the toilet and in the bathtub and shower. Do not use towel bars as grab bars. What can I do in the bedroom? Make  sure that you have a light by your bed that is easy to reach. Do not use any sheets or blankets on your bed that hang to the floor. Have a firm bench or chair with side arms that you can use for support when you get dressed. What can I do in the kitchen? Clean up any spills right away. If you need to reach something above you, use a sturdy step stool that has a grab bar. Keep electrical cables out of the way. Do not use floor polish or wax that makes floors slippery. What can I do with my stairs? Do not leave anything on the stairs. Make sure that you have a light switch at the top and the bottom of the stairs. Have them installed if you do  not have them. Make sure that there are handrails on both sides of the stairs. Fix handrails that are broken or loose. Make sure that handrails are as long as the staircases. Install non-slip stair treads on all stairs in your home if they do not have carpet. Avoid having throw rugs at the top or bottom of stairs, or secure the rugs with carpet tape to prevent them from moving. Choose a carpet design that does not hide the edge of steps on the stairs. Make sure that carpet is firmly attached to the stairs. Fix any carpet that is loose or worn. What can I do on the outside of my home? Use bright outdoor lighting. Repair the edges of walkways and driveways and fix any cracks. Clear paths of anything that can make you trip, such as tools or rocks. Add color or contrast paint or tape to clearly mark and help you see high doorway thresholds. Trim any bushes or trees on the main path into your home. Check that handrails are securely fastened and in good repair. Both sides of all steps should have handrails. Install guardrails along the edges of any raised decks or porches. Have leaves, snow, and ice cleared regularly. Use sand, salt, or ice melt on walkways during winter months if you live where there is ice and snow. In the garage, clean up any spills right away, including grease or oil spills. What other actions can I take? Review your medicines with your health care provider. Some medicines can make you confused or feel dizzy. This can increase your chance of falling. Wear closed-toe shoes that fit well and support your feet. Wear shoes that have rubber soles and low heels. Use a cane, walker, scooter, or crutches that help you move around if needed. Talk with your provider about other ways that you can decrease your risk of falls. This may include seeing a physical therapist to learn to do exercises to improve movement and strength. Where to find more information Centers for Disease Control and  Prevention, [employer]AD[employer]: TonerPromos.no [employer] on Aging: BaseRingTones.pl National Institute on Aging: BaseRingTones.pl Contact a health care provider if: You are afraid of falling at home. You feel weak, drowsy, or dizzy at home. You fall at home. Get help right away if you: Lose consciousness or have trouble moving after a fall. Have a fall that causes a head injury. These symptoms may be an emergency. Get help right away. Call 911. Do not wait to see if the symptoms will go away. Do not drive yourself to the hospital. This information is not intended to replace advice given to you by your health care provider. Make sure you discuss any questions you have with your  health care provider. Document Revised: 02/15/2022 Document Reviewed: 02/15/2022 Elsevier Patient Education  2024 [employer].

## 2023-04-07 NOTE — Progress Notes (Signed)
Subjective:   Robert Robertson. is a 72 y.o. male who presents for Medicare Annual/Subsequent preventive examination.  Visit Complete: Virtual I connected with  Robert Robertson. on 04/07/23 by a audio enabled telemedicine application and verified that I am speaking with the correct person using two identifiers.  Patient Location: Home  Provider Location: Home Office  I discussed the limitations of evaluation and management by telemedicine. The patient expressed understanding and agreed to proceed.  Vital Signs: Because this visit was a virtual/telehealth visit, some criteria may be missing or patient reported. Any vitals not documented were not able to be obtained and vitals that have been documented are patient reported.  Patient Medicare AWV questionnaire was completed by the patient on 04/04/23; I have confirmed that all information answered by patient is correct and no changes since this date.  Cardiac Risk Factors include: advanced age (>36men, >48 women);male gender;diabetes mellitus;dyslipidemia;hypertension     Objective:    Today's Vitals   04/07/23 1026  Weight: 190 lb (86.2 kg)  Height: 5\' 8"  (1.727 m)   Body mass index is 28.89 kg/m.     04/07/2023   10:36 AM 01/25/2023    9:58 AM 01/18/2023   11:56 AM 07/27/2022    5:35 PM 04/05/2022   10:20 AM 12/09/2021    7:34 AM 10/08/2021    5:30 PM  Advanced Directives  Does Patient Have a Medical Advance Directive? No No No;Yes No No Yes No  Type of Fish farm manager of [place];Living will  Does patient want to make changes to medical advance directive? Yes (MAU/Ambulatory/Procedural Areas - Information given)        Copy of Healthcare Power of Attorney in Chart?       No - copy requested  Would patient like information on creating a medical advance directive?   No - Patient declined  No - Patient declined  No - Patient declined    Current Medications  (verified) Outpatient Encounter Medications as of 04/07/2023  Medication Sig   aspirin EC 81 MG EC tablet Take 1 tablet (81 mg total) by mouth daily. Swallow whole.   atorvastatin (LIPITOR) 80 MG tablet Take 1 tablet (80 mg total) by mouth daily.   blood glucose meter kit and supplies USE TO CHECK BLOOD SUGARS UP TO TWO TIMES DAILY   Blood Glucose Monitoring Suppl (FREESTYLE FREEDOM LITE) w/Device KIT 1 application by Does not apply route 2 (two) times daily.   Ciclopirox (CICLOPIROX TREATMENT) 8 % KIT Apply twice daily to nail bed and cuticle   clopidogrel (PLAVIX) 75 MG tablet Take 1 tablet (75 mg total) by mouth daily.   glucose blood (FREESTYLE LITE) test strip USE TO CHECK SUGARS TWICE A DAY   hydrochlorothiazide (HYDRODIURIL) 25 MG tablet Take 1 tablet (25 mg total) by mouth daily.   Krill Oil 300 MG CAPS Take 300 mg by mouth daily.   Lancets (FREESTYLE) lancets USE TO CHECK SUGAR TWICE A DAY   metFORMIN (GLUCOPHAGE) 1000 MG tablet Take 1 tablet (1,000 mg total) by mouth 2 (two) times daily with a meal.   Multiple Vitamins-Minerals (CENTRUM SILVER ADULT 50+ PO) Take 1 tablet by mouth daily.   sitaGLIPtin (JANUVIA) 100 MG tablet Take 1 tablet (100 mg total) by mouth daily.   Telmisartan-amLODIPine 40-10 MG TABS Take 1 tablet by mouth at bedtime.   No facility-administered encounter medications on file as of 04/07/2023.  Allergies (verified) Pollen extract   History: Past Medical History:  Diagnosis Date   Allergy    Colon polyp    Diabetes mellitus    Hyperlipidemia    Hypertension    Past Surgical History:  Procedure Laterality Date   COLONOSCOPY  2010   10 mm adenomatous polyp in the descending colon without atypia, Midge Minium, MD   COLONOSCOPY WITH PROPOFOL N/A 07/18/2019   Procedure: COLONOSCOPY WITH PROPOFOL;  Surgeon: Earline Mayotte, MD;  Location: [facility] ENDOSCOPY;  Service: Endoscopy;  Laterality: N/A;   TEE WITHOUT CARDIOVERSION N/A 12/09/2021   Procedure:  TRANSESOPHAGEAL ECHOCARDIOGRAM (TEE);  Surgeon: Debbe Odea, MD;  Location: [facility] ORS;  Service: Cardiovascular;  Laterality: N/A;   Family History  Problem Relation Age of Onset   Diabetes Mother    Stroke Mother    Colon cancer Mother    Cancer Sister        brain tumor   Diabetes Brother    Diabetes Maternal Grandmother    Diabetes Maternal Grandfather    Social History   Socioeconomic History   Marital status: Married    Spouse name: Britta Mccreedy   Number of children: 1   Years of education: Not on file   Highest education level: Not on file  Occupational History   Not on file  Tobacco Use   Smoking status: Former    Current packs/day: 0.00    Average packs/day: 0.5 packs/day for 45.0 years (22.5 ttl pk-yrs)    Types: Cigarettes    Start date: 49    Quit date: 2015    Years since quitting: 9.7   Smokeless tobacco: Never  Vaping Use   Vaping status: Never Used  Substance and Sexual Activity   Alcohol use: Yes    Alcohol/week: 2.0 standard drinks of alcohol    Types: 2 Glasses of wine per week    Comment: 1 glass of wine 2-3 times per week   Drug use: No   Sexual activity: Not Currently  Other Topics Concern   Not on file  Social History Narrative   Not on file   Social Determinants of Health   Financial Resource Strain: Low Risk  (04/04/2023)   Overall Financial Resource Strain (CARDIA)    Difficulty of Paying Living Expenses: Not hard at all  Food Insecurity: No Food Insecurity (04/04/2023)   Hunger Vital Sign    Worried About Running Out of Food in the Last Year: Never true    Ran Out of Food in the Last Year: Never true  Transportation Needs: No Transportation Needs (04/04/2023)   PRAPARE - Administrator, Civil Service (Medical): No    Lack of Transportation (Non-Medical): No  Physical Activity: Inactive (04/04/2023)   Exercise Vital Sign    Days of Exercise per Week: 0 days    Minutes of Exercise per Session: 0 min  Stress: No Stress  Concern Present (04/04/2023)   [employer] of Occupational Health - Occupational Stress Questionnaire    Feeling of Stress : Not at all  Social Connections: Socially Integrated (04/04/2023)   Social Connection and Isolation Panel [NHANES]    Frequency of Communication with Friends and Family: More than three times a week    Frequency of Social Gatherings with Friends and Family: More than three times a week    Attends Religious Services: More than 4 times per year    Active Member of [employer] or Organizations: Yes    Attends Banker Meetings:  1 to 4 times per year    Marital Status: Married    Tobacco Counseling Counseling given: Not Answered   Clinical Intake:  Pre-visit preparation completed: Yes  Pain : No/denies pain     BMI - recorded: 28.89 Nutritional Status: BMI 25 -29 Overweight Nutritional Risks: None Diabetes: Yes CBG done?: Yes (FBS 126 per patient) CBG resulted in Enter/ Edit results?: No Did pt. bring in CBG monitor from home?: No  How often do you need to have someone help you when you read instructions, pamphlets, or other written materials from your doctor or pharmacy?: 1 - Never  Interpreter Needed?: No  Information entered by :: Tora Kindred, CMA   Activities of Daily Living    04/04/2023    9:04 PM  In your present state of health, do you have any difficulty performing the following activities:  Hearing? 0  Vision? 0  Difficulty concentrating or making decisions? 0  Walking or climbing stairs? 0  Dressing or bathing? 0  Doing errands, shopping? 0  Preparing Food and eating ? N  Using the Toilet? N  In the past six months, have you accidently leaked urine? N  Do you have problems with loss of bowel control? N  Managing your Medications? N  Managing your Finances? N  Housekeeping or managing your Housekeeping? N    Patient Care Team: Sherlene Shams, MD as PCP - General (Internal Medicine) Debbe Odea, MD as PCP -  Cardiology (Cardiology) Lanier Prude, MD as PCP - Electrophysiology (Cardiology) Sherlene Shams, MD (Internal Medicine) Lemar Livings Merrily Pew, MD (General Surgery)  Indicate any recent Medical Services you may have received from other than [facility] providers in the past year (date may be approximate).     Assessment:   This is a routine wellness examination for Gaje.  Hearing/Vision screen Hearing Screening - Comments:: Denies hearing loss Vision Screening - Comments:: Gets routine eye exams   Goals Addressed               This Visit's Progress     DIET - INCREASE WATER INTAKE (pt-stated)        Getting back into the gym      Depression Screen    04/07/2023   10:35 AM 02/02/2023    3:48 PM 01/27/2023   10:34 AM 07/28/2022   11:39 AM 04/26/2022   11:07 AM 04/05/2022   10:08 AM 01/18/2022    8:10 AM  PHQ 2/9 Scores  PHQ - 2 Score 0 0 0 0 0 0 0  PHQ- 9 Score 0          Fall Risk    04/04/2023    9:04 PM 02/02/2023    3:48 PM 01/27/2023   10:34 AM 07/28/2022   11:38 AM 04/26/2022   11:07 AM  Fall Risk   Falls in the past year? 1 0 1 0 0  Number falls in past yr: 1 0 1 0   Injury with Fall? 1 0 1 0   Risk for fall due to : History of fall(s);Orthopedic patient No Fall Risks History of fall(s) No Fall Risks No Fall Risks  Follow up Education provided;Falls prevention discussed;Falls evaluation completed Falls evaluation completed Falls evaluation completed Falls evaluation completed Falls evaluation completed    MEDICARE RISK AT HOME: Medicare Risk at Home Any stairs in or around the home?: Yes If so, are there any without handrails?: No Home free of loose throw rugs in walkways, pet beds, electrical  cords, etc?: Yes Adequate lighting in your home to reduce risk of falls?: Yes Life alert?: No Use of a cane, walker or w/c?: No Grab bars in the bathroom?: No Shower chair or bench in shower?: No Elevated toilet seat or a handicapped toilet?: No  TIMED UP AND GO:  Was  the test performed?  No    Cognitive Function:    02/06/2020   10:25 AM 01/26/2018   10:14 AM 12/28/2016    9:27 AM  MMSE - Mini Mental State Exam  Not completed: Unable to complete    Orientation to time  5 5  Orientation to Place  5 5  Registration  3 3  Attention/ Calculation  5 5  Recall  3 3  Language- name 2 objects  2 2  Language- repeat  1 1  Language- follow 3 step command  3 3  Language- read & follow direction  1 1  Write a sentence  1 1  Copy design  1 1  Total score  30 30        04/07/2023   10:39 AM 04/05/2022   10:11 AM 02/05/2019    9:43 AM  6CIT Screen  What Year? 0 points 0 points 0 points  What month? 0 points 0 points 0 points  What time? 0 points 0 points 0 points  Count back from 20 0 points 0 points 0 points  Months in reverse 0 points 0 points 0 points  Repeat phrase 0 points 2 points 0 points  Total Score 0 points 2 points 0 points    Immunizations Immunization History  Administered Date(s) Administered   Fluad Quad(high Dose 65+) 07/03/2019   Hep A / Hep B 05/07/2015, 06/10/2015, 11/04/2015   Influenza Split 04/28/2013   Influenza, High Dose Seasonal PF 02/27/2016, 07/07/2017, 06/08/2018, 03/17/2021   Influenza,inj,Quad PF,6+ Mos 05/08/2014, 05/07/2015   Influenza-Unspecified 03/25/2020, 03/29/2022   Moderna Covid-19 Fall Seasonal Vaccine 20yrs & older 03/29/2022   Moderna Covid-19 Vaccine Bivalent Booster 61yrs & up 03/29/2022   PFIZER Comirnaty(Gray Top)Covid-19 Tri-Sucrose Vaccine 10/17/2020   PFIZER(Purple Top)SARS-COV-2 Vaccination 08/09/2019, 09/03/2019, 03/25/2020, 10/17/2020   Pfizer Covid-19 Vaccine Bivalent Booster 57yrs & up 03/17/2021   Pneumococcal Conjugate-13 08/15/2015   Pneumococcal Polysaccharide-23 05/08/2014, 07/10/2019   Rsv, Bivalent, Protein Subunit Rsvpref,pf Verdis Frederickson) 05/23/2022   Tdap 08/07/2014   Zoster Recombinant(Shingrix) 09/26/2019, 12/05/2019   Zoster, Live 02/27/2016    TDAP status: Up to date  Flu  Vaccine status: Up to date  Pneumococcal vaccine status: Up to date  Covid-19 vaccine status: Information provided on how to obtain vaccines.   Qualifies for Shingles Vaccine? Yes   Zostavax completed No   Shingrix Completed?: Yes  Screening Tests Health Maintenance  Topic Date Due   FOOT EXAM  03/18/2022   COVID-19 Vaccine (7 - 2023-24 season) 02/27/2023   INFLUENZA VACCINE  09/26/2023 (Originally 01/27/2023)   HEMOGLOBIN A1C  07/30/2023   Lung Cancer Screening  09/07/2023   OPHTHALMOLOGY EXAM  12/13/2023   Diabetic kidney evaluation - eGFR measurement  01/18/2024   Diabetic kidney evaluation - Urine ACR  01/27/2024   Medicare Annual Wellness (AWV)  04/06/2024   Colonoscopy  07/17/2024   DTaP/Tdap/Td (2 - Td or Tdap) 08/07/2024   Pneumonia Vaccine 31+ Years old  Completed   Hepatitis C Screening  Completed   Zoster Vaccines- Shingrix  Completed   HPV VACCINES  Aged Out    Health Maintenance  Health Maintenance Due  Topic Date Due  FOOT EXAM  03/18/2022   COVID-19 Vaccine (7 - 2023-24 season) 02/27/2023    Colorectal cancer screening: Type of screening: Colonoscopy. Completed 07/18/19. Repeat every 5 years  Lung Cancer Screening: (Low Dose CT Chest recommended if Age 79-80 years, 20 pack-year currently smoking OR have quit w/in 15years.) does qualify.   Lung Cancer Screening Referral: 09/09/22  Additional Screening:  Hepatitis C Screening: does not qualify; Completed 05/05/15  Vision Screening: Recommended annual ophthalmology exams for early detection of glaucoma and other disorders of the eye. Is the patient up to date with their annual eye exam?  Yes  Who is the provider or what is the name of the office in which the patient attends annual eye exams? Dr. Dellie Burns If pt is not established with a provider, would they like to be referred to a provider to establish care? No .   Dental Screening: Recommended annual dental exams for proper oral hygiene  Diabetic  Foot Exam: Diabetic Foot Exam: Overdue, Pt has been advised about the importance in completing this exam. Pt is scheduled for diabetic foot exam on at next ov 08/01/23.  Community Resource Referral / Chronic Care Management: CRR required this visit?  No   CCM required this visit?  No     Plan:     I have personally reviewed and noted the following in the patient's chart:   Medical and social history Use of alcohol, tobacco or illicit drugs  Current medications and supplements including opioid prescriptions. Patient is not currently taking opioid prescriptions. Functional ability and status Nutritional status Physical activity Advanced directives List of other physicians Hospitalizations, surgeries, and ER visits in previous 12 months Vitals Screenings to include cognitive, depression, and falls Referrals and appointments  In addition, I have reviewed and discussed with patient certain preventive protocols, quality metrics, and best practice recommendations. A written personalized care plan for preventive services as well as general preventive health recommendations were provided to patient.     Tora Kindred, CMA   04/07/2023   After Visit Summary: (MyChart) Due to this being a telephonic visit, the after visit summary with patients personalized plan was offered to patient via MyChart   Nurse Notes:  Needs diabetic foot exam at next OV on 08/01/23

## 2023-04-11 ENCOUNTER — Ambulatory Visit (INDEPENDENT_AMBULATORY_CARE_PROVIDER_SITE_OTHER): Payer: Medicare PPO

## 2023-04-11 DIAGNOSIS — I639 Cerebral infarction, unspecified: Secondary | ICD-10-CM

## 2023-04-11 LAB — CUP PACEART REMOTE DEVICE CHECK
Date Time Interrogation Session: 20241012231033
Implantable Pulse Generator Implant Date: 20231004

## 2023-04-19 NOTE — Progress Notes (Unsigned)
Cardiology Clinic Note   Date: 04/20/2023 ID: [name]., DOB 1950/11/29, MRN [id number]  Primary Cardiologist:  Debbe Odea, MD  Patient Profile    Robert Batson. is a 72 y.o. male who presents to the clinic today for routine follow up.     Past medical history significant for: PFO. TEE 12/09/2021: EF 60 to 65%.  Normal RV function.  No LA/LAA thrombus.  Mild MR.  Evidence of atrial level shunting detected.  Agitated saline contrast bubble study was positive with shunting observed within 3-6 cardiac cycles suggestive of interatrial shunt.  Atrial septum appears aneurysmal with evidence of PFO. Aortic atherosclerosis. Hypertension. Hyperlipidemia. Lipid panel 01/27/2023: LDL 45, HDL 56, TG 103, total 120. Emphysema. T2DM. Past tobacco abuse. CVA. Echo 10/06/2021: EF 60 to 65%.  No RWMA.  Normal diastolic parameters.  Normal RV function.  Mild RVH.  Trivial MR.  Agitated saline contrast bubble study was positive with shunting observed within 3-6 cardiac cycles suggestive of inter-atrial shunt. 14-day ZIO 12/01/2021: Predominant underlying rhythm was sinus.  HR 57 to 194 bpm, average HR 95 bpm.  2 runs of NSVT, fastest/longest lasting 15 beats with max rate 158 bpm, average 126 bpm.  7 runs of SVT with fastest lasting 12 beats with max rate 194 bpm, longest lasting 14 beats with average rate 162 bpm.  No evidence of A-fib or a flutter.  Rare ectopy. Loop recorder implantation 03/31/2022. Remote device check 04/09/2023: Normal device function.  No episodes of A-fib or arrhythmias.  Unchanged from prior device checks.     History of Present Illness    Robert Robertson. is followed by Dr. Azucena Cecil for the above outlined history.  Patient underwent hospital admission on 10/06/2021 due to right arm numbness.  Brain MRI revealed acute left postcentral gyrus stroke.  He was started on aspirin and Plavix.  Echo revealed normal LV/RV function, bubble study positive for  interatrial shunting.  Patient wore a 14-day ZIO in June 2023 which showed paroxysmal SVT and 2 runs of NSVT, no evidence of A-fib/a-flutter.  He was evaluated by Dr. Azucena Cecil on 12/07/2021 and was scheduled for TEE which confirmed interatrial shunting with aneurysmal atrial septum and evidence of PFO.  Patient was referred for evaluation by structural heart.  He was seen by Dr. Lynnette Caffey on 12/21/2021 and it was felt PFO was less likely implicated in patient's stroke.  It was suggested he undergo loop implantation.   Discussed the use of AI scribe software for clinical note transcription with the patient, who gave verbal consent to proceed.  The patient presents for a routine follow-up. He denies chest pain, shortness of breath, and palpitations. He has some numbness in bilateral lower extremities secondary to neuropathy but no lower extremity edema.  He has a loop recorder implanted for monitoring of arrhythmias, with monthly checks showing no issues. He denies any deficits from the previous stroke.  The patient has been inactive for about three months due to a knee injury sustained from a mechanical fall x 2. Prior to this, he was regularly exercising at the gym, alternating between upper body and leg workouts, and walking for about 40 minutes. He plans to return to the gym in about a week.       ROS: All other systems reviewed and are otherwise negative except as noted in History of Present Illness.  Studies Reviewed    EKG Interpretation Date/Time:  Wednesday April 20 2023 08:48:13 EDT Ventricular Rate:  91  PR Interval:  152 QRS Duration:  74 QT Interval:  362 QTC Calculation: 445 R Axis:   18  Text Interpretation: Normal sinus rhythm Normal ECG When compared with ECG of 27-Jul-2022 17:09, No significant change was found Confirmed by Carlos Levering [phone]) on 04/20/2023 8:53:19 AM           Physical Exam    VS:  BP 120/70 (BP Location: Left Arm, Patient Position: Sitting,  Cuff Size: Normal)   Pulse 91   Ht 5\' 8"  (1.727 m)   Wt 190 lb 8 oz (86.4 kg)   SpO2 98%   BMI 28.97 kg/m  , BMI Body mass index is 28.97 kg/m.  GEN: Well nourished, well developed, in no acute distress. Neck: No JVD or carotid bruits. Cardiac:  RRR. No murmurs. No rubs or gallops.   Respiratory:  Respirations regular and unlabored. Clear to auscultation without rales, wheezing or rhonchi. GI: Soft, nontender, nondistended. Extremities: Radials/DP/PT 2+ and equal bilaterally. No clubbing or cyanosis. No edema.  Skin: Warm and dry, no rash. Neuro: Strength intact.  Assessment & Plan      PFO TEE June 2023 showed normal LV/RV function, bubble study was positive with shunting observed in 3-6 cardiac cycles suggestive of interatrial shunt, atrial septum appears aneurysmal with evidence of PFO.  Patient evaluated by structural heart who felt PFO was not the cause of stroke.  Hypertension BP today 120/70.  -Continue telmisartan/amlodipine. -Increase physical activity as tolerated. Plan to return to gym in 1 week. Previously stopped going to gym for 3 months secondary to knee injury from mechanical fall.   Hyperlipidemia/Aortic arthrosclerosis LDL August 2024 45, at goal. -Continue atorvastatin.  CVA Occurred April 2023.  Echo showed PFO confirmed by TEE.  Structural heart felt PFO did not cause stroke.  Patient had loop recorder implanted October 2023.  All remote device checks have not revealed A-fib or other arrhythmias.  Patient denies deficits from stroke.       Disposition: Return in 6 months or sooner as needed.          Signed, Etta Grandchild. Marvion Bastidas, DNP, NP-C

## 2023-04-20 ENCOUNTER — Ambulatory Visit: Payer: Medicare PPO | Attending: Student | Admitting: Student

## 2023-04-20 ENCOUNTER — Encounter: Payer: Self-pay | Admitting: Student

## 2023-04-20 VITALS — BP 120/70 | HR 91 | Ht 68.0 in | Wt 190.5 lb

## 2023-04-20 DIAGNOSIS — I10 Essential (primary) hypertension: Secondary | ICD-10-CM | POA: Diagnosis not present

## 2023-04-20 DIAGNOSIS — Q2112 Patent foramen ovale: Secondary | ICD-10-CM | POA: Diagnosis not present

## 2023-04-20 DIAGNOSIS — I700 Atherosclerosis of aorta: Secondary | ICD-10-CM

## 2023-04-20 DIAGNOSIS — Z8673 Personal history of transient ischemic attack (TIA), and cerebral infarction without residual deficits: Secondary | ICD-10-CM

## 2023-04-20 DIAGNOSIS — E785 Hyperlipidemia, unspecified: Secondary | ICD-10-CM

## 2023-04-20 NOTE — Patient Instructions (Signed)
Medication Instructions:  Your physician recommends that you continue on your current medications as directed. Please refer to the Current Medication list given to you today.   *If you need a refill on your cardiac medications before your next appointment, please call your pharmacy*   Lab Work: No labs ordered today    Testing/Procedures: No test ordered today    Follow-Up: At [facility], you and your health needs are our priority.  As part of our continuing mission to provide you with exceptional heart care, we have created designated Provider Care Teams.  These Care Teams include your primary Cardiologist (physician) and Advanced Practice Providers (APPs -  Physician Assistants and Nurse Practitioners) who all work together to provide you with the care you need, when you need it.  We recommend signing up for the patient portal called "MyChart".  Sign up information is provided on this After Visit Summary.  MyChart is used to connect with patients for Virtual Visits (Telemedicine).  Patients are able to view lab/test results, encounter notes, upcoming appointments, etc.  Non-urgent messages can be sent to your provider as well.   To learn more about what you can do with MyChart, go to [email or web address].    Your next appointment:   6 month(s)  Provider:   You may see Debbe Odea, MD or one of the following Advanced Practice Providers on your designated Care Team:   Nicolasa Ducking, NP Eula Listen, PA-C Cadence Fransico Michael, PA-C Charlsie Quest, NP

## 2023-04-25 NOTE — Progress Notes (Signed)
Carelink Summary Report / Loop Recorder 

## 2023-04-27 ENCOUNTER — Ambulatory Visit: Payer: Medicare PPO

## 2023-05-02 ENCOUNTER — Ambulatory Visit (INDEPENDENT_AMBULATORY_CARE_PROVIDER_SITE_OTHER): Payer: Medicare PPO

## 2023-05-02 DIAGNOSIS — E538 Deficiency of other specified B group vitamins: Secondary | ICD-10-CM

## 2023-05-02 MED ORDER — CYANOCOBALAMIN 1000 MCG/ML IJ SOLN
1000.0000 ug | Freq: Once | INTRAMUSCULAR | Status: AC
Start: 2023-05-02 — End: 2023-05-02
  Administered 2023-05-02: 1000 ug via INTRAMUSCULAR

## 2023-05-02 NOTE — Progress Notes (Signed)
Pt presented for their vitamin B12 injection. Pt was identified through two identifiers. Pt tolerated shot well in their left  deltoid.  

## 2023-05-04 ENCOUNTER — Other Ambulatory Visit
Admission: RE | Admit: 2023-05-04 | Discharge: 2023-05-04 | Disposition: A | Discharge status: Home / Self Care | Payer: Medicare PPO | Source: Ambulatory Visit | Attending: Oncology | Admitting: Oncology

## 2023-05-04 DIAGNOSIS — Z006 Encounter for examination for normal comparison and control in clinical research program: Secondary | ICD-10-CM | POA: Insufficient documentation

## 2023-05-13 LAB — GENECONNECT MOLECULAR SCREEN

## 2023-05-13 LAB — HELIX MOLECULAR SCREEN: Genetic Analysis Overall Interpretation: NEGATIVE

## 2023-05-16 ENCOUNTER — Ambulatory Visit (INDEPENDENT_AMBULATORY_CARE_PROVIDER_SITE_OTHER): Payer: Medicare PPO

## 2023-05-16 DIAGNOSIS — I639 Cerebral infarction, unspecified: Secondary | ICD-10-CM | POA: Diagnosis not present

## 2023-05-16 LAB — CUP PACEART REMOTE DEVICE CHECK
Date Time Interrogation Session: 20241117232841
Implantable Pulse Generator Implant Date: 20231004

## 2023-06-01 ENCOUNTER — Ambulatory Visit: Payer: Medicare PPO

## 2023-06-08 NOTE — Progress Notes (Signed)
Carelink Summary Report / Loop Recorder 

## 2023-06-20 ENCOUNTER — Ambulatory Visit (INDEPENDENT_AMBULATORY_CARE_PROVIDER_SITE_OTHER): Payer: Medicare PPO

## 2023-06-20 DIAGNOSIS — I639 Cerebral infarction, unspecified: Secondary | ICD-10-CM

## 2023-06-20 LAB — CUP PACEART REMOTE DEVICE CHECK
Date Time Interrogation Session: 20241222231634
Implantable Pulse Generator Implant Date: 20231004

## 2023-06-27 DIAGNOSIS — H2513 Age-related nuclear cataract, bilateral: Secondary | ICD-10-CM | POA: Diagnosis not present

## 2023-06-27 LAB — HM DIABETES EYE EXAM

## 2023-07-01 ENCOUNTER — Other Ambulatory Visit (INDEPENDENT_AMBULATORY_CARE_PROVIDER_SITE_OTHER): Payer: Medicare PPO

## 2023-07-01 DIAGNOSIS — E1169 Type 2 diabetes mellitus with other specified complication: Secondary | ICD-10-CM | POA: Diagnosis not present

## 2023-07-01 DIAGNOSIS — I10 Essential (primary) hypertension: Secondary | ICD-10-CM

## 2023-07-01 DIAGNOSIS — E7800 Pure hypercholesterolemia, unspecified: Secondary | ICD-10-CM | POA: Diagnosis not present

## 2023-07-01 DIAGNOSIS — E782 Mixed hyperlipidemia: Secondary | ICD-10-CM

## 2023-07-01 LAB — LIPID PANEL
Cholesterol: 145 mg/dL (ref 0–200)
HDL: 64.7 mg/dL (ref >39.00)
LDL Cholesterol: 72 mg/dL (ref 0–99)
NonHDL: 80.66
Total CHOL/HDL Ratio: 2
Triglycerides: 42 mg/dL (ref 0.0–149.0)
VLDL: 8.4 mg/dL (ref 0.0–40.0)

## 2023-07-01 LAB — COMPREHENSIVE METABOLIC PANEL
ALT: 42 U/L (ref 0–53)
AST: 31 U/L (ref 0–37)
Albumin: 4.7 g/dL (ref 3.5–5.2)
Alkaline Phosphatase: 60 U/L (ref 39–117)
BUN: 10 mg/dL (ref 6–23)
CO2: 29 meq/L (ref 19–32)
Calcium: 9.6 mg/dL (ref 8.4–10.5)
Chloride: 99 meq/L (ref 96–112)
Creatinine, Ser: 0.76 mg/dL (ref 0.40–1.50)
GFR: 89.72 mL/min (ref >60.00)
Glucose, Bld: 143 mg/dL — ABNORMAL HIGH (ref 70–99)
Potassium: 4.4 meq/L (ref 3.5–5.1)
Sodium: 137 meq/L (ref 135–145)
Total Bilirubin: 0.8 mg/dL (ref 0.2–1.2)
Total Protein: 7.2 g/dL (ref 6.0–8.3)

## 2023-07-01 LAB — LDL CHOLESTEROL, DIRECT: Direct LDL: 72 mg/dL

## 2023-07-04 ENCOUNTER — Ambulatory Visit (INDEPENDENT_AMBULATORY_CARE_PROVIDER_SITE_OTHER): Payer: Medicare PPO | Admitting: Internal Medicine

## 2023-07-04 ENCOUNTER — Encounter: Payer: Self-pay | Admitting: Internal Medicine

## 2023-07-04 ENCOUNTER — Telehealth: Payer: Self-pay | Admitting: Internal Medicine

## 2023-07-04 VITALS — BP 138/78 | HR 92 | Ht 68.0 in | Wt 189.6 lb

## 2023-07-04 DIAGNOSIS — E1169 Type 2 diabetes mellitus with other specified complication: Secondary | ICD-10-CM

## 2023-07-04 DIAGNOSIS — Q2112 Patent foramen ovale: Secondary | ICD-10-CM | POA: Diagnosis not present

## 2023-07-04 DIAGNOSIS — R972 Elevated prostate specific antigen [PSA]: Secondary | ICD-10-CM | POA: Diagnosis not present

## 2023-07-04 DIAGNOSIS — K760 Fatty (change of) liver, not elsewhere classified: Secondary | ICD-10-CM

## 2023-07-04 DIAGNOSIS — Z8673 Personal history of transient ischemic attack (TIA), and cerebral infarction without residual deficits: Secondary | ICD-10-CM | POA: Diagnosis not present

## 2023-07-04 DIAGNOSIS — E538 Deficiency of other specified B group vitamins: Secondary | ICD-10-CM

## 2023-07-04 DIAGNOSIS — E7800 Pure hypercholesterolemia, unspecified: Secondary | ICD-10-CM

## 2023-07-04 DIAGNOSIS — E782 Mixed hyperlipidemia: Secondary | ICD-10-CM | POA: Diagnosis not present

## 2023-07-04 DIAGNOSIS — I10 Essential (primary) hypertension: Secondary | ICD-10-CM

## 2023-07-04 LAB — POCT GLYCOSYLATED HEMOGLOBIN (HGB A1C): Hemoglobin A1C: 7 % — AB (ref 4.0–5.6)

## 2023-07-04 MED ORDER — SITAGLIPTIN PHOSPHATE 100 MG PO TABS: 100.0000 mg | ORAL_TABLET | Freq: Every day | ORAL | 3 refills | Status: DC

## 2023-07-04 MED ORDER — CYANOCOBALAMIN 1000 MCG/ML IJ SOLN
1000.0000 ug | Freq: Once | INTRAMUSCULAR | Status: AC
Start: 2023-07-04 — End: 2023-07-04
  Administered 2023-07-04: 1000 ug via INTRAMUSCULAR

## 2023-07-04 NOTE — Patient Instructions (Addendum)
 Your diabetes remains under excellent control  And your cholesterol and other labs are also normal OR AT GOAL.   Please continue your current medications.   return  montly for b12 injection.     Return in 6 months for follow up on diabetes and make sure you are seeing your eye doctor at least once a year for a dilated retina exam to monitor for diabetic retinopathy,. changes that can lead to blindness .   If you need a microwave dinner   choose  [employer] Choice low carb power bowl  entrees and  Steamer entrees; they are nutritious  low carb entrees that microwave in 5 minutes

## 2023-07-04 NOTE — Progress Notes (Signed)
 Subjective:  Patient ID: Robert Victory Cosme Mickey., male    DOB: 05/24/1951  Age: 73 y.o. MRN: [id number]  CC: The primary encounter diagnosis was DM type 2 with diabetic mixed hyperlipidemia (HCC). Diagnoses of B12 deficiency, History of CVA (cerebrovascular accident) without residual deficits, PSA elevation, PFO (patent foramen ovale), Pure hypercholesterolemia, and Hepatic steatosis were also pertinent to this visit.   HPI Robert Victory Cosme Mickey. presents for  Chief Complaint  Patient presents with   Medical Management of Chronic Issues    6 month follow up     1) type 2 DM:  patient reports that he is tolerating his current regimen of januvia  once daily and metformin  bid .  OOP Januvia  cost is  $38/90 DAYS .  Also tolerating lipitor  and ARB . He has noticed some elevated readings over th epast 8 weeks due to altered eating habits.  His wife suffered a traumatic intrcranial bleed and was transferred to skilled nursing after undergoing therapeutic craniotomy in November   2) B12 deficiency with IF ab positive.  He prefers to have office administer monthly injections and is due today.  He has a mild neuropathy presenting as decreased sensation bilaterally in the feet  which is attributed to his diabetes   3) onychomycosis;  using ciclopirox   given need to avoid oral antifungals and he denies cuticle inflammation.  He is starting to see a result  4 HTN:  Hypertension: patient checks blood pressure twice weekly at home.  Readings have been for the most part <130/80 at rest . Patient is following a reduced salt diet most days and is taking medications as prescribed   Outpatient Medications Prior to Visit  Medication Sig Dispense Refill   aspirin  EC 81 MG EC tablet Take 1 tablet (81 mg total) by mouth daily. Swallow whole. 30 tablet 11   blood glucose meter kit and supplies USE TO CHECK BLOOD SUGARS UP TO TWO TIMES DAILY 1 each 0   Blood Glucose Monitoring Suppl (FREESTYLE FREEDOM LITE)  w/Device KIT 1 application by Does not apply route 2 (two) times daily. 1 kit 0   Ciclopirox  (CICLOPIROX  TREATMENT) 8 % KIT Apply twice daily to nail bed and cuticle 34.6 mL 1   clopidogrel  (PLAVIX ) 75 MG tablet Take 1 tablet (75 mg total) by mouth daily. 90 tablet 2   glucose blood (FREESTYLE LITE) test strip USE TO CHECK SUGARS TWICE A DAY 200 each 1   hydrochlorothiazide  (HYDRODIURIL ) 25 MG tablet Take 1 tablet (25 mg total) by mouth daily. 90 tablet 3   Krill Oil 300 MG CAPS Take 300 mg by mouth daily.     Lancets (FREESTYLE) lancets USE TO CHECK SUGAR TWICE A DAY 200 each 3   metFORMIN  (GLUCOPHAGE ) 1000 MG tablet Take 1 tablet (1,000 mg total) by mouth 2 (two) times daily with a meal. 180 tablet 3   Multiple Vitamins-Minerals (CENTRUM SILVER ADULT 50+ PO) Take 1 tablet by mouth daily.     Telmisartan -amLODIPine  40-10 MG TABS Take 1 tablet by mouth at bedtime. 90 tablet 3   atorvastatin  (LIPITOR) 80 MG tablet Take 1 tablet (80 mg total) by mouth daily. 90 tablet 3   sitaGLIPtin  (JANUVIA ) 100 MG tablet Take 1 tablet (100 mg total) by mouth daily. 90 tablet 3   No facility-administered medications prior to visit.    Review of Systems;  Patient denies headache, fevers, malaise, unintentional weight loss, skin rash, eye pain, sinus congestion and sinus pain, sore throat,  dysphagia,  hemoptysis , cough, dyspnea, wheezing, chest pain, palpitations, orthopnea, edema, abdominal pain, nausea, melena, diarrhea, constipation, flank pain, dysuria, hematuria, urinary  Frequency, nocturia, numbness, tingling, seizures,  Focal weakness, Loss of consciousness,  Tremor, insomnia, depression, anxiety, and suicidal ideation.      Objective:  BP 138/78   Pulse 92   Ht 5' 8 (1.727 m)   Wt 189 lb 9.6 oz (86 kg)   SpO2 98%   BMI 28.83 kg/m   BP Readings from Last 3 Encounters:  07/04/23 138/78  04/20/23 120/70  02/02/23 136/80    Wt Readings from Last 3 Encounters:  07/04/23 189 lb 9.6 oz (86 kg)   04/20/23 190 lb 8 oz (86.4 kg)  04/07/23 190 lb (86.2 kg)    Physical Exam Vitals reviewed.  Constitutional:      General: He is not in acute distress.    Appearance: Normal appearance. He is normal weight. He is not ill-appearing, toxic-appearing or diaphoretic.  HENT:     Head: Normocephalic.  Eyes:     General: No scleral icterus.       Right eye: No discharge.        Left eye: No discharge.     Conjunctiva/sclera: Conjunctivae normal.  Cardiovascular:     Rate and Rhythm: Normal rate and regular rhythm.     Heart sounds: Normal heart sounds.  Pulmonary:     Effort: Pulmonary effort is normal. No respiratory distress.     Breath sounds: Normal breath sounds.  Musculoskeletal:        General: Normal range of motion.     Cervical back: Normal range of motion.  Skin:    General: Skin is warm and dry.  Neurological:     General: No focal deficit present.     Mental Status: He is alert and oriented to person, place, and time. Mental status is at baseline.  Psychiatric:        Mood and Affect: Mood normal.        Behavior: Behavior normal.        Thought Content: Thought content normal.        Judgment: Judgment normal.     Lab Results  Component Value Date   HGBA1C 7.0 (A) 07/04/2023   HGBA1C 7.0 (H) 01/27/2023   HGBA1C 7.0 (H) 07/26/2022    Lab Results  Component Value Date   CREATININE 0.76 07/01/2023   CREATININE 0.73 01/18/2023   CREATININE 0.84 07/27/2022    Lab Results  Component Value Date   WBC 7.2 01/27/2023   HGB 11.9 (L) 01/27/2023   HCT 37.6 (L) 01/27/2023   PLT 215.0 01/27/2023   GLUCOSE 143 (H) 07/01/2023   CHOL 145 07/01/2023   TRIG 42.0 07/01/2023   HDL 64.70 07/01/2023   LDLDIRECT 72.0 07/01/2023   LDLCALC 72 07/01/2023   ALT 42 07/01/2023   AST 31 07/01/2023   NA 137 07/01/2023   K 4.4 07/01/2023   CL 99 07/01/2023   CREATININE 0.76 07/01/2023   BUN 10 07/01/2023   CO2 29 07/01/2023   PSA 2.43 09/11/2021   INR 1.0 04/26/2022    HGBA1C 7.0 (A) 07/04/2023   MICROALBUR <0.7 01/27/2023    No results found.  Assessment & Plan:  .DM type 2 with diabetic mixed hyperlipidemia (HCC) Assessment & Plan: BS are at goal on metformin  and  Januvia .  He is tolerating ARB . Statin and  ASA( resumed due to acute CVA October 06 2021) .  Advised to change  statin to rosuvastatin  for goal LDL < 55 Lab Results  Component Value Date   HGBA1C 7.0 (A) 07/04/2023   Lab Results  Component Value Date   LABMICR See below: 08/11/2022   LABMICR See below: 08/05/2021   MICROALBUR <0.7 01/27/2023   MICROALBUR 1.5 01/13/2022     Lab Results  Component Value Date   CHOL 145 07/01/2023   HDL 64.70 07/01/2023   LDLCALC 72 07/01/2023   LDLDIRECT 72.0 07/01/2023   TRIG 42.0 07/01/2023   CHOLHDL 2 07/01/2023     Orders: -     POCT glycosylated hemoglobin (Hb A1C)  B12 deficiency -     Cyanocobalamin   History of CVA (cerebrovascular accident) without residual deficits Assessment & Plan: Considered Cryptogenic by cardiology . 2023 ECHO noted  a PFO which is considered non contributor. SABRA  He complete a 3 month course of  asa and plavix   and has continued asa and continue his high dose statin .  He has good BP control and his diabetes is controlled.  He is now followed by  neurology and cardiology    PSA elevation Assessment & Plan: Attributed to BPH with LUTS by Urology in Jan 2021.  Continue annual follow up with  Stoioff .   Lab Results  Component Value Date   PSA1 3.4 08/11/2022   PSA1 2.7 08/05/2021   PSA1 2.7 08/04/2020   PSA 2.43 09/11/2021   PSA 2.48 07/03/2019   PSA 2.00 05/14/2014       PFO (patent foramen ovale) Assessment & Plan: Found in 2023 during workup for CVA.  PFO is not considered by cardiology to be causative.    Pure hypercholesterolemia Assessment & Plan: Goals is < 55 due to history of CVA.SABRA  HE IS ON MAX DOSE OF LIPITOR. Will recommend trial of max dose crestor  and if not at goal will  recommend repatha   Lab Results  Component Value Date   CHOL 145 07/01/2023   HDL 64.70 07/01/2023   LDLCALC 72 07/01/2023   LDLDIRECT 72.0 07/01/2023   TRIG 42.0 07/01/2023   CHOLHDL 2 07/01/2023     Orders: -     Lipid Panel w/reflex Direct LDL; Future  Hepatic steatosis Assessment & Plan: Reminded to abstain from daily intake of alcohol. LFTs normal   Lab Results  Component Value Date   ALT 42 07/01/2023   AST 31 07/01/2023   ALKPHOS 60 07/01/2023   BILITOT 0.8 07/01/2023      Other orders -     SITagliptin  Phosphate; Take 1 tablet (100 mg total) by mouth daily.  Dispense: 90 tablet; Refill: 3 -     Rosuvastatin  Calcium ; Take 1 tablet (40 mg total) by mouth daily.  Dispense: 90 tablet; Refill: 0     I spent  30 minutes of fthe day of this face-to-face  encounter reviewing patient's last visit with me, patient's  most recent visit with cardiology  and neurology,  recent surgical and non surgical procedures, previous  labs and imaging studies, counseling on currently addressed issues,  and post visit ordering to diagnostics and therapeutics .   Follow-up: Return in about 6 months (around 01/01/2024) for follow up diabetes.   Verneita LITTIE Kettering, MD

## 2023-07-04 NOTE — Telephone Encounter (Signed)
 Patient was seen today by Dr Darrick Huntsman. Patient wanted to know if Dr Darrick Huntsman wanted to do labs before his 61m appt in July.

## 2023-07-05 ENCOUNTER — Encounter: Payer: Self-pay | Admitting: Internal Medicine

## 2023-07-05 MED ORDER — ROSUVASTATIN CALCIUM 40 MG PO TABS: 40.0000 mg | ORAL_TABLET | Freq: Every day | ORAL | 0 refills | Status: DC

## 2023-07-05 NOTE — Assessment & Plan Note (Signed)
 Reminded to abstain from daily intake of alcohol. LFTs normal   Lab Results  Component Value Date   ALT 42 07/01/2023   AST 31 07/01/2023   ALKPHOS 60 07/01/2023   BILITOT 0.8 07/01/2023

## 2023-07-05 NOTE — Assessment & Plan Note (Signed)
 Found in 2023 during workup for CVA.  PFO is not considered by cardiology to be causative.

## 2023-07-05 NOTE — Assessment & Plan Note (Signed)
 Attributed to BPH with LUTS by Urology in Jan 2021.  Continue annual follow up with  Stoioff .   Lab Results  Component Value Date   PSA1 3.4 08/11/2022   PSA1 2.7 08/05/2021   PSA1 2.7 08/04/2020   PSA 2.43 09/11/2021   PSA 2.48 07/03/2019   PSA 2.00 05/14/2014

## 2023-07-05 NOTE — Assessment & Plan Note (Signed)
 BS are at goal on metformin  and  Januvia .  He is tolerating ARB . Statin and  ASA( resumed due to acute CVA October 06 2021) .  Advised to change  statin to rosuvastatin  for goal LDL < 55 Lab Results  Component Value Date   HGBA1C 7.0 (A) 07/04/2023   Lab Results  Component Value Date   LABMICR See below: 08/11/2022   LABMICR See below: 08/05/2021   MICROALBUR <0.7 01/27/2023   MICROALBUR 1.5 01/13/2022     Lab Results  Component Value Date   CHOL 145 07/01/2023   HDL 64.70 07/01/2023   LDLCALC 72 07/01/2023   LDLDIRECT 72.0 07/01/2023   TRIG 42.0 07/01/2023   CHOLHDL 2 07/01/2023

## 2023-07-05 NOTE — Assessment & Plan Note (Signed)
 Considered Cryptogenic by cardiology . 2023 ECHO noted  a PFO which is considered non contributor. SABRA  He complete a 3 month course of  asa and plavix   and has continued asa and continue his high dose statin .  He has good BP control and his diabetes is controlled.  He is now followed by  neurology and cardiology

## 2023-07-05 NOTE — Assessment & Plan Note (Signed)
 Goals is < 55 due to history of CVA.SABRA  HE IS ON MAX DOSE OF LIPITOR. Will recommend trial of max dose crestor  and if not at goal will recommend repatha   Lab Results  Component Value Date   CHOL 145 07/01/2023   HDL 64.70 07/01/2023   LDLCALC 72 07/01/2023   LDLDIRECT 72.0 07/01/2023   TRIG 42.0 07/01/2023   CHOLHDL 2 07/01/2023

## 2023-07-08 NOTE — Addendum Note (Signed)
 Addended by: Sandy Salaam on: 07/08/2023 01:08 PM   Modules accepted: Orders

## 2023-07-08 NOTE — Addendum Note (Signed)
 Addended by: Sherlene Shams on: 07/08/2023 02:41 PM   Modules accepted: Orders

## 2023-07-08 NOTE — Telephone Encounter (Signed)
 I have pended labs for your approval.

## 2023-07-25 ENCOUNTER — Ambulatory Visit: Payer: Medicare PPO

## 2023-07-25 DIAGNOSIS — I639 Cerebral infarction, unspecified: Secondary | ICD-10-CM | POA: Diagnosis not present

## 2023-07-25 DIAGNOSIS — K7401 Hepatic fibrosis, early fibrosis: Secondary | ICD-10-CM | POA: Diagnosis not present

## 2023-07-25 DIAGNOSIS — K7581 Nonalcoholic steatohepatitis (NASH): Secondary | ICD-10-CM | POA: Diagnosis not present

## 2023-07-25 LAB — CUP PACEART REMOTE DEVICE CHECK
Date Time Interrogation Session: 20250126232104
Implantable Pulse Generator Implant Date: 20231004

## 2023-07-26 ENCOUNTER — Encounter: Payer: Self-pay | Admitting: Cardiology

## 2023-07-26 ENCOUNTER — Other Ambulatory Visit: Payer: Self-pay | Admitting: Internal Medicine

## 2023-07-26 NOTE — Progress Notes (Signed)
Carelink Summary Report / Loop Recorder

## 2023-08-01 ENCOUNTER — Ambulatory Visit: Payer: Medicare PPO | Admitting: Internal Medicine

## 2023-08-04 ENCOUNTER — Ambulatory Visit: Payer: Medicare PPO

## 2023-08-05 ENCOUNTER — Ambulatory Visit (INDEPENDENT_AMBULATORY_CARE_PROVIDER_SITE_OTHER): Payer: Medicare PPO

## 2023-08-05 ENCOUNTER — Other Ambulatory Visit: Payer: Self-pay | Admitting: Internal Medicine

## 2023-08-05 DIAGNOSIS — E538 Deficiency of other specified B group vitamins: Secondary | ICD-10-CM | POA: Diagnosis not present

## 2023-08-05 MED ORDER — CYANOCOBALAMIN 1000 MCG/ML IJ SOLN
1000.0000 ug | Freq: Once | INTRAMUSCULAR | Status: AC
  Administered 2023-08-05: 1000 ug via INTRAMUSCULAR

## 2023-08-05 NOTE — Progress Notes (Signed)
 Patient presented for B 12 injection to right deltoid, patient voiced no concerns nor showed any signs of distress during injection.

## 2023-08-12 ENCOUNTER — Ambulatory Visit: Payer: Medicare PPO | Admitting: Urology

## 2023-08-22 ENCOUNTER — Encounter: Payer: Self-pay | Admitting: Internal Medicine

## 2023-08-24 NOTE — Progress Notes (Unsigned)
 Cardiology Clinic Note   Date: 08/26/2023 ID: [name]., DOB [birth year], MRN [id number]  Primary Cardiologist:  Debbe Odea, MD  Chief Complaint   Charlann Noss Creed Kail. is a 73 y.o. male who presents to the clinic today for routine follow up.   Patient Profile   Maximillion Gill. is followed by Dr. Azucena Cecil for the history outlined below.      Past medical history significant for: PFO. TEE 12/09/2021: EF 60 to 65%.  Normal RV function.  No LA/LAA thrombus.  Mild MR.  Evidence of atrial level shunting detected.  Agitated saline contrast bubble study was positive with shunting observed within 3-6 cardiac cycles suggestive of interatrial shunt.  Atrial septum appears aneurysmal with evidence of PFO. Aortic atherosclerosis. Hypertension. Hyperlipidemia. Lipid panel 07/01/2023: Direct LDL 72, HDL 65, TG 42, total 145. Emphysema. T2DM. Past tobacco abuse. CVA. Echo 10/06/2021: EF 60 to 65%.  No RWMA.  Normal diastolic parameters.  Normal RV function.  Mild RVH.  Trivial MR.  Agitated saline contrast bubble study was positive with shunting observed within 3-6 cardiac cycles suggestive of inter-atrial shunt. 14-day ZIO 12/01/2021: Predominant underlying rhythm was sinus.  HR 57 to 194 bpm, average HR 95 bpm.  2 runs of NSVT, fastest/longest lasting 15 beats with max rate 158 bpm, average 126 bpm.  7 runs of SVT with fastest lasting 12 beats with max rate 194 bpm, longest lasting 14 beats with average rate 162 bpm.  No evidence of A-fib or a flutter.  Rare ectopy. Loop recorder implantation 03/31/2022. Remote device check 07/24/2023: Normal device function.  Battery status okay.  No arrhythmias.  In summary, patient underwent hospital admission on 10/06/2021 due to right arm numbness. Brain MRI revealed acute left postcentral gyrus stroke. He was started on aspirin and Plavix. Echo revealed normal LV/RV function, bubble study positive for interatrial shunting. Patient wore  a 14-day ZIO in June 2023 which showed paroxysmal SVT and 2 runs of NSVT, no evidence of A-fib/a-flutter. He was evaluated by Dr. Azucena Cecil on 12/07/2021 and was scheduled for TEE which confirmed interatrial shunting with aneurysmal atrial septum and evidence of PFO. Patient was referred for evaluation by structural heart. He was seen by Dr. Lynnette Caffey on 12/21/2021 and it was felt PFO was less likely implicated in patient's stroke. It was suggested he undergo loop implantation.   Patient was last seen in the office by me on 04/20/2023 for routine follow-up.  He had no cardiac complaints at that time.  He had been in active for about 3 months secondary to knee injury after fall.  Prior to that he had been working out regularly in the gym and walking for about 40 minutes and plan to return.  No medication changes were made.    History of Present Illness    Today, patient is doing well. Patient denies shortness of breath, dyspnea on exertion, lower extremity edema, orthopnea or PND. No chest pain, pressure, or tightness. No palpitations.  He reports an occasional odd sensation on the left side of his chest that he feels is related to his lungs. He cannot really describe it. He states it is not painful, tightness or pressure. It happens less than once a month and lasts just a few minutes before resolving on its own. It does not happen with exertion. Discussed further evaluation and he does not feel it is necessary at this time. He has not returned to the gym as he first planned at his last  visit secondary to his wife having a fall with subsequent brain bleed. She is now residing at a SNF while going through rehab. He states she is progressing well and hopes to bring her home soon. He does stay active tracking his steps and performing light to moderate household activities.     ROS: All other systems reviewed and are otherwise negative except as noted in History of Present Illness.  EKGs/Labs Reviewed    EKG  Interpretation Date/Time:  Friday August 26 2023 08:40:57 EST Ventricular Rate:  88 PR Interval:    QRS Duration:  94 QT Interval:  362 QTC Calculation: 438 R Axis:   12  Text Interpretation: Normal sinus rhythm When compared with ECG of 20-Apr-2023 08:48, No significant change Confirmed by Carlos Levering [phone]) on 08/26/2023 8:48:31 AM   07/01/2023: ALT 42; AST 31; BUN 10; Creatinine, Ser 0.76; Potassium 4.4; Sodium 137   01/27/2023: Hemoglobin 11.9; WBC 7.2    Physical Exam    VS:  BP 116/70   Pulse 88   Ht 5\' 8"  (1.727 m)   Wt 188 lb 12.8 oz (85.6 kg)   SpO2 98%   BMI 28.71 kg/m  , BMI Body mass index is 28.71 kg/m.  GEN: Well nourished, well developed, in no acute distress. Neck: No JVD or carotid bruits. Cardiac:  RRR. No murmurs. No rubs or gallops.   Respiratory:  Respirations regular and unlabored. Diminished breath sounds bilaterally without rales, wheezing or rhonchi. GI: Soft, nontender, nondistended. Extremities: Radials/DP/PT 2+ and equal bilaterally. No clubbing or cyanosis. No edema.  Skin: Warm and dry, no rash. Neuro: Strength intact.  Assessment & Plan   PFO TEE June 2023 showed normal LV/RV function, bubble study was positive with shunting observed in 3-6 cardiac cycles suggestive of interatrial shunt, atrial septum appears aneurysmal with evidence of PFO.  Patient evaluated by structural heart who felt PFO was not the cause of stroke.  Patient had a loop recorder implanted. -Continue remote loop recorder checks.   Hypertension BP today 116/70. No report of headaches or dizziness.   -Continue telmisartan/amlodipine, hydrochlorothiazide.   Hyperlipidemia/Aortic arthrosclerosis LDL January 2025 72, at goal. -Continue rosuvastatin.     CVA Occurred April 2023.  Echo showed PFO confirmed by TEE.  Structural heart felt PFO did not cause stroke.  Patient had loop recorder implanted October 2023.  All remote device checks have not revealed A-fib or  other arrhythmias.  Last check January 2025 showed normal device function.  Patient denies deficits from stroke. -Continue aspirin and Plavix as managed by PCP.  Vague sensation in chest Patient denies frank chest pain, tightness or pressure. He has difficulty describing the sensation he has but feels it is related to his lungs. It occurs less than once a month, lasts just a few minutes and resolves on its own. It does not occur with exertion.  -Will defer testing for now.  -ED precautions discussed.  -Instructed to contact the office with increased or sustained episodes. He verbalized understanding.   Disposition: Return in 6 months or sooner as needed.          Signed, Etta Grandchild. Mathilde Mcwherter, DNP, NP-C

## 2023-08-26 ENCOUNTER — Ambulatory Visit: Payer: Medicare PPO | Attending: Student | Admitting: Student

## 2023-08-26 ENCOUNTER — Encounter: Payer: Self-pay | Admitting: Student

## 2023-08-26 VITALS — BP 116/70 | HR 88 | Ht 68.0 in | Wt 188.8 lb

## 2023-08-26 DIAGNOSIS — E785 Hyperlipidemia, unspecified: Secondary | ICD-10-CM | POA: Diagnosis not present

## 2023-08-26 DIAGNOSIS — R52 Pain, unspecified: Secondary | ICD-10-CM | POA: Diagnosis not present

## 2023-08-26 DIAGNOSIS — I10 Essential (primary) hypertension: Secondary | ICD-10-CM | POA: Diagnosis not present

## 2023-08-26 DIAGNOSIS — Q2112 Patent foramen ovale: Secondary | ICD-10-CM

## 2023-08-26 DIAGNOSIS — Z8673 Personal history of transient ischemic attack (TIA), and cerebral infarction without residual deficits: Secondary | ICD-10-CM | POA: Diagnosis not present

## 2023-08-26 NOTE — Patient Instructions (Signed)
 Medication Instructions:  Your Physician recommend you continue on your current medication as directed.    *If you need a refill on your cardiac medications before your next appointment, please call your pharmacy*   Lab Work: None ordered at this time  If you have labs (blood work) drawn today and your tests are completely normal, you will receive your results only by: MyChart Message (if you have MyChart) OR A paper copy in the mail If you have any lab test that is abnormal or we need to change your treatment, we will call you to review the results.   Follow-Up: At [facility], you and your health needs are our priority.  As part of our continuing mission to provide you with exceptional heart care, we have created designated Provider Care Teams.  These Care Teams include your primary Cardiologist (physician) and Advanced Practice Providers (APPs -  Physician Assistants and Nurse Practitioners) who all work together to provide you with the care you need, when you need it.    Your next appointment:   6 month(s)  Provider:   You may see Debbe Odea, MD or Carlos Levering, NP

## 2023-08-29 ENCOUNTER — Ambulatory Visit: Payer: Medicare PPO

## 2023-08-29 DIAGNOSIS — I639 Cerebral infarction, unspecified: Secondary | ICD-10-CM | POA: Diagnosis not present

## 2023-08-29 LAB — CUP PACEART REMOTE DEVICE CHECK
Date Time Interrogation Session: 20250302231229
Implantable Pulse Generator Implant Date: 20231004

## 2023-08-30 ENCOUNTER — Encounter: Payer: Self-pay | Admitting: Cardiology

## 2023-08-31 NOTE — Progress Notes (Signed)
 [facility] Summary Report / Loop Recorder

## 2023-09-07 ENCOUNTER — Ambulatory Visit (INDEPENDENT_AMBULATORY_CARE_PROVIDER_SITE_OTHER)

## 2023-09-07 DIAGNOSIS — E538 Deficiency of other specified B group vitamins: Secondary | ICD-10-CM | POA: Diagnosis not present

## 2023-09-07 MED ORDER — CYANOCOBALAMIN 1000 MCG/ML IJ SOLN
1000.0000 ug | Freq: Once | INTRAMUSCULAR | Status: AC
  Administered 2023-09-07: 1000 ug via INTRAMUSCULAR

## 2023-09-07 NOTE — Progress Notes (Signed)
 Patient presented for B 12 injection to left deltoid, patient voiced no concerns nor showed any signs of distress during injection.

## 2023-09-08 ENCOUNTER — Ambulatory Visit
Admission: RE | Admit: 2023-09-08 | Discharge: 2023-09-08 | Disposition: A | Discharge status: Home / Self Care | Payer: Medicare PPO | Source: Ambulatory Visit | Attending: Acute Care | Admitting: Acute Care

## 2023-09-08 DIAGNOSIS — Z87891 Personal history of nicotine dependence: Secondary | ICD-10-CM | POA: Diagnosis not present

## 2023-09-15 ENCOUNTER — Ambulatory Visit (INDEPENDENT_AMBULATORY_CARE_PROVIDER_SITE_OTHER): Payer: Medicare PPO | Admitting: Urology

## 2023-09-15 ENCOUNTER — Encounter: Payer: Self-pay | Admitting: Urology

## 2023-09-15 VITALS — BP 127/74 | HR 93 | Ht 68.0 in | Wt 189.0 lb

## 2023-09-15 DIAGNOSIS — Z125 Encounter for screening for malignant neoplasm of prostate: Secondary | ICD-10-CM | POA: Diagnosis not present

## 2023-09-15 DIAGNOSIS — N401 Enlarged prostate with lower urinary tract symptoms: Secondary | ICD-10-CM

## 2023-09-15 DIAGNOSIS — N400 Enlarged prostate without lower urinary tract symptoms: Secondary | ICD-10-CM | POA: Diagnosis not present

## 2023-09-15 LAB — URINALYSIS, COMPLETE
Bilirubin, UA: NEGATIVE
Glucose, UA: NEGATIVE
Ketones, UA: NEGATIVE
Leukocytes,UA: NEGATIVE
Nitrite, UA: NEGATIVE
RBC, UA: NEGATIVE
Specific Gravity, UA: 1.02 (ref 1.005–1.030)
Urobilinogen, Ur: 2 mg/dL — ABNORMAL HIGH (ref 0.2–1.0)
pH, UA: 7 (ref 5.0–7.5)

## 2023-09-15 LAB — MICROSCOPIC EXAMINATION: Bacteria, UA: NONE SEEN

## 2023-09-15 NOTE — Progress Notes (Signed)
 I, [name], acting as a scribe for Riki Altes, MD., have documented all relevant documentation on the behalf of Riki Altes, MD, as directed by Riki Altes, MD while in the presence of Riki Altes, MD.  09/15/2023 2:35 PM   Robert Robertson. Aug 25, 1950 [id number]  Referring provider: Sherlene Shams, MD [address] [place],  [place] [address]  Chief Complaint  Patient presents with   Benign Prostatic Hypertrophy   Urologic history 1. BPH without obstruction/lower urinary tract symptoms  HPI: Robert Robertson. is a 73 y.o. male presents for annual follow-up.  Since last year's visit, he is getting up 2 times per night. His slight decreased force of stream is stable, Denies dysuria, gross hematuria Denies flank, abdominal or pelvic pain PSA last year, stable at 3.4  PSA trend  Prostate Specific Ag, Serum  Latest Ref Rng 0.0 - 4.0 ng/mL  08/04/2020 2.7   08/05/2021 2.7   08/11/2022 3.4      PMH: Past Medical History:  Diagnosis Date   Allergy    Colon polyp    Diabetes mellitus    Hyperlipidemia    Hypertension     Surgical History: Past Surgical History:  Procedure Laterality Date   COLONOSCOPY  2010   10 mm adenomatous polyp in the descending colon without atypia, Midge Minium, MD   COLONOSCOPY WITH PROPOFOL N/A 07/18/2019   Procedure: COLONOSCOPY WITH PROPOFOL;  Surgeon: Earline Mayotte, MD;  Location: [facility] ENDOSCOPY;  Service: Endoscopy;  Laterality: N/A;   TEE WITHOUT CARDIOVERSION N/A 12/09/2021   Procedure: TRANSESOPHAGEAL ECHOCARDIOGRAM (TEE);  Surgeon: Debbe Odea, MD;  Location: [facility] ORS;  Service: Cardiovascular;  Laterality: N/A;    Home Medications:  Allergies as of 09/15/2023       Reactions   Pollen Extract Other (See Comments)   Hay-fever. Reaction: Sneezing        Medication List        Accurate as of September 15, 2023  2:35 PM. If you have any questions, ask your nurse or doctor.           aspirin EC 81 MG tablet Take 1 tablet (81 mg total) by mouth daily. Swallow whole.   blood glucose meter kit and supplies USE TO CHECK BLOOD SUGARS UP TO TWO TIMES DAILY   CENTRUM SILVER ADULT 50+ PO Take 1 tablet by mouth daily.   Ciclopirox Treatment 8 % Kit Generic drug: Ciclopirox Apply twice daily to nail bed and cuticle   clopidogrel 75 MG tablet Commonly known as: PLAVIX TAKE 1 TABLET(75 MG) BY MOUTH DAILY   FreeStyle Freedom Lite w/Device Kit 1 application by Does not apply route 2 (two) times daily.   freestyle lancets USE TO CHECK SUGAR TWICE A DAY   FREESTYLE LITE test strip Generic drug: glucose blood USE TO CHECK SUGARS TWICE A DAY   hydrochlorothiazide 25 MG tablet Commonly known as: HYDRODIURIL TAKE 1 TABLET(25 MG) BY MOUTH DAILY   Krill Oil 300 MG Caps Take 300 mg by mouth daily.   metFORMIN 1000 MG tablet Commonly known as: GLUCOPHAGE Take 1 tablet (1,000 mg total) by mouth 2 (two) times daily with a meal.   rosuvastatin 40 MG tablet Commonly known as: CRESTOR Take 1 tablet (40 mg total) by mouth daily.   sitaGLIPtin 100 MG tablet Commonly known as: Januvia Take 1 tablet (100 mg total) by mouth daily.   Telmisartan-amLODIPine 40-10 MG Tabs Take 1  tablet by mouth at bedtime.        Allergies:  Allergies  Allergen Reactions   Pollen Extract Other (See Comments)    Hay-fever. Reaction: Sneezing    Family History: Family History  Problem Relation Age of Onset   Diabetes Mother    Stroke Mother    Colon cancer Mother    Cancer Sister        brain tumor   Diabetes Brother    Diabetes Maternal Grandmother    Diabetes Maternal Grandfather     Social History:  reports that he quit smoking about 10 years ago. His smoking use included cigarettes. He started smoking about 55 years ago. He has a 22.5 pack-year smoking history. He has never used smokeless tobacco. He reports current alcohol use of about 2.0 standard drinks of  alcohol per week. He reports that he does not use drugs.   Physical Exam: BP 127/74   Pulse 93   Ht 5\' 8"  (1.727 m)   Wt 189 lb (85.7 kg)   BMI 28.74 kg/m   Constitutional:  Alert and oriented, No acute distress. HEENT: [place] AT Cardiovascular: No clubbing, cyanosis, or edema. Respiratory: Normal respiratory effort, no increased work of breathing. Psychiatric: Normal mood and affect.   Assessment & Plan:    1. BPH with mild lower urinary tract symptoms Symptoms are not bothersome enough that he desires medical management.  PVR today 0 mL  2.  Prostate cancer screening He has requested to continue PSA screening and PSA ordered today. He desires to continue annual follow-up  I have reviewed the above documentation for accuracy and completeness, and I agree with the above.   Riki Altes, MD  [facility] [address] [place], [place] [address] [phone]

## 2023-09-16 ENCOUNTER — Encounter: Payer: Self-pay | Admitting: Urology

## 2023-09-16 LAB — PSA: Prostate Specific Ag, Serum: 2.9 ng/mL (ref 0.0–4.0)

## 2023-09-18 ENCOUNTER — Encounter: Payer: Self-pay | Admitting: Urology

## 2023-09-23 ENCOUNTER — Telehealth: Payer: Self-pay

## 2023-09-23 DIAGNOSIS — E7800 Pure hypercholesterolemia, unspecified: Secondary | ICD-10-CM

## 2023-09-23 NOTE — Telephone Encounter (Signed)
 Spoke with pt to let him know that he will only need to have the lipid panel checked at this lab appt and then he will be due for all the other labs before his 6 month follow. Pt gave a verbal understanding. Labs orders have been placed.

## 2023-09-23 NOTE — Addendum Note (Signed)
 Addended by: Sandy Salaam on: 09/23/2023 04:02 PM   Modules accepted: Orders

## 2023-09-23 NOTE — Telephone Encounter (Signed)
 Copied from CRM [id number]. Topic: Clinical - Request for Lab/Test Order >> Sep 23, 2023 11:23 AM Drema Balzarine wrote: Reason for CRM: Patient scheduled lab appointment for cholesterol recheck and wants to know if he can have A1C and other lab work done as well

## 2023-09-28 ENCOUNTER — Telehealth: Payer: Self-pay | Admitting: Internal Medicine

## 2023-09-28 NOTE — Telephone Encounter (Signed)
 Patient would like to know if Dr Darrick Huntsman would like him to have labs before his 37m follow in July.

## 2023-09-29 ENCOUNTER — Other Ambulatory Visit (INDEPENDENT_AMBULATORY_CARE_PROVIDER_SITE_OTHER)

## 2023-09-29 DIAGNOSIS — E7800 Pure hypercholesterolemia, unspecified: Secondary | ICD-10-CM | POA: Diagnosis not present

## 2023-09-29 LAB — LIPID PANEL
Cholesterol: 104 mg/dL (ref 0–200)
HDL: 46 mg/dL (ref >39.00)
LDL Cholesterol: 43 mg/dL (ref 0–99)
NonHDL: 57.89
Total CHOL/HDL Ratio: 2
Triglycerides: 72 mg/dL (ref 0.0–149.0)
VLDL: 14.4 mg/dL (ref 0.0–40.0)

## 2023-09-29 LAB — LDL CHOLESTEROL, DIRECT: Direct LDL: 52 mg/dL

## 2023-09-29 NOTE — Telephone Encounter (Signed)
 Patient was in office and scheduled a lab appointment a few days before 01/03/2024 appointment.

## 2023-09-29 NOTE — Progress Notes (Signed)
 [facility] Summary Report / Loop Recorder

## 2023-09-30 NOTE — Telephone Encounter (Signed)
 Noted.

## 2023-10-01 ENCOUNTER — Encounter: Payer: Self-pay | Admitting: Internal Medicine

## 2023-10-02 ENCOUNTER — Encounter: Payer: Self-pay | Admitting: Internal Medicine

## 2023-10-03 ENCOUNTER — Ambulatory Visit: Payer: Medicare PPO

## 2023-10-03 ENCOUNTER — Other Ambulatory Visit: Payer: Self-pay | Admitting: Internal Medicine

## 2023-10-03 DIAGNOSIS — I639 Cerebral infarction, unspecified: Secondary | ICD-10-CM | POA: Diagnosis not present

## 2023-10-03 LAB — CUP PACEART REMOTE DEVICE CHECK
Date Time Interrogation Session: 20250406231340
Implantable Pulse Generator Implant Date: 20231004

## 2023-10-07 ENCOUNTER — Ambulatory Visit: Admitting: *Deleted

## 2023-10-07 DIAGNOSIS — E538 Deficiency of other specified B group vitamins: Secondary | ICD-10-CM | POA: Diagnosis not present

## 2023-10-07 MED ORDER — CYANOCOBALAMIN 1000 MCG/ML IJ SOLN
1000.0000 ug | Freq: Once | INTRAMUSCULAR | Status: AC
  Administered 2023-10-07: 1000 ug via INTRAMUSCULAR

## 2023-10-07 NOTE — Progress Notes (Signed)
Pt received B12 injection in right deltoid muscle. Pt tolerated it well with no complaints or concerns.  

## 2023-10-08 ENCOUNTER — Encounter: Payer: Self-pay | Admitting: Cardiology

## 2023-10-11 DIAGNOSIS — S1181XA Laceration without foreign body of other specified part of neck, initial encounter: Secondary | ICD-10-CM | POA: Diagnosis not present

## 2023-10-17 ENCOUNTER — Other Ambulatory Visit: Payer: Self-pay | Admitting: Acute Care

## 2023-10-17 DIAGNOSIS — Z122 Encounter for screening for malignant neoplasm of respiratory organs: Secondary | ICD-10-CM

## 2023-10-17 DIAGNOSIS — Z87891 Personal history of nicotine dependence: Secondary | ICD-10-CM

## 2023-11-07 ENCOUNTER — Ambulatory Visit (INDEPENDENT_AMBULATORY_CARE_PROVIDER_SITE_OTHER): Payer: Medicare PPO

## 2023-11-07 DIAGNOSIS — I639 Cerebral infarction, unspecified: Secondary | ICD-10-CM | POA: Diagnosis not present

## 2023-11-07 LAB — CUP PACEART REMOTE DEVICE CHECK
Date Time Interrogation Session: 20250511234011
Implantable Pulse Generator Implant Date: 20231004

## 2023-11-08 ENCOUNTER — Ambulatory Visit

## 2023-11-08 DIAGNOSIS — E538 Deficiency of other specified B group vitamins: Secondary | ICD-10-CM

## 2023-11-08 MED ORDER — CYANOCOBALAMIN 1000 MCG/ML IJ SOLN
1000.0000 ug | Freq: Once | INTRAMUSCULAR | Status: AC
  Administered 2023-11-08: 1000 ug via INTRAMUSCULAR

## 2023-11-08 NOTE — Progress Notes (Signed)
 Patient presented for B 12 injection to left deltoid, patient voiced no concerns nor showed any signs of distress during injection.

## 2023-11-13 ENCOUNTER — Ambulatory Visit: Payer: Self-pay | Admitting: Cardiology

## 2023-11-13 ENCOUNTER — Other Ambulatory Visit: Payer: Self-pay | Admitting: Internal Medicine

## 2023-11-14 NOTE — Progress Notes (Signed)
 [facility] Summary Report / Loop Recorder

## 2023-11-14 NOTE — Telephone Encounter (Signed)
 Refilled: 2/8/20024 Last OV: 07/04/2023 Next OV: 01/03/2024

## 2023-12-02 ENCOUNTER — Other Ambulatory Visit: Payer: Self-pay | Admitting: Internal Medicine

## 2023-12-08 ENCOUNTER — Other Ambulatory Visit: Payer: Self-pay

## 2023-12-08 ENCOUNTER — Telehealth: Payer: Self-pay | Admitting: Internal Medicine

## 2023-12-08 ENCOUNTER — Ambulatory Visit (INDEPENDENT_AMBULATORY_CARE_PROVIDER_SITE_OTHER)

## 2023-12-08 DIAGNOSIS — I639 Cerebral infarction, unspecified: Secondary | ICD-10-CM | POA: Diagnosis not present

## 2023-12-08 LAB — CUP PACEART REMOTE DEVICE CHECK
Date Time Interrogation Session: 20250611232801
Implantable Pulse Generator Implant Date: 20231004

## 2023-12-08 MED ORDER — METFORMIN HCL 1000 MG PO TABS: 1000.0000 mg | ORAL_TABLET | Freq: Two times a day (BID) | ORAL | 0 refills | Status: DC

## 2023-12-08 NOTE — Telephone Encounter (Signed)
 Prescription Request  12/08/2023  LOV: 07/04/2023  What is the name of the medication or equipment?  metFORMIN  (GLUCOPHAGE ) 1000 MG tablet   Have you contacted your pharmacy to request a refill? Yes   Which pharmacy would you like this sent to?  [id number] - [place], [place] - [address] AT [place] [address] [place] [address] Phone: [phone] Fax: [phone]  [facility] - [place], [place] - [address] [place] [address] Phone: [phone] Fax: [phone]    Patient notified that their request is being sent to the clinical staff for review and that they should receive a response within 2 business days.   Please advise at [facility]  [phone]   *Pt states he has around a week left of med and no refills. Please give pt a call when script has been sent in, per his request.*

## 2023-12-08 NOTE — Telephone Encounter (Signed)
 Metformin  prescription has been sent to patient's requested. 30-day supply was sent to get patient to his scheduled appointment with Dr Madelon Scheuermann on 01/03/24.

## 2023-12-09 ENCOUNTER — Ambulatory Visit (INDEPENDENT_AMBULATORY_CARE_PROVIDER_SITE_OTHER)

## 2023-12-09 DIAGNOSIS — E538 Deficiency of other specified B group vitamins: Secondary | ICD-10-CM

## 2023-12-09 MED ORDER — CYANOCOBALAMIN 1000 MCG/ML IJ SOLN
1000.0000 ug | Freq: Once | INTRAMUSCULAR | Status: AC
  Administered 2023-12-09: 1000 ug via INTRAMUSCULAR

## 2023-12-09 NOTE — Progress Notes (Signed)
 After obtaining consent, and per orders of Dr.Tullo, injection of B-12 given IM in R Deltoid by Virgina Grills, CMA. Patient tolerated injection well.

## 2023-12-10 ENCOUNTER — Ambulatory Visit: Payer: Self-pay | Admitting: Cardiology

## 2023-12-21 NOTE — Progress Notes (Signed)
 [facility] Summary Report / Loop Recorder

## 2023-12-29 ENCOUNTER — Other Ambulatory Visit

## 2023-12-29 ENCOUNTER — Other Ambulatory Visit: Payer: Self-pay | Admitting: Internal Medicine

## 2023-12-29 DIAGNOSIS — I10 Essential (primary) hypertension: Secondary | ICD-10-CM | POA: Diagnosis not present

## 2023-12-29 DIAGNOSIS — E1169 Type 2 diabetes mellitus with other specified complication: Secondary | ICD-10-CM | POA: Diagnosis not present

## 2023-12-29 DIAGNOSIS — E782 Mixed hyperlipidemia: Secondary | ICD-10-CM | POA: Diagnosis not present

## 2023-12-29 DIAGNOSIS — E7800 Pure hypercholesterolemia, unspecified: Secondary | ICD-10-CM

## 2023-12-29 LAB — LIPID PANEL
Cholesterol: 111 mg/dL (ref 0–200)
HDL: 54.8 mg/dL (ref >39.00)
LDL Cholesterol: 46 mg/dL (ref 0–99)
NonHDL: 55.99
Total CHOL/HDL Ratio: 2
Triglycerides: 48 mg/dL (ref 0.0–149.0)
VLDL: 9.6 mg/dL (ref 0.0–40.0)

## 2023-12-29 LAB — COMPREHENSIVE METABOLIC PANEL WITH GFR
ALT: 42 U/L (ref 0–53)
AST: 46 U/L — ABNORMAL HIGH (ref 0–37)
Albumin: 4.6 g/dL (ref 3.5–5.2)
Alkaline Phosphatase: 48 U/L (ref 39–117)
BUN: 14 mg/dL (ref 6–23)
CO2: 28 meq/L (ref 19–32)
Calcium: 9.6 mg/dL (ref 8.4–10.5)
Chloride: 99 meq/L (ref 96–112)
Creatinine, Ser: 0.91 mg/dL (ref 0.40–1.50)
GFR: 83.84 mL/min (ref >60.00)
Glucose, Bld: 124 mg/dL — ABNORMAL HIGH (ref 70–99)
Potassium: 3.8 meq/L (ref 3.5–5.1)
Sodium: 135 meq/L (ref 135–145)
Total Bilirubin: 0.7 mg/dL (ref 0.2–1.2)
Total Protein: 7.5 g/dL (ref 6.0–8.3)

## 2023-12-29 LAB — HEMOGLOBIN A1C: Hgb A1c MFr Bld: 7 % — ABNORMAL HIGH (ref 4.6–6.5)

## 2023-12-29 LAB — LDL CHOLESTEROL, DIRECT: Direct LDL: 46 mg/dL

## 2023-12-29 LAB — MICROALBUMIN / CREATININE URINE RATIO
Creatinine,U: 108.5 mg/dL
Microalb Creat Ratio: 14.7 mg/g (ref 0.0–30.0)
Microalb, Ur: 1.6 mg/dL (ref 0.0–1.9)

## 2024-01-02 ENCOUNTER — Ambulatory Visit: Payer: Self-pay | Admitting: Internal Medicine

## 2024-01-02 ENCOUNTER — Ambulatory Visit: Payer: Medicare PPO | Admitting: Internal Medicine

## 2024-01-03 ENCOUNTER — Ambulatory Visit (INDEPENDENT_AMBULATORY_CARE_PROVIDER_SITE_OTHER): Admitting: Internal Medicine

## 2024-01-03 ENCOUNTER — Encounter: Payer: Self-pay | Admitting: Internal Medicine

## 2024-01-03 VITALS — BP 114/66 | HR 97 | Ht 68.0 in | Wt 188.0 lb

## 2024-01-03 DIAGNOSIS — R972 Elevated prostate specific antigen [PSA]: Secondary | ICD-10-CM

## 2024-01-03 DIAGNOSIS — E7800 Pure hypercholesterolemia, unspecified: Secondary | ICD-10-CM | POA: Diagnosis not present

## 2024-01-03 DIAGNOSIS — E782 Mixed hyperlipidemia: Secondary | ICD-10-CM

## 2024-01-03 DIAGNOSIS — E538 Deficiency of other specified B group vitamins: Secondary | ICD-10-CM | POA: Diagnosis not present

## 2024-01-03 DIAGNOSIS — K7581 Nonalcoholic steatohepatitis (NASH): Secondary | ICD-10-CM

## 2024-01-03 DIAGNOSIS — J439 Emphysema, unspecified: Secondary | ICD-10-CM | POA: Diagnosis not present

## 2024-01-03 DIAGNOSIS — E1169 Type 2 diabetes mellitus with other specified complication: Secondary | ICD-10-CM

## 2024-01-03 DIAGNOSIS — I700 Atherosclerosis of aorta: Secondary | ICD-10-CM

## 2024-01-03 DIAGNOSIS — K7401 Hepatic fibrosis, early fibrosis: Secondary | ICD-10-CM

## 2024-01-03 MED ORDER — FREESTYLE LANCETS MISC: 3 refills | Status: AC

## 2024-01-03 MED ORDER — METFORMIN HCL 1000 MG PO TABS: 1000.0000 mg | ORAL_TABLET | Freq: Two times a day (BID) | ORAL | 1 refills | Status: DC

## 2024-01-03 MED ORDER — HYDROCHLOROTHIAZIDE 25 MG PO TABS: ORAL_TABLET | ORAL | 1 refills | Status: DC

## 2024-01-03 NOTE — Assessment & Plan Note (Addendum)
 Has follow up in January 2026  with repeat scan scheduled in 2026 .  He continues to enjoy alcohol occasionally .  Discussed trial of mounjaro

## 2024-01-03 NOTE — Progress Notes (Unsigned)
 Subjective:  Patient ID: Robert Victory Cosme Mickey., male    DOB: [birth year]  Age: 73 y.o. MRN: [id number]  CC: The primary encounter diagnosis was DM type 2 with diabetic mixed hyperlipidemia (HCC). Diagnoses of Pure hypercholesterolemia, Early hepatic fibrosis, Metabolic dysfunction-associated steatohepatitis (MASH), Aortic atherosclerosis (HCC), Pulmonary emphysema, unspecified emphysema type (HCC), and PSA elevation were also pertinent to this visit.   HPI Robert Henry Hainer Jr. presents for  Chief Complaint  Patient presents with  . Medical Management of Chronic Issues    6 month follow up    1) Type 2 DM:  2)  Fatty liver   Outpatient Medications Prior to Visit  Medication Sig Dispense Refill  . aspirin  EC 81 MG EC tablet Take 1 tablet (81 mg total) by mouth daily. Swallow whole. 30 tablet 11  . blood glucose meter kit and supplies USE TO CHECK BLOOD SUGARS UP TO TWO TIMES DAILY 1 each 0  . Blood Glucose Monitoring Suppl (FREESTYLE FREEDOM LITE) w/Device KIT 1 application by Does not apply route 2 (two) times daily. 1 kit 0  . Ciclopirox  (CICLOPIROX  TREATMENT) 8 % KIT APPLY TWICE A DAY TO NAIL BED AND CUTICLE 34.6 mL 3  . clopidogrel  (PLAVIX ) 75 MG tablet TAKE 1 TABLET(75 MG) BY MOUTH DAILY 90 tablet 2  . FREESTYLE LITE test strip USE TO CHECK SUGARS TWICE A DAY 200 strip 3  . Krill Oil 300 MG CAPS Take 300 mg by mouth daily.    . Multiple Vitamins-Minerals (CENTRUM SILVER ADULT 50+ PO) Take 1 tablet by mouth daily.    . rosuvastatin  (CRESTOR ) 40 MG tablet TAKE 1 TABLET(40 MG) BY MOUTH DAILY 90 tablet 3  . sitaGLIPtin  (JANUVIA ) 100 MG tablet Take 1 tablet (100 mg total) by mouth daily. 90 tablet 3  . Telmisartan -amLODIPine  40-10 MG TABS TAKE 1 TABLET AT BEDTIME 90 tablet 3  . hydrochlorothiazide  (HYDRODIURIL ) 25 MG tablet TAKE 1 TABLET(25 MG) BY MOUTH DAILY 90 tablet 1  . Lancets (FREESTYLE) lancets USE TO CHECK SUGAR TWICE A DAY 200 each 3  . metFORMIN  (GLUCOPHAGE ) 1000 MG  tablet Take 1 tablet (1,000 mg total) by mouth 2 (two) times daily with a meal. 60 tablet 0   No facility-administered medications prior to visit.    Review of Systems;  Patient denies headache, fevers, malaise, unintentional weight loss, skin rash, eye pain, sinus congestion and sinus pain, sore throat, dysphagia,  hemoptysis , cough, dyspnea, wheezing, chest pain, palpitations, orthopnea, edema, abdominal pain, nausea, melena, diarrhea, constipation, flank pain, dysuria, hematuria, urinary  Frequency, nocturia, numbness, tingling, seizures,  Focal weakness, Loss of consciousness,  Tremor, insomnia, depression, anxiety, and suicidal ideation.      Objective:  BP 114/66   Pulse 97   Ht 5' 8 (1.727 m)   Wt 188 lb (85.3 kg)   SpO2 97%   BMI 28.59 kg/m   BP Readings from Last 3 Encounters:  01/03/24 114/66  09/15/23 127/74  08/26/23 116/70    Wt Readings from Last 3 Encounters:  01/03/24 188 lb (85.3 kg)  09/15/23 189 lb (85.7 kg)  08/26/23 188 lb 12.8 oz (85.6 kg)    Physical Exam Vitals reviewed.  Constitutional:      General: He is not in acute distress.    Appearance: Normal appearance. He is normal weight. He is not ill-appearing, toxic-appearing or diaphoretic.  HENT:     Head: Normocephalic.  Eyes:     General: No scleral icterus.  Right eye: No discharge.        Left eye: No discharge.     Conjunctiva/sclera: Conjunctivae normal.  Cardiovascular:     Rate and Rhythm: Normal rate and regular rhythm.     Heart sounds: Normal heart sounds.  Pulmonary:     Effort: Pulmonary effort is normal. No respiratory distress.     Breath sounds: Normal breath sounds.  Musculoskeletal:        General: Normal range of motion.     Cervical back: Normal range of motion.  Skin:    General: Skin is warm and dry.  Neurological:     General: No focal deficit present.     Mental Status: He is alert and oriented to person, place, and time. Mental status is at baseline.   Psychiatric:        Mood and Affect: Mood normal.        Behavior: Behavior normal.        Thought Content: Thought content normal.        Judgment: Judgment normal.    Lab Results  Component Value Date   HGBA1C 7.0 (H) 12/29/2023   HGBA1C 7.0 (A) 07/04/2023   HGBA1C 7.0 (H) 01/27/2023    Lab Results  Component Value Date   CREATININE 0.91 12/29/2023   CREATININE 0.76 07/01/2023   CREATININE 0.73 01/18/2023    Lab Results  Component Value Date   WBC 7.2 01/27/2023   HGB 11.9 (L) 01/27/2023   HCT 37.6 (L) 01/27/2023   PLT 215.0 01/27/2023   GLUCOSE 124 (H) 12/29/2023   CHOL 111 12/29/2023   TRIG 48.0 12/29/2023   HDL 54.80 12/29/2023   LDLDIRECT 46.0 12/29/2023   LDLCALC 46 12/29/2023   ALT 42 12/29/2023   AST 46 (H) 12/29/2023   NA 135 12/29/2023   K 3.8 12/29/2023   CL 99 12/29/2023   CREATININE 0.91 12/29/2023   BUN 14 12/29/2023   CO2 28 12/29/2023   PSA 2.43 09/11/2021   INR 1.0 04/26/2022   HGBA1C 7.0 (H) 12/29/2023   MICROALBUR 1.6 12/29/2023    CT CHEST LUNG CA SCREEN LOW DOSE W/O CM Result Date: 10/14/2023 CLINICAL DATA:  20 pack-year history/quit 9 years ago EXAM: CT CHEST WITHOUT CONTRAST LOW-DOSE FOR LUNG CANCER SCREENING TECHNIQUE: Multidetector CT imaging of the chest was performed following the standard protocol without IV contrast. RADIATION DOSE REDUCTION: This exam was performed according to the departmental dose-optimization program which includes automated exposure control, adjustment of the mA and/or kV according to patient size and/or use of iterative reconstruction technique. COMPARISON:  09/07/2022 FINDINGS: Cardiovascular: Aortic atherosclerosis. Normal heart size, without pericardial effusion. Mediastinum/Nodes: No mediastinal or hilar adenopathy, given limitations of unenhanced CT. Lungs/Pleura: No pleural fluid. Mild centrilobular emphysema. Right lower lobe pulmonary nodule is unchanged at 6.3 mm. Upper Abdomen: Moderate to marked hepatic  steatosis. Normal imaged portions of the spleen, stomach, pancreas, adrenal glands, kidneys. Musculoskeletal: Mild-to-moderate convex right thoracic spine curvature. IMPRESSION: 1. Lung-RADS 2, benign appearance or behavior. Continue annual screening with low-dose chest CT without contrast in 12 months. 2. Hepatic steatosis. 3. Aortic Atherosclerosis (ICD10-I70.0) and Emphysema (ICD10-J43.9). Electronically Signed   By: Rockey Kilts M.D.   On: 10/14/2023 12:06    Assessment & Plan:  .DM type 2 with diabetic mixed hyperlipidemia (HCC) Assessment & Plan: BS are at goal on metformin  and  Januvia  but he is a good candidate for GLP 1 agnosit given cuncurrent MASH  He is tolerating ARB . Statin and  ASA( resumed due to acute CVA October 06 2021) .  Continue  rosuvastatin  for goal LDL < 55  Lab Results  Component Value Date   HGBA1C 7.0 (H) 12/29/2023   Lab Results  Component Value Date   LABMICR See below: 09/15/2023   LABMICR See below: 08/11/2022   MICROALBUR 1.6 12/29/2023     Lab Results  Component Value Date   CHOL 111 12/29/2023   HDL 54.80 12/29/2023   LDLCALC 46 12/29/2023   LDLDIRECT 46.0 12/29/2023   TRIG 48.0 12/29/2023   CHOLHDL 2 12/29/2023     Orders: -     Comprehensive metabolic panel with GFR; Future -     Hemoglobin A1c; Future -     AMB Referral VBCI Care Management  Pure hypercholesterolemia -     Lipid panel; Future -     LDL cholesterol, direct; Future  Early hepatic fibrosis Assessment & Plan: Has follow up in January 2026  with repeat scan scheduled in 2026 .  He continues to enjoy alcohol occasionally .  Discussed trial of mounjaro   Orders: -     AMB Referral VBCI Care Management  Metabolic dysfunction-associated steatohepatitis (MASH) -     AMB Referral VBCI Care Management  Aortic atherosclerosis [facility]) Assessment & Plan: Reviewed findings of  abdominal  CT scan  done July 23  which noted mild AA .  Patient is tolerating high potency statin  therapy     Pulmonary emphysema, unspecified emphysema type (HCC) Assessment & Plan: CONTINUE TOBACCO CESSATION AND REGULAR EXERCISE.  CONTINUE ANNUAL SCREENING FOR PULM NODULES    PSA elevation Assessment & Plan: Attributed to BPH with LUTS by Urology in Jan 2021.  Continue annual follow up with  Stoioff .   Lab Results  Component Value Date   PSA1 2.9 09/15/2023   PSA1 3.4 08/11/2022   PSA1 2.7 08/05/2021   PSA 2.43 09/11/2021   PSA 2.48 07/03/2019   PSA 2.00 05/14/2014       Other orders -     hydroCHLOROthiazide ; TAKE 1 TABLET(25 MG) BY MOUTH DAILY  Dispense: 90 tablet; Refill: 1 -     FreeStyle Lancets; USE TO CHECK SUGAR TWICE A DAY  Dispense: 200 each; Refill: 3 -     metFORMIN  HCl; Take 1 tablet (1,000 mg total) by mouth 2 (two) times daily with a meal.  Dispense: 180 tablet; Refill: 1     I spent 34 minutes on the day of this face to face encounter reviewing patient's  most recent visit with cardiology,  nephrology,  and neurology,  prior relevant surgical and non surgical procedures, recent  labs and imaging studies, counseling on weight management,  reviewing the assessment and plan with patient, and post visit ordering and reviewing of  diagnostics and therapeutics with patient  .   Follow-up: No follow-ups on file.   Verneita LITTIE Kettering, MD

## 2024-01-03 NOTE — Assessment & Plan Note (Signed)
 BS are at goal on metformin  and  Januvia  but he is a good candidate for GLP 1 agnosit given cuncurrent MASH  He is tolerating ARB . Statin and  ASA( resumed due to acute CVA October 06 2021) .  Continue  rosuvastatin  for goal LDL < 55  Lab Results  Component Value Date   HGBA1C 7.0 (H) 12/29/2023   Lab Results  Component Value Date   LABMICR See below: 09/15/2023   LABMICR See below: 08/11/2022   MICROALBUR 1.6 12/29/2023     Lab Results  Component Value Date   CHOL 111 12/29/2023   HDL 54.80 12/29/2023   LDLCALC 46 12/29/2023   LDLDIRECT 46.0 12/29/2023   TRIG 48.0 12/29/2023   CHOLHDL 2 12/29/2023

## 2024-01-03 NOTE — Assessment & Plan Note (Signed)
 Reviewed findings of  abdominal  CT scan  done July 23  which noted mild AA .  Patient is tolerating high potency statin therapy

## 2024-01-03 NOTE — Assessment & Plan Note (Signed)
 Attributed to BPH with LUTS by Urology in Jan 2021.  Continue annual follow up with  Stoioff .   Lab Results  Component Value Date   PSA1 2.9 09/15/2023   PSA1 3.4 08/11/2022   PSA1 2.7 08/05/2021   PSA 2.43 09/11/2021   PSA 2.48 07/03/2019   PSA 2.00 05/14/2014

## 2024-01-03 NOTE — Patient Instructions (Signed)
 1) you will need to continue B12 monthly injections for life  2)  I recommend alcohol abstinence and either ozempic or mounjaro (if our pharmacist can get it authorized)   3) colonoscopy is due next year  4) Fibroscan is due in January per your liver specialist

## 2024-01-03 NOTE — Assessment & Plan Note (Signed)
 Noted on annual ung CA screenig.  Has has remained tobacco abstinent and remains asymptoatic with  daily participation in EXERCISE.  CONTINUE ANNUAL SCREENING FOR PULM NODULES

## 2024-01-04 NOTE — Assessment & Plan Note (Addendum)
 Recommending lifelong parenteral therapy given positive IF Ab

## 2024-01-04 NOTE — Assessment & Plan Note (Signed)
 Goals is < 55 due to history of CVA.SABRA  HE IS ON MAX DOSE OF LIPITOR. Will recommend trial of max dose crestor  and if not at goal will recommend repatha   Lab Results  Component Value Date   CHOL 111 12/29/2023   HDL 54.80 12/29/2023   LDLCALC 46 12/29/2023   LDLDIRECT 46.0 12/29/2023   TRIG 48.0 12/29/2023   CHOLHDL 2 12/29/2023

## 2024-01-09 ENCOUNTER — Ambulatory Visit

## 2024-01-09 ENCOUNTER — Ambulatory Visit: Payer: Self-pay | Admitting: Cardiology

## 2024-01-09 DIAGNOSIS — E538 Deficiency of other specified B group vitamins: Secondary | ICD-10-CM | POA: Diagnosis not present

## 2024-01-09 DIAGNOSIS — I639 Cerebral infarction, unspecified: Secondary | ICD-10-CM

## 2024-01-09 LAB — CUP PACEART REMOTE DEVICE CHECK
Date Time Interrogation Session: 20250713234404
Implantable Pulse Generator Implant Date: 20231004

## 2024-01-09 MED ORDER — CYANOCOBALAMIN 1000 MCG/ML IJ SOLN
1000.0000 ug | Freq: Once | INTRAMUSCULAR | Status: AC
  Administered 2024-01-09: 1000 ug via INTRAMUSCULAR

## 2024-01-09 NOTE — Progress Notes (Signed)
 After obtaining consent, and per orders of Dr.Tullo, MD injection of B-12 injection given IM in L Deltoid by Doyce Croak, CMA. Patient tolerated injection well.

## 2024-01-10 ENCOUNTER — Telehealth: Payer: Self-pay

## 2024-01-10 NOTE — Progress Notes (Signed)
 Complex Care Management Note  Care Guide Note 01/10/2024 Name: Dariyon Urquilla. MRN: [id number] DOB: 08-08-1950  Norleen Victory Tirso Laws. is a 73 y.o. year old male who sees Marylynn, Verneita CROME, MD for primary care. I reached out to Norleen Victory Cosme Mickey. by phone today to offer complex care management services.  Mr. Albus was given information about Complex Care Management services today including:   The Complex Care Management services include support from the care team which includes your Nurse Care Manager, Clinical Social Worker, or Pharmacist.  The Complex Care Management team is here to help remove barriers to the health concerns and goals most important to you. Complex Care Management services are voluntary, and the patient may decline or stop services at any time by request to their care team member.   Complex Care Management Consent Status: Patient agreed to services and verbal consent obtained.   Follow up plan:  Telephone appointment with complex care management team member scheduled for:  01/16/24 at 10:30 a.m.   Encounter Outcome:  Patient Scheduled  Dreama [facility]  [facility], [facility] Care Management Assistant Direct Dial: [phone]  Fax: [phone]

## 2024-01-16 ENCOUNTER — Other Ambulatory Visit (INDEPENDENT_AMBULATORY_CARE_PROVIDER_SITE_OTHER): Admitting: Pharmacist

## 2024-01-16 DIAGNOSIS — E782 Mixed hyperlipidemia: Secondary | ICD-10-CM

## 2024-01-16 DIAGNOSIS — E1169 Type 2 diabetes mellitus with other specified complication: Secondary | ICD-10-CM

## 2024-01-16 NOTE — Progress Notes (Signed)
   01/16/2024 Name: Robert Robertson. MRN: [id number] DOB: 18-Jul-1950  Subjective  Chief Complaint  Patient presents with   Medication Access   Diabetes    Care Team: Primary Care Provider: Marylynn Verneita CROME, MD Doctor of the Day: [place]es[place]  Reason for visit: ?  Robert Robertson. is a 73 y.o. male who presents today for a telephone visit with the pharmacist due to medication access concerns regarding GLP1RA. ?  Recent visit with PCP 01/03/24. Preference to start GLP1RA for DM2 and MASH though cost concerns per patient report.    Medication Access: ?  Reports that all medications are not affordable.  Reports Ozempic/Mounjaro are not covered by insurance.   Current Patient Assistance: Not eligible with Tricare benefit  Prescription drug coverage: YES [employer] Medicare Part B Medical Benefit. No part D benefit  Tricare for Life Summary of Benefit: Tricare:  Mounjaro, Ozempic = Non-formulary. $76/month Trulicity $38 (home delivery), $43 retail pharmacy   T[employer]are Trulicity Prior Authorization Criteria: Type 2 Diabetes Taking or has tried metformin   Assessment and Plan:   1. Medication Access GLP1RA is covered by Tricare, though requires step therapy (trial of metformin ). Patient currently established on metformin . Trulicity = preferred agent. Ozempic and Mounjaro are non-formulary/not covered.  All Glp1 are reasonable for diabetes management without preference between agents for A1c reduction given A1c already at 7.0%. Expect all GLP1 to maintain/improve glycemic control in place of Januvia .  Trulicity specifically has less data compared to other Glp1 regarding benefit in secondary prevention of atherosclerosis (particularly in patients with prior Hx MI).  All GLP1 have data suggesting some benefit for MASH/atherosclerosis. Dulaglutide Improves Metabolic Dysfunction-Associated Steatohepatitis (MASH). While data for Ozempic and Mounjaro is more plentiful, being on a GLP1 in  general is expected to benefit more than no GLP1.   Prior authorization form for Trulicity uploaded to PCP eFax.  If approved, Rx to go to [facility] pharmacy per cheaper cost   Future Appointments  Date Time Provider Department Center  02/01/2024 10:50 AM [facility] ANNUAL WELLNESS VISIT [facility]  02/09/2024  7:10 AM [facility] DEVICE REMOTES [facility]  02/09/2024  3:30 PM [facility] CLINICAL SUPPORT [facility]  02/23/2024 10:30 AM Loistine Sober, NP [facility] None  03/12/2024  7:10 AM [facility] DEVICE REMOTES [facility]  03/29/2024  7:30 AM [facility] LAB [facility]  04/03/2024 10:30 AM Marylynn Verneita CROME, MD [facility]  04/12/2024  7:10 AM [facility] DEVICE REMOTES [facility]  05/14/2024  7:10 AM [facility] DEVICE REMOTES [facility]V  06/14/2024  7:10 AM [facility] DEVICE REMOTES [facility]&V  07/16/2024  7:10 AM [facility] DEVICE REMOTES [facility]V  08/16/2024  7:10 AM [facility] DEVICE REMOTES [facility]&V  09/12/2024 10:00 AM [facility] None  09/14/2024 10:00 AM Stoioff, Glendia BROCKS, MD [facility] None  09/17/2024  7:10 AM [facility] DEVICE REMOTES [facility]&V  10/18/2024  7:10 AM [facility] DEVICE REMOTES [facility]    Manuelita FABIENE Kobs, PharmD Clinical Pharmacist [facility] [phone]

## 2024-01-18 ENCOUNTER — Telehealth: Payer: Self-pay

## 2024-01-18 NOTE — Telephone Encounter (Signed)
Placed in quick sign folder.  

## 2024-01-18 NOTE — Telephone Encounter (Signed)
-----   Message from Manuelita JONELLE Kobs sent at 01/16/2024 12:46 PM EDT ----- Trulicity [employer] Prior auth form uploaded to [facility] eFax folder ---- pls have Doc of the day sign then fax to [employer] (fax# at top of form)

## 2024-01-19 NOTE — Telephone Encounter (Signed)
 Form has been faxed.

## 2024-01-25 ENCOUNTER — Other Ambulatory Visit: Payer: Self-pay | Admitting: Internal Medicine

## 2024-01-25 MED ORDER — TRULICITY 0.75 MG/0.5ML ~~LOC~~ SOAJ: SUBCUTANEOUS | 2 refills | Status: DC

## 2024-01-25 NOTE — Addendum Note (Signed)
 Addended by: MARYLYNN VERNEITA CROME on: 01/25/2024 03:58 PM   Modules accepted: Orders

## 2024-01-26 NOTE — Telephone Encounter (Signed)
 Pt is aware and gave a verbal understanding.

## 2024-01-31 ENCOUNTER — Encounter: Payer: Self-pay | Admitting: Pharmacist

## 2024-01-31 NOTE — Progress Notes (Signed)
 Brief Telephone Documentation Reason for Call: Prior Auth follow up  Summary of Call: [employer] PA faxed 01/17/24. No response received via fax.   [employer] Benefits #: [id number] DoD ID#: [id number]   [employer] [facility]: [phone] [employer] Line: [phone]  Patient has coverage for Trulicity . [employer] confirms they do see a paid claim from 7/30. Showing that medication was delivered 01/28/24.   Called patient 01/31/24 Denies issues with first Trulicity  injection. No concerns with injection device. Has marked calendar as a reminder to use his dose once weekly. No adverse effects at this time (though first injection was yesterday).  Trulicity  was affordable ~$30 or so.  Confirms stopping DPP4i. Only taking metformin  and Trulicity  0.75 mg. Reports obtaining 9-month supply of Trulicity .   Follow Up: Patient given direct line for further questions/concerns.  Manuelita FABIENE Kobs, PharmD Clinical Pharmacist [facility] [phone]

## 2024-01-31 NOTE — Progress Notes (Signed)
 [facility] Summary Report / Loop Recorder

## 2024-02-01 ENCOUNTER — Ambulatory Visit

## 2024-02-09 ENCOUNTER — Ambulatory Visit (INDEPENDENT_AMBULATORY_CARE_PROVIDER_SITE_OTHER)

## 2024-02-09 ENCOUNTER — Ambulatory Visit: Payer: Self-pay | Admitting: Cardiology

## 2024-02-09 DIAGNOSIS — I639 Cerebral infarction, unspecified: Secondary | ICD-10-CM | POA: Diagnosis not present

## 2024-02-09 DIAGNOSIS — E538 Deficiency of other specified B group vitamins: Secondary | ICD-10-CM | POA: Diagnosis not present

## 2024-02-09 LAB — CUP PACEART REMOTE DEVICE CHECK
Date Time Interrogation Session: 20250813232914
Implantable Pulse Generator Implant Date: 20231004

## 2024-02-09 MED ORDER — CYANOCOBALAMIN 1000 MCG/ML IJ SOLN
1000.0000 ug | Freq: Once | INTRAMUSCULAR | Status: AC
Start: 2024-02-09 — End: 2024-02-09
  Administered 2024-02-09: 1000 ug via INTRAMUSCULAR

## 2024-02-09 NOTE — Progress Notes (Addendum)
 Patient is in office today for a nurse visit for B12 Injection. Patient Injection was given in the  Right deltoid. Patient tolerated injection well.

## 2024-02-23 ENCOUNTER — Ambulatory Visit: Admitting: Student

## 2024-02-25 NOTE — Progress Notes (Unsigned)
 Cardiology Clinic Note   Date: 02/29/2024 ID: Robert Victory Cosme Mickey., DOB Nov 22, 1950, MRN [id number]  Primary Cardiologist:  Redell Cave, MD  Chief Complaint   Robert Robertson. is a 73 y.o. male who presents to the clinic today for routine follow up.   Patient Profile   Robert Robertson. is followed by Dr. Cave for the history outlined below.      Past medical history significant for: PFO. TEE 12/09/2021: EF 60 to 65%.  Normal RV function.  No LA/LAA thrombus.  Mild MR.  Evidence of atrial level shunting detected.  Agitated saline contrast bubble study was positive with shunting observed within 3-6 cardiac cycles suggestive of interatrial shunt.  Atrial septum appears aneurysmal with evidence of PFO. Aortic atherosclerosis. Hypertension. Hyperlipidemia. Lipid panel 12/29/2023: LDL 46, HDL 55, TG 48, total 111. Emphysema. T2DM. Past tobacco abuse. CVA. Echo 10/06/2021: EF 60 to 65%.  No RWMA.  Normal diastolic parameters.  Normal RV function.  Mild RVH.  Trivial MR.  Agitated saline contrast bubble study was positive with shunting observed within 3-6 cardiac cycles suggestive of inter-atrial shunt. 14-day ZIO 12/01/2021: Predominant underlying rhythm was sinus.  HR 57 to 194 bpm, average HR 95 bpm.  2 runs of NSVT, fastest/longest lasting 15 beats with max rate 158 bpm, average 126 bpm.  7 runs of SVT with fastest lasting 12 beats with max rate 194 bpm, longest lasting 14 beats with average rate 162 bpm.  No evidence of A-fib or a flutter.  Rare ectopy. Loop recorder implantation 03/31/2022. Remote device check 02/08/2024: Normal device function.  No arrhythmias.  Normal histogram.  Battery okay.  Program parameters appropriate.  In summary, patient underwent hospital admission on 10/06/2021 due to right arm numbness. Brain MRI revealed acute left postcentral gyrus stroke. He was started on aspirin  and Plavix . Echo revealed normal LV/RV function, bubble study positive  for interatrial shunting. Patient wore a 14-day ZIO in June 2023 which showed paroxysmal SVT and 2 runs of NSVT, no evidence of A-fib/a-flutter. He was evaluated by Dr. Cave on 12/07/2021 and was scheduled for TEE which confirmed interatrial shunting with aneurysmal atrial septum and evidence of PFO. Patient was referred for evaluation by structural heart. He was seen by Dr. Wendel on 12/21/2021 and it was felt PFO was less likely implicated in patient's stroke. It was suggested he undergo loop implantation.   Patient was seen for routine follow-up in October 2024.  He had no cardiac complaints at that time.  He had been inactive for about 3 months secondary to knee injury after fall.  Prior to that he had been working out regularly in the gym and walking for about 40 minutes and plan to return.  No medication changes were made.  Upon follow-up in February 2025 he reported an occasional odd sensation on the left side of his chest he felt was related to his lungs not related to exertion.  He deferred further testing.  He reported not returning to the gym secondary to his wife suffering a brain bleed and living in a SNF while going through rehab.     History of Present Illness    Today, patient reports he is doing well. Patient denies shortness of breath, dyspnea on exertion, lower extremity edema, orthopnea or PND. No chest pain, pressure, or tightness. At his last visit he described a vague sensation in his chest that was not really pain. He felt it was coming from his lungs. He is no  longer experiencing that sensation.  No palpitations.  His wife has been home since April. He has not returned to the gym. He was previously using the treadmill and doing light weight training. [facility] He has stopped drinking alcohol. He was drinking a beer daily. His liver enzyme was elevated and PCP encouraged him to stop drinking.      ROS: All other systems reviewed and are  otherwise negative except as noted in History of Present Illness.  EKGs/Labs Reviewed    EKG Interpretation Date/Time:  Wednesday February 29 2024 09:16:12 EDT Ventricular Rate:  85 PR Interval:  182 QRS Duration:  80 QT Interval:  372 QTC Calculation: 442 R Axis:   8  Text Interpretation: Normal sinus rhythm When compared with ECG of 04/20/2023 No significant change Confirmed by Loistine Sober [phone]) on 02/29/2024 9:23:58 AM   12/29/2023: ALT 42; AST 46; BUN 14; Creatinine, Ser 0.91; Potassium 3.8; Sodium 135    Physical Exam    VS:  BP 100/60 (BP Location: Left Arm, Patient Position: Sitting, Cuff Size: Normal)   Pulse 85   Ht 5' 8 (1.727 m)   Wt 185 lb 6.4 oz (84.1 kg)   SpO2 99%   BMI 28.19 kg/m  , BMI Body mass index is 28.19 kg/m.  GEN: Well nourished, well developed, in no acute distress. Neck: No JVD or carotid bruits. Cardiac:  RRR.  No murmur. No rubs or gallops.   Respiratory:  Respirations regular and unlabored. Clear to auscultation without rales, wheezing or rhonchi. GI: Soft, nontender, nondistended. Extremities: Radials/DP/PT 2+ and equal bilaterally. No clubbing or cyanosis. No edema.  Skin: Warm and dry, no rash. Neuro: Strength intact.  Assessment & Plan   Hypertension BP today 100/60. No report of headaches or dizziness.   - Continue telmisartan /amlodipine , hydrochlorothiazide .   Hyperlipidemia/Aortic arthrosclerosis LDL 46 July 2025, at goal. - Continue rosuvastatin .     CVA/PFO Occurred April 2023.  Echo showed PFO confirmed by TEE.  Structural heart felt PFO did not cause stroke.  Patient had loop recorder implanted October 2023.  All remote device checks have not revealed A-fib or other arrhythmias.  Last check August 2025 showed normal device function.  Patient denies deficits from stroke. - Continue aspirin  and Plavix  as managed by PCP. - Continue remote loop recorder checks.  Disposition: Return in 6 months or sooner as needed.           Signed, Sober HERO. Jerzy Roepke, DNP, NP-C

## 2024-02-29 ENCOUNTER — Ambulatory Visit: Attending: Student | Admitting: Student

## 2024-02-29 ENCOUNTER — Encounter: Payer: Self-pay | Admitting: Student

## 2024-02-29 VITALS — BP 100/60 | HR 85 | Ht 68.0 in | Wt 185.4 lb

## 2024-02-29 DIAGNOSIS — Z8673 Personal history of transient ischemic attack (TIA), and cerebral infarction without residual deficits: Secondary | ICD-10-CM | POA: Diagnosis not present

## 2024-02-29 DIAGNOSIS — Q2112 Patent foramen ovale: Secondary | ICD-10-CM

## 2024-02-29 DIAGNOSIS — E7800 Pure hypercholesterolemia, unspecified: Secondary | ICD-10-CM

## 2024-02-29 DIAGNOSIS — I10 Essential (primary) hypertension: Secondary | ICD-10-CM | POA: Diagnosis not present

## 2024-02-29 DIAGNOSIS — I700 Atherosclerosis of aorta: Secondary | ICD-10-CM

## 2024-02-29 NOTE — Patient Instructions (Signed)
 Medication Instructions:  Your physician recommends that you continue on your current medications as directed. Please refer to the Current Medication list given to you today.   *If you need a refill on your cardiac medications before your next appointment, please call your pharmacy*  Lab Work: None ordered at this time  If you have labs (blood work) drawn today and your tests are completely normal, you will receive your results only by: MyChart Message (if you have MyChart) OR A paper copy in the mail If you have any lab test that is abnormal or we need to change your treatment, we will call you to review the results.  Testing/Procedures: None ordered at this time   Follow-Up: At [facility], you and your health needs are our priority.  As part of our continuing mission to provide you with exceptional heart care, our providers are all part of one team.  This team includes your primary Cardiologist (physician) and Advanced Practice Providers or APPs (Physician Assistants and Nurse Practitioners) who all work together to provide you with the care you need, when you need it.  Your next appointment:   6 month(s)  Provider:   Redell Cave, MD or Barnie Hila, NP    We recommend signing up for the patient portal called MyChart.  Sign up information is provided on this After Visit Summary.  MyChart is used to connect with patients for Virtual Visits (Telemedicine).  Patients are able to view lab/test results, encounter notes, upcoming appointments, etc.  Non-urgent messages can be sent to your provider as well.   To learn more about what you can do with MyChart, go to [email or web address].

## 2024-03-12 ENCOUNTER — Ambulatory Visit

## 2024-03-12 ENCOUNTER — Ambulatory Visit (INDEPENDENT_AMBULATORY_CARE_PROVIDER_SITE_OTHER)

## 2024-03-12 DIAGNOSIS — I639 Cerebral infarction, unspecified: Secondary | ICD-10-CM

## 2024-03-13 ENCOUNTER — Ambulatory Visit

## 2024-03-13 DIAGNOSIS — E538 Deficiency of other specified B group vitamins: Secondary | ICD-10-CM | POA: Diagnosis not present

## 2024-03-13 LAB — CUP PACEART REMOTE DEVICE CHECK
Date Time Interrogation Session: 20250914233541
Implantable Pulse Generator Implant Date: 20231004

## 2024-03-13 MED ORDER — CYANOCOBALAMIN 1000 MCG/ML IJ SOLN
1000.0000 ug | Freq: Once | INTRAMUSCULAR | Status: AC
  Administered 2024-03-13: 1000 ug via INTRAMUSCULAR

## 2024-03-13 NOTE — Progress Notes (Signed)
 Pt received B12 injection in Left  deltoid muscle. Pt tolerated it well with no complaints or concerns.

## 2024-03-15 ENCOUNTER — Ambulatory Visit: Payer: Self-pay | Admitting: Cardiology

## 2024-03-19 NOTE — Progress Notes (Signed)
 Remote Loop Recorder Transmission

## 2024-03-21 NOTE — Progress Notes (Signed)
 Remote Loop Recorder Transmission

## 2024-03-29 ENCOUNTER — Other Ambulatory Visit

## 2024-04-03 ENCOUNTER — Ambulatory Visit: Admitting: Internal Medicine

## 2024-04-04 NOTE — Progress Notes (Signed)
 Remote Loop Recorder Transmission

## 2024-04-09 ENCOUNTER — Ambulatory Visit

## 2024-04-10 ENCOUNTER — Other Ambulatory Visit

## 2024-04-10 DIAGNOSIS — E782 Mixed hyperlipidemia: Secondary | ICD-10-CM

## 2024-04-10 DIAGNOSIS — E7800 Pure hypercholesterolemia, unspecified: Secondary | ICD-10-CM

## 2024-04-10 DIAGNOSIS — E1169 Type 2 diabetes mellitus with other specified complication: Secondary | ICD-10-CM

## 2024-04-10 LAB — LIPID PANEL
Cholesterol: 98 mg/dL (ref 0–200)
HDL: 47.1 mg/dL (ref >39.00)
LDL Cholesterol: 40 mg/dL (ref 0–99)
NonHDL: 51.23
Total CHOL/HDL Ratio: 2
Triglycerides: 56 mg/dL (ref 0.0–149.0)
VLDL: 11.2 mg/dL (ref 0.0–40.0)

## 2024-04-10 LAB — COMPREHENSIVE METABOLIC PANEL WITH GFR
ALT: 32 U/L (ref 0–53)
AST: 27 U/L (ref 0–37)
Albumin: 4.5 g/dL (ref 3.5–5.2)
Alkaline Phosphatase: 57 U/L (ref 39–117)
BUN: 15 mg/dL (ref 6–23)
CO2: 28 meq/L (ref 19–32)
Calcium: 9.3 mg/dL (ref 8.4–10.5)
Chloride: 99 meq/L (ref 96–112)
Creatinine, Ser: 0.94 mg/dL (ref 0.40–1.50)
GFR: 80.48 mL/min (ref >60.00)
Glucose, Bld: 104 mg/dL — ABNORMAL HIGH (ref 70–99)
Potassium: 3.8 meq/L (ref 3.5–5.1)
Sodium: 137 meq/L (ref 135–145)
Total Bilirubin: 0.7 mg/dL (ref 0.2–1.2)
Total Protein: 6.9 g/dL (ref 6.0–8.3)

## 2024-04-10 LAB — LDL CHOLESTEROL, DIRECT: Direct LDL: 41 mg/dL

## 2024-04-10 LAB — HEMOGLOBIN A1C: Hgb A1c MFr Bld: 7.2 % — ABNORMAL HIGH (ref 4.6–6.5)

## 2024-04-11 ENCOUNTER — Ambulatory Visit

## 2024-04-11 ENCOUNTER — Ambulatory Visit: Payer: Self-pay | Admitting: Internal Medicine

## 2024-04-11 DIAGNOSIS — I639 Cerebral infarction, unspecified: Secondary | ICD-10-CM | POA: Diagnosis not present

## 2024-04-12 ENCOUNTER — Ambulatory Visit: Payer: Self-pay | Admitting: Cardiology

## 2024-04-12 ENCOUNTER — Ambulatory Visit

## 2024-04-12 ENCOUNTER — Ambulatory Visit: Admitting: Internal Medicine

## 2024-04-12 ENCOUNTER — Encounter: Payer: Self-pay | Admitting: Internal Medicine

## 2024-04-12 ENCOUNTER — Encounter

## 2024-04-12 VITALS — BP 124/66 | HR 84 | Ht 68.0 in | Wt 183.6 lb

## 2024-04-12 DIAGNOSIS — E559 Vitamin D deficiency, unspecified: Secondary | ICD-10-CM | POA: Diagnosis not present

## 2024-04-12 DIAGNOSIS — K7401 Hepatic fibrosis, early fibrosis: Secondary | ICD-10-CM | POA: Diagnosis not present

## 2024-04-12 DIAGNOSIS — Z23 Encounter for immunization: Secondary | ICD-10-CM

## 2024-04-12 DIAGNOSIS — E7800 Pure hypercholesterolemia, unspecified: Secondary | ICD-10-CM | POA: Diagnosis not present

## 2024-04-12 DIAGNOSIS — E782 Mixed hyperlipidemia: Secondary | ICD-10-CM

## 2024-04-12 DIAGNOSIS — Z87898 Personal history of other specified conditions: Secondary | ICD-10-CM | POA: Diagnosis not present

## 2024-04-12 DIAGNOSIS — Z7984 Long term (current) use of oral hypoglycemic drugs: Secondary | ICD-10-CM | POA: Diagnosis not present

## 2024-04-12 DIAGNOSIS — E1169 Type 2 diabetes mellitus with other specified complication: Secondary | ICD-10-CM | POA: Diagnosis not present

## 2024-04-12 DIAGNOSIS — E538 Deficiency of other specified B group vitamins: Secondary | ICD-10-CM

## 2024-04-12 LAB — CUP PACEART REMOTE DEVICE CHECK
Date Time Interrogation Session: 20251014231749
Implantable Pulse Generator Implant Date: 20231004

## 2024-04-12 MED ORDER — CLOPIDOGREL BISULFATE 75 MG PO TABS
ORAL_TABLET | ORAL | 2 refills | Status: AC
Start: 2024-04-12 — End: ?

## 2024-04-12 MED ORDER — CYANOCOBALAMIN 1000 MCG/ML IJ SOLN
1000.0000 ug | Freq: Once | INTRAMUSCULAR | Status: AC
  Administered 2024-04-12: 1000 ug via INTRAMUSCULAR

## 2024-04-12 NOTE — Progress Notes (Signed)
 Subjective:  Patient ID: Robert Victory Cosme Mickey., male    DOB: 1951/06/26  Age: 73 y.o. MRN: [id number]  CC: The primary encounter diagnosis was Vitamin D deficiency. Diagnoses of DM type 2 with diabetic mixed hyperlipidemia (HCC), Pure hypercholesterolemia, Early hepatic fibrosis, Need for influenza vaccination, B12 deficiency, and History of alcohol use were also pertinent to this visit.   HPI Robert Epp. presents for  Chief Complaint  Patient presents with   Medical Management of Chronic Issues    3 month follow up    1) Type 2 DM:  now taking Trulicity  since August 5,  and metformin . Slight rise in A1c attributed to lack of exercise and lack of attention to diet.   .  2) fatty liver  he has been alcohol abstinent since last visit   AST has normalized   3)     Outpatient Medications Prior to Visit  Medication Sig Dispense Refill   aspirin  EC 81 MG EC tablet Take 1 tablet (81 mg total) by mouth daily. Swallow whole. 30 tablet 11   blood glucose meter kit and supplies USE TO CHECK BLOOD SUGARS UP TO TWO TIMES DAILY 1 each 0   Blood Glucose Monitoring Suppl (FREESTYLE FREEDOM LITE) w/Device KIT 1 application by Does not apply route 2 (two) times daily. 1 kit 0   Ciclopirox  (CICLOPIROX  TREATMENT) 8 % KIT APPLY TWICE A DAY TO NAIL BED AND CUTICLE 34.6 mL 3   Dulaglutide  (TRULICITY ) 0.75 MG/0.5ML SOAJ inject 0.75 mg into skin once weekly 6 mL 2   FREESTYLE LITE test strip USE TO CHECK SUGARS TWICE A DAY 200 strip 3   hydrochlorothiazide  (HYDRODIURIL ) 25 MG tablet TAKE 1 TABLET(25 MG) BY MOUTH DAILY 90 tablet 1   Krill Oil 300 MG CAPS Take 300 mg by mouth daily.     Lancets (FREESTYLE) lancets USE TO CHECK SUGAR TWICE A DAY 200 each 3   metFORMIN  (GLUCOPHAGE ) 1000 MG tablet Take 1 tablet (1,000 mg total) by mouth 2 (two) times daily with a meal. 180 tablet 1   Multiple Vitamins-Minerals (CENTRUM SILVER ADULT 50+ PO) Take 1 tablet by mouth daily.     rosuvastatin  (CRESTOR )  40 MG tablet TAKE 1 TABLET(40 MG) BY MOUTH DAILY 90 tablet 3   Telmisartan -amLODIPine  40-10 MG TABS TAKE 1 TABLET AT BEDTIME 90 tablet 3   clopidogrel  (PLAVIX ) 75 MG tablet TAKE 1 TABLET(75 MG) BY MOUTH DAILY 90 tablet 2   No facility-administered medications prior to visit.    Review of Systems;  Patient denies headache, fevers, malaise, unintentional weight loss, skin rash, eye pain, sinus congestion and sinus pain, sore throat, dysphagia,  hemoptysis , cough, dyspnea, wheezing, chest pain, palpitations, orthopnea, edema, abdominal pain, nausea, melena, diarrhea, constipation, flank pain, dysuria, hematuria, urinary  Frequency, nocturia, numbness, tingling, seizures,  Focal weakness, Loss of consciousness,  Tremor, insomnia, depression, anxiety, and suicidal ideation.      Objective:  BP 124/66   Pulse 84   Ht 5' 8 (1.727 m)   Wt 183 lb 9.6 oz (83.3 kg)   SpO2 96%   BMI 27.92 kg/m   BP Readings from Last 3 Encounters:  04/12/24 124/66  02/29/24 100/60  01/03/24 114/66    Wt Readings from Last 3 Encounters:  04/12/24 183 lb 9.6 oz (83.3 kg)  02/29/24 185 lb 6.4 oz (84.1 kg)  01/03/24 188 lb (85.3 kg)    Physical Exam Vitals reviewed.  Constitutional:      General:  He is not in acute distress.    Appearance: Normal appearance. He is normal weight. He is not ill-appearing, toxic-appearing or diaphoretic.  HENT:     Head: Normocephalic.  Eyes:     General: No scleral icterus.       Right eye: No discharge.        Left eye: No discharge.     Conjunctiva/sclera: Conjunctivae normal.  Cardiovascular:     Rate and Rhythm: Normal rate and regular rhythm.     Heart sounds: Normal heart sounds.  Pulmonary:     Effort: Pulmonary effort is normal. No respiratory distress.     Breath sounds: Normal breath sounds.  Musculoskeletal:        General: Normal range of motion.     Cervical back: Normal range of motion.  Skin:    General: Skin is warm and dry.  Neurological:      General: No focal deficit present.     Mental Status: He is alert and oriented to person, place, and time. Mental status is at baseline.  Psychiatric:        Mood and Affect: Mood normal.        Behavior: Behavior normal.        Thought Content: Thought content normal.        Judgment: Judgment normal.     Lab Results  Component Value Date   HGBA1C 7.2 (H) 04/10/2024   HGBA1C 7.0 (H) 12/29/2023   HGBA1C 7.0 (A) 07/04/2023    Lab Results  Component Value Date   CREATININE 0.94 04/10/2024   CREATININE 0.91 12/29/2023   CREATININE 0.76 07/01/2023    Lab Results  Component Value Date   WBC 7.2 01/27/2023   HGB 11.9 (L) 01/27/2023   HCT 37.6 (L) 01/27/2023   PLT 215.0 01/27/2023   GLUCOSE 104 (H) 04/10/2024   CHOL 98 04/10/2024   TRIG 56.0 04/10/2024   HDL 47.10 04/10/2024   LDLDIRECT 41.0 04/10/2024   LDLCALC 40 04/10/2024   ALT 32 04/10/2024   AST 27 04/10/2024   NA 137 04/10/2024   K 3.8 04/10/2024   CL 99 04/10/2024   CREATININE 0.94 04/10/2024   BUN 15 04/10/2024   CO2 28 04/10/2024   PSA 2.43 09/11/2021   INR 1.0 04/26/2022   HGBA1C 7.2 (H) 04/10/2024   MICROALBUR 1.6 12/29/2023    CT CHEST LUNG CA SCREEN LOW DOSE W/O CM Result Date: 10/14/2023 CLINICAL DATA:  20 pack-year history/quit 9 years ago EXAM: CT CHEST WITHOUT CONTRAST LOW-DOSE FOR LUNG CANCER SCREENING TECHNIQUE: Multidetector CT imaging of the chest was performed following the standard protocol without IV contrast. RADIATION DOSE REDUCTION: This exam was performed according to the departmental dose-optimization program which includes automated exposure control, adjustment of the mA and/or kV according to patient size and/or use of iterative reconstruction technique. COMPARISON:  09/07/2022 FINDINGS: Cardiovascular: Aortic atherosclerosis. Normal heart size, without pericardial effusion. Mediastinum/Nodes: No mediastinal or hilar adenopathy, given limitations of unenhanced CT. Lungs/Pleura: No pleural  fluid. Mild centrilobular emphysema. Right lower lobe pulmonary nodule is unchanged at 6.3 mm. Upper Abdomen: Moderate to marked hepatic steatosis. Normal imaged portions of the spleen, stomach, pancreas, adrenal glands, kidneys. Musculoskeletal: Mild-to-moderate convex right thoracic spine curvature. IMPRESSION: 1. Lung-RADS 2, benign appearance or behavior. Continue annual screening with low-dose chest CT without contrast in 12 months. 2. Hepatic steatosis. 3. Aortic Atherosclerosis (ICD10-I70.0) and Emphysema (ICD10-J43.9). Electronically Signed   By: Rockey Kilts M.D.   On: 10/14/2023 12:06  Assessment & Plan:  .Vitamin D deficiency -     VITAMIN D 25 Hydroxy (Vit-D Deficiency, Fractures); Future  DM type 2 with diabetic mixed hyperlipidemia [facility]) Assessment & Plan: BS are at goal on metformin  , Januvia   nand Trlucitygiven concurrent history of  MASH with progression of fibrosis noted by last evaluation in Jan 2025 by [facility].        resumed due to acute CVA October 06 2021) .  Continue  rosuvastatin  for goal LDL < 55  Lab Results  Component Value Date   HGBA1C 7.2 (H) 04/10/2024   Lab Results  Component Value Date   LABMICR See below: 09/15/2023   LABMICR See below: 08/11/2022   MICROALBUR 1.6 12/29/2023     Lab Results  Component Value Date   CHOL 98 04/10/2024   HDL 47.10 04/10/2024   LDLCALC 40 04/10/2024   LDLDIRECT 41.0 04/10/2024   TRIG 56.0 04/10/2024   CHOLHDL 2 04/10/2024     Orders: -     Hemoglobin A1c; Future  Pure hypercholesterolemia Assessment & Plan: Goals is < 55 due to history of CVA.SABRAo n 40mg  rosuvastatin   Lab Results  Component Value Date   CHOL 98 04/10/2024   HDL 47.10 04/10/2024   LDLCALC 40 04/10/2024   LDLDIRECT 41.0 04/10/2024   TRIG 56.0 04/10/2024   CHOLHDL 2 04/10/2024     Orders: -     Comprehensive metabolic panel with GFR; Future  Early hepatic fibrosis Assessment & Plan: Secondary to MASH.  Has annual follow up  with [facility]  for fibroscan. Liver enzymes have normalized with alcohol abstinence x  3 months and use of Trulicity  .  Imaging is scheduled for Jan 2026 with fibroscan    Need for influenza vaccination -     Flu vaccine HIGH DOSE PF(Fluzone Trivalent)  B12 deficiency -     Cyanocobalamin   History of alcohol use Assessment & Plan: He has suspended alcohols use after his last visit    Other orders -     Clopidogrel  Bisulfate; TAKE 1 TABLET(75 MG) BY MOUTH DAILY  Dispense: 90 tablet; Refill: 2     I spent 34 minutes on the day of this face to face encounter reviewing patient's  most recent visit with cardiology,  nephrology,  and neurology,  prior relevant surgical and non surgical procedures, recent  labs and imaging studies, counseling on weight management,  reviewing the assessment and plan with patient, and post visit ordering and reviewing of  diagnostics and therapeutics with patient  .   Follow-up: Return in about 3 months (around 07/13/2024) for follow up diabetes.   Robert LITTIE Kettering, MD

## 2024-04-12 NOTE — Patient Instructions (Addendum)
 No changes to meds today . Get back to exercising and more moderate intake of ice cream, cookies etc to get A1c down.   Sugar alcohol ok ;  not the same as the alcohol in beverages  See you in 3 months . Labs do not require fasting this time.

## 2024-04-12 NOTE — Assessment & Plan Note (Addendum)
 Secondary to [facility].  Has annual follow up with [facility]  for fibroscan. Liver enzymes have normalized with alcohol abstinence x  3 months and use of Trulicity  .  Imaging is scheduled for Jan 2026 with fibroscan

## 2024-04-13 ENCOUNTER — Ambulatory Visit

## 2024-04-13 NOTE — Progress Notes (Signed)
 Remote Loop Recorder Transmission

## 2024-04-14 NOTE — Assessment & Plan Note (Addendum)
 Goals is < 55 due to history of CVA.SABRAo n 40mg  rosuvastatin   Lab Results  Component Value Date   CHOL 98 04/10/2024   HDL 47.10 04/10/2024   LDLCALC 40 04/10/2024   LDLDIRECT 41.0 04/10/2024   TRIG 56.0 04/10/2024   CHOLHDL 2 04/10/2024

## 2024-04-14 NOTE — Assessment & Plan Note (Signed)
 BS are at goal on metformin  , Januvia   nand Trlucitygiven concurrent history of  MASH with progression of fibrosis noted by last evaluation in Jan 2025 by [facility].        resumed due to acute CVA October 06 2021) .  Continue  rosuvastatin  for goal LDL < 55  Lab Results  Component Value Date   HGBA1C 7.2 (H) 04/10/2024   Lab Results  Component Value Date   LABMICR See below: 09/15/2023   LABMICR See below: 08/11/2022   MICROALBUR 1.6 12/29/2023     Lab Results  Component Value Date   CHOL 98 04/10/2024   HDL 47.10 04/10/2024   LDLCALC 40 04/10/2024   LDLDIRECT 41.0 04/10/2024   TRIG 56.0 04/10/2024   CHOLHDL 2 04/10/2024

## 2024-04-14 NOTE — Assessment & Plan Note (Signed)
 He has suspended alcohols use after his last visit

## 2024-04-19 ENCOUNTER — Ambulatory Visit

## 2024-05-02 ENCOUNTER — Ambulatory Visit: Admitting: *Deleted

## 2024-05-02 ENCOUNTER — Telehealth: Payer: Self-pay | Admitting: *Deleted

## 2024-05-02 VITALS — Ht 68.0 in | Wt 182.0 lb

## 2024-05-02 DIAGNOSIS — Z0000 Encounter for general adult medical examination without abnormal findings: Secondary | ICD-10-CM

## 2024-05-02 DIAGNOSIS — Z1211 Encounter for screening for malignant neoplasm of colon: Secondary | ICD-10-CM

## 2024-05-02 NOTE — Patient Instructions (Signed)
 [name],  Thank you for taking the time for your Medicare Wellness Visit. I appreciate your continued commitment to your health goals. Please review the care plan we discussed, and feel free to reach out if I can assist you further.  Please note that Annual Wellness Visits do not include a physical exam. Some assessments may be limited, especially if the visit was conducted virtually. If needed, we may recommend an in-person follow-up with your provider.  Ongoing Care Seeing your primary care provider every 3 to 6 months helps us  monitor your health and provide consistent, personalized care.  Remember to keep your vaccines up to date.  Referrals If a referral was made during today's visit and you haven't received any updates within two weeks, please contact the referred provider directly to check on the status.  Recommended Screenings:  Health Maintenance  Topic Date Due   COVID-19 Vaccine (9 - 2025-26 season) 02/27/2024   Colon Cancer Screening  07/17/2024   Eye exam for diabetics  06/26/2024   DTaP/Tdap/Td vaccine (2 - Td or Tdap) 08/07/2024   Screening for Lung Cancer  09/07/2024   Hemoglobin A1C  10/09/2024   Yearly kidney health urinalysis for diabetes  12/28/2024   Complete foot exam   01/02/2025   Yearly kidney function blood test for diabetes  04/10/2025   Medicare Annual Wellness Visit  05/02/2025   Pneumococcal Vaccine for age over 64  Completed   Flu Shot  Completed   Hepatitis C Screening  Completed   Zoster (Shingles) Vaccine  Completed   Meningitis B Vaccine  Aged Out   Hepatitis B Vaccine  Discontinued       05/02/2024   10:20 AM  Advanced Directives  Does Patient Have a Medical Advance Directive? No  Would patient like information on creating a medical advance directive? No - Patient declined    Vision: Annual vision screenings are recommended for early detection of glaucoma, cataracts, and diabetic retinopathy. These exams can also reveal signs of  chronic conditions such as diabetes and high blood pressure.  Dental: Annual dental screenings help detect early signs of oral cancer, gum disease, and other conditions linked to overall health, including heart disease and diabetes.  Please see the attached documents for additional preventive care recommendations.

## 2024-05-02 NOTE — Progress Notes (Cosign Needed)
 Subjective:   Robert Robertson. is a 73 y.o. male who presents for a Medicare Annual Wellness Visit.  Allergies (verified) Pollen extract   History: Past Medical History:  Diagnosis Date   Allergy 11/26/1960   Pollen Extract   Colon polyp    Diabetes mellitus    Emphysema of lung (HCC)    Hyperlipidemia    Hypertension    Stroke (HCC) 10/06/2021   Minor Stroke   Past Surgical History:  Procedure Laterality Date   COLONOSCOPY  2010   10 mm adenomatous polyp in the descending colon without atypia, Rogelia Copping, MD   COLONOSCOPY WITH PROPOFOL  N/A 07/18/2019   Procedure: COLONOSCOPY WITH PROPOFOL ;  Surgeon: Dessa Reyes ORN, MD;  Location: [facility] ENDOSCOPY;  Service: Endoscopy;  Laterality: N/A;   TEE WITHOUT CARDIOVERSION N/A 12/09/2021   Procedure: TRANSESOPHAGEAL ECHOCARDIOGRAM (TEE);  Surgeon: Darliss Rogue, MD;  Location: [facility] ORS;  Service: Cardiovascular;  Laterality: N/A;   Family History  Problem Relation Age of Onset   Diabetes Mother    Stroke Mother    Colon cancer Mother    Cancer Sister        brain tumor   Diabetes Brother    Diabetes Maternal Grandmother    Diabetes Maternal Grandfather    Social History   Occupational History   Not on file  Tobacco Use   Smoking status: Former    Current packs/day: 0.00    Average packs/day: 0.5 packs/day for 45.0 years (22.5 ttl pk-yrs)    Types: Cigarettes    Start date: 36    Quit date: 2015    Years since quitting: 10.8   Smokeless tobacco: Never  Vaping Use   Vaping status: Never Used  Substance and Sexual Activity   Alcohol use: Yes    Alcohol/week: 2.0 standard drinks of alcohol    Types: 2 Glasses of wine per week    Comment: 1 glass of wine 2-3 times per week   Drug use: No   Sexual activity: Not Currently   Tobacco Counseling Counseling given: Not Answered  SDOH Screenings   Food Insecurity: No Food Insecurity (04/09/2024)  Housing: Low Risk  (04/09/2024)  Transportation Needs:  No Transportation Needs (04/09/2024)  Utilities: Not At Risk (04/04/2023)  Alcohol Screen: Low Risk  (05/02/2024)  Depression (PHQ2-9): Low Risk  (05/02/2024)  Financial Resource Strain: Low Risk  (04/09/2024)  Physical Activity: Inactive (05/02/2024)  Social Connections: Socially Integrated (05/02/2024)  Stress: No Stress Concern Present (05/02/2024)  Tobacco Use: Medium Risk (05/02/2024)  Health Literacy: Adequate Health Literacy (05/02/2024)   Depression Screen    05/02/2024   10:27 AM 04/12/2024   11:58 AM 07/04/2023    2:32 PM 04/07/2023   10:35 AM 02/02/2023    3:48 PM 01/27/2023   10:34 AM 07/28/2022   11:39 AM  PHQ 2/9 Scores  PHQ - 2 Score 0 0 0 0 0 0 0  PHQ- 9 Score 0   0        Goals Addressed             This Visit's Progress    Patient Stated       Wants to get back into the gym and exercise       Visit info / Clinical Intake: Medicare Wellness Visit Type:: Subsequent Annual Wellness Visit Medicare Wellness Visit Mode:: Telephone If telephone:: video declined Interpreter Needed?: No Pre-visit prep was completed: yes AWV questionnaire completed by patient prior to visit?: no Living arrangements::  lives with spouse/significant other Patient's Overall Health Status Rating: good Typical amount of pain: none Does pain affect daily life?: no Are you currently prescribed opioids?: no  Dietary Habits and Nutritional Risks How many meals a day?: 3 Eats fruit and vegetables daily?: (!) no Most meals are obtained by: preparing own meals Diabetic:: (!) yes How often do you check your BS?: 1 Would you like to be referred to a Nutritionist or for Diabetic Management? : no  Functional Status Activities of Daily Living (to include ambulation/medication): Independent Ambulation: Independent Medication Administration: Independent Home Management: Independent Manage your own finances?: yes Primary transportation is: driving Concerns about vision?: no *vision screening is  required for WTM* Concerns about hearing?: no  Fall Screening Falls in the past year?: 0 Number of falls in past year: 0 Was there an injury with Fall?: 0 Fall Risk Category Calculator: 0 Patient Fall Risk Level: Low Fall Risk  Fall Risk Patient at Risk for Falls Due to: No Fall Risks Fall risk Follow up: Falls evaluation completed; Falls prevention discussed  Home and Transportation Safety: All rugs have non-skid backing?: yes All stairs or steps have railings?: yes Grab bars in the bathtub or shower?: yes Have non-skid surface in bathtub or shower?: (!) no Good home lighting?: yes Regular seat belt use?: yes Hospital stays in the last year:: no  Cognitive Assessment Difficulty concentrating, remembering, or making decisions? : no Will 6CIT or Mini Cog be Completed: yes What year is it?: 0 points What month is it?: 0 points Give patient an address phrase to remember (5 components): [address] [place] About what time is it?: 0 points Count backwards from 20 to 1: 0 points Say the months of the year in reverse: 0 points Repeat the address phrase from earlier: 0 points 6 CIT Score: 0 points  Advance Directives (For Healthcare) Does Patient Have a Medical Advance Directive?: No Would patient like information on creating a medical advance directive?: No - Patient declined  Reviewed/Updated  Reviewed/Updated: All        Objective:    Today's Vitals   05/02/24 1016  Weight: 182 lb (82.6 kg)  Height: 5' 8 (1.727 m)   Body mass index is 27.67 kg/m.  Current Medications (verified) Outpatient Encounter Medications as of 05/02/2024  Medication Sig   aspirin  EC 81 MG EC tablet Take 1 tablet (81 mg total) by mouth daily. Swallow whole.   blood glucose meter kit and supplies USE TO CHECK BLOOD SUGARS UP TO TWO TIMES DAILY   Blood Glucose Monitoring Suppl (FREESTYLE FREEDOM LITE) w/Device KIT 1 application by Does not apply route 2 (two) times daily.    Ciclopirox  (CICLOPIROX  TREATMENT) 8 % KIT APPLY TWICE A DAY TO NAIL BED AND CUTICLE   clopidogrel  (PLAVIX ) 75 MG tablet TAKE 1 TABLET(75 MG) BY MOUTH DAILY   Dulaglutide  (TRULICITY ) 0.75 MG/0.5ML SOAJ inject 0.75 mg into skin once weekly   FREESTYLE LITE test strip USE TO CHECK SUGARS TWICE A DAY   hydrochlorothiazide  (HYDRODIURIL ) 25 MG tablet TAKE 1 TABLET(25 MG) BY MOUTH DAILY   Krill Oil 300 MG CAPS Take 300 mg by mouth daily.   Lancets (FREESTYLE) lancets USE TO CHECK SUGAR TWICE A DAY   metFORMIN  (GLUCOPHAGE ) 1000 MG tablet Take 1 tablet (1,000 mg total) by mouth 2 (two) times daily with a meal.   Multiple Vitamins-Minerals (CENTRUM SILVER ADULT 50+ PO) Take 1 tablet by mouth daily.   Telmisartan -amLODIPine  40-10 MG TABS TAKE 1 TABLET  AT BEDTIME   rosuvastatin  (CRESTOR ) 40 MG tablet TAKE 1 TABLET(40 MG) BY MOUTH DAILY   No facility-administered encounter medications on file as of 05/02/2024.   Hearing/Vision screen Hearing Screening - Comments:: No issues Vision Screening - Comments:: Glasses, [employer]  up to date Immunizations and Health Maintenance Health Maintenance  Topic Date Due   COVID-19 Vaccine (9 - 2025-26 season) 02/27/2024   Colonoscopy  07/17/2024   OPHTHALMOLOGY EXAM  06/26/2024   DTaP/Tdap/Td (2 - Td or Tdap) 08/07/2024   Lung Cancer Screening  09/07/2024   HEMOGLOBIN A1C  10/09/2024   Diabetic kidney evaluation - Urine ACR  12/28/2024   FOOT EXAM  01/02/2025   Diabetic kidney evaluation - eGFR measurement  04/10/2025   Medicare Annual Wellness (AWV)  05/02/2025   Pneumococcal Vaccine: 50+ Years  Completed   Influenza Vaccine  Completed   Hepatitis C Screening  Completed   Zoster Vaccines- Shingrix  Completed   Meningococcal B Vaccine  Aged Out   Hepatitis B Vaccines 19-59 Average Risk  Discontinued        Assessment/Plan:  This is a routine wellness examination for Barclay.  Patient Care Team: Marylynn Verneita CROME, MD as PCP - General (Internal  Medicine) Darliss Rogue, MD as PCP - Cardiology (Cardiology) Cindie Ole DASEN, MD as PCP - Electrophysiology (Cardiology) Marylynn Verneita CROME, MD (Internal Medicine) Dingeldein, Elspeth, MD (Ophthalmology) Hays Morrison, CRNP as Nurse Practitioner (Gastroenterology) Twylla Glendia BROCKS, MD (Urology) Waddell Marcey Dawn, MD as Referring Physician (Neurology)  I have personally reviewed and noted the following in the patient's chart:   Medical and social history Use of alcohol, tobacco or illicit drugs  Current medications and supplements including opioid prescriptions. Functional ability and status Nutritional status Physical activity Advanced directives List of other physicians Hospitalizations, surgeries, and ER visits in previous 12 months Vitals Screenings to include cognitive, depression, and falls Referrals and appointments  No orders of the defined types were placed in this encounter.  In addition, I have reviewed and discussed with patient certain preventive protocols, quality metrics, and best practice recommendations. A written personalized care plan for preventive services as well as general preventive health recommendations were provided to patient.   Angeline Fredericks, LPN   88/09/[id number]   Return in 1 year (on 05/02/2025).  After Visit Summary: (MyChart) Due to this being a telephonic visit, the after visit summary with patients personalized plan was offered to patient via MyChart   Nurse Notes: Patient declines covid vaccine. Phone note sent to PCP

## 2024-05-02 NOTE — Progress Notes (Cosign Needed)
 Visit Complete: Virtual I connected with this patient by a audio enabled telemedicine application and verified that I am speaking with the correct person using two identifiers.  Patient Location: Home Provider Location: Office/Clinic   I discussed the limitations of evaluation and management by telemedicine. The patient expressed understanding and agreed to proceed.  Persons Participating in Visit: Patient

## 2024-05-02 NOTE — Telephone Encounter (Signed)
 Performed AWV Patient stated that he is aware that he is due a colonoscopy in January.  Patient's last colonoscopy was done by Dr. Dessa which has retired. Patient stated that he would like Dr. Marylynn to refer him to a GI doctor in [place].

## 2024-05-02 NOTE — Telephone Encounter (Signed)
 Pt is aware and gave a verbal understanding.

## 2024-05-04 ENCOUNTER — Encounter: Payer: Self-pay | Admitting: *Deleted

## 2024-05-12 ENCOUNTER — Encounter

## 2024-05-13 ENCOUNTER — Ambulatory Visit: Attending: Cardiology

## 2024-05-13 DIAGNOSIS — I639 Cerebral infarction, unspecified: Secondary | ICD-10-CM

## 2024-05-14 ENCOUNTER — Encounter

## 2024-05-14 LAB — CUP PACEART REMOTE DEVICE CHECK
Date Time Interrogation Session: 20251115231244
Implantable Pulse Generator Implant Date: 20231004

## 2024-05-15 NOTE — Progress Notes (Signed)
 Remote Loop Recorder Transmission

## 2024-05-16 ENCOUNTER — Ambulatory Visit: Payer: Self-pay | Admitting: Cardiology

## 2024-05-23 ENCOUNTER — Other Ambulatory Visit: Payer: Self-pay

## 2024-05-23 MED ORDER — HYDROCHLOROTHIAZIDE 25 MG PO TABS: ORAL_TABLET | ORAL | 1 refills | Status: DC

## 2024-06-12 ENCOUNTER — Encounter

## 2024-06-13 ENCOUNTER — Ambulatory Visit

## 2024-06-13 DIAGNOSIS — I639 Cerebral infarction, unspecified: Secondary | ICD-10-CM | POA: Diagnosis not present

## 2024-06-14 ENCOUNTER — Encounter

## 2024-06-14 LAB — CUP PACEART REMOTE DEVICE CHECK
Date Time Interrogation Session: 20251216232426
Implantable Pulse Generator Implant Date: 20231004

## 2024-06-14 NOTE — Progress Notes (Signed)
 Remote Loop Recorder Transmission

## 2024-06-15 ENCOUNTER — Ambulatory Visit: Payer: Self-pay | Admitting: Cardiology

## 2024-06-18 ENCOUNTER — Ambulatory Visit

## 2024-06-18 DIAGNOSIS — E538 Deficiency of other specified B group vitamins: Secondary | ICD-10-CM | POA: Diagnosis not present

## 2024-06-18 MED ORDER — CYANOCOBALAMIN 1000 MCG/ML IJ SOLN
1000.0000 ug | Freq: Once | INTRAMUSCULAR | Status: AC
  Administered 2024-06-18: 1000 ug via INTRAMUSCULAR

## 2024-06-18 NOTE — Progress Notes (Signed)
 Pt presented for their vitamin B12 injection. Pt was identified through two identifiers. Pt tolerated shot well in their left deltoid.

## 2024-07-13 ENCOUNTER — Encounter

## 2024-07-13 ENCOUNTER — Other Ambulatory Visit (INDEPENDENT_AMBULATORY_CARE_PROVIDER_SITE_OTHER)

## 2024-07-13 DIAGNOSIS — E1169 Type 2 diabetes mellitus with other specified complication: Secondary | ICD-10-CM

## 2024-07-13 DIAGNOSIS — E559 Vitamin D deficiency, unspecified: Secondary | ICD-10-CM

## 2024-07-13 DIAGNOSIS — E7800 Pure hypercholesterolemia, unspecified: Secondary | ICD-10-CM

## 2024-07-13 DIAGNOSIS — E782 Mixed hyperlipidemia: Secondary | ICD-10-CM | POA: Diagnosis not present

## 2024-07-13 LAB — COMPREHENSIVE METABOLIC PANEL WITH GFR
ALT: 25 U/L (ref 3–53)
AST: 23 U/L (ref 5–37)
Albumin: 4.7 g/dL (ref 3.5–5.2)
Alkaline Phosphatase: 59 U/L (ref 39–117)
BUN: 14 mg/dL (ref 6–23)
CO2: 31 meq/L (ref 19–32)
Calcium: 9.6 mg/dL (ref 8.4–10.5)
Chloride: 99 meq/L (ref 96–112)
Creatinine, Ser: 0.91 mg/dL (ref 0.40–1.50)
GFR: 83.53 mL/min
Glucose, Bld: 100 mg/dL — ABNORMAL HIGH (ref 70–99)
Potassium: 4.3 meq/L (ref 3.5–5.1)
Sodium: 138 meq/L (ref 135–145)
Total Bilirubin: 0.7 mg/dL (ref 0.2–1.2)
Total Protein: 7.3 g/dL (ref 6.0–8.3)

## 2024-07-13 LAB — HEMOGLOBIN A1C: Hgb A1c MFr Bld: 7 % — ABNORMAL HIGH (ref 4.6–6.5)

## 2024-07-13 LAB — VITAMIN D 25 HYDROXY (VIT D DEFICIENCY, FRACTURES): VITD: 37.6 ng/mL (ref 30.00–100.00)

## 2024-07-14 ENCOUNTER — Ambulatory Visit: Payer: Self-pay | Admitting: Internal Medicine

## 2024-07-14 ENCOUNTER — Ambulatory Visit: Attending: Cardiology

## 2024-07-14 DIAGNOSIS — I639 Cerebral infarction, unspecified: Secondary | ICD-10-CM | POA: Diagnosis not present

## 2024-07-16 ENCOUNTER — Encounter

## 2024-07-16 ENCOUNTER — Telehealth: Payer: Self-pay | Admitting: Student

## 2024-07-16 ENCOUNTER — Telehealth (HOSPITAL_BASED_OUTPATIENT_CLINIC_OR_DEPARTMENT_OTHER): Payer: Self-pay | Admitting: *Deleted

## 2024-07-16 NOTE — Telephone Encounter (Signed)
" ° °  Name: Robert Robertson.  DOB: 1950/12/22  MRN: [id number]  Primary Cardiologist: Redell Cave, MD   Preoperative team, please contact this patient and set up a phone call appointment for further preoperative risk assessment. Please obtain consent and complete medication review. Thank you for your help.  I confirm that guidance regarding antiplatelet and oral anticoagulation therapy has been completed and, if necessary, noted below.  Plavix  is managed by PCP. Should discuss with them about holding.  Regarding ASA therapy, we recommend continuation of ASA throughout the perioperative period.  However, if the surgeon feels that cessation of ASA is required in the perioperative period, it may be stopped 5-7 days prior to surgery with a plan to resume it as soon as felt to be feasible from a surgical standpoint in the post-operative period.   I also confirmed the patient resides in the state of [place] . As per [facility] Medical Board telemedicine laws, the patient must reside in the state in which the provider is licensed.   Lamarr Satterfield, NP 07/16/2024, 4:19 PM [facility]    "

## 2024-07-16 NOTE — Telephone Encounter (Signed)
 Pt has been scheduled tele preop appt 07/25/24. Med rec and consent are done.

## 2024-07-16 NOTE — Telephone Encounter (Signed)
 On review of chart, patient is on Plavix .  Would surgeon like this held for procedure?

## 2024-07-16 NOTE — Telephone Encounter (Signed)
 I called the GI office and confirmed yes need Plavix  recommendations.

## 2024-07-16 NOTE — Telephone Encounter (Signed)
"  ° °  Pre-operative Risk Assessment    Patient Name: Robert Robertson.  DOB: February 08, 1951 MRN: [id number]   Date of last office visit: 02/29/2024 Date of next office visit: 08/29/2024   Request for Surgical Clearance    Procedure:  Colonoscopy  Date of Surgery:  Clearance 08/10/2024                                Surgeon:  Dr. Onita Socks Group or Practice Name:  [facility] Phone number:  [phone] Fax number:  [phone]   Type of Clearance Requested:   - Medical    Type of Anesthesia:  General    Additional requests/questions:    [name]   07/16/2024, 3:52 PM   "

## 2024-07-16 NOTE — Telephone Encounter (Signed)
 Pt has been scheduled tele preop appt 07/25/24. Med rec and consent are done.      Patient Consent for Virtual Visit        [name]. has provided verbal consent on 07/16/2024 for a virtual visit (video or telephone).   CONSENT FOR VIRTUAL VISIT FOR:  Robert Robertson.  By participating in this virtual visit I agree to the following:  I hereby voluntarily request, consent and authorize [facility] and its employed or contracted physicians, physician assistants, nurse practitioners or other licensed health care professionals (the Practitioner), to provide me with telemedicine health care services (the Services) as deemed necessary by the treating Practitioner. I acknowledge and consent to receive the Services by the Practitioner via telemedicine. I understand that the telemedicine visit will involve communicating with the Practitioner through live audiovisual communication technology and the disclosure of certain medical information by electronic transmission. I acknowledge that I have been given the opportunity to request an in-person assessment or other available alternative prior to the telemedicine visit and am voluntarily participating in the telemedicine visit.  I understand that I have the right to withhold or withdraw my consent to the use of telemedicine in the course of my care at any time, without affecting my right to future care or treatment, and that the Practitioner or I may terminate the telemedicine visit at any time. I understand that I have the right to inspect all information obtained and/or recorded in the course of the telemedicine visit and may receive copies of available information for a reasonable fee.  I understand that some of the potential risks of receiving the Services via telemedicine include:  Delay or interruption in medical evaluation due to technological equipment failure or disruption; Information transmitted may not be sufficient  (e.g. poor resolution of images) to allow for appropriate medical decision making by the Practitioner; and/or  In rare instances, security protocols could fail, causing a breach of personal health information.  Furthermore, I acknowledge that it is my responsibility to provide information about my medical history, conditions and care that is complete and accurate to the best of my ability. I acknowledge that Practitioner's advice, recommendations, and/or decision may be based on factors not within their control, such as incomplete or inaccurate data provided by me or distortions of diagnostic images or specimens that may result from electronic transmissions. I understand that the practice of medicine is not an exact science and that Practitioner makes no warranties or guarantees regarding treatment outcomes. I acknowledge that a copy of this consent can be made available to me via my patient portal [facility] MyChart), or I can request a printed copy by calling the office of [facility].    I understand that my insurance will be billed for this visit.   I have read or had this consent read to me. I understand the contents of this consent, which adequately explains the benefits and risks of the Services being provided via telemedicine.  I have been provided ample opportunity to ask questions regarding this consent and the Services and have had my questions answered to my satisfaction. I give my informed consent for the services to be provided through the use of telemedicine in my medical care

## 2024-07-17 LAB — CUP PACEART REMOTE DEVICE CHECK
Date Time Interrogation Session: 20260116231534
Implantable Pulse Generator Implant Date: 20231004

## 2024-07-19 ENCOUNTER — Ambulatory Visit: Admitting: Internal Medicine

## 2024-07-19 ENCOUNTER — Ambulatory Visit: Payer: Self-pay | Admitting: Cardiology

## 2024-07-19 ENCOUNTER — Ambulatory Visit

## 2024-07-19 ENCOUNTER — Encounter: Payer: Self-pay | Admitting: Internal Medicine

## 2024-07-19 VITALS — BP 114/64 | HR 86 | Ht 68.0 in | Wt 181.0 lb

## 2024-07-19 DIAGNOSIS — E1169 Type 2 diabetes mellitus with other specified complication: Secondary | ICD-10-CM

## 2024-07-19 DIAGNOSIS — E782 Mixed hyperlipidemia: Secondary | ICD-10-CM | POA: Diagnosis not present

## 2024-07-19 DIAGNOSIS — J439 Emphysema, unspecified: Secondary | ICD-10-CM

## 2024-07-19 DIAGNOSIS — E1121 Type 2 diabetes mellitus with diabetic nephropathy: Secondary | ICD-10-CM | POA: Diagnosis not present

## 2024-07-19 DIAGNOSIS — Z7984 Long term (current) use of oral hypoglycemic drugs: Secondary | ICD-10-CM | POA: Diagnosis not present

## 2024-07-19 DIAGNOSIS — L820 Inflamed seborrheic keratosis: Secondary | ICD-10-CM

## 2024-07-19 DIAGNOSIS — Z7985 Long-term (current) use of injectable non-insulin antidiabetic drugs: Secondary | ICD-10-CM | POA: Diagnosis not present

## 2024-07-19 MED ORDER — TRULICITY 0.75 MG/0.5ML ~~LOC~~ SOAJ: SUBCUTANEOUS | 2 refills | Status: AC

## 2024-07-19 MED ORDER — HYDROCHLOROTHIAZIDE 25 MG PO TABS: ORAL_TABLET | ORAL | 1 refills | Status: AC

## 2024-07-19 MED ORDER — METFORMIN HCL 1000 MG PO TABS: 1000.0000 mg | ORAL_TABLET | Freq: Two times a day (BID) | ORAL | 1 refills | Status: AC

## 2024-07-19 NOTE — Progress Notes (Signed)
 Remote Loop Recorder Transmission

## 2024-07-19 NOTE — Progress Notes (Unsigned)
 "  Subjective:  Patient ID: Robert Robertson., male    DOB: 1950-10-18  Age: 74 y.o. MRN: [id number]  CC: There were no encounter diagnoses.   HPI Robert Robertson. presents for  Chief Complaint  Patient presents with   Medical Management of Chronic Issues    1) Type 2 DM   He feels generally well, is exercising several times per week and checking blood sugars once daily at variable times.  BS have been under 130 fasting and < 150 post prandially.  Denies any recent hypoglyemic events.  Taking his medications as directed. Following a carbohydrate modified diet 6 days per week. Denies numbness, burning and tingling of extremities. Appetite is good.      2)   Outpatient Medications Prior to Visit  Medication Sig Dispense Refill   aspirin  EC 81 MG EC tablet Take 1 tablet (81 mg total) by mouth daily. Swallow whole. 30 tablet 11   blood glucose meter kit and supplies USE TO CHECK BLOOD SUGARS UP TO TWO TIMES DAILY 1 each 0   Blood Glucose Monitoring Suppl (FREESTYLE FREEDOM LITE) w/Device KIT 1 application by Does not apply route 2 (two) times daily. 1 kit 0   Ciclopirox  (CICLOPIROX  TREATMENT) 8 % KIT APPLY TWICE A DAY TO NAIL BED AND CUTICLE 34.6 mL 3   clopidogrel  (PLAVIX ) 75 MG tablet TAKE 1 TABLET(75 MG) BY MOUTH DAILY 90 tablet 2   Dulaglutide  (TRULICITY ) 0.75 MG/0.5ML SOAJ inject 0.75 mg into skin once weekly 6 mL 2   FREESTYLE LITE test strip USE TO CHECK SUGARS TWICE A DAY 200 strip 3   hydrochlorothiazide  (HYDRODIURIL ) 25 MG tablet TAKE 1 TABLET(25 MG) BY MOUTH DAILY 90 tablet 1   Krill Oil 300 MG CAPS Take 300 mg by mouth daily.     Lancets (FREESTYLE) lancets USE TO CHECK SUGAR TWICE A DAY 200 each 3   metFORMIN  (GLUCOPHAGE ) 1000 MG tablet Take 1 tablet (1,000 mg total) by mouth 2 (two) times daily with a meal. 180 tablet 1   Multiple Vitamins-Minerals (CENTRUM SILVER ADULT 50+ PO) Take 1 tablet by mouth daily.     rosuvastatin  (CRESTOR ) 40 MG tablet TAKE 1 TABLET(40  MG) BY MOUTH DAILY 90 tablet 3   Telmisartan -amLODIPine  40-10 MG TABS TAKE 1 TABLET AT BEDTIME 90 tablet 3   No facility-administered medications prior to visit.    Review of Systems;  Patient denies headache, fevers, malaise, unintentional weight loss, skin rash, eye pain, sinus congestion and sinus pain, sore throat, dysphagia,  hemoptysis , cough, dyspnea, wheezing, chest pain, palpitations, orthopnea, edema, abdominal pain, nausea, melena, diarrhea, constipation, flank pain, dysuria, hematuria, urinary  Frequency, nocturia, numbness, tingling, seizures,  Focal weakness, Loss of consciousness,  Tremor, insomnia, depression, anxiety, and suicidal ideation.      Objective:  BP 114/64   Pulse 86   Ht 5' 8 (1.727 m)   Wt 181 lb (82.1 kg)   SpO2 97%   BMI 27.52 kg/m   BP Readings from Last 3 Encounters:  07/19/24 114/64  04/12/24 124/66  02/29/24 100/60    Wt Readings from Last 3 Encounters:  07/19/24 181 lb (82.1 kg)  05/02/24 182 lb (82.6 kg)  04/12/24 183 lb 9.6 oz (83.3 kg)    Physical Exam  Lab Results  Component Value Date   HGBA1C 7.0 (H) 07/13/2024   HGBA1C 7.2 (H) 04/10/2024   HGBA1C 7.0 (H) 12/29/2023    Lab Results  Component Value Date  CREATININE 0.91 07/13/2024   CREATININE 0.94 04/10/2024   CREATININE 0.91 12/29/2023    Lab Results  Component Value Date   WBC 7.2 01/27/2023   HGB 11.9 (L) 01/27/2023   HCT 37.6 (L) 01/27/2023   PLT 215.0 01/27/2023   GLUCOSE 100 (H) 07/13/2024   CHOL 98 04/10/2024   TRIG 56.0 04/10/2024   HDL 47.10 04/10/2024   LDLDIRECT 41.0 04/10/2024   LDLCALC 40 04/10/2024   ALT 25 07/13/2024   AST 23 07/13/2024   NA 138 07/13/2024   K 4.3 07/13/2024   CL 99 07/13/2024   CREATININE 0.91 07/13/2024   BUN 14 07/13/2024   CO2 31 07/13/2024   PSA 2.43 09/11/2021   INR 1.0 04/26/2022   HGBA1C 7.0 (H) 07/13/2024   MICROALBUR 1.6 12/29/2023    CT CHEST LUNG CA SCREEN LOW DOSE W/O CM Result Date:  10/14/2023 CLINICAL DATA:  20 pack-year history/quit 9 years ago EXAM: CT CHEST WITHOUT CONTRAST LOW-DOSE FOR LUNG CANCER SCREENING TECHNIQUE: Multidetector CT imaging of the chest was performed following the standard protocol without IV contrast. RADIATION DOSE REDUCTION: This exam was performed according to the departmental dose-optimization program which includes automated exposure control, adjustment of the mA and/or kV according to patient size and/or use of iterative reconstruction technique. COMPARISON:  09/07/2022 FINDINGS: Cardiovascular: Aortic atherosclerosis. Normal heart size, without pericardial effusion. Mediastinum/Nodes: No mediastinal or hilar adenopathy, given limitations of unenhanced CT. Lungs/Pleura: No pleural fluid. Mild centrilobular emphysema. Right lower lobe pulmonary nodule is unchanged at 6.3 mm. Upper Abdomen: Moderate to marked hepatic steatosis. Normal imaged portions of the spleen, stomach, pancreas, adrenal glands, kidneys. Musculoskeletal: Mild-to-moderate convex right thoracic spine curvature. IMPRESSION: 1. Lung-RADS 2, benign appearance or behavior. Continue annual screening with low-dose chest CT without contrast in 12 months. 2. Hepatic steatosis. 3. Aortic Atherosclerosis (ICD10-I70.0) and Emphysema (ICD10-J43.9). Electronically Signed   By: Rockey Kilts M.D.   On: 10/14/2023 12:06    Assessment & Plan:  .There are no diagnoses linked to this encounter.   I spent 34 minutes on the day of this face to face encounter reviewing patient's  most recent visit with cardiology,  nephrology,  and neurology,  prior relevant surgical and non surgical procedures, recent  labs and imaging studies, counseling on weight management,  reviewing the assessment and plan with patient, and post visit ordering and reviewing of  diagnostics and therapeutics with patient  .   Follow-up: No follow-ups on file.   Verneita LITTIE Kettering, MD "

## 2024-07-20 NOTE — Assessment & Plan Note (Addendum)
 His DN is well controlled with  Trulicity  and metformin  .  GFR is stable and   BP is controoled with telmisartan  and proteinuria is stable.  Continue statin, ARB .  SABRA  Lab Results  Component Value Date   MICROALBUR 1.6 12/29/2023   Lab Results  Component Value Date   HGBA1C 7.0 (H) 07/13/2024

## 2024-07-20 NOTE — Assessment & Plan Note (Signed)
 Noted on annual lung CA screenig.  Has has remained tobacco abstinent and remains asymptomatic with  daily participation in aerobic exercise.  He will continue annual screening for lung CA

## 2024-07-23 ENCOUNTER — Ambulatory Visit

## 2024-07-25 ENCOUNTER — Ambulatory Visit: Admitting: Physician Assistant

## 2024-07-25 ENCOUNTER — Encounter: Payer: Self-pay | Admitting: Physician Assistant

## 2024-07-25 DIAGNOSIS — Z01810 Encounter for preprocedural cardiovascular examination: Secondary | ICD-10-CM | POA: Diagnosis not present

## 2024-07-25 NOTE — Telephone Encounter (Signed)
 Notes faxed to surgeon.  Glendia Ferrier, PA-C  07/25/2024 4:37 PM

## 2024-07-25 NOTE — Progress Notes (Signed)
"  ° °  Virtual Visit via Telephone Note for Surgical Clearance    Because of Robert Robertson. co-morbid illnesses, he is at least at moderate risk for complications without adequate follow up.  This format is felt to be most appropriate for this patient at this time.  Due to technical limitations with video connection (technology), today's appointment will be conducted as an audio only telehealth visit, and [name] verbally agreed to proceed in this manner.   All issues noted in this document were discussed and addressed.  No physical exam could be performed with this format.  Evaluation Performed:  Preoperative cardiovascular risk assessment _____________   Date:  07/25/2024   Patient ID:  Robert Robertson., DOB [birth year], MRN [id number]  Patient Location:  Provider location:  Home Office   Primary Care Provider:  Marylynn Verneita CROME, MD Primary Cardiologist:  Redell Cave, MD  Patient Profile  Patent Foramen Ovale Hx of CVA in 2023 Stroke not felt to be due to PFO; ILR implanted TTE 10/06/2021: EF 60-65, + bubble study TEE 12/09/2021: EF 60-65, mild MR, + bubble study Diabetes mellitus Hypertension Hyperlipidemia  History of Present Illness    Robert Robertson. is a 74 y.o. male who presents via audio/video conferencing for a telehealth visit today.   Pt was last seen in cardiology clinic on 02/29/24 by Barnie Hila, NP.  At that time Robert Robertson. was doing well .    Pending Surgery: Colonoscopy Type of anesthesia: General Medications to be held: Plavix  (managed by PCP)  Since his last visit, he has done well without chest pain, shortness of breath or syncope.   Physical Exam  Vital Signs:  [name]. does not have vital signs available for review today. Given telephonic nature of communication, physical exam is limited. AAOx3. NAD. Normal affect.  Speech and respirations are unlabored.  Recent Labs    07/13/24 0807   CREATININE 0.91    Assessment & Plan   Assessment & Plan Preoperative cardiovascular examination [name]'s perioperative risk of a major cardiac event is 0.9% according to the Revised Cardiac Risk Index (RCRI).  Therefore, he is at low risk for perioperative complications.   His functional capacity is good at 4.31 METs according to the Duke Activity Status Index (DASI). Recommendations: According to ACC/AHA guidelines, no further cardiovascular testing needed.  The patient may proceed to surgery at acceptable risk.   Antiplatelet and/or Anticoagulation Recommendations: Plavix  is managed by primary care.  A copy of this note will be routed to requesting surgeon.  Time:   Today, I have spent 5 minutes with the patient with telehealth technology discussing medical history, symptoms, and management plan.     Glendia Ferrier, PA-C 07/25/2024, 1:35 PM  "

## 2024-07-27 ENCOUNTER — Ambulatory Visit

## 2024-07-27 DIAGNOSIS — E538 Deficiency of other specified B group vitamins: Secondary | ICD-10-CM

## 2024-07-27 MED ORDER — CYANOCOBALAMIN 1000 MCG/ML IJ SOLN
1000.0000 ug | Freq: Once | INTRAMUSCULAR | Status: AC
  Administered 2024-07-27: 1000 ug via INTRAMUSCULAR

## 2024-07-27 NOTE — Progress Notes (Cosign Needed Addendum)
Pt received B12 injection in right deltoid muscle. Pt tolerated it well with no complaints or concerns.  

## 2024-07-30 ENCOUNTER — Ambulatory Visit

## 2024-07-31 ENCOUNTER — Encounter: Payer: Self-pay | Admitting: Dermatology

## 2024-07-31 ENCOUNTER — Ambulatory Visit: Admitting: Dermatology

## 2024-07-31 DIAGNOSIS — L821 Other seborrheic keratosis: Secondary | ICD-10-CM | POA: Diagnosis not present

## 2024-07-31 NOTE — Progress Notes (Signed)
" ° °  New Patient Visit   Subjective  Robert Robertson. is a 74 y.o. male who presents for the following: spots at face that are irritated, some will bleed when patient shaves. Patient had similar spots treated many years ago with a solution that was injected and then they dried up and fell off.    The following portions of the chart were reviewed this encounter and updated as appropriate: medications, allergies, medical history  Review of Systems:  No other skin or systemic complaints except as noted in HPI or Assessment and Plan.  Objective  Well appearing patient in no apparent distress; mood and affect are within normal limits.   A focused examination was performed of the following areas: face  Relevant exam findings are noted in the Assessment and Plan.    Assessment & Plan   SEBORRHEIC KERATOSIS - Stuck-on, waxy, tan-brown papules and/or plaques  - Benign-appearing - Discussed benign etiology and prognosis.  - Observe - Call for any changes  - Discussed removal with patient. Treatment with LN2 has risk of hypopigmentation and scar. Shave risk of hypopigmentation and scar. Patient defers   SEBORRHEIC KERATOSES    Return if symptoms worsen or fail to improve.  LILLETTE Lonell Drones, RMA, am acting as scribe for Boneta Sharps, MD .   Documentation: I have reviewed the above documentation for accuracy and completeness, and I agree with the above.  Boneta Sharps, MD    "

## 2024-07-31 NOTE — Patient Instructions (Signed)

## 2024-08-13 ENCOUNTER — Encounter

## 2024-08-14 ENCOUNTER — Ambulatory Visit

## 2024-08-16 ENCOUNTER — Encounter

## 2024-08-17 ENCOUNTER — Ambulatory Visit: Admit: 2024-08-17 | Admitting: Gastroenterology

## 2024-08-27 ENCOUNTER — Ambulatory Visit

## 2024-08-29 ENCOUNTER — Ambulatory Visit: Admitting: Student

## 2024-09-10 ENCOUNTER — Ambulatory Visit

## 2024-09-12 ENCOUNTER — Other Ambulatory Visit

## 2024-09-13 ENCOUNTER — Encounter

## 2024-09-14 ENCOUNTER — Ambulatory Visit

## 2024-09-14 ENCOUNTER — Ambulatory Visit: Admitting: Urology

## 2024-09-17 ENCOUNTER — Encounter

## 2024-10-14 ENCOUNTER — Encounter

## 2024-10-15 ENCOUNTER — Ambulatory Visit

## 2024-10-18 ENCOUNTER — Encounter

## 2025-01-21 ENCOUNTER — Other Ambulatory Visit

## 2025-01-23 ENCOUNTER — Ambulatory Visit: Admitting: Internal Medicine

## 2025-05-07 ENCOUNTER — Ambulatory Visit
